# Patient Record
Sex: Male | Born: 1935 | Race: Black or African American | Hispanic: No | Marital: Married | Smoking: Former smoker
Health system: Southern US, Community
[De-identification: ages and names within clinical notes are randomized; demographics above are authoritative.]

## PROBLEM LIST (undated history)

## (undated) DIAGNOSIS — C499 Malignant neoplasm of connective and soft tissue, unspecified: Principal | ICD-10-CM

## (undated) DIAGNOSIS — I10 Essential (primary) hypertension: Secondary | ICD-10-CM

## (undated) DIAGNOSIS — I509 Heart failure, unspecified: Secondary | ICD-10-CM

## (undated) DIAGNOSIS — E1142 Type 2 diabetes mellitus with diabetic polyneuropathy: Secondary | ICD-10-CM

## (undated) DIAGNOSIS — Z800 Family history of malignant neoplasm of digestive organs: Secondary | ICD-10-CM

## (undated) DIAGNOSIS — E11319 Type 2 diabetes mellitus with unspecified diabetic retinopathy without macular edema: Secondary | ICD-10-CM

## (undated) DIAGNOSIS — I2510 Atherosclerotic heart disease of native coronary artery without angina pectoris: Secondary | ICD-10-CM

## (undated) DIAGNOSIS — R1310 Dysphagia, unspecified: Secondary | ICD-10-CM

## (undated) DIAGNOSIS — L89309 Pressure ulcer of unspecified buttock, unspecified stage: Secondary | ICD-10-CM

## (undated) DIAGNOSIS — K8020 Calculus of gallbladder without cholecystitis without obstruction: Secondary | ICD-10-CM

## (undated) DIAGNOSIS — I739 Peripheral vascular disease, unspecified: Secondary | ICD-10-CM

## (undated) DIAGNOSIS — E538 Deficiency of other specified B group vitamins: Secondary | ICD-10-CM

## (undated) DIAGNOSIS — C6290 Malignant neoplasm of unspecified testis, unspecified whether descended or undescended: Secondary | ICD-10-CM

## (undated) DIAGNOSIS — M869 Osteomyelitis, unspecified: Secondary | ICD-10-CM

## (undated) HISTORY — DX: Malignant neoplasm of connective and soft tissue, unspecified: C49.9

## (undated) HISTORY — DX: Malignant neoplasm of unspecified testis, unspecified whether descended or undescended: C62.90

## (undated) HISTORY — DX: Type 2 diabetes mellitus with diabetic polyneuropathy: E11.42

## (undated) HISTORY — DX: Type 2 diabetes mellitus with unspecified diabetic retinopathy without macular edema: E11.319

## (undated) HISTORY — DX: Peripheral vascular disease, unspecified: I73.9

## (undated) HISTORY — DX: Pressure ulcer of unspecified buttock, unspecified stage: L89.309

## (undated) HISTORY — DX: Deficiency of other specified B group vitamins: E53.8

## (undated) HISTORY — DX: Family history of malignant neoplasm of digestive organs: Z80.0

## (undated) HISTORY — DX: Heart failure, unspecified: I50.9

## (undated) HISTORY — DX: Calculus of gallbladder without cholecystitis without obstruction: K80.20

## (undated) HISTORY — PX: PTCA: SHX146

## (undated) HISTORY — DX: Atherosclerotic heart disease of native coronary artery without angina pectoris: I25.10

## (undated) HISTORY — DX: Osteomyelitis, unspecified: M86.9

## (undated) HISTORY — PX: APPENDECTOMY: SHX54

---

## 1997-11-11 ENCOUNTER — Encounter: Admission: RE | Admit: 1997-11-11 | Discharge: 1997-11-11 | Payer: Self-pay | Admitting: Hematology and Oncology

## 2001-01-08 ENCOUNTER — Encounter: Payer: Self-pay | Admitting: Sports Medicine

## 2001-01-08 ENCOUNTER — Encounter: Payer: Self-pay | Admitting: Emergency Medicine

## 2001-01-08 ENCOUNTER — Inpatient Hospital Stay (HOSPITAL_COMMUNITY): Admission: EM | Admit: 2001-01-08 | Discharge: 2001-01-11 | Payer: Self-pay | Admitting: Emergency Medicine

## 2001-01-10 ENCOUNTER — Encounter: Payer: Self-pay | Admitting: Sports Medicine

## 2001-01-15 ENCOUNTER — Encounter: Admission: RE | Admit: 2001-01-15 | Discharge: 2001-01-15 | Payer: Self-pay | Admitting: Sports Medicine

## 2001-01-22 ENCOUNTER — Encounter: Admission: RE | Admit: 2001-01-22 | Discharge: 2001-01-22 | Payer: Self-pay | Admitting: Family Medicine

## 2001-01-22 ENCOUNTER — Encounter (INDEPENDENT_AMBULATORY_CARE_PROVIDER_SITE_OTHER): Payer: Self-pay

## 2001-01-22 ENCOUNTER — Other Ambulatory Visit: Admission: RE | Admit: 2001-01-22 | Discharge: 2001-01-22 | Payer: Self-pay | Admitting: Urology

## 2001-02-20 ENCOUNTER — Encounter (INDEPENDENT_AMBULATORY_CARE_PROVIDER_SITE_OTHER): Payer: Self-pay

## 2001-02-20 ENCOUNTER — Inpatient Hospital Stay (HOSPITAL_COMMUNITY): Admission: RE | Admit: 2001-02-20 | Discharge: 2001-02-24 | Payer: Self-pay | Admitting: Urology

## 2003-02-11 ENCOUNTER — Encounter: Admission: RE | Admit: 2003-02-11 | Discharge: 2003-02-11 | Payer: Self-pay | Admitting: General Practice

## 2003-02-11 ENCOUNTER — Encounter: Payer: Self-pay | Admitting: General Practice

## 2003-08-09 HISTORY — PX: BELOW KNEE LEG AMPUTATION: SUR23

## 2003-11-09 ENCOUNTER — Emergency Department (HOSPITAL_COMMUNITY): Admission: EM | Admit: 2003-11-09 | Discharge: 2003-11-09 | Payer: Self-pay | Admitting: Emergency Medicine

## 2003-12-03 ENCOUNTER — Ambulatory Visit (HOSPITAL_COMMUNITY): Admission: RE | Admit: 2003-12-03 | Discharge: 2003-12-03 | Payer: Self-pay | Admitting: Vascular Surgery

## 2003-12-03 ENCOUNTER — Encounter: Payer: Self-pay | Admitting: Vascular Surgery

## 2003-12-05 ENCOUNTER — Ambulatory Visit (HOSPITAL_COMMUNITY): Admission: RE | Admit: 2003-12-05 | Discharge: 2003-12-05 | Payer: Self-pay | Admitting: Vascular Surgery

## 2004-03-12 ENCOUNTER — Inpatient Hospital Stay (HOSPITAL_COMMUNITY): Admission: EM | Admit: 2004-03-12 | Discharge: 2004-03-19 | Payer: Self-pay | Admitting: Emergency Medicine

## 2004-03-13 ENCOUNTER — Encounter (INDEPENDENT_AMBULATORY_CARE_PROVIDER_SITE_OTHER): Payer: Self-pay | Admitting: *Deleted

## 2004-03-16 ENCOUNTER — Encounter (INDEPENDENT_AMBULATORY_CARE_PROVIDER_SITE_OTHER): Payer: Self-pay | Admitting: *Deleted

## 2004-03-19 ENCOUNTER — Inpatient Hospital Stay (HOSPITAL_COMMUNITY)
Admission: RE | Admit: 2004-03-19 | Discharge: 2004-03-30 | Payer: Self-pay | Admitting: Physical Medicine & Rehabilitation

## 2004-04-27 ENCOUNTER — Ambulatory Visit (HOSPITAL_COMMUNITY): Admission: RE | Admit: 2004-04-27 | Discharge: 2004-04-27 | Payer: Self-pay | Admitting: Vascular Surgery

## 2004-04-29 ENCOUNTER — Inpatient Hospital Stay (HOSPITAL_COMMUNITY): Admission: RE | Admit: 2004-04-29 | Discharge: 2004-05-04 | Payer: Self-pay | Admitting: Vascular Surgery

## 2004-04-29 ENCOUNTER — Ambulatory Visit: Payer: Self-pay | Admitting: Physical Medicine & Rehabilitation

## 2004-05-04 ENCOUNTER — Inpatient Hospital Stay (HOSPITAL_COMMUNITY)
Admission: RE | Admit: 2004-05-04 | Discharge: 2004-05-12 | Payer: Self-pay | Admitting: Physical Medicine & Rehabilitation

## 2004-05-04 ENCOUNTER — Ambulatory Visit: Payer: Self-pay | Admitting: Physical Medicine & Rehabilitation

## 2004-05-21 ENCOUNTER — Encounter
Admission: RE | Admit: 2004-05-21 | Discharge: 2004-08-19 | Payer: Self-pay | Admitting: Physical Medicine & Rehabilitation

## 2004-05-25 ENCOUNTER — Ambulatory Visit: Payer: Self-pay | Admitting: Physical Medicine & Rehabilitation

## 2004-05-25 ENCOUNTER — Encounter
Admission: RE | Admit: 2004-05-25 | Discharge: 2004-05-25 | Payer: Self-pay | Admitting: Physical Medicine & Rehabilitation

## 2004-07-12 ENCOUNTER — Encounter
Admission: RE | Admit: 2004-07-12 | Discharge: 2004-10-10 | Payer: Self-pay | Admitting: Physical Medicine & Rehabilitation

## 2004-07-27 ENCOUNTER — Ambulatory Visit: Payer: Self-pay | Admitting: Physical Medicine & Rehabilitation

## 2004-08-08 HISTORY — PX: BELOW KNEE LEG AMPUTATION: SUR23

## 2004-10-11 ENCOUNTER — Encounter
Admission: RE | Admit: 2004-10-11 | Discharge: 2004-12-02 | Payer: Self-pay | Admitting: Physical Medicine & Rehabilitation

## 2005-05-03 ENCOUNTER — Inpatient Hospital Stay (HOSPITAL_COMMUNITY): Admission: EM | Admit: 2005-05-03 | Discharge: 2005-05-08 | Payer: Self-pay | Admitting: Emergency Medicine

## 2005-05-10 ENCOUNTER — Emergency Department (HOSPITAL_COMMUNITY): Admission: EM | Admit: 2005-05-10 | Discharge: 2005-05-10 | Payer: Self-pay | Admitting: Emergency Medicine

## 2005-05-18 ENCOUNTER — Inpatient Hospital Stay (HOSPITAL_COMMUNITY): Admission: EM | Admit: 2005-05-18 | Discharge: 2005-05-19 | Payer: Self-pay | Admitting: Emergency Medicine

## 2005-05-20 ENCOUNTER — Inpatient Hospital Stay (HOSPITAL_COMMUNITY): Admission: EM | Admit: 2005-05-20 | Discharge: 2005-05-21 | Payer: Self-pay | Admitting: Emergency Medicine

## 2005-07-04 ENCOUNTER — Ambulatory Visit: Payer: Self-pay | Admitting: Physical Medicine & Rehabilitation

## 2005-07-04 ENCOUNTER — Inpatient Hospital Stay (HOSPITAL_COMMUNITY): Admission: RE | Admit: 2005-07-04 | Discharge: 2005-07-11 | Payer: Self-pay | Admitting: Vascular Surgery

## 2005-07-04 ENCOUNTER — Encounter (INDEPENDENT_AMBULATORY_CARE_PROVIDER_SITE_OTHER): Payer: Self-pay | Admitting: Specialist

## 2005-07-11 ENCOUNTER — Inpatient Hospital Stay (HOSPITAL_COMMUNITY)
Admission: RE | Admit: 2005-07-11 | Discharge: 2005-07-20 | Payer: Self-pay | Admitting: Physical Medicine & Rehabilitation

## 2005-07-11 ENCOUNTER — Ambulatory Visit: Payer: Self-pay | Admitting: Physical Medicine & Rehabilitation

## 2005-11-24 ENCOUNTER — Ambulatory Visit: Payer: Self-pay | Admitting: Family Medicine

## 2005-12-01 ENCOUNTER — Ambulatory Visit: Payer: Self-pay | Admitting: Family Medicine

## 2005-12-15 ENCOUNTER — Ambulatory Visit: Payer: Self-pay | Admitting: Family Medicine

## 2006-01-10 ENCOUNTER — Encounter
Admission: RE | Admit: 2006-01-10 | Discharge: 2006-04-10 | Payer: Self-pay | Admitting: Physical Medicine & Rehabilitation

## 2006-02-21 ENCOUNTER — Ambulatory Visit: Payer: Self-pay | Admitting: Physical Medicine & Rehabilitation

## 2006-02-27 ENCOUNTER — Encounter
Admission: RE | Admit: 2006-02-27 | Discharge: 2006-02-28 | Payer: Self-pay | Admitting: Physical Medicine & Rehabilitation

## 2007-09-11 ENCOUNTER — Encounter
Admission: RE | Admit: 2007-09-11 | Discharge: 2007-09-12 | Payer: Self-pay | Admitting: Physical Medicine & Rehabilitation

## 2007-09-11 ENCOUNTER — Ambulatory Visit: Payer: Self-pay | Admitting: Physical Medicine & Rehabilitation

## 2007-10-04 ENCOUNTER — Encounter
Admission: RE | Admit: 2007-10-04 | Discharge: 2007-12-25 | Payer: Self-pay | Admitting: Physical Medicine & Rehabilitation

## 2007-11-23 ENCOUNTER — Encounter: Payer: Self-pay | Admitting: Gastroenterology

## 2007-12-05 ENCOUNTER — Emergency Department (HOSPITAL_COMMUNITY): Admission: EM | Admit: 2007-12-05 | Discharge: 2007-12-05 | Payer: Self-pay | Admitting: Emergency Medicine

## 2007-12-07 ENCOUNTER — Encounter: Admission: RE | Admit: 2007-12-07 | Discharge: 2007-12-07 | Payer: Self-pay | Admitting: Internal Medicine

## 2007-12-21 DIAGNOSIS — I5022 Chronic systolic (congestive) heart failure: Secondary | ICD-10-CM

## 2007-12-21 DIAGNOSIS — I739 Peripheral vascular disease, unspecified: Secondary | ICD-10-CM

## 2007-12-21 DIAGNOSIS — E119 Type 2 diabetes mellitus without complications: Secondary | ICD-10-CM

## 2007-12-21 DIAGNOSIS — E785 Hyperlipidemia, unspecified: Secondary | ICD-10-CM

## 2007-12-21 DIAGNOSIS — I10 Essential (primary) hypertension: Secondary | ICD-10-CM | POA: Insufficient documentation

## 2007-12-21 DIAGNOSIS — I2510 Atherosclerotic heart disease of native coronary artery without angina pectoris: Secondary | ICD-10-CM | POA: Insufficient documentation

## 2007-12-21 DIAGNOSIS — L97909 Non-pressure chronic ulcer of unspecified part of unspecified lower leg with unspecified severity: Secondary | ICD-10-CM | POA: Insufficient documentation

## 2007-12-21 DIAGNOSIS — K8020 Calculus of gallbladder without cholecystitis without obstruction: Secondary | ICD-10-CM | POA: Insufficient documentation

## 2007-12-21 DIAGNOSIS — E538 Deficiency of other specified B group vitamins: Secondary | ICD-10-CM | POA: Insufficient documentation

## 2007-12-21 DIAGNOSIS — E1149 Type 2 diabetes mellitus with other diabetic neurological complication: Secondary | ICD-10-CM

## 2007-12-25 ENCOUNTER — Ambulatory Visit: Payer: Self-pay | Admitting: Gastroenterology

## 2007-12-25 DIAGNOSIS — K921 Melena: Secondary | ICD-10-CM

## 2007-12-25 DIAGNOSIS — R1012 Left upper quadrant pain: Secondary | ICD-10-CM | POA: Insufficient documentation

## 2007-12-26 ENCOUNTER — Encounter: Admission: RE | Admit: 2007-12-26 | Discharge: 2007-12-26 | Payer: Self-pay | Admitting: Internal Medicine

## 2007-12-28 ENCOUNTER — Encounter: Payer: Self-pay | Admitting: Gastroenterology

## 2007-12-28 ENCOUNTER — Ambulatory Visit: Payer: Self-pay | Admitting: Gastroenterology

## 2007-12-28 ENCOUNTER — Encounter (INDEPENDENT_AMBULATORY_CARE_PROVIDER_SITE_OTHER): Payer: Self-pay | Admitting: *Deleted

## 2008-01-07 ENCOUNTER — Encounter: Payer: Self-pay | Admitting: Gastroenterology

## 2008-01-29 ENCOUNTER — Ambulatory Visit: Payer: Self-pay | Admitting: Gastroenterology

## 2008-01-29 DIAGNOSIS — K2990 Gastroduodenitis, unspecified, without bleeding: Secondary | ICD-10-CM

## 2008-01-29 DIAGNOSIS — K2970 Gastritis, unspecified, without bleeding: Secondary | ICD-10-CM | POA: Insufficient documentation

## 2008-09-10 ENCOUNTER — Telehealth: Payer: Self-pay | Admitting: Gastroenterology

## 2009-07-09 ENCOUNTER — Telehealth: Payer: Self-pay | Admitting: Gastroenterology

## 2010-08-08 HISTORY — PX: EYE SURGERY: SHX253

## 2010-08-29 ENCOUNTER — Encounter: Payer: Self-pay | Admitting: Vascular Surgery

## 2010-08-29 ENCOUNTER — Encounter: Payer: Self-pay | Admitting: Internal Medicine

## 2010-09-07 NOTE — Progress Notes (Signed)
Summary: RX  Phone Note Call from Patient Call back at [phone]   Caller: Spouse Call For: [name]  Reason for Call: Talk to Nurse Details for Reason: RX  Summary of Call: can Dr prescb more pain pill? per pharm no refill needs new RX -Procoxythen... [facility]  [address] #[id number]* Initial call taken by: Guadlupe [facility],  September 10, 2008 10:42 AM  Follow-up for Phone Call        the patient is taking the pain meds for his legs, wife states that Dr. Jarold Motto gve it to her husband because he can not take NSAIDS. Is it ok to refill patient seen last in 01-2008. Follow-up by: Harlow Mares CMA,  September 10, 2008 12:03 PM  Additional Follow-up for Phone Call Additional follow up Details #1::        Darvocet is ok Additional Follow-up by: Mardella Layman MD [facility],  September 10, 2008 12:12 PM    Additional Follow-up for Phone Call Additional follow up Details #2::    pateint wife aware. rx to be faxed. Follow-up by: Harlow Mares CMA,  September 10, 2008 4:40 PM  New/Updated Medications: DARVOCET-N 100 100-650 MG TABS (PROPOXYPHENE N-APAP) take one by mouth every 6 hours as needed   Prescriptions: DARVOCET-N 100 100-650 MG TABS (PROPOXYPHENE N-APAP) take one by mouth every 6 hours as needed  #180 x 0   Entered by:   Harlow Mares CMA   Authorized by:   Mardella Layman MD FACG,FAGA   Signed by:   Harlow Mares CMA on 09/10/2008   Method used:   Printed then faxed to ...       [facility]  [address] [phone]* (retail)       [address]       [place], [place]  [address]       Ph: [phone] or [phone]       Fax: [phone]   RxID:   [id number]

## 2010-10-20 ENCOUNTER — Other Ambulatory Visit: Payer: Self-pay | Admitting: Urology

## 2010-10-20 ENCOUNTER — Encounter (HOSPITAL_COMMUNITY): Payer: Medicare Other

## 2010-10-20 ENCOUNTER — Ambulatory Visit (HOSPITAL_COMMUNITY)
Admission: RE | Admit: 2010-10-20 | Discharge: 2010-10-20 | Disposition: A | Discharge status: Home / Self Care | Payer: Medicare Other | Source: Ambulatory Visit | Attending: Urology | Admitting: Urology

## 2010-10-20 DIAGNOSIS — E119 Type 2 diabetes mellitus without complications: Secondary | ICD-10-CM | POA: Insufficient documentation

## 2010-10-20 DIAGNOSIS — Z01818 Encounter for other preprocedural examination: Secondary | ICD-10-CM | POA: Insufficient documentation

## 2010-10-20 DIAGNOSIS — I10 Essential (primary) hypertension: Secondary | ICD-10-CM | POA: Insufficient documentation

## 2010-10-20 DIAGNOSIS — Z87891 Personal history of nicotine dependence: Secondary | ICD-10-CM | POA: Insufficient documentation

## 2010-10-20 DIAGNOSIS — Z01812 Encounter for preprocedural laboratory examination: Secondary | ICD-10-CM | POA: Insufficient documentation

## 2010-10-20 DIAGNOSIS — J9819 Other pulmonary collapse: Secondary | ICD-10-CM | POA: Insufficient documentation

## 2010-10-20 LAB — CBC
HCT: 43 % (ref 39.0–52.0)
MCHC: 32.6 g/dL (ref 30.0–36.0)
MCV: 96.4 fL (ref 78.0–100.0)
Platelets: 206 10*3/uL (ref 150–400)
RDW: 13.4 % (ref 11.5–15.5)
WBC: 8.8 10*3/uL (ref 4.0–10.5)

## 2010-10-20 LAB — BASIC METABOLIC PANEL
BUN: 16 mg/dL (ref 6–23)
Calcium: 8.4 mg/dL (ref 8.4–10.5)
GFR calc non Af Amer: >60 mL/min (ref >60)
Glucose, Bld: 117 mg/dL — ABNORMAL HIGH (ref 70–99)
Potassium: 3.9 mEq/L (ref 3.5–5.1)
Sodium: 140 mEq/L (ref 135–145)

## 2010-10-20 LAB — SURGICAL PCR SCREEN: Staphylococcus aureus: POSITIVE — AB

## 2010-11-02 ENCOUNTER — Observation Stay (HOSPITAL_COMMUNITY)
Admission: RE | Admit: 2010-11-02 | Discharge: 2010-11-03 | Disposition: A | Discharge status: Home / Self Care | Payer: Medicare Other | Source: Ambulatory Visit | Attending: Urology | Admitting: Urology

## 2010-11-02 ENCOUNTER — Other Ambulatory Visit: Payer: Self-pay | Admitting: Urology

## 2010-11-02 DIAGNOSIS — I10 Essential (primary) hypertension: Secondary | ICD-10-CM | POA: Insufficient documentation

## 2010-11-02 DIAGNOSIS — R599 Enlarged lymph nodes, unspecified: Secondary | ICD-10-CM | POA: Insufficient documentation

## 2010-11-02 DIAGNOSIS — Z79899 Other long term (current) drug therapy: Secondary | ICD-10-CM | POA: Insufficient documentation

## 2010-11-02 DIAGNOSIS — E119 Type 2 diabetes mellitus without complications: Secondary | ICD-10-CM | POA: Insufficient documentation

## 2010-11-02 DIAGNOSIS — I2510 Atherosclerotic heart disease of native coronary artery without angina pectoris: Secondary | ICD-10-CM | POA: Insufficient documentation

## 2010-11-02 DIAGNOSIS — N508 Other specified disorders of male genital organs: Secondary | ICD-10-CM | POA: Insufficient documentation

## 2010-11-02 DIAGNOSIS — S88919A Complete traumatic amputation of unspecified lower leg, level unspecified, initial encounter: Secondary | ICD-10-CM | POA: Insufficient documentation

## 2010-11-02 DIAGNOSIS — I252 Old myocardial infarction: Secondary | ICD-10-CM | POA: Insufficient documentation

## 2010-11-02 DIAGNOSIS — Z7982 Long term (current) use of aspirin: Secondary | ICD-10-CM | POA: Insufficient documentation

## 2010-11-02 DIAGNOSIS — Z794 Long term (current) use of insulin: Secondary | ICD-10-CM | POA: Insufficient documentation

## 2010-11-02 DIAGNOSIS — I70209 Unspecified atherosclerosis of native arteries of extremities, unspecified extremity: Secondary | ICD-10-CM | POA: Insufficient documentation

## 2010-11-02 DIAGNOSIS — K219 Gastro-esophageal reflux disease without esophagitis: Secondary | ICD-10-CM | POA: Insufficient documentation

## 2010-11-02 DIAGNOSIS — E669 Obesity, unspecified: Secondary | ICD-10-CM | POA: Insufficient documentation

## 2010-11-02 DIAGNOSIS — C632 Malignant neoplasm of scrotum: Principal | ICD-10-CM | POA: Insufficient documentation

## 2010-11-02 DIAGNOSIS — Z01812 Encounter for preprocedural laboratory examination: Secondary | ICD-10-CM | POA: Insufficient documentation

## 2010-11-02 LAB — GLUCOSE, CAPILLARY
Glucose-Capillary: 148 mg/dL — ABNORMAL HIGH (ref 70–99)
Glucose-Capillary: 162 mg/dL — ABNORMAL HIGH (ref 70–99)

## 2010-11-03 LAB — GLUCOSE, CAPILLARY

## 2010-11-09 NOTE — Op Note (Signed)
[name], [name]               ACCOUNT NO.:  [id number]  MEDICAL RECORD NO.:  [id number]           PATIENT TYPE:  O  LOCATION:  1433                         FACILITY:  [facility]  PHYSICIAN:  Excell Seltzer. Annabell Howells, M.D.    DATE OF BIRTH:  [birth year]  DATE OF PROCEDURE:  11/03/2010 DATE OF DISCHARGE:                              OPERATIVE REPORT   PROCEDURE:  Right scrotal and inguinal exploration with right radical orchiectomy and right inguinal lymphadenectomy.  PREOPERATIVE DIAGNOSIS:  Right scrotal mass.  POSTOPERATIVE DIAGNOSIS:  Right scrotal mass with right inguinal adenopathy.  SURGEON:  Excell Seltzer. Annabell Howells, M.D.  ANESTHESIA:  General.  SPECIMEN:  Right scrotal mass, testicle, and enlarged right inguinal lymph node.  DRAINS:  None.  BLOOD LOSS:  25 cc.  COMPLICATIONS:  None.  INDICATIONS:  [name] is a 77 year old African-American male who was sent for enlarging right scrotal mass that began 2 months ago.  On CT, he was found to have a fatty mass in the scrotum with the testicle adjacent.  There was also a solid-appearing mass in the upper scrotum/inguinal area.  It was felt the resection was indicated.  FINDINGS AND PROCEDURE:  He was taken to the operating room where general anesthetic was induced.  His genitalia was clipped.  He was prepped with ChloraPrep on the lower abdomen and thighs and Betadine on the genitals.  He was then draped in usual sterile fashion.  Time-out was performed.  He had received Ancef for the procedure.  PAS hose were not placed since she is a bilateral amputee.  A right inguinal incision was made and this was extended down on to the anterior scrotum.  Once incision was made, the Bovie was used to open the subcutaneous tissues. The right scrotal mass was densely adherent to the surrounding tissues and basically had to carve it out to free it up.  On palpation in the inguinal area, there was a very large, approximately 6 cm, mass consistent  with adenopathy.  Once the scrotal mass had been freed from the surrounding scrotal tissues, I was able to elevate the testicle and dissect posterior to the inguinal mass.  Once the inguinal mass and cord were dissected back to the internal ring, the external oblique fascia was not readily visualized because of the adherence of the mass to the surrounding tissues.  The ilioinguinal nerve was never identified, but once the mass was elevated, I was able to determine that the lymph node was adjacent to and not involving the actual cord and the testicle was actually adjacent to the mass as well.  However, due to the adherence, it was felt that a radical orchiectomy needed to be performed.  The pedicle was dissected out, divided into 2 packets, clamped and divided. I was then able to get a right-angle clamp beneath the lymph node and clamped the lymphatic channels.  The lymph node was then released from the inguinal floor and the specimen was removed.  Suture ligature with 2- 0 Vicryl followed by 0 Vicryl free ties were used to control the vascular and lymphatic pedicles.  Once the sutures were  in place, the inguinal canal was inspected and what appeared to be the external oblique fascia was reapproximated using a running 3-0 Vicryl suture. The scrotum was inspected.  A few small bleeders were cauterized.  Once hemostasis was assured, the dartos was closed using a running 3-0 chromic suture.  Interrupted 3-0 chromic were used to close the subcutaneous tissue and the inguinal component.  Interrupted vertical mattress 3-0 chromic was used to close the scrotal skin.  This was followed by skin clips in the inguinal area.  A dressing of 4x4s and a scrotal support was placed.  The patient's anesthetic was reversed.  He was moved to the recovery room in stable condition.  There were no complications.     Excell Seltzer. Annabell Howells, M.D.     JJW/MEDQ  D:  11/03/2010  T:  11/03/2010  Job:  [id number]  cc:    [name], M.D. Fax: [phone]  Electronically Signed by Bjorn Pippin M.D. on 11/09/2010 07:08:57 PM

## 2010-11-14 ENCOUNTER — Emergency Department (HOSPITAL_COMMUNITY)
Admission: EM | Admit: 2010-11-14 | Discharge: 2010-11-14 | Disposition: A | Discharge status: Home / Self Care | Payer: Medicare Other | Attending: Emergency Medicine | Admitting: Emergency Medicine

## 2010-11-14 ENCOUNTER — Emergency Department (HOSPITAL_COMMUNITY): Payer: Medicare Other

## 2010-11-14 DIAGNOSIS — I10 Essential (primary) hypertension: Secondary | ICD-10-CM | POA: Insufficient documentation

## 2010-11-14 DIAGNOSIS — S3130XA Unspecified open wound of scrotum and testes, initial encounter: Secondary | ICD-10-CM | POA: Insufficient documentation

## 2010-11-14 DIAGNOSIS — Z9079 Acquired absence of other genital organ(s): Secondary | ICD-10-CM | POA: Insufficient documentation

## 2010-11-14 DIAGNOSIS — E119 Type 2 diabetes mellitus without complications: Secondary | ICD-10-CM | POA: Insufficient documentation

## 2010-11-14 DIAGNOSIS — Z794 Long term (current) use of insulin: Secondary | ICD-10-CM | POA: Insufficient documentation

## 2010-11-14 DIAGNOSIS — N501 Vascular disorders of male genital organs: Secondary | ICD-10-CM | POA: Insufficient documentation

## 2010-11-14 LAB — DIFFERENTIAL
Basophils Absolute: 0 10*3/uL (ref 0.0–0.1)
Eosinophils Relative: 2 % (ref 0–5)
Lymphocytes Relative: 16 % (ref 12–46)
Lymphs Abs: 1.6 10*3/uL (ref 0.7–4.0)
Monocytes Absolute: 0.6 10*3/uL (ref 0.1–1.0)
Monocytes Relative: 6 % (ref 3–12)
Neutro Abs: 7.5 10*3/uL (ref 1.7–7.7)

## 2010-11-14 LAB — CBC
HCT: 36.6 % — ABNORMAL LOW (ref 39.0–52.0)
Hemoglobin: 12.1 g/dL — ABNORMAL LOW (ref 13.0–17.0)
MCH: 31.6 pg (ref 26.0–34.0)
MCHC: 33.1 g/dL (ref 30.0–36.0)
MCV: 95.6 fL (ref 78.0–100.0)
RDW: 13 % (ref 11.5–15.5)

## 2010-11-14 LAB — BASIC METABOLIC PANEL
BUN: 10 mg/dL (ref 6–23)
CO2: 26 mEq/L (ref 19–32)
Chloride: 101 mEq/L (ref 96–112)
GFR calc non Af Amer: >60 mL/min (ref >60)
Glucose, Bld: 317 mg/dL — ABNORMAL HIGH (ref 70–99)
Potassium: 3.7 mEq/L (ref 3.5–5.1)
Sodium: 135 mEq/L (ref 135–145)

## 2010-12-02 ENCOUNTER — Other Ambulatory Visit: Payer: Self-pay | Admitting: Oncology

## 2010-12-02 ENCOUNTER — Encounter (HOSPITAL_BASED_OUTPATIENT_CLINIC_OR_DEPARTMENT_OTHER): Payer: Medicare Other | Admitting: Oncology

## 2010-12-02 DIAGNOSIS — E119 Type 2 diabetes mellitus without complications: Secondary | ICD-10-CM

## 2010-12-02 DIAGNOSIS — C6290 Malignant neoplasm of unspecified testis, unspecified whether descended or undescended: Secondary | ICD-10-CM

## 2010-12-02 DIAGNOSIS — Z794 Long term (current) use of insulin: Secondary | ICD-10-CM

## 2010-12-02 DIAGNOSIS — I10 Essential (primary) hypertension: Secondary | ICD-10-CM

## 2010-12-02 LAB — CBC WITH DIFFERENTIAL/PLATELET
BASO%: 0.6 % (ref 0.0–2.0)
Basophils Absolute: 0 10*3/uL (ref 0.0–0.1)
EOS%: 2.7 % (ref 0.0–7.0)
HCT: 35.9 % — ABNORMAL LOW (ref 38.4–49.9)
HGB: 12.1 g/dL — ABNORMAL LOW (ref 13.0–17.1)
LYMPH%: 22.6 % (ref 14.0–49.0)
MCH: 32.4 pg (ref 27.2–33.4)
MCHC: 33.7 g/dL (ref 32.0–36.0)
NEUT%: 67.9 % (ref 39.0–75.0)
Platelets: 262 10*3/uL (ref 140–400)
lymph#: 1.7 10*3/uL (ref 0.9–3.3)

## 2010-12-04 LAB — COMPREHENSIVE METABOLIC PANEL
ALT: <8 U/L (ref 0–53)
BUN: 14 mg/dL (ref 6–23)
CO2: 23 mEq/L (ref 19–32)
Calcium: 8.5 mg/dL (ref 8.4–10.5)
Chloride: 103 mEq/L (ref 96–112)
Creatinine, Ser: 1.19 mg/dL (ref 0.40–1.50)
Total Bilirubin: 0.4 mg/dL (ref 0.3–1.2)

## 2010-12-04 LAB — LACTATE DEHYDROGENASE: LDH: 143 U/L (ref 94–250)

## 2010-12-04 LAB — BETA HCG QUANT (REF LAB): Beta hCG, Tumor Marker: 0.6 m[IU]/mL (ref <5.0)

## 2010-12-06 ENCOUNTER — Encounter: Payer: Medicare Other | Admitting: Oncology

## 2010-12-21 NOTE — Assessment & Plan Note (Signed)
DATE OF EVALUATION:  09/12/2007   Mr. Edmonston returned to the clinic today requested by [facility].  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  We last saw  him in this office February 22, 2006.  At that time he planned to move to  [place] to live with his niece.  He did attempt to do that, but  found out that she was on drugs and that did not work out.  He returned  to live with his wife locally.   The patient has had his present prosthesis for at least 4 to 5 years.  He has poorly fitting prostheses bilaterally for the below knee  amputation sites.  He does use a manual wheelchair, and that also needs  to be replaced at present secondary to wear and tear.  He reports that  he ambulates occasionally throughout the day, but his wife reports that  he ambulates approximately monthly.  In spite of that, he is able to  stand and ambulate with [place] in the office today.  To get to the standing  position, he required 2+ moderate assist, but to ambulate he required 1+  min-assist.   ALLERGIES:  GLUCOPHAGE.   MEDICATIONS:  1. Zocor 40 mg daily.  2. Lasix 20 mg daily.  3. Glipizide 10 mg b.i.d.  4. Aspirin 325 mg daily.  5. Coreg 6.25 mg b.i.d.  6. Lisinopril 10 mg nightly.  7. Aldactone 25 mg nightly.  8. Plavix 75 mg daily.   REVIEW OF SYSTEMS:  Positive for high blood sugar.   PHYSICAL EXAMINATION:  Well appearing, large adult male seated in manual  wheelchair.  Blood pressure is 154/62 with pulse 79, respiratory rate  18, and O2 saturation 95% on room air.  He has 5-/5 strength in the  bilateral upper extremities and 4+/5 strength in hip flexion, knee  extension bilaterally.  Patient was able to ambulate as noted above  short distances.   IMPRESSION:  Status post bilateral below knee amputations with poorly  fitting prostheses  at present.   At the present time, we will set the patient up to see Trey Paula at [facility] for refitting for his bilateral below knee prostheses.  He  will be a K1 ambulator, i.e. household ambulation.  We have encouraged  outpatient therapies once he has his new prostheses.  We also gave him a  slip for a lightweight wheelchair to be obtained through [facility].  We will plan on seeing the patient in followup on an as needed  basis.           ______________________________  Ellwood Dense, M.D.     DC/MedQ  D:  09/12/2007 10:51:07  T:  09/12/2007 16:23:15  Job #:  [id number]

## 2010-12-24 NOTE — Op Note (Signed)
[name], [name] NO.:  [id number]   MEDICAL RECORD NO.:  [id number]          PATIENT TYPE:  INP   LOCATION:  2028                         FACILITY:  [facility]   PHYSICIAN:  Janetta Hora. Fields, MD  DATE OF BIRTH:  [birth year]   DATE OF PROCEDURE:  DATE OF DISCHARGE:                                 OPERATIVE REPORT   PROCEDURE:  Left below-knee amputation.   PREOPERATIVE DIAGNOSIS:  Gangrene left foot.   POSTOPERATIVE DIAGNOSIS:  Gangrene left foot.   ANESTHESIA:  General.   ASSISTANT:  Delight Hoh, RN   INDICATIONS:  The patient is a 77 year old male with unreconstructable  tibial artery occlusive disease. He has gangrene of the left foot.   OPERATIVE FINDINGS:  1.  Gangrene left leg.  2.  Severe tibial artery occlusive disease.  3.  Healthy bleeding below-knee tissues.   OPERATIVE DETAIL:  After obtaining informed consent, the patient was taken  to the operating room. The patient was placed in the supine position on the  operating table. After induction of general anesthesia and endotracheal  intubation, the Foley catheter was placed. Next the patient's left lower  extremity was prepped and draped in the usual sterile fashion. A transverse  incision was made approximately 4 fingerbreadths below the tibial  tuberosity. This was carried approximately halfway to the posterior portion  of the leg; and then extended towards the foot to create a posterior flap.  The skin incision was completed circumferentially.   Next, the fascia was incised with the cautery. The muscles were taken down  on the anterior compartment with cautery. The periosteum was then freed  circumferentially approximately 4 cm above the skin edge on the tibia. The  fibula was also dissected free with the periosteal elevator approximately 2  cm higher than this. The fibula was then divided with a bone cutter. The  tibia was divided with a saw. The anterior edge of the tibia was beveled  with a rasp. The tibia was then elevated up into the air with a bone hook,  and a posterior flap was created with an amputation knife. Hemostasis was  obtained with several silk ties and suture ligatures.   Next the posterior flap was fashioned so that it would fit well against the  anterior portion of the leg without tension. Fascia was reapproximated with  interrupted 2-0 Vicryl sutures. Subcutaneous tissues reapproximated with a  running  3-0 Vicryl stitch. Skin was closed with staples. The patient tolerated  procedure well and there were no complications.  Instrument, sponge, and  needle counts were correct at the end of the case.  The patient taken to the  recovery room in stable condition.           ______________________________  Janetta Hora Fields, MD     CEF/MEDQ  D:  07/04/2005  T:  07/04/2005  Job:  [id number]

## 2010-12-24 NOTE — H&P (Signed)
[name], [name] NO.:  [id number]   MEDICAL RECORD NO.:  [id number]          PATIENT TYPE:  INP   LOCATION:  1824                         FACILITY:  [facility]   PHYSICIAN:  Ulyses Amor, MD DATE OF BIRTH:  09-12-1932   DATE OF ADMISSION:  05/18/2005  DATE OF DISCHARGE:                                HISTORY & PHYSICAL   IDENTIFYING DATA AND CHIEF COMPLAINT:  [name] is a 77 year old black  man who is admitted to [facility] for further evaluation of chest  pain.   HISTORY OF THE PRESENT ILLNESS:  The patient has a history of cardiac  disease which dates back approximately two weeks. At that time he presented  with an acute posterior myocardial infarction.  He subsequently underwent  cardiac catheterization, which demonstrated significant three-vessel  disease.  A stent was placed to the first obtuse marginal, which was felt to  be the culprit vessel.  It was noted at that time the left ventricular  function was severely depressed with an ejection fraction estimated to be in  the range of 22-25%.   The patient returns to the emergency department tonight after experiencing  an episode of chest pain, which began last evening while he was watching  television.  The chest pain is exactly the same, which heralded his acute  myocardial infarction.  The chest pain is described as a lower substernal  pressure.  It radiated to his throat.  It is associated with dyspnea,  diaphoresis and nausea.  There are no exacerbating or ameliorating factors.  It appears not be related to position, activity, meals or respirations.  He  took one nitroglycerin tablet, which afforded him no relief.  He then  presented to the emergency department.  His chest pain has since resolved  with the treatment rendered.  He total duration of the chest pain was  appendectomy two hours.  The patient is free of chest pain and otherwise  asymptomatic at this time.   PAST MEDICAL  HISTORY:  The patient has a number of other medical problems.  He has diabetes mellitus. He also has hypertension and dyslipidemia.  He has  a history of peripheral vascular disease, having undergone left fem-pop  bypass and a right below the knee amputation.  He also has diabetic  neuropathy and diabetic retinopathy.   MEDICATIONS:  Digoxin, spironolactone, Plavix, Coreg, Zocor, Maxzide,  Glucotrol, lisinopril, and nitroglycerin.   ALLERGIES:  None.   PAST SURGICAL HISTORY:  1.  Left fem-pop bypass.  2.  Below the knee amputation.  3.  Appendectomy.  4.  Transurethral resection of the prostate.   SOCIAL HISTORY:  The patient does not work.  He lives with his wife.  He  does not drink.  There is a past __________.   Review of systems reveals no new problems related to her head, eyes, ears,  nose, mouth, throat, lungs, gastrointestinal system, genitourinary system,  or extremities.  There is no history of neurologic or psychiatric disorder.  There is no history of fever, chills or weight loss.   FAMILY HISTORY:  The family history is notable for diabetes mellitus in his  mother and __________ .   PHYSICAL EXAMINATION:  VITAL SIGNS:  Blood pressure 101/63, pulse 110 and  regular, respirations 18 and temperature 97.6.  GENERAL APPEARANCE:  The patient is an elderly black man in no discomfort.  He is alert, oriented and appropriate.  HEENT:  Head, eyes, nose and mouth are unremarkable.  NECK:  The neck is without thyromegaly or adenopathy.  Carotid pulses are  palpable bilaterally and without bruits.  HEART:  Cardiac examination reveals an normal S1 and S2.  There are no S3,  S4, murmur, rub or click.  Cardiac rhythm is regular.  CHEST:  No chest wall tenderness is noted.  LUNGS:  The lungs are clear.  ABDOMEN:  The abdomen is soft and nontender.  There is no mass,  hepatosplenomegaly, bruits, distention, rebound, guarding, or rigidity.  Bowel sounds are normal.  RECTAL AND  GENITALIA:  Rectal and genital examinations are not performed as  they are not pertinent to the reason for acute care hospitalization.  EXTREMITIES:  A right below the knee amputation is present.  The left foot  is mildly edematous.  Pulse cannot be palpated in the left foot.  NEUROLOGIC EXAMINATION:  Brief screening neurologic examination demonstrated  no focal neurologic findings.   LABORATORY DATA:  The electrocardiogram reveals evidence of a prior lateral  and inferoposterior myocardial infarctions. It is not significantly changed  since the prior tracing.  The chest radiograph report is pending at the time  of this dictation.  Potassium is 5.5 and BUN 21.  Initial cardiac markers  revealed a myoglobin of 193, CK/MB 1.5 and troponin less than 0.05.  The  second set of markers revealed a myoglobin of 115, CK/MB 1.5 and troponin  less than 0.05. The remaining studies are pending at the time of this  dictation.   IMPRESSION:  1.  Chest pain, rule out unstable angina.  2.  Coronary artery disease two weeks status post acute posterior myocardial      infarction resulting in stenting of the first obtuse marginal.  There      was residual significant three-vessel disease.  3.  Severe left ventricular dysfunction; ejection fraction estimated to  be      in the range of 20-25%.  4.  Hypertension.  5.  Diabetes mellitus.  6.  Peripheral vascular occlusive disease status post right below the knee      amputation.  7.  Status post left femoropopliteal bypass.  8.  Dyslipidemia.  9.  Diabetic neuropathy.  10. Diabetic retinopathy.   PLAN:  1.  Telemetry.  2.  Serial cardiac enzymes.  3.  Aspirin.  4.  Intravenous heparin.  5.  Intravenous nitroglycerin.  6.  Continue Plavix.  7.  Further measures per Dr. Jenne Campus.      Ulyses Amor, MD  Electronically Signed     MSC/MEDQ  D:  05/18/2005  T:  05/18/2005  Job:  [id number]

## 2010-12-24 NOTE — Discharge Summary (Signed)
[name], [name] NO.:  [id number]   MEDICAL RECORD NO.:  [id number]          PATIENT TYPE:  IPS   LOCATION:  4032                         FACILITY:  [facility]   PHYSICIAN:  [name], M.D.DATE OF BIRTH:  10-Apr-1933   DATE OF ADMISSION:  07/11/2005  DATE OF DISCHARGE:                                 DISCHARGE SUMMARY   ANTICIPATED DATE OF DISCHARGE:  July 20, 2005.   DISCHARGE DIAGNOSES:  1.  Left below knee amputation, July 04, 2005, secondary to dry      gangrene.  2.  Pain management.  3.  Subcutaneous heparin for deep vein thrombosis prophylaxis.  4.  Postoperative anemia.  5.  Renal insufficiency, resolved.  6.  Insulin-dependent diabetes mellitus.  7.  Hypertension.  8.  Hyperlipidemia.  9.  Right below knee amputation, August 2005.   HISTORY OF THE PRESENT ILLNESS:  This is a 77 year old black male with a  history of right below knee amputation in August 2005,  he received  inpatient rehab services as well as a left lower extremity femoropopliteal  bypass in September 2005 at which time he received subacute care in rehab  services.  He is now admitted July 04, 2005 with gangrene of the left  foot, with no change with conservative care and an unreconstructable tibial  artery.  The patient underwent left below knee amputation on July 04, 2005 per Dr. Darrick Penna. Subcutaneous heparin was added for deep vein thrombosis  prophylaxis.  The PCA was discontinued on July 09, 2005.  He is admitted  for a comprehensive rehab program.   PAST MEDICAL HISTORY:  For the past medical history please see the discharge  diagnoses.   HABITS:  No alcohol.  He does have a history of tobacco use.   ALLERGIES:  The patient's allergies are GLUCOPHAGE.   SOCIAL HISTORY:  The patient lives with his wife.  The wife can assist,  except for no lifting.  Local son works.  Patient lives in a one-level home  with three steps to entry.   MEDICATIONS:  The patient's medications prior to admission:  1.  Zocor 40 mg daily.  2.  Lasix 20 mg daily.  3.  Glipizide 10 mg twice a day.  4.  Aspirin 325 mg daily,  5.  Coreg 6.25 mg twice a day.  6.  Lisinopril 10 mg a bedtime.  7.  Aldactone 25 mg at bedtime.  8.  Plavix 75 mg daily.   HOSPITAL COURSE:  The patient with progressive gains while on rehab services  with therapies initiated om twice a day basis.  The following issues were  followed during the patient's rehab course.   Pertaining to [name]'s left below knee amputation of July 04, 2005;  the surgical site was healing nicely.  The staples were intact with the  ultimate plan to follow up with Dr. Darrick Penna of cardiothoracic surgery.  Pain  management was ongoing with the use of oxycodone and good results.  He  remained on subcutaneous heparin for deep vein thrombosis prophylaxis.  There was a noted history  of right below knee amputation in August 2005.  He  was using a prosthesis for his ambulation.   Postoperative anemia was stable with the latest hemoglobin of 8.8 and  hematocrit of 26.  His blood pressure was monitored.  He did have some mild  acute renal insufficiency with BUN of 44 and creatinine of 1.6.  He was  placed on intravenous fluids on July 13, 2005.  His lisinopril and Lasix  were on hold July 12, 2005.  Intravenous fluids were discontinued on  July 15, 2005 with BUN improving to 26 and creatinine to 1.4.  His  aldactone was decreased to 12.5 mg daily on July 18, 2005 and monitored  with follow up chemistries.  His blood sugars did have some increased  variables; 284, 80, 92 and 154.   On his admission to rehab services the patient was on glipizide 10 mg twice  a day as well as Lantus insulin; this was discontinued and he was placed on  NPH insulin of 10 units daily on July 18, 2005 as well as providing  family teaching for his diabetes at this time.  He would be on indulin   therapy at the time of discharge with ongoing issues to control his diet.  Overall for his functional status he is presently propelling his wheelchair  at supervision level, simple setup for activities of daily living and  minimal-to-moderate assistance to lower body.  He is continent of bowel and  bladder and he is minimal-to moderate assistance for bed-to-chair transfers.   The patient will be discharged to home with therapies.   DISCHARGE MEDICATIONS:  Discharge medications at the time of dictation  include:  1.  Glipizide 10 ,g twice a day.  2.  Zocor 40 mg daily.  3.  Ecotrin 325 mg daily.  4.  Coreg 6.25 mg twice a day.  5.  Plavix 75 mg daily.  6.  Nu-Iron 150 mg daily.  7.  Folic acid 1 mg daily.  8.  Aldactone 12,5 mg daily.  9.  NPH insulin10 units daily in the A.M.  10. Oxycodone as needed for breakthrough pain.  11. The patient's Lasix will remain on hold at 20 mg daily and lisinopril 10      mg daily also on hold as his renal function continues to improve.   DIET:  The patient's diet is regular.   WOUND CARE:  For wound care the patient is to follow up with Dr. Darrick Penna of  cardiovascular thoracic surgery for removal of staples in two to three week.  Arrangements will be made for him to follow up in the [facility].      Mariam Dollar, P.A.      [name], M.D.  Electronically Signed    DA/MEDQ  D:  07/19/2005  T:  07/20/2005  Job:  [id number]   cc:   [name] Hora. Fields, MD  [address], [place] [address]   [name], M.D.  Fax: [phone]

## 2010-12-24 NOTE — Consult Note (Signed)
[name], HOCKETT NO.:  [id number]   MEDICAL RECORD NO.:  [id number]          PATIENT TYPE:  INP   LOCATION:  2915                         FACILITY:  [facility]   PHYSICIAN:  [name], M.D.  DATE OF BIRTH:  March 19, 1933   DATE OF CONSULTATION:  05/04/2005  DATE OF DISCHARGE:                                   CONSULTATION   REASON FOR CONSULTATION:  Medical management of diabetes.   HISTORY OF PRESENT ILLNESS:  I greatly appreciate the cardiology response  and management in Mr. Macho's care.  He is a 77 year old African American  male with no prior history of coronary artery disease but does have severe  peripheral vascular disease status post right BKA and left fem/pop bypass  (September 2005) with chronic left foot ulcerations, type 2 diabetes, and  hyperlipidemia, who presented to the emergency department yesterday with a  prolonged episode of chest pain.  The patient describes waxing and waning  chest pain for greater than 12 hours that he attributed to food.  He  describes a lot of bloating and belching.  He had severe chest pain  associated with shortness of breath prompting him to come to the emergency  department where he was found to have an acute posterior MI.  He was taken  emergently to the catheterization lab last evening and found to have a 95%  OM1 and a 95% RCA lesion.  He underwent Cypher stenting with intervention on  the OM1 lesion.  His peak troponin was greater than 1000.  He has done  remarkably well following this procedure and is currently without complaint.  I have followed the patient for the past year.  He has a history of type 2  diabetes for about five years complicated by retinopathy and neuropathy.  He  has no known neuropathy with a negative urine microalbumin earlier this  year.  He has had well controlled diabetes with an A1C of 6.5 at our most  recent office visit on August 4.  This was previously 5.8.  He has been  maintained on sulfonylurea therapy due to Metformin intolerance due to  diarrhea.  His lipids have been improving with escalating doses of statin  therapy.  This has been hindered by his inability to pay for Vytorin.  His  LDL most recently was 82 on Zocor 40 mg.  The patient has been followed  closely by Dr. Fabienne Bruns for his peripheral vascular disease.  His left  foot ulcer has been improving, though still present.  Currently, the patient  is without complaint.  His chest pain has resolved.  He denies shortness of  breath.  He reports good sugar control at home.  He did run out of his  medications for one day and was planning to have them refilled.   REVIEW OF SYSTEMS:  All systems are reviewed and are negative except as in  the HPI.   PAST MEDICAL HISTORY:  1.  Peripheral vascular disease status post right BKA in August 2005.  2.  Status post left fem/pop bypass in September 2005 with chronic  left foot      ulcerations.  3.  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  4.  Hyperlipidemia.  5.  B12 deficiency.  6.  BPH status post TURP in 2004.  7.  Status post appendectomy.   MEDICATIONS AT HOME:  1.  Glipizide 5 mg b.i.d.  2.  Zocor 40 mg daily.  3.  Aspirin 325 mg daily.   ALLERGIES:  No known drug allergies.   SOCIAL HISTORY:  He lives with his wife, he has a history of alcohol use but  is using minimal amounts recently.  He has a smoking history but quit more  than ten years ago.   FAMILY HISTORY:  Significant for diabetes.   PHYSICAL EXAMINATION:  VITAL SIGNS:  Temperature 98.4, blood pressure 102/58, pulse 82,  respirations 22, oxygen saturation 95% on room air.  CBG was 136 fasting  this morning.  GENERAL:  He looks remarkably good in no acute distress.  HEENT:  Oropharynx is moist.  NECK:  Supple without lymphadenopathy or JVD.  No carotid bruits.  HEART:  Regular rate and rhythm without murmurs, gallops, and rubs.  LUNGS:  Clear to auscultation  bilaterally.  ABDOMEN:  Soft, nontender, nondistended, normal active bowel sounds.  EXTREMITIES:  He does have a right BKA.  Left chronic vascular changes with  atrophy.  Unable to palpate dorsalis pedes pulses which is a chronic issue.  The left toe ulcers between his second and third toes is actually improving.   LABORATORY DATA:  A1C 7.2.  CBC significant for white count 10.7, hemoglobin  12.8, platelets 181.  BMP unremarkable with BUN and creatinine of 9 and 1.1,  glucose 138.  AST 242, ALT 41, alkaline phos 105.  Troponin greater than  100, peak CK 5003 with a CK MB 6.4%/323.   ASSESSMENT/PLAN:  1.  Coronary artery disease status post acute posterior MI - continue post      intervention management per cardiology.  He is on beta blocker, ACE      inhibitor, statin, aspirin, and antiplatelet therapy.  Anticipating      staged intervention on right coronary artery.  Continue risk factor      modification after discharge.  2.  Type 2 diabetes - his A1C has increased slightly since his previous      visit.  Will titrate his Glipizide to 10 mg b.i.d. prior to discharge.      Will start with 5 mg b.i.d.  Continue sliding scale insulin.  Will      withhold TZD due to his heart disease until this is a stable issue.      Will hold Metformin due to GI intolerance.  May want to consider Bieta      as an outpatient to achieve an A1C of less than 6.5 and to help with      weight loss.  3.  Peripheral vascular disease - vascular studies have been ordered and are      pending.  Consider re-evaluation by Dr. Darrick Penna if this is a significant      change.  Prior notes document his ABIs in vascular studies.  4.  Hyperlipidemia - continue Zocor.  Will need to watch his LFTs to make      sure they are resolving.  5.  Elevated liver function tests - likely secondary to number 1.  Monitor      closely.  The patient denies recent alcohol.  Again, I am greatly appreciative of Dr.  McQueens efficient  management of his  acute coronary syndrome.  Will follow the patient closely with you.      [name], M.D.  Electronically Signed     WS/MEDQ  D:  05/04/2005  T:  05/04/2005  Job:  [id number]   cc:   Janetta Hora. Fields, MD  [address], [place] [address]   Darlin Priestly, MD  Fax: [phone]

## 2010-12-24 NOTE — Cardiovascular Report (Signed)
NAMECREEK, GAN NO.:  [id number]   MEDICAL RECORD NO.:  [id number]          PATIENT TYPE:  INP   LOCATION:  6529                         FACILITY:  [facility]   PHYSICIAN:  Darlin Priestly, MD  DATE OF BIRTH:  1933-02-28   DATE OF PROCEDURE:  05/18/2005  DATE OF DISCHARGE:                              CARDIAC CATHETERIZATION   PROCEDURES PERFORMED:  1.  Left heart catheterization.  2.  Coronary angiography.  3.  Left ventriculogram.  4.  Right coronary artery, mid.      1.  Percutaneous transluminal coronary balloon angioplasty.      2.  Placement of intracoronary stent.   ATTENDING:  Darlin Priestly, M.D.   COMPLICATIONS:  None.   INDICATIONS:  Mr. Costlow is a 77 year old male patient of Dr. Eric Form with  a history of hypertension, diabetes, history of peripheral vascular disease  status post right AKA and left fem-pop bypass, history of a recent posterior  MI with subsequent PTCA and stenting of his first OM.  He is known to have  an EF of approximately 20-25%.  He was known at that time to have a  significant mid RCA lesion which we had planned to fix in a staged fashion.  He was readmitted on May 17, 2005 with recurrent chest pain with  negative enzymes.  He is now brought for repeat catheterization to reassess  his CAD.   DESCRIPTION OF OPERATION:  After giving informed written consent, patient  brought to the cardiac catheterization laboratory.  Right and left groin  shaved, prepped, and draped in usual sterile fashion.  ECG monitor  established.  Using a modified Seldinger technique, a #6-French arterial  sheath inserted in right femoral artery.  A 6-French diagnostic catheter was  then used to perform diagnostic angiography.   The left main is a large vessel with no significant disease.   The LAD is a medium to large sized vessel which coursed to the apex, gave  rise to three diagonal branches.  The LAD is calcified with 50% stenosis  in  its proximal segment, 40% mid, and 80% apical lesion.   The first and third diagonals are small, diffusely diseased vessels.  The  second diagonal is a small, but longer vessel with a 90% ostial lesion.   Left circumflex is a small vessel coursing the AV groove, but does give rise  to one large obtuse marginal branch.  The AV groove circumflex has no  significant disease.   The first OM is a large vessel with a stent noted in its proximal portion  which appears to be widely patent.  There is sequential 50 and 40% lesions  scattered throughout the obtuse marginal but there does not appear to be any  significant disease at present.   The right coronary artery is a medium sized vessel which is dominant, gives  rise to both PDA/posterolateral branch.  The RCA is noted to be tortuous in  its proximal segment with a 95% mid RCA stenosis followed by 40% lesion.  There is 70% distal RCA disease prior to the  bifurcation of the PDA and  posterolateral branch.  Both the PDA and posterolateral branch are small  vessels with diffuse 70-80% lesions scattered throughout.   Left ventriculogram reveals severely depressed EF at 20-25% with global  hypokinesis.   HEMODYNAMICS:  Systemic arterial pressure 119/63, LV systemic pressure  117/5, LVEDP of 8.   INTERVENTIONAL PROCEDURE:  RCA-mid:  Following diagnostic angiography a #6-  French JR4 guiding catheter with side holes was engaged in the right  coronary ostium.  Next, a 0.014 ATW marker wire was advanced via the guiding  catheter and positioned in the distal PLA without difficulty.  Following  this a Voyager 2.5 x 15 mm balloon was then tracked into the mid RCA.  One  inflation at 10 atmospheres was performed for a total of 31 seconds.  Follow-  up angiogram revealed good luminal gain.  This balloon was then removed and  a CYPHER 2.5 x 18 mm stent was then positioned in the mid RCA which was then  deployed to 10 atmospheres for a total of 34  seconds.  A second inflation to  14 atmospheres was performed for a total of 37 seconds.  Follow-up angiogram  included good luminal gain with no evidence of dissection or thrombus but it  did appear to be somewhat under sized once deployed.  This balloon was then  removed and a PowerSail 2.75 x 13 mm balloon was then positioned in the mid  and then proximal portion of the stent with two subsequent inflations, one  to 14 atmospheres in the mid and one to 16 atmospheres in the proximal for  approximately 50 seconds.  Follow-up angiogram revealed no evidence of  dissection or thrombus with TIMI 3 flow to the distal vessel.  IV Angiomax  was used throughout the case.   Final orthogonal angiograms revealed less than 10% residual stenosis in the  mid RCA stenotic lesion with TIMI 3 flow to the distal vessel.  At this  point we elected to conclude the procedure.  All balloons ____ and catheters  were removed.  Hemostatic sheath was sewn in place and the patient was  transferred back to the ward in stable condition.   CONCLUSION:  1.  Successful percutaneous transluminal coronary balloon angioplasty with      placement of a CYPHER 2.5 x 18 mm stent in the mid right coronary artery      stenotic lesion ultimately post dilated to 2.9 mm.  2.  Widely patent obtuse marginal 1 stent.  3.  Diffuse small vessel disease involving the distal right coronary artery      as well as distal left anterior descending.  4.  Severely depressed left ventricular systolic function.  5.  Adjunct use of Angiomax infusion.      Darlin Priestly, MD  Electronically Signed     RHM/MEDQ  D:  05/18/2005  T:  05/18/2005  Job:  [id number]   cc:   [name], M.D.  Fax: [phone]

## 2011-01-25 ENCOUNTER — Other Ambulatory Visit: Payer: Self-pay | Admitting: Oncology

## 2011-01-25 ENCOUNTER — Encounter (HOSPITAL_BASED_OUTPATIENT_CLINIC_OR_DEPARTMENT_OTHER): Payer: Medicare Other | Admitting: Oncology

## 2011-01-25 ENCOUNTER — Encounter (HOSPITAL_COMMUNITY): Payer: Self-pay

## 2011-01-25 ENCOUNTER — Ambulatory Visit (HOSPITAL_COMMUNITY)
Admission: RE | Admit: 2011-01-25 | Discharge: 2011-01-25 | Disposition: A | Discharge status: Home / Self Care | Payer: Medicare Other | Source: Ambulatory Visit | Attending: Oncology | Admitting: Oncology

## 2011-01-25 DIAGNOSIS — M5146 Schmorl's nodes, lumbar region: Secondary | ICD-10-CM | POA: Insufficient documentation

## 2011-01-25 DIAGNOSIS — K7689 Other specified diseases of liver: Secondary | ICD-10-CM | POA: Insufficient documentation

## 2011-01-25 DIAGNOSIS — C6290 Malignant neoplasm of unspecified testis, unspecified whether descended or undescended: Secondary | ICD-10-CM

## 2011-01-25 DIAGNOSIS — M47817 Spondylosis without myelopathy or radiculopathy, lumbosacral region: Secondary | ICD-10-CM | POA: Insufficient documentation

## 2011-01-25 DIAGNOSIS — E278 Other specified disorders of adrenal gland: Secondary | ICD-10-CM | POA: Insufficient documentation

## 2011-01-25 HISTORY — DX: Essential (primary) hypertension: I10

## 2011-01-25 LAB — CMP (CANCER CENTER ONLY)
ALT(SGPT): 14 U/L (ref 10–47)
Albumin: 3.1 g/dL — ABNORMAL LOW (ref 3.3–5.5)
CO2: 27 mEq/L (ref 18–33)
Calcium: 8.4 mg/dL (ref 8.0–10.3)
Chloride: 103 mEq/L (ref 98–108)
Glucose, Bld: 224 mg/dL — ABNORMAL HIGH (ref 73–118)
Potassium: 4.6 mEq/L (ref 3.3–4.7)
Sodium: 142 mEq/L (ref 128–145)
Total Bilirubin: 0.3 mg/dl (ref 0.20–1.60)
Total Protein: 6.8 g/dL (ref 6.4–8.1)

## 2011-01-25 LAB — CBC WITH DIFFERENTIAL/PLATELET
BASO%: 0.5 % (ref 0.0–2.0)
Eosinophils Absolute: 0.3 10*3/uL (ref 0.0–0.5)
MCHC: 33.7 g/dL (ref 32.0–36.0)
MONO#: 0.5 10*3/uL (ref 0.1–0.9)
NEUT#: 3.2 10*3/uL (ref 1.5–6.5)
RBC: 4.06 10*6/uL — ABNORMAL LOW (ref 4.20–5.82)
RDW: 14.4 % (ref 11.0–14.6)
WBC: 5.5 10*3/uL (ref 4.0–10.3)
lymph#: 1.5 10*3/uL (ref 0.9–3.3)

## 2011-01-25 LAB — LACTATE DEHYDROGENASE: LDH: 147 U/L (ref 94–250)

## 2011-01-25 MED ORDER — IOHEXOL 300 MG/ML  SOLN: 125.0000 mL | Freq: Once | INTRAMUSCULAR | Status: AC | PRN

## 2011-01-25 MED ORDER — IOHEXOL 300 MG/ML  SOLN
125.0000 mL | Freq: Once | INTRAMUSCULAR | Status: AC | PRN
  Administered 2011-01-25: 125 mL via INTRAVENOUS

## 2011-01-28 ENCOUNTER — Encounter (HOSPITAL_BASED_OUTPATIENT_CLINIC_OR_DEPARTMENT_OTHER): Payer: Medicare Other | Admitting: Oncology

## 2011-01-28 ENCOUNTER — Other Ambulatory Visit: Payer: Self-pay | Admitting: Oncology

## 2011-01-28 DIAGNOSIS — Z8547 Personal history of malignant neoplasm of testis: Secondary | ICD-10-CM

## 2011-01-28 DIAGNOSIS — C6290 Malignant neoplasm of unspecified testis, unspecified whether descended or undescended: Secondary | ICD-10-CM

## 2011-05-03 LAB — CBC
HCT: 42.4
Platelets: 335
RDW: 13.5

## 2011-05-03 LAB — COMPREHENSIVE METABOLIC PANEL
AST: 20
Albumin: 2.8 — ABNORMAL LOW
Alkaline Phosphatase: 101
BUN: 16
GFR calc Af Amer: >60
Potassium: 3.8
Total Protein: 6.5

## 2011-05-03 LAB — DIFFERENTIAL
Basophils Absolute: 0.1
Eosinophils Absolute: 0.3
Eosinophils Relative: 3
Lymphocytes Relative: 24
Monocytes Absolute: 0.6

## 2011-05-03 LAB — URINALYSIS, ROUTINE W REFLEX MICROSCOPIC
Glucose, UA: NEGATIVE
Ketones, ur: NEGATIVE
Leukocytes, UA: NEGATIVE
pH: 6.5

## 2011-05-03 LAB — URINE MICROSCOPIC-ADD ON

## 2011-05-03 LAB — URINE CULTURE

## 2011-07-06 ENCOUNTER — Encounter: Payer: Self-pay | Admitting: *Deleted

## 2011-07-11 ENCOUNTER — Other Ambulatory Visit: Payer: Self-pay | Admitting: Oncology

## 2011-07-11 DIAGNOSIS — C6290 Malignant neoplasm of unspecified testis, unspecified whether descended or undescended: Secondary | ICD-10-CM

## 2011-07-12 ENCOUNTER — Other Ambulatory Visit: Payer: Self-pay | Admitting: Oncology

## 2011-07-12 ENCOUNTER — Other Ambulatory Visit (HOSPITAL_BASED_OUTPATIENT_CLINIC_OR_DEPARTMENT_OTHER): Payer: Medicare Other | Admitting: Lab

## 2011-07-12 ENCOUNTER — Ambulatory Visit (HOSPITAL_COMMUNITY)
Admission: RE | Admit: 2011-07-12 | Discharge: 2011-07-12 | Disposition: A | Discharge status: Home / Self Care | Payer: Medicare Other | Source: Ambulatory Visit | Attending: Oncology | Admitting: Oncology

## 2011-07-12 DIAGNOSIS — C6290 Malignant neoplasm of unspecified testis, unspecified whether descended or undescended: Secondary | ICD-10-CM

## 2011-07-12 DIAGNOSIS — E119 Type 2 diabetes mellitus without complications: Secondary | ICD-10-CM

## 2011-07-12 DIAGNOSIS — R599 Enlarged lymph nodes, unspecified: Secondary | ICD-10-CM | POA: Insufficient documentation

## 2011-07-12 DIAGNOSIS — I10 Essential (primary) hypertension: Secondary | ICD-10-CM

## 2011-07-12 DIAGNOSIS — Z8547 Personal history of malignant neoplasm of testis: Secondary | ICD-10-CM

## 2011-07-12 DIAGNOSIS — Z794 Long term (current) use of insulin: Secondary | ICD-10-CM

## 2011-07-12 LAB — CMP (CANCER CENTER ONLY)
CO2: 28 mEq/L (ref 18–33)
Creat: 1 mg/dl (ref 0.6–1.2)
Glucose, Bld: 121 mg/dL — ABNORMAL HIGH (ref 73–118)
Total Bilirubin: 0.5 mg/dl (ref 0.20–1.60)

## 2011-07-12 LAB — CBC WITH DIFFERENTIAL/PLATELET
Eosinophils Absolute: 0.4 10*3/uL (ref 0.0–0.5)
HCT: 40.5 % (ref 38.4–49.9)
LYMPH%: 25.5 % (ref 14.0–49.0)
MONO#: 0.5 10*3/uL (ref 0.1–0.9)
NEUT#: 5 10*3/uL (ref 1.5–6.5)
NEUT%: 62.4 % (ref 39.0–75.0)
Platelets: 190 10*3/uL (ref 140–400)
WBC: 7.9 10*3/uL (ref 4.0–10.3)

## 2011-07-12 MED ORDER — IOHEXOL 300 MG/ML  SOLN
100.0000 mL | Freq: Once | INTRAMUSCULAR | Status: AC | PRN
  Administered 2011-07-12: 100 mL via INTRAVENOUS

## 2011-07-19 ENCOUNTER — Telehealth: Payer: Self-pay | Admitting: Oncology

## 2011-07-19 ENCOUNTER — Ambulatory Visit (HOSPITAL_BASED_OUTPATIENT_CLINIC_OR_DEPARTMENT_OTHER): Payer: Medicare Other | Admitting: Oncology

## 2011-07-19 VITALS — BP 143/81 | HR 86 | Temp 97.1°F | Ht 72.0 in | Wt 258.7 lb

## 2011-07-19 DIAGNOSIS — C6290 Malignant neoplasm of unspecified testis, unspecified whether descended or undescended: Secondary | ICD-10-CM

## 2011-07-19 NOTE — Progress Notes (Signed)
Hematology and Oncology Follow Up Visit  Cori Henningsen [id number] 1933/06/13 77 y.o. 07/19/2011 10:37 AM    CC: Excell Seltzer. Annabell Howells, M.D.  [name], M.D.    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   Prior Therapy: Patient status post a right scrotal and inguinal exploration and radical orchiectomy, right inguinal lymphadenectomy, pathology revealing a dedifferentiated liposarcoma, low grade.   Current therapy: Observation and surveillance.  Interim History:  Mr. Cress presents today for a followup visit.  He is a pleasant gentleman with the above history, status post surgical resection of his dedifferentiated liposarcoma of the right testicle.  It was again surgically removed.  He did not receive any adjuvant therapy and here for observation and surveillance.  Clinically he had not reported any major complaints since last time I saw him.  Did not report any chest pain.  Did not report any shortness of breath.  Did not report any abdominal distension.  Did not report any abdominal masses.  Overall performance status and activity level remains about the same.  He has kind of limited performance status as well as limited activity level, but that has not really dramatically changed at this point.  Medications: I have reviewed the patient's current medications. Current outpatient prescriptions:clopidogrel (PLAVIX) 75 MG tablet, Take 75 mg by mouth daily.  , Disp: , Rfl: ;  furosemide (LASIX) 20 MG tablet, Take 20 mg by mouth daily. As needed , Disp: , Rfl: ;  insulin glargine (LANTUS) 100 UNIT/ML injection, Inject 10 Units into the skin daily before breakfast.  , Disp: , Rfl:  insulin regular (HUMULIN R,NOVOLIN R) 100 units/mL injection, Inject 4 Units into the skin 3 (three) times daily before meals.  , Disp: , Rfl: ;  lisinopril (PRINIVIL,ZESTRIL) 10 MG tablet, Take 10 mg by mouth daily.  , Disp: , Rfl: ;  simvastatin  (ZOCOR) 40 MG tablet, Take 40 mg by mouth at bedtime.  , Disp: , Rfl: ;  Tamsulosin HCl (FLOMAX) 0.4 MG CAPS, Take 0.4 mg by mouth daily.  , Disp: , Rfl:   Allergies: No Known Allergies  Past Medical History, Surgical history, Social history, and Family History were reviewed and updated.  Review of Systems: Constitutional:  Negative for fever, chills, night sweats, anorexia, weight loss, pain. Cardiovascular: no chest pain or dyspnea on exertion Respiratory: no cough, shortness of breath, or wheezing Neurological: no TIA or stroke symptoms Dermatological: negative ENT: negative Skin: Negative. Gastrointestinal: no abdominal pain, change in bowel habits, or black or bloody stools Genito-Urinary: no dysuria, trouble voiding, or hematuria Hematological and Lymphatic: negative Breast: negative Musculoskeletal: negative Remaining ROS negative. Physical Exam: Blood pressure 143/81, pulse 86, temperature 97.1 F (36.2 C), temperature source Oral, height 6' (1.829 m), weight 258 lb 11.2 oz (117.346 kg). ECOG: 3 General appearance: alert Head: Normocephalic, without obvious abnormality, atraumatic Neck: no adenopathy, no carotid bruit, no JVD, supple, symmetrical, trachea midline and thyroid not enlarged, symmetric, no tenderness/mass/nodules Lymph nodes: Cervical, supraclavicular, and axillary nodes normal. Heart:regular rate and rhythm, S1, S2 normal, no murmur, click, rub or gallop Lung:chest clear, no wheezing, rales, normal symmetric air entry Abdomin: soft, non-tender, without masses or organomegaly EXT:no erythema, induration, or nodules   Lab Results: Lab Results  Component Value Date   WBC 7.9 07/12/2011   HGB 13.9 07/12/2011   HCT 40.5 07/12/2011   MCV 94.9 07/12/2011   PLT 190 07/12/2011     Chemistry  Component Value Date/Time   NA 140 07/12/2011 1014   NA 138 12/02/2010 1320   NA 138 12/02/2010 1320   NA 138 12/02/2010 1320   K 4.2 07/12/2011 1014   K 4.3 12/02/2010  1320   K 4.3 12/02/2010 1320   K 4.3 12/02/2010 1320   CL 103 07/12/2011 1014   CL 103 12/02/2010 1320   CL 103 12/02/2010 1320   CL 103 12/02/2010 1320   CO2 28 07/12/2011 1014   CO2 23 12/02/2010 1320   CO2 23 12/02/2010 1320   CO2 23 12/02/2010 1320   BUN 17 07/12/2011 1014   BUN 14 12/02/2010 1320   BUN 14 12/02/2010 1320   BUN 14 12/02/2010 1320   CREATININE 1.0 07/12/2011 1014   CREATININE 1.19 12/02/2010 1320   CREATININE 1.19 12/02/2010 1320   CREATININE 1.19 12/02/2010 1320      Component Value Date/Time   CALCIUM 7.8* 07/12/2011 1014   CALCIUM 8.5 12/02/2010 1320   CALCIUM 8.5 12/02/2010 1320   CALCIUM 8.5 12/02/2010 1320   ALKPHOS 109* 07/12/2011 1014   ALKPHOS 101 12/02/2010 1320   ALKPHOS 101 12/02/2010 1320   ALKPHOS 101 12/02/2010 1320   AST 20 07/12/2011 1014   AST 12 12/02/2010 1320   AST 12 12/02/2010 1320   AST 12 12/02/2010 1320   ALT <8 12/02/2010 1320   ALT <8 12/02/2010 1320   ALT <8 12/02/2010 1320   BILITOT 0.50 07/12/2011 1014   BILITOT 0.4 12/02/2010 1320   BILITOT 0.4 12/02/2010 1320   BILITOT 0.4 12/02/2010 1320       Radiological Studies: CHEST - 2 VIEW  Comparison: Chest radiograph 10/20/2010 and 07/12/2005  Findings: Stable, mediastinal, hilar and cardiac contours. The  lung volumes are very low with bibasilar atelectasis. The  patient's chin partially obscures the right lung apex. No mass,  airspace disease, or pleural effusion is identified. There are  typical degenerative changes of the thoracic spine. No acute or  suspicious bony abnormality.  IMPRESSION:  Low lung volumes with bibasilar atelectasis. No acute findings  identified.   ABDOMEN AND PELVIS WITH CONTRAST  Technique: Multidetector CT imaging of the abdomen and pelvis was  performed following the standard protocol during bolus  administration of intravenous contrast.  Contrast: OMNIPAQUE IOHEXOL 300 MG/ML IV SOLN  Comparison: 01/25/2011  Findings: Postop changes from right orchiectomy are  again  demonstrated. TURP defect again noted in the prostate gland. Mild  left inguinal adenopathy measuring 1.9 x 2.2 cm remains stable.  Other shotty less than 1 cm bilateral inguinal and iliac lymph  nodes remain stable, and no other pathologically enlarged nodes are  seen within the pelvis. There is no evidence of abdominal  retroperitoneal lymphadenopathy.  The abdominal parenchymal organs are normal in appearance. Tiny  less than 1 cm left lower pole renal cyst remains stable. No  evidence of hydronephrosis. No evidence of inflammatory process or  abnormal fluid collections. No evidence of bowel wall thickening  or dilatation. No suspicious bone lesions are identified.  IMPRESSION:  1. Stable mild left inguinal lymphadenopathy.  2. No new or progressive disease within the abdomen or pelvis.    Impression and Plan: This is a pleasant 77 year old gentleman with the following issues:  1. 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  No evidence of any recurrent disease at this point, doing well clinically.  Laboratory values and imaging studies do not suggest recurrent  disease.  Again I think he is a rather marginal candidate for radiation therapy and I think we have elected to proceed with observation only.  Have him follow up in 6 months's time with repeat imaging studies at that time.  He is following up regularly as well with Dr. Annabell Howells.  2. History of hemorrhage in his groin back in April 2012.  That has subsided.      Nalia Honeycutt, MD 12/11/201210:37 AM

## 2011-07-19 NOTE — Telephone Encounter (Signed)
Gave pt oral contrast and calendar for June 2013

## 2012-01-12 ENCOUNTER — Other Ambulatory Visit (HOSPITAL_COMMUNITY): Payer: Medicare Other

## 2012-01-12 ENCOUNTER — Telehealth: Payer: Self-pay | Admitting: Oncology

## 2012-01-12 ENCOUNTER — Other Ambulatory Visit (HOSPITAL_BASED_OUTPATIENT_CLINIC_OR_DEPARTMENT_OTHER): Payer: Medicare Other | Admitting: Lab

## 2012-01-12 NOTE — Telephone Encounter (Signed)
Returned wife's call and r/s'd appts to 6/21 and 6/28. Wife aware of new d/t's.

## 2012-01-17 ENCOUNTER — Ambulatory Visit: Payer: Medicare Other | Admitting: Oncology

## 2012-01-19 ENCOUNTER — Other Ambulatory Visit: Payer: Self-pay | Admitting: *Deleted

## 2012-01-19 DIAGNOSIS — R1012 Left upper quadrant pain: Secondary | ICD-10-CM

## 2012-01-26 ENCOUNTER — Other Ambulatory Visit: Payer: Medicare Other | Admitting: Lab

## 2012-01-26 ENCOUNTER — Ambulatory Visit (HOSPITAL_COMMUNITY)
Admission: RE | Admit: 2012-01-26 | Discharge: 2012-01-26 | Disposition: A | Discharge status: Home / Self Care | Payer: Medicare Other | Source: Ambulatory Visit | Attending: Oncology | Admitting: Oncology

## 2012-01-26 DIAGNOSIS — Z9079 Acquired absence of other genital organ(s): Secondary | ICD-10-CM | POA: Insufficient documentation

## 2012-01-26 DIAGNOSIS — I517 Cardiomegaly: Secondary | ICD-10-CM | POA: Insufficient documentation

## 2012-01-26 DIAGNOSIS — C6290 Malignant neoplasm of unspecified testis, unspecified whether descended or undescended: Secondary | ICD-10-CM

## 2012-01-26 DIAGNOSIS — J479 Bronchiectasis, uncomplicated: Secondary | ICD-10-CM | POA: Insufficient documentation

## 2012-01-26 DIAGNOSIS — Z9089 Acquired absence of other organs: Secondary | ICD-10-CM | POA: Insufficient documentation

## 2012-01-26 DIAGNOSIS — I080 Rheumatic disorders of both mitral and aortic valves: Secondary | ICD-10-CM | POA: Insufficient documentation

## 2012-01-26 DIAGNOSIS — J984 Other disorders of lung: Secondary | ICD-10-CM | POA: Insufficient documentation

## 2012-01-26 DIAGNOSIS — I2510 Atherosclerotic heart disease of native coronary artery without angina pectoris: Secondary | ICD-10-CM | POA: Insufficient documentation

## 2012-01-26 DIAGNOSIS — I700 Atherosclerosis of aorta: Secondary | ICD-10-CM | POA: Insufficient documentation

## 2012-01-26 DIAGNOSIS — R0602 Shortness of breath: Secondary | ICD-10-CM | POA: Insufficient documentation

## 2012-01-26 DIAGNOSIS — R1012 Left upper quadrant pain: Secondary | ICD-10-CM

## 2012-01-26 DIAGNOSIS — N289 Disorder of kidney and ureter, unspecified: Secondary | ICD-10-CM | POA: Insufficient documentation

## 2012-01-26 DIAGNOSIS — K8689 Other specified diseases of pancreas: Secondary | ICD-10-CM | POA: Insufficient documentation

## 2012-01-26 DIAGNOSIS — K7200 Acute and subacute hepatic failure without coma: Secondary | ICD-10-CM | POA: Insufficient documentation

## 2012-01-26 LAB — CBC WITH DIFFERENTIAL/PLATELET
Eosinophils Absolute: 0.2 10*3/uL (ref 0.0–0.5)
HCT: 43.3 % (ref 38.4–49.9)
LYMPH%: 21.9 % (ref 14.0–49.0)
MONO#: 0.5 10*3/uL (ref 0.1–0.9)
NEUT#: 5.9 10*3/uL (ref 1.5–6.5)
Platelets: 184 10*3/uL (ref 140–400)
RBC: 4.46 10*6/uL (ref 4.20–5.82)
WBC: 8.6 10*3/uL (ref 4.0–10.3)

## 2012-01-26 LAB — CMP (CANCER CENTER ONLY)
Albumin: 3.6 g/dL (ref 3.3–5.5)
CO2: 29 mEq/L (ref 18–33)
Calcium: 9.1 mg/dL (ref 8.0–10.3)
Glucose, Bld: 142 mg/dL — ABNORMAL HIGH (ref 73–118)
Sodium: 142 mEq/L (ref 128–145)
Total Bilirubin: 0.6 mg/dl (ref 0.20–1.60)
Total Protein: 7.7 g/dL (ref 6.4–8.1)

## 2012-01-26 MED ORDER — IOHEXOL 300 MG/ML  SOLN
100.0000 mL | Freq: Once | INTRAMUSCULAR | Status: AC | PRN
  Administered 2012-01-26: 100 mL via INTRAVENOUS

## 2012-01-27 ENCOUNTER — Other Ambulatory Visit: Payer: Medicare Other | Admitting: Lab

## 2012-02-03 ENCOUNTER — Ambulatory Visit (HOSPITAL_BASED_OUTPATIENT_CLINIC_OR_DEPARTMENT_OTHER): Payer: Medicare Other | Admitting: Oncology

## 2012-02-03 VITALS — BP 100/55 | HR 77 | Temp 97.1°F

## 2012-02-03 DIAGNOSIS — C499 Malignant neoplasm of connective and soft tissue, unspecified: Secondary | ICD-10-CM

## 2012-02-03 DIAGNOSIS — C6290 Malignant neoplasm of unspecified testis, unspecified whether descended or undescended: Secondary | ICD-10-CM

## 2012-02-03 NOTE — Progress Notes (Signed)
Hematology and Oncology Follow Up Visit  [name] [id number] [birth year] 78 y.o. 02/03/2012 3:58 PM    CC: Excell Seltzer. Annabell Howells, M.D.  [name], M.D.    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   Prior Therapy: Patient status post a right scrotal and inguinal exploration and radical orchiectomy, right inguinal lymphadenectomy, pathology revealing a dedifferentiated liposarcoma, low grade.   Current therapy: Observation and surveillance.  Interim History:  [name] presents today for a followup visit.  He is a pleasant gentleman with the above history, status post surgical resection of his dedifferentiated liposarcoma of the right testicle.  It was again surgically removed.  He did not receive any adjuvant therapy and here for observation and surveillance.  Clinically he had not reported any major complaints since last time I saw him.  Did not report any chest pain.  Did not report any shortness of breath.  Did not report any abdominal distension.  Did not report any abdominal masses.  Overall performance status and activity level remains about the same.  He has  limited performance status as well as limited activity level, but that has not really dramatically changed at this point. He is reporting shoulder pain at times. No pain in the groin at this time.   Medications: I have reviewed the patient's current medications. Current outpatient prescriptions:atorvastatin (LIPITOR) 40 MG tablet, Take 40 mg by mouth daily., Disp: , Rfl: ;  clopidogrel (PLAVIX) 75 MG tablet, Take 75 mg by mouth daily.  , Disp: , Rfl: ;  furosemide (LASIX) 20 MG tablet, Take 20 mg by mouth daily. As needed , Disp: , Rfl: ;  insulin detemir (LEVEMIR) 100 UNIT/ML injection, Inject 8 Units into the skin at bedtime., Disp: , Rfl:  insulin glargine (LANTUS) 100 UNIT/ML injection, Inject 10 Units into the skin daily before breakfast.  , Disp: ,  Rfl: ;  insulin regular (HUMULIN R,NOVOLIN R) 100 units/mL injection, Inject 4 Units into the skin 3 (three) times daily before meals.  , Disp: , Rfl: ;  lisinopril (PRINIVIL,ZESTRIL) 10 MG tablet, Take 10 mg by mouth daily.  , Disp: , Rfl: ;  simvastatin (ZOCOR) 40 MG tablet, Take 40 mg by mouth at bedtime.  , Disp: , Rfl:  Tamsulosin HCl (FLOMAX) 0.4 MG CAPS, Take 0.4 mg by mouth daily.  , Disp: , Rfl:   Allergies: No Known Allergies  Past Medical History, Surgical history, Social history, and Family History were reviewed and updated.  Review of Systems: Constitutional:  Negative for fever, chills, night sweats, anorexia, weight loss, pain. Cardiovascular: no chest pain or dyspnea on exertion Respiratory: no cough, shortness of breath, or wheezing Neurological: no TIA or stroke symptoms Dermatological: negative ENT: negative Skin: Negative. Gastrointestinal: no abdominal pain, change in bowel habits, or black or bloody stools Genito-Urinary: no dysuria, trouble voiding, or hematuria Hematological and Lymphatic: negative Breast: negative Musculoskeletal: negative Remaining ROS negative. Physical Exam: Blood pressure 100/55, pulse 77, temperature 97.1 F (36.2 C), temperature source Oral. ECOG: 3 General appearance: alert Head: Normocephalic, without obvious abnormality, atraumatic Neck: no adenopathy, no carotid bruit, no JVD, supple, symmetrical, trachea midline and thyroid not enlarged, symmetric, no tenderness/mass/nodules Lymph nodes: Cervical, supraclavicular, and axillary nodes normal. Heart:regular rate and rhythm, S1, S2 normal, no murmur, click, rub or gallop Lung:chest clear, no wheezing, rales, normal symmetric air entry Abdomin: soft, non-tender, without masses or organomegaly EXT:no erythema, induration, or nodules   Lab  Results: Lab Results  Component Value Date   WBC 8.6 01/26/2012   HGB 14.5 01/26/2012   HCT 43.3 01/26/2012   MCV 97.1 01/26/2012   PLT 184  01/26/2012   CT ABDOMEN WITH CONTRAST  Technique: Multidetector CT imaging of the abdomen was performed  using the standard protocol following bolus administration of  intravenous contrast.  Contrast: OMNIPAQUE IOHEXOL 300 MG/ML SOLN  Comparison: CT of the abdomen with contrast 07/12/2011.  Findings:  Lung Bases: Mild bronchial wall thickening, cylindrical  bronchiectasis and chronic scarring is again noted in the lower  lobes of the lungs bilaterally, and to a lesser extent in the  lateral segment of the right middle lobe. No suspicious appearing  pulmonary nodules or masses are identified in the visualized lung  bases. No evidence of retrocrural adenopathy. Heart size is  mildly enlarged. There is atherosclerosis of the thoracic aorta and  the coronary arteries, including calcified atherosclerotic plaque  in the left main, left anterior descending, left circumflex and  right coronary arteries. A coronary artery stent is present, likely  within a ramus intermedius branch. Notably, there is myocardial  thinning and subendocardial fibrofatty metaplasia in the lateral  wall of the left ventricle, suggesting prior ramus intermedius or  circumflex territory myocardial infarction. Calcifications of the  aortic valve and the inferior aspect of the mitral annulus.  Abdomen: No pathologically enlarged mesenteric or retroperitoneal  lymph nodes are noted. Left lobe of the liver is atrophic. No  focal hepatic lesions are identified. Gallbladder is nearly  completely collapsed. Mild fatty atrophy of the pancreas, without  focal lesions. The appearance of the spleen, bilateral adrenal  glands and the right kidney is unremarkable. Subcentimeter low  attenuation lesion in the lower pole of the left kidney is too  small to definitively characterize, and is unchanged compared to  the prior examination, and is therefore favored to represent a  small cyst. Extensive atherosclerosis throughout the  abdominal  vasculature, without evidence of aneurysm or dissection.  Musculoskeletal: There are no aggressive appearing lytic or blastic  lesions noted in the visualized portions of the skeleton.  IMPRESSION:  1. No findings to suggest metastatic disease from testicular  cancer within the abdomen.  2. Atherosclerosis, including left main and three-vessel coronary  artery disease. Please note that although the presence of coronary  artery calcium documents the presence of coronary artery disease,  the severity of this disease and any potential stenosis cannot be  assessed on this non-gated CT examination. A coronary artery stent  is in place, likely within a ramus intermedius branch. There is  also evidence of fibrofatty metaplasia and myocardial thinning of  the lateral wall left ventricle, sequelae of old ramus intermedius  or left circumflex territory myocardial infarction.  3. There are calcifications of the aortic valve and inferior aspect  of mitral annulus. Echocardiographic correlation for evaluation of  potential valvular dysfunction may be warranted if clinically  indicated.  4. Extensive bibasilar scarring, suggesting sequela of recurrent  aspiration or prior infection.  5. Subcentimeter low attenuation lesion in the lower pole of the  left kidney is too small to definitively characterize but is  similar to prior examination 12/07/2007 and favored to represent a  small cyst.  Original Report Authenticated By: Florencia Reasons, M.D.   Impression and Plan: This is a pleasant 78 year old gentleman with the following issues:  1. 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  No evidence of any recurrent disease at this point, doing well clinically.  Laboratory values and imaging studies do not suggest recurrent disease.  Again I think he is a rather marginal candidate for radiation therapy and I  think we have elected to proceed with observation only.  Have him follow up in 6 months's time with repeat imaging studies in 12 months.  .  2. History of hemorrhage in his groin back in April 2012.  That has subsided.      [facility], MD 6/28/20133:58 PM

## 2012-07-13 ENCOUNTER — Encounter: Payer: Self-pay | Admitting: Gastroenterology

## 2012-08-10 ENCOUNTER — Ambulatory Visit (INDEPENDENT_AMBULATORY_CARE_PROVIDER_SITE_OTHER): Payer: Medicare Other | Admitting: Gastroenterology

## 2012-08-10 ENCOUNTER — Encounter: Payer: Self-pay | Admitting: Gastroenterology

## 2012-08-10 ENCOUNTER — Other Ambulatory Visit: Payer: Medicare Other

## 2012-08-10 VITALS — BP 100/70 | HR 64

## 2012-08-10 DIAGNOSIS — Z1211 Encounter for screening for malignant neoplasm of colon: Secondary | ICD-10-CM

## 2012-08-10 DIAGNOSIS — Z7901 Long term (current) use of anticoagulants: Secondary | ICD-10-CM

## 2012-08-10 DIAGNOSIS — C499 Malignant neoplasm of connective and soft tissue, unspecified: Secondary | ICD-10-CM

## 2012-08-10 DIAGNOSIS — E1059 Type 1 diabetes mellitus with other circulatory complications: Secondary | ICD-10-CM

## 2012-08-10 DIAGNOSIS — E1051 Type 1 diabetes mellitus with diabetic peripheral angiopathy without gangrene: Secondary | ICD-10-CM

## 2012-08-10 DIAGNOSIS — I798 Other disorders of arteries, arterioles and capillaries in diseases classified elsewhere: Secondary | ICD-10-CM

## 2012-08-10 NOTE — Progress Notes (Signed)
History of Present Illness:  This is a 79 year old African American male who is an insulin-dependent diabetes and has had previous bilateral lower extremity amputations.  He also has other peripheral vascular problems with previous CVA is on Plavix 75 mg a day.  He has a history of recurrent cardiac arrhythmias and apparently has refused defibrillator implant .  There is a vague history of possible colon cancer in his brother.  He apparently had a negative colonoscopy 10 years ago, but I cannot find that report.  I did see him in 2009  for melena, and at that time endoscopy was unremarkable, and he was treated with PPI therapy.  Currently denies any upper or lower GI complaints except for beef intolerance.  Review of multiple labs from primary care shows no evidence of anemia.   I have reviewed this patient's present history, medical and surgical past history, allergies and medications.     ROS: The remainder of the 10 point ROS is negative  No Known Allergies Outpatient Prescriptions Prior to Visit  Medication Sig Dispense Refill  . atorvastatin (LIPITOR) 40 MG tablet Take 40 mg by mouth daily.      . clopidogrel (PLAVIX) 75 MG tablet Take 75 mg by mouth daily.        . furosemide (LASIX) 20 MG tablet Take 20 mg by mouth daily. As needed       . insulin detemir (LEVEMIR) 100 UNIT/ML injection Inject 8 Units into the skin at bedtime.      . insulin glargine (LANTUS) 100 UNIT/ML injection Inject 10 Units into the skin daily before breakfast.        . insulin regular (HUMULIN R,NOVOLIN R) 100 units/mL injection Inject 4 Units into the skin 3 (three) times daily before meals.        Marland Kitchen lisinopril (PRINIVIL,ZESTRIL) 10 MG tablet Take 10 mg by mouth daily.        . simvastatin (ZOCOR) 40 MG tablet Take 40 mg by mouth at bedtime.        . Tamsulosin HCl (FLOMAX) 0.4 MG CAPS Take 0.4 mg by mouth daily.         Last reviewed on 08/10/2012  1:48 PM by Mardella Layman, MD Past Medical History  Diagnosis  Date  . Diabetes mellitus   . Hypertension   . Testicular cancer dx'd 08/2010    surg only  . CHF (congestive heart failure)   . CAD (coronary artery disease)   . Peripheral vascular disease   . Vitamin B12 deficiency   . Diabetic retinopathy   . Diabetic peripheral neuropathy   . Cholelithiasis   . Family history of colon cancer     brother    Past Surgical History  Procedure Date  . Below knee leg amputation 2005    Right  . Below knee leg amputation 2006    Left  . Appendectomy   . Eye surgery 2012    Laser  . Ptca    History   Social History  . Marital Status: Married    Spouse Name: N/A    Number of Children: 2  . Years of Education: N/A   Occupational History  . Disabled    Social History Main Topics  . Smoking status: Former Games developer  . Smokeless tobacco: Never Used  . Alcohol Use: No  . Drug Use: No  . Sexually Active: None   Other Topics Concern  . None   Social History Narrative  . None  Family History  Problem Relation Age of Onset  . Colon cancer Brother         Physical Exam: Blood pressure 100/70, pulse 64, no weight obtained since the patient is wheelchair-bound.  General well developed well nourished patient in no acute distress, appearing their stated age Eyes PERRLA, no icterus, fundoscopic exam per opthamologist Skin no lesions noted Neck supple, no adenopathy, no thyroid enlargement, no tenderness Chest clear to percussion and auscultation Heart no significant murmurs, gallops or rubs noted Abdomen no hepatosplenomegaly masses or tenderness, BS normal.  Neurologic patient oriented x 3, cranial nerves intact, no focal neurologic deficits noted. Psychological mental status normal and normal affect.  Assessment and plan: This patient is an extremely poor candidate for conscious sedation and colonoscopy.  I think the risk outweigh any possible benefits of this time.  I will have him do IFOB ORIF for occult blood.  If these are  positive, I will do CT colonoscopy given his anticoagulation state and general medical condition with insulin-dependent diabetes and multiple cardiovascular issues. He seemed to have no major GI complaints at this time.   Encounter Diagnosis  Name Primary?  . Colon cancer screening Yes

## 2012-08-10 NOTE — Patient Instructions (Signed)
Your physician has requested that you go to the basement for the following lab work before leaving today: IFOB

## 2012-08-17 ENCOUNTER — Other Ambulatory Visit (HOSPITAL_BASED_OUTPATIENT_CLINIC_OR_DEPARTMENT_OTHER): Payer: Medicaid Other | Admitting: Lab

## 2012-08-17 ENCOUNTER — Ambulatory Visit (HOSPITAL_BASED_OUTPATIENT_CLINIC_OR_DEPARTMENT_OTHER): Payer: Medicare Other | Admitting: Oncology

## 2012-08-17 ENCOUNTER — Telehealth: Payer: Self-pay | Admitting: Oncology

## 2012-08-17 ENCOUNTER — Other Ambulatory Visit: Payer: Self-pay | Admitting: Oncology

## 2012-08-17 ENCOUNTER — Encounter: Payer: Self-pay | Admitting: Oncology

## 2012-08-17 VITALS — BP 101/62 | HR 98 | Temp 98.3°F | Resp 20

## 2012-08-17 DIAGNOSIS — Z8547 Personal history of malignant neoplasm of testis: Secondary | ICD-10-CM

## 2012-08-17 DIAGNOSIS — K8020 Calculus of gallbladder without cholecystitis without obstruction: Secondary | ICD-10-CM

## 2012-08-17 DIAGNOSIS — C6290 Malignant neoplasm of unspecified testis, unspecified whether descended or undescended: Secondary | ICD-10-CM

## 2012-08-17 DIAGNOSIS — C499 Malignant neoplasm of connective and soft tissue, unspecified: Secondary | ICD-10-CM

## 2012-08-17 HISTORY — DX: Malignant neoplasm of connective and soft tissue, unspecified: C49.9

## 2012-08-17 LAB — CBC WITH DIFFERENTIAL/PLATELET
Eosinophils Absolute: 0.4 10*3/uL (ref 0.0–0.5)
MONO#: 0.7 10*3/uL (ref 0.1–0.9)
NEUT#: 4.9 10*3/uL (ref 1.5–6.5)
Platelets: 203 10*3/uL (ref 140–400)
RBC: 4.02 10*6/uL — ABNORMAL LOW (ref 4.20–5.82)
RDW: 14.6 % (ref 11.0–14.6)
WBC: 7.8 10*3/uL (ref 4.0–10.3)
lymph#: 1.7 10*3/uL (ref 0.9–3.3)

## 2012-08-17 LAB — COMPREHENSIVE METABOLIC PANEL (CC13)
Albumin: 2.3 g/dL — ABNORMAL LOW (ref 3.5–5.0)
Alkaline Phosphatase: 97 U/L (ref 40–150)
BUN: 12 mg/dL (ref 7.0–26.0)
Calcium: 8.7 mg/dL (ref 8.4–10.4)
Chloride: 107 mEq/L (ref 98–107)
Glucose: 139 mg/dl — ABNORMAL HIGH (ref 70–99)
Potassium: 4 mEq/L (ref 3.5–5.1)

## 2012-08-17 NOTE — Progress Notes (Signed)
Hematology and Oncology Follow Up Visit  [name] [id number] 1933-03-20 79 y.o. 08/17/2012 3:58 PM    CC: [name]. [name], M.D.  [name], M.D.    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   Prior Therapy: Patient status post a right scrotal and inguinal exploration and radical orchiectomy, right inguinal lymphadenectomy, pathology revealing a dedifferentiated liposarcoma, low grade.   Current therapy: Observation and surveillance.  Interim History:  [name] presents today for a followup visit.  He is a pleasant gentleman with the above history, status post surgical resection of his dedifferentiated liposarcoma of the right testicle.  It was again surgically removed.  He did not receive any adjuvant therapy and here for observation and surveillance.  Clinically he had not reported any major complaints since last time I saw him.  Did not report any chest pain.  Did not report any shortness of breath.  Did not report any abdominal distension.  Did not report any abdominal masses.  Overall performance status and activity level remains about the same.  He has  limited performance status as well as limited activity level, but that has not really dramatically changed at this point. He is reporting shoulder pain at times. No pain in the groin at this time.   Medications: I have reviewed the patient's current medications. Current outpatient prescriptions:atorvastatin (LIPITOR) 40 MG tablet, Take 40 mg by mouth daily., Disp: , Rfl: ;  clopidogrel (PLAVIX) 75 MG tablet, Take 75 mg by mouth daily.  , Disp: , Rfl: ;  furosemide (LASIX) 20 MG tablet, Take 20 mg by mouth daily. As needed , Disp: , Rfl: ;  HYDROcodone-acetaminophen (LORTAB) 7.5-500 MG per tablet, Take 1 tablet by mouth every 6 (six) hours as needed., Disp: , Rfl:  insulin detemir (LEVEMIR) 100 UNIT/ML injection, Inject 8 Units into the skin at bedtime.,  Disp: , Rfl: ;  insulin glargine (LANTUS) 100 UNIT/ML injection, Inject 10 Units into the skin daily before breakfast.  , Disp: , Rfl: ;  insulin regular (HUMULIN R,NOVOLIN R) 100 units/mL injection, Inject 4 Units into the skin 3 (three) times daily before meals.  , Disp: , Rfl:  lisinopril (PRINIVIL,ZESTRIL) 10 MG tablet, Take 10 mg by mouth daily.  , Disp: , Rfl: ;  simvastatin (ZOCOR) 40 MG tablet, Take 40 mg by mouth at bedtime.  , Disp: , Rfl: ;  Tamsulosin HCl (FLOMAX) 0.4 MG CAPS, Take 0.4 mg by mouth daily.  , Disp: , Rfl: ;  traMADol-acetaminophen (ULTRACET) 37.5-325 MG per tablet, Take 1 tablet by mouth every 6 (six) hours as needed., Disp: , Rfl:   Allergies: No Known Allergies  Past Medical History, Surgical history, Social history, and Family History were reviewed and updated.  Review of Systems: Constitutional:  Negative for fever, chills, night sweats, anorexia, weight loss, pain. Cardiovascular: no chest pain or dyspnea on exertion Respiratory: no cough, shortness of breath, or wheezing Neurological: no TIA or stroke symptoms Dermatological: negative ENT: negative Skin: Negative. Gastrointestinal: no abdominal pain, change in bowel habits, or black or bloody stools Genito-Urinary: no dysuria, trouble voiding, or hematuria Hematological and Lymphatic: negative Breast: negative Musculoskeletal: negative Remaining ROS negative.  Physical Exam: Blood pressure 101/62, pulse 98, temperature 98.3 F (36.8 C), temperature source Oral, resp. rate 20. ECOG: 3 General appearance: alert Head: Normocephalic, without obvious abnormality, atraumatic Neck: no adenopathy, no carotid bruit, no JVD, supple, symmetrical, trachea midline and thyroid not enlarged,  symmetric, no tenderness/mass/nodules Lymph nodes: Cervical, supraclavicular, and axillary nodes normal. Heart:regular rate and rhythm, S1, S2 normal, no murmur, click, rub or gallop Lung:chest clear, no wheezing, rales, normal  symmetric air entry Abdomen: soft, non-tender, without masses or organomegaly EXT:no erythema, induration, or nodules. Bilateral BKA amputee   Lab Results: Lab Results  Component Value Date   WBC 7.8 08/17/2012   HGB 13.3 08/17/2012   HCT 39.1 08/17/2012   MCV 97.3 08/17/2012   PLT 203 08/17/2012    Impression and Plan: This is a pleasant 79 year old gentleman with the following issues:  1. 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  No evidence of any recurrent disease at this point, doing well clinically.  Laboratory values and physical exam do not suggest recurrent disease.  Again I think he is a rather marginal candidate for radiation therapy and we have elected to proceed with observation only.   2. History of hemorrhage in his groin back in April 2012.  That has subsided. 3. DM. On multiple insulins per PCP. 4. Follow-up. In 6 months with surveillance CT scan.      [name], [name] 1/10/20143:58 PM

## 2012-08-17 NOTE — Telephone Encounter (Signed)
Gave pt appt for July 2014 lab and MD, also gave patient oral contrast for CT before MD visit

## 2012-08-23 ENCOUNTER — Ambulatory Visit: Payer: Medicare Other | Attending: Internal Medicine | Admitting: Physical Therapy

## 2012-08-23 DIAGNOSIS — R262 Difficulty in walking, not elsewhere classified: Secondary | ICD-10-CM | POA: Insufficient documentation

## 2012-08-23 DIAGNOSIS — IMO0001 Reserved for inherently not codable concepts without codable children: Secondary | ICD-10-CM | POA: Insufficient documentation

## 2012-08-23 DIAGNOSIS — M6281 Muscle weakness (generalized): Secondary | ICD-10-CM | POA: Insufficient documentation

## 2013-02-12 ENCOUNTER — Encounter (HOSPITAL_COMMUNITY): Payer: Self-pay

## 2013-02-12 ENCOUNTER — Other Ambulatory Visit (HOSPITAL_BASED_OUTPATIENT_CLINIC_OR_DEPARTMENT_OTHER): Payer: Medicare Other | Admitting: Lab

## 2013-02-12 ENCOUNTER — Ambulatory Visit (HOSPITAL_COMMUNITY)
Admission: RE | Admit: 2013-02-12 | Discharge: 2013-02-12 | Disposition: A | Discharge status: Home / Self Care | Payer: Medicare Other | Source: Ambulatory Visit | Attending: Oncology | Admitting: Oncology

## 2013-02-12 DIAGNOSIS — C6290 Malignant neoplasm of unspecified testis, unspecified whether descended or undescended: Secondary | ICD-10-CM

## 2013-02-12 DIAGNOSIS — C499 Malignant neoplasm of connective and soft tissue, unspecified: Secondary | ICD-10-CM

## 2013-02-12 DIAGNOSIS — K8020 Calculus of gallbladder without cholecystitis without obstruction: Secondary | ICD-10-CM

## 2013-02-12 DIAGNOSIS — R918 Other nonspecific abnormal finding of lung field: Secondary | ICD-10-CM | POA: Insufficient documentation

## 2013-02-12 DIAGNOSIS — R599 Enlarged lymph nodes, unspecified: Secondary | ICD-10-CM | POA: Insufficient documentation

## 2013-02-12 LAB — CBC WITH DIFFERENTIAL/PLATELET
Basophils Absolute: 0.1 10*3/uL (ref 0.0–0.1)
Eosinophils Absolute: 0.4 10*3/uL (ref 0.0–0.5)
HGB: 13.2 g/dL (ref 13.0–17.1)
MONO#: 0.5 10*3/uL (ref 0.1–0.9)
NEUT#: 5.5 10*3/uL (ref 1.5–6.5)
RDW: 13.9 % (ref 11.0–14.6)
lymph#: 2.3 10*3/uL (ref 0.9–3.3)

## 2013-02-12 LAB — COMPREHENSIVE METABOLIC PANEL (CC13)
Albumin: 2.7 g/dL — ABNORMAL LOW (ref 3.5–5.0)
CO2: 26 mEq/L (ref 22–29)
Glucose: 212 mg/dl — ABNORMAL HIGH (ref 70–140)
Potassium: 3.8 mEq/L (ref 3.5–5.1)
Sodium: 138 mEq/L (ref 136–145)
Total Protein: 6.2 g/dL — ABNORMAL LOW (ref 6.4–8.3)

## 2013-02-12 MED ORDER — IOHEXOL 300 MG/ML  SOLN
100.0000 mL | Freq: Once | INTRAMUSCULAR | Status: AC | PRN
  Administered 2013-02-12: 100 mL via INTRAVENOUS

## 2013-02-15 ENCOUNTER — Telehealth: Payer: Self-pay | Admitting: Oncology

## 2013-02-15 ENCOUNTER — Ambulatory Visit (HOSPITAL_BASED_OUTPATIENT_CLINIC_OR_DEPARTMENT_OTHER): Payer: Medicare Other | Admitting: Oncology

## 2013-02-15 VITALS — BP 141/70 | HR 74 | Temp 96.7°F | Resp 18

## 2013-02-15 DIAGNOSIS — C499 Malignant neoplasm of connective and soft tissue, unspecified: Secondary | ICD-10-CM

## 2013-02-15 DIAGNOSIS — C6290 Malignant neoplasm of unspecified testis, unspecified whether descended or undescended: Secondary | ICD-10-CM

## 2013-02-15 NOTE — Progress Notes (Signed)
Hematology and Oncology Follow Up Visit  [name] [id number] [birth year] 79 y.o. 02/15/2013 10:05 AM    CC: [name]. [name], M.D.  [name], M.D.    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   Prior Therapy: Patient status post a right scrotal and inguinal exploration and radical orchiectomy, right inguinal lymphadenectomy, pathology revealing a dedifferentiated liposarcoma, low grade.   Current therapy: Observation and surveillance.  Interim History:  [name] presents today for a followup visit.  He is a pleasant gentleman with the above history, status post surgical resection of his dedifferentiated liposarcoma of the right testicle.  It was again surgically removed.  He did not receive any adjuvant therapy and here for observation and surveillance.  Clinically he had not reported any major complaints since last time I saw him.  Did not report any chest pain.  Did not report any shortness of breath.  Did not report any abdominal distension.  Did not report any abdominal masses.  Overall performance status and activity level remains about the same.  He has  limited performance status as well as limited activity level, but that has not really dramatically changed at this point. He is reporting shoulder pain at times. No pain in the groin at this time. His mobility have decreased over the last 12 months. He still uses a motorized scooter for mobility.   Medications: I have reviewed the patient's current medications. Current Outpatient Prescriptions  Medication Sig Dispense Refill  . atorvastatin (LIPITOR) 40 MG tablet Take 40 mg by mouth daily.      . carvedilol (COREG) 6.25 MG tablet Take 6.25 mg by mouth daily.      . clopidogrel (PLAVIX) 75 MG tablet Take 75 mg by mouth daily.        . furosemide (LASIX) 20 MG tablet Take 20 mg by mouth daily. As needed       . HYDROcodone-acetaminophen (LORTAB)  7.5-500 MG per tablet Take 1 tablet by mouth every 6 (six) hours as needed.      . insulin detemir (LEVEMIR) 100 UNIT/ML injection Inject 8 Units into the skin at bedtime.      . insulin glargine (LANTUS) 100 UNIT/ML injection Inject 10 Units into the skin daily before breakfast.        . insulin regular (HUMULIN R,NOVOLIN R) 100 units/mL injection Inject 4 Units into the skin 3 (three) times daily before meals.        Marland Kitchen lisinopril (PRINIVIL,ZESTRIL) 10 MG tablet Take 10 mg by mouth daily.        . simvastatin (ZOCOR) 40 MG tablet Take 40 mg by mouth at bedtime.        . Tamsulosin HCl (FLOMAX) 0.4 MG CAPS Take 0.4 mg by mouth daily.        . traMADol (ULTRAM) 50 MG tablet Take 50 mg by mouth every 6 (six) hours.      . traMADol-acetaminophen (ULTRACET) 37.5-325 MG per tablet Take 1 tablet by mouth every 6 (six) hours as needed.      . VESICARE 5 MG tablet Take 5 mg by mouth daily.      Marland Kitchen ZETIA 10 MG tablet Take 10 mg by mouth daily.       No current facility-administered medications for this visit.    Allergies: No Known Allergies  Past Medical History, Surgical history, Social history, and Family History were reviewed and updated.  Review of Systems:  Constitutional:  Negative for fever, chills, night sweats, anorexia, weight loss, pain. Cardiovascular: no chest pain or dyspnea on exertion Respiratory: no cough, shortness of breath, or wheezing Neurological: no TIA or stroke symptoms Dermatological: negative ENT: negative Skin: Negative. Gastrointestinal: no abdominal pain, change in bowel habits, or black or bloody stools Genito-Urinary: no dysuria, trouble voiding, or hematuria Hematological and Lymphatic: negative Breast: negative Musculoskeletal: negative Remaining ROS negative.  Physical Exam: Blood pressure 141/70, pulse 74, temperature 96.7 F (35.9 C), temperature source Oral, resp. rate 18. ECOG: 3 General appearance: alert Head: Normocephalic, without obvious  abnormality, atraumatic Neck: no adenopathy, no carotid bruit, no JVD, supple, symmetrical, trachea midline and thyroid not enlarged, symmetric, no tenderness/mass/nodules Lymph nodes: Cervical, supraclavicular, and axillary nodes normal. Heart:regular rate and rhythm, S1, S2 normal, no murmur, click, rub or gallop Lung:chest clear, no wheezing, rales, normal symmetric air entry Abdomen: soft, non-tender, without masses or organomegaly EXT:no erythema, induration, or nodules. Bilateral BKA amputee   Lab Results: Lab Results  Component Value Date   WBC 8.8 02/12/2013   HGB 13.2 02/12/2013   HCT 39.2 02/12/2013   MCV 94.0 02/12/2013   PLT 224 02/12/2013   CT ABDOMEN AND PELVIS WITH CONTRAST  Technique: Multidetector CT imaging of the abdomen and pelvis was  performed following the standard protocol during bolus  administration of intravenous contrast.  Contrast: OMNIPAQUE IOHEXOL 300 MG/ML SOLN  Comparison: Abdomen CT only on 01/26/2012 and abdomen pelvis CT on  07/12/2011  Findings: Postop changes from prior right orchiectomy again  demonstrated. No evidence of right inguinal lymphadenopathy. Mild  left inguinal lymphadenopathy is again seen which measures 17 mm in  short axis on image 91 is not simply change since prior study.  Small less than 1 cm bilateral external iliac lymph nodes remain  stable, and no other pathologically enlarged nodes are seen within  the pelvis. No evidence of retroperitoneal lymphadenopathy.  The liver, gallbladder, pancreas, spleen, and adrenal glands are  normal in appearance. No other soft tissue masses are identified.  No evidence of inflammatory process or abnormal fluid collections.  TURP defect again noted. No evidence of bowel wall thickening,  dilatation, or hernia. No suspicious bone lesions identified.  Images through the lung bases show poorly-defined rounded areas of  airspace disease in the left lower lobe and posterior lingula,  which are  new since prior exam. This appearance is nonspecific, but  favors pneumonia over metastatic disease.  IMPRESSION:  1. Stable mild left inguinal lymphadenopathy.  2. No new or progressive disease within the abdomen or pelvis.  3. New ill-defined areas of airspace opacity in the left lower  lobe and posterior lingula. Differential diagnosis includes  pneumonia with metastatic disease considered less likely. Recommend  clinical correlation and further evaluation by chest radiograph.    Impression and Plan: This is a pleasant 79 year old gentleman with the following issues:  1. 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  No evidence of any recurrent disease at this point, doing well clinically.  Laboratory values, CT scan from 02/12/2013 that was discussed today and physical exam do not suggest recurrent disease.  Again I think he is a rather marginal candidate for radiation therapy and we have elected to proceed with observation only.   2. History of hemorrhage in his groin back in April 2012.  That has subsided. 3. DM. On multiple insulins per PCP. 4. Follow-up. In 12 months with surveillance CT  scan.      [name] 7/11/201410:05 AM

## 2013-02-15 NOTE — Telephone Encounter (Signed)
gv and prnted appt sched and avs forl pt....gv pt barium

## 2013-03-04 ENCOUNTER — Inpatient Hospital Stay (HOSPITAL_COMMUNITY): Payer: Medicare Other | Admitting: Certified Registered"

## 2013-03-04 ENCOUNTER — Emergency Department (HOSPITAL_COMMUNITY): Payer: Medicare Other

## 2013-03-04 ENCOUNTER — Inpatient Hospital Stay: Admit: 2013-03-04 | Payer: Self-pay | Admitting: Neurosurgery

## 2013-03-04 ENCOUNTER — Encounter (HOSPITAL_COMMUNITY): Payer: Self-pay | Admitting: Internal Medicine

## 2013-03-04 ENCOUNTER — Encounter (HOSPITAL_COMMUNITY): Payer: Self-pay | Admitting: Certified Registered"

## 2013-03-04 ENCOUNTER — Encounter (HOSPITAL_COMMUNITY)
Admission: EM | Disposition: A | Discharge status: Skilled Nursing Facility | Payer: Self-pay | Source: Home / Self Care | Attending: Neurosurgery

## 2013-03-04 ENCOUNTER — Inpatient Hospital Stay (HOSPITAL_COMMUNITY)
Admission: EM | Admit: 2013-03-04 | Discharge: 2013-03-20 | DRG: 025 | Disposition: A | Discharge status: Skilled Nursing Facility | Payer: Medicare Other | Attending: Neurosurgery | Admitting: Neurosurgery

## 2013-03-04 DIAGNOSIS — N058 Unspecified nephritic syndrome with other morphologic changes: Secondary | ICD-10-CM | POA: Diagnosis present

## 2013-03-04 DIAGNOSIS — R569 Unspecified convulsions: Secondary | ICD-10-CM | POA: Diagnosis not present

## 2013-03-04 DIAGNOSIS — L8992 Pressure ulcer of unspecified site, stage 2: Secondary | ICD-10-CM | POA: Diagnosis present

## 2013-03-04 DIAGNOSIS — I10 Essential (primary) hypertension: Secondary | ICD-10-CM | POA: Diagnosis present

## 2013-03-04 DIAGNOSIS — Z7902 Long term (current) use of antithrombotics/antiplatelets: Secondary | ICD-10-CM

## 2013-03-04 DIAGNOSIS — S065X9A Traumatic subdural hemorrhage with loss of consciousness of unspecified duration, initial encounter: Principal | ICD-10-CM | POA: Diagnosis present

## 2013-03-04 DIAGNOSIS — E1139 Type 2 diabetes mellitus with other diabetic ophthalmic complication: Secondary | ICD-10-CM | POA: Diagnosis present

## 2013-03-04 DIAGNOSIS — G8190 Hemiplegia, unspecified affecting unspecified side: Secondary | ICD-10-CM | POA: Diagnosis present

## 2013-03-04 DIAGNOSIS — R4701 Aphasia: Secondary | ICD-10-CM | POA: Diagnosis not present

## 2013-03-04 DIAGNOSIS — Z8547 Personal history of malignant neoplasm of testis: Secondary | ICD-10-CM

## 2013-03-04 DIAGNOSIS — Z794 Long term (current) use of insulin: Secondary | ICD-10-CM

## 2013-03-04 DIAGNOSIS — S88119A Complete traumatic amputation at level between knee and ankle, unspecified lower leg, initial encounter: Secondary | ICD-10-CM

## 2013-03-04 DIAGNOSIS — E538 Deficiency of other specified B group vitamins: Secondary | ICD-10-CM | POA: Diagnosis present

## 2013-03-04 DIAGNOSIS — IMO0002 Reserved for concepts with insufficient information to code with codable children: Secondary | ICD-10-CM | POA: Diagnosis not present

## 2013-03-04 DIAGNOSIS — L02519 Cutaneous abscess of unspecified hand: Secondary | ICD-10-CM | POA: Diagnosis present

## 2013-03-04 DIAGNOSIS — Z7982 Long term (current) use of aspirin: Secondary | ICD-10-CM

## 2013-03-04 DIAGNOSIS — E1159 Type 2 diabetes mellitus with other circulatory complications: Secondary | ICD-10-CM | POA: Diagnosis present

## 2013-03-04 DIAGNOSIS — I6203 Nontraumatic chronic subdural hemorrhage: Secondary | ICD-10-CM | POA: Diagnosis present

## 2013-03-04 DIAGNOSIS — E119 Type 2 diabetes mellitus without complications: Secondary | ICD-10-CM

## 2013-03-04 DIAGNOSIS — L03019 Cellulitis of unspecified finger: Secondary | ICD-10-CM | POA: Diagnosis present

## 2013-03-04 DIAGNOSIS — I5022 Chronic systolic (congestive) heart failure: Secondary | ICD-10-CM | POA: Diagnosis present

## 2013-03-04 DIAGNOSIS — I2510 Atherosclerotic heart disease of native coronary artery without angina pectoris: Secondary | ICD-10-CM | POA: Diagnosis present

## 2013-03-04 DIAGNOSIS — S065XAA Traumatic subdural hemorrhage with loss of consciousness status unknown, initial encounter: Principal | ICD-10-CM | POA: Diagnosis present

## 2013-03-04 DIAGNOSIS — L89109 Pressure ulcer of unspecified part of back, unspecified stage: Secondary | ICD-10-CM | POA: Diagnosis present

## 2013-03-04 DIAGNOSIS — E11319 Type 2 diabetes mellitus with unspecified diabetic retinopathy without macular edema: Secondary | ICD-10-CM | POA: Diagnosis present

## 2013-03-04 DIAGNOSIS — J189 Pneumonia, unspecified organism: Secondary | ICD-10-CM | POA: Diagnosis present

## 2013-03-04 DIAGNOSIS — Z79899 Other long term (current) drug therapy: Secondary | ICD-10-CM

## 2013-03-04 DIAGNOSIS — I798 Other disorders of arteries, arterioles and capillaries in diseases classified elsewhere: Secondary | ICD-10-CM | POA: Diagnosis present

## 2013-03-04 DIAGNOSIS — M869 Osteomyelitis, unspecified: Secondary | ICD-10-CM | POA: Diagnosis present

## 2013-03-04 DIAGNOSIS — R627 Adult failure to thrive: Secondary | ICD-10-CM | POA: Diagnosis present

## 2013-03-04 DIAGNOSIS — E1142 Type 2 diabetes mellitus with diabetic polyneuropathy: Secondary | ICD-10-CM | POA: Diagnosis present

## 2013-03-04 DIAGNOSIS — W19XXXA Unspecified fall, initial encounter: Secondary | ICD-10-CM | POA: Diagnosis present

## 2013-03-04 DIAGNOSIS — Y92009 Unspecified place in unspecified non-institutional (private) residence as the place of occurrence of the external cause: Secondary | ICD-10-CM

## 2013-03-04 DIAGNOSIS — I739 Peripheral vascular disease, unspecified: Secondary | ICD-10-CM | POA: Diagnosis present

## 2013-03-04 DIAGNOSIS — E1149 Type 2 diabetes mellitus with other diabetic neurological complication: Secondary | ICD-10-CM | POA: Diagnosis present

## 2013-03-04 DIAGNOSIS — Y838 Other surgical procedures as the cause of abnormal reaction of the patient, or of later complication, without mention of misadventure at the time of the procedure: Secondary | ICD-10-CM | POA: Diagnosis not present

## 2013-03-04 DIAGNOSIS — Z113 Encounter for screening for infections with a predominantly sexual mode of transmission: Secondary | ICD-10-CM

## 2013-03-04 DIAGNOSIS — Z800 Family history of malignant neoplasm of digestive organs: Secondary | ICD-10-CM

## 2013-03-04 DIAGNOSIS — I509 Heart failure, unspecified: Secondary | ICD-10-CM | POA: Diagnosis present

## 2013-03-04 DIAGNOSIS — E1129 Type 2 diabetes mellitus with other diabetic kidney complication: Secondary | ICD-10-CM | POA: Diagnosis present

## 2013-03-04 HISTORY — PX: BURR HOLE: SHX908

## 2013-03-04 LAB — BASIC METABOLIC PANEL
BUN: 37 mg/dL — ABNORMAL HIGH (ref 6–23)
BUN: 36 mg/dL — ABNORMAL HIGH (ref 6–23)
CO2: 22 mEq/L (ref 19–32)
CO2: 22 mEq/L (ref 19–32)
Calcium: 8.4 mg/dL (ref 8.4–10.5)
Chloride: 99 mEq/L (ref 96–112)
Creatinine, Ser: 1.44 mg/dL — ABNORMAL HIGH (ref 0.50–1.35)
GFR calc Af Amer: 54 mL/min — ABNORMAL LOW (ref >90)
Glucose, Bld: 226 mg/dL — ABNORMAL HIGH (ref 70–99)
Glucose, Bld: 303 mg/dL — ABNORMAL HIGH (ref 70–99)
Potassium: 4.6 mEq/L (ref 3.5–5.1)

## 2013-03-04 LAB — GLUCOSE, CAPILLARY: Glucose-Capillary: 288 mg/dL — ABNORMAL HIGH (ref 70–99)

## 2013-03-04 LAB — CBC
HCT: 38.2 % — ABNORMAL LOW (ref 39.0–52.0)
Hemoglobin: 13.1 g/dL (ref 13.0–17.0)
MCH: 32.3 pg (ref 26.0–34.0)
MCHC: 34.3 g/dL (ref 30.0–36.0)
MCV: 94.3 fL (ref 78.0–100.0)

## 2013-03-04 LAB — CBC WITH DIFFERENTIAL/PLATELET
Eosinophils Relative: 2 % (ref 0–5)
HCT: 38.3 % — ABNORMAL LOW (ref 39.0–52.0)
Lymphocytes Relative: 14 % (ref 12–46)
Lymphs Abs: 2.1 10*3/uL (ref 0.7–4.0)
MCH: 32.9 pg (ref 26.0–34.0)
MCV: 93.4 fL (ref 78.0–100.0)
Monocytes Absolute: 1.1 10*3/uL — ABNORMAL HIGH (ref 0.1–1.0)
Monocytes Relative: 7 % (ref 3–12)
RBC: 4.1 MIL/uL — ABNORMAL LOW (ref 4.22–5.81)
WBC: 15.5 10*3/uL — ABNORMAL HIGH (ref 4.0–10.5)

## 2013-03-04 LAB — APTT: aPTT: 29 seconds (ref 24–37)

## 2013-03-04 LAB — POCT I-STAT TROPONIN I: Troponin i, poc: 0.04 ng/mL (ref 0.00–0.08)

## 2013-03-04 LAB — PROTIME-INR: INR: 1.16 (ref 0.00–1.49)

## 2013-03-04 SURGERY — CREATION, CRANIAL BURR HOLE
Anesthesia: General | Laterality: Right

## 2013-03-04 SURGERY — CREATION, CRANIAL BURR HOLE
Anesthesia: General | Laterality: Right | Wound class: Clean

## 2013-03-04 MED ORDER — ACETAMINOPHEN 650 MG RE SUPP: 650.0000 mg | RECTAL | Status: DC | PRN

## 2013-03-04 MED ORDER — SODIUM CHLORIDE 0.9 % IV SOLN
INTRAVENOUS | Status: DC
  Administered 2013-03-04 – 2013-03-05 (×2): via INTRAVENOUS

## 2013-03-04 MED ORDER — PANTOPRAZOLE SODIUM 40 MG IV SOLR
40.0000 mg | Freq: Every day | INTRAVENOUS | Status: DC
  Administered 2013-03-04 – 2013-03-06 (×3): 40 mg via INTRAVENOUS
  Filled 2013-03-04 (×6): qty 40

## 2013-03-04 MED ORDER — MORPHINE SULFATE 2 MG/ML IJ SOLN
2.0000 mg | INTRAMUSCULAR | Status: DC | PRN
  Administered 2013-03-11 – 2013-03-15 (×5): 2 mg via INTRAVENOUS
  Filled 2013-03-04 (×5): qty 1

## 2013-03-04 MED ORDER — ACETAMINOPHEN 325 MG PO TABS
650.0000 mg | ORAL_TABLET | ORAL | Status: DC | PRN
  Administered 2013-03-06 – 2013-03-18 (×6): 650 mg via ORAL
  Filled 2013-03-04 (×6): qty 2

## 2013-03-04 MED ORDER — ONDANSETRON HCL 4 MG/2ML IJ SOLN: 4.0000 mg | INTRAMUSCULAR | Status: DC | PRN

## 2013-03-04 MED ORDER — LORAZEPAM 2 MG/ML IJ SOLN
2.0000 mg | Freq: Once | INTRAMUSCULAR | Status: AC
  Administered 2013-03-04: 2 mg via INTRAVENOUS

## 2013-03-04 MED ORDER — PHENYLEPHRINE HCL 10 MG/ML IJ SOLN
INTRAMUSCULAR | Status: DC | PRN
  Administered 2013-03-04: 80 ug via INTRAVENOUS
  Administered 2013-03-04: 120 ug via INTRAVENOUS

## 2013-03-04 MED ORDER — HEMOSTATIC AGENTS (NO CHARGE) OPTIME
TOPICAL | Status: DC | PRN
  Administered 2013-03-04: 1 via TOPICAL

## 2013-03-04 MED ORDER — ONDANSETRON HCL 4 MG PO TABS: 4.0000 mg | ORAL_TABLET | Freq: Four times a day (QID) | ORAL | Status: DC | PRN

## 2013-03-04 MED ORDER — ONDANSETRON HCL 4 MG/2ML IJ SOLN
INTRAMUSCULAR | Status: DC | PRN
  Administered 2013-03-04: 4 mg via INTRAVENOUS

## 2013-03-04 MED ORDER — LABETALOL HCL 5 MG/ML IV SOLN: 10.0000 mg | INTRAVENOUS | Status: DC | PRN

## 2013-03-04 MED ORDER — AZITHROMYCIN 500 MG IV SOLR
500.0000 mg | Freq: Once | INTRAVENOUS | Status: AC
  Administered 2013-03-04: 500 mg via INTRAVENOUS
  Filled 2013-03-04: qty 500

## 2013-03-04 MED ORDER — ONDANSETRON HCL 4 MG/2ML IJ SOLN: 4.0000 mg | Freq: Four times a day (QID) | INTRAMUSCULAR | Status: DC | PRN

## 2013-03-04 MED ORDER — FENTANYL CITRATE 0.05 MG/ML IJ SOLN: 25.0000 ug | INTRAMUSCULAR | Status: DC | PRN

## 2013-03-04 MED ORDER — HYDROCODONE-ACETAMINOPHEN 5-325 MG PO TABS
1.0000 | ORAL_TABLET | ORAL | Status: DC | PRN
  Administered 2013-03-07 – 2013-03-20 (×21): 1 via ORAL
  Filled 2013-03-04 (×23): qty 1

## 2013-03-04 MED ORDER — SUCCINYLCHOLINE CHLORIDE 20 MG/ML IJ SOLN
INTRAMUSCULAR | Status: DC | PRN
  Administered 2013-03-04: 120 mg via INTRAVENOUS

## 2013-03-04 MED ORDER — ACETAMINOPHEN 10 MG/ML IV SOLN
1000.0000 mg | Freq: Once | INTRAVENOUS | Status: DC | PRN
  Filled 2013-03-04: qty 100

## 2013-03-04 MED ORDER — ETOMIDATE 2 MG/ML IV SOLN
INTRAVENOUS | Status: DC | PRN
  Administered 2013-03-04: 20 mg via INTRAVENOUS

## 2013-03-04 MED ORDER — FENTANYL CITRATE 0.05 MG/ML IJ SOLN
INTRAMUSCULAR | Status: DC | PRN
  Administered 2013-03-04 (×2): 50 ug via INTRAVENOUS

## 2013-03-04 MED ORDER — LIDOCAINE HCL (CARDIAC) 20 MG/ML IV SOLN
INTRAVENOUS | Status: DC | PRN
  Administered 2013-03-04: 20 mg via INTRAVENOUS

## 2013-03-04 MED ORDER — SODIUM CHLORIDE 0.9 % IR SOLN
Status: DC | PRN
  Administered 2013-03-04: 19:00:00

## 2013-03-04 MED ORDER — SODIUM CHLORIDE 0.9 % IJ SOLN
3.0000 mL | Freq: Two times a day (BID) | INTRAMUSCULAR | Status: DC
  Administered 2013-03-04 – 2013-03-06 (×3): 3 mL via INTRAVENOUS

## 2013-03-04 MED ORDER — SODIUM CHLORIDE 0.9 % IV SOLN
1000.0000 mg | Freq: Once | INTRAVENOUS | Status: AC
  Administered 2013-03-04: 1000 mg via INTRAVENOUS
  Filled 2013-03-04: qty 20

## 2013-03-04 MED ORDER — SODIUM CHLORIDE 0.9 % IV SOLN: 1000.0000 mg | Freq: Three times a day (TID) | INTRAVENOUS | Status: DC

## 2013-03-04 MED ORDER — DROPERIDOL 2.5 MG/ML IJ SOLN
0.6250 mg | INTRAMUSCULAR | Status: DC | PRN
  Filled 2013-03-04: qty 0.25

## 2013-03-04 MED ORDER — INSULIN ASPART 100 UNIT/ML ~~LOC~~ SOLN
0.0000 [IU] | SUBCUTANEOUS | Status: DC
  Administered 2013-03-04: 8 [IU] via SUBCUTANEOUS
  Administered 2013-03-05 (×2): 2 [IU] via SUBCUTANEOUS
  Administered 2013-03-05: 8 [IU] via SUBCUTANEOUS
  Administered 2013-03-06 – 2013-03-07 (×3): 3 [IU] via SUBCUTANEOUS
  Administered 2013-03-07: 2 [IU] via SUBCUTANEOUS

## 2013-03-04 MED ORDER — SODIUM CHLORIDE 0.9 % IV SOLN
INTRAVENOUS | Status: DC | PRN
  Administered 2013-03-04: 17:00:00 via INTRAVENOUS

## 2013-03-04 MED ORDER — SODIUM CHLORIDE 0.9 % IV SOLN
500.0000 mg | Freq: Two times a day (BID) | INTRAVENOUS | Status: DC
  Administered 2013-03-04 – 2013-03-05 (×3): 500 mg via INTRAVENOUS
  Filled 2013-03-04 (×6): qty 5

## 2013-03-04 MED ORDER — 0.9 % SODIUM CHLORIDE (POUR BTL) OPTIME
TOPICAL | Status: DC | PRN
  Administered 2013-03-04 (×2): 1000 mL

## 2013-03-04 MED ORDER — PROMETHAZINE HCL 25 MG PO TABS: 12.5000 mg | ORAL_TABLET | ORAL | Status: DC | PRN

## 2013-03-04 MED ORDER — THROMBIN 5000 UNITS EX SOLR
CUTANEOUS | Status: DC | PRN
  Administered 2013-03-04 (×2): 5000 [IU] via TOPICAL

## 2013-03-04 MED ORDER — DEXTROSE 5 % IV SOLN
1.0000 g | INTRAVENOUS | Status: DC
  Administered 2013-03-05: 1 g via INTRAVENOUS
  Filled 2013-03-04 (×2): qty 10

## 2013-03-04 MED ORDER — DEXTROSE 5 % IV SOLN
500.0000 mg | INTRAVENOUS | Status: DC
  Administered 2013-03-05: 500 mg via INTRAVENOUS
  Filled 2013-03-04 (×2): qty 500

## 2013-03-04 MED ORDER — INSULIN GLARGINE 100 UNIT/ML ~~LOC~~ SOLN
10.0000 [IU] | Freq: Every day | SUBCUTANEOUS | Status: DC
  Administered 2013-03-05: 10 [IU] via SUBCUTANEOUS
  Filled 2013-03-04 (×3): qty 0.1

## 2013-03-04 MED ORDER — ONDANSETRON HCL 4 MG PO TABS: 4.0000 mg | ORAL_TABLET | ORAL | Status: DC | PRN

## 2013-03-04 MED ORDER — HYDROMORPHONE HCL PF 1 MG/ML IJ SOLN
0.5000 mg | INTRAMUSCULAR | Status: DC | PRN
Start: 2013-03-04 — End: 2013-03-20

## 2013-03-04 MED ORDER — DEXTROSE 5 % IV SOLN
1.0000 g | Freq: Once | INTRAVENOUS | Status: AC
  Administered 2013-03-04: 1 g via INTRAVENOUS
  Filled 2013-03-04: qty 10

## 2013-03-04 SURGICAL SUPPLY — 61 items
ADH SKN CLS APL DERMABOND .7 (GAUZE/BANDAGES/DRESSINGS) ×1
BAG DECANTER FOR FLEXI CONT (MISCELLANEOUS) ×2 IMPLANT
BANDAGE GAUZE 4  KLING STR (GAUZE/BANDAGES/DRESSINGS) IMPLANT
BLADE SURG 11 STRL SS (BLADE) ×2 IMPLANT
BLADE SURG ROTATE 9660 (MISCELLANEOUS) IMPLANT
BNDG COHESIVE 4X5 TAN NS LF (GAUZE/BANDAGES/DRESSINGS) IMPLANT
BRUSH SCRUB EZ PLAIN DRY (MISCELLANEOUS) ×2 IMPLANT
BUR ACORN 6.0 (BURR) ×2 IMPLANT
BUR ADDG 1.1 (BURR) IMPLANT
CANISTER SUCTION 2500CC (MISCELLANEOUS) ×2 IMPLANT
CLIP TI MEDIUM 6 (CLIP) IMPLANT
CLOTH BEACON ORANGE TIMEOUT ST (SAFETY) ×2 IMPLANT
CONT SPEC 4OZ CLIKSEAL STRL BL (MISCELLANEOUS) ×2 IMPLANT
CORDS BIPOLAR (ELECTRODE) ×2 IMPLANT
DECANTER SPIKE VIAL GLASS SM (MISCELLANEOUS) ×2 IMPLANT
DERMABOND ADVANCED (GAUZE/BANDAGES/DRESSINGS) ×1
DERMABOND ADVANCED .7 DNX12 (GAUZE/BANDAGES/DRESSINGS) ×1 IMPLANT
DRAIN CHANNEL 10M FLAT 3/4 FLT (DRAIN) ×1 IMPLANT
DRAPE NEUROLOGICAL W/INCISE (DRAPES) IMPLANT
DRAPE WARM FLUID 44X44 (DRAPE) ×2 IMPLANT
ELECT CAUTERY BLADE 6.4 (BLADE) ×2 IMPLANT
ELECT REM PT RETURN 9FT ADLT (ELECTROSURGICAL) ×2
ELECTRODE REM PT RTRN 9FT ADLT (ELECTROSURGICAL) ×1 IMPLANT
EVACUATOR SILICONE 100CC (DRAIN) ×1 IMPLANT
GAUZE SPONGE 4X4 16PLY XRAY LF (GAUZE/BANDAGES/DRESSINGS) IMPLANT
GLOVE ECLIPSE 8.5 STRL (GLOVE) IMPLANT
GLOVE EXAM NITRILE LRG STRL (GLOVE) IMPLANT
GLOVE EXAM NITRILE MD LF STRL (GLOVE) IMPLANT
GLOVE EXAM NITRILE XL STR (GLOVE) IMPLANT
GLOVE EXAM NITRILE XS STR PU (GLOVE) IMPLANT
GOWN BRE IMP SLV AUR LG STRL (GOWN DISPOSABLE) ×2 IMPLANT
GOWN BRE IMP SLV AUR XL STRL (GOWN DISPOSABLE) ×2 IMPLANT
GOWN STRL REIN 2XL LVL4 (GOWN DISPOSABLE) ×2 IMPLANT
HEMOSTAT SURGICEL 2X14 (HEMOSTASIS) IMPLANT
HOOK DURA (MISCELLANEOUS) ×2 IMPLANT
KIT BASIN OR (CUSTOM PROCEDURE TRAY) ×2 IMPLANT
KIT ROOM TURNOVER OR (KITS) ×2 IMPLANT
NDL HYPO 25X1 1.5 SAFETY (NEEDLE) ×1 IMPLANT
NEEDLE HYPO 25X1 1.5 SAFETY (NEEDLE) ×2 IMPLANT
NS IRRIG 1000ML POUR BTL (IV SOLUTION) ×2 IMPLANT
PACK CRANIOTOMY (CUSTOM PROCEDURE TRAY) ×2 IMPLANT
PAD ARMBOARD 7.5X6 YLW CONV (MISCELLANEOUS) ×6 IMPLANT
PATTIES SURGICAL .25X.25 (GAUZE/BANDAGES/DRESSINGS) IMPLANT
PATTIES SURGICAL .5 X.5 (GAUZE/BANDAGES/DRESSINGS) IMPLANT
PATTIES SURGICAL .5 X3 (DISPOSABLE) IMPLANT
PATTIES SURGICAL 1X1 (DISPOSABLE) IMPLANT
PIN MAYFIELD SKULL DISP (PIN) IMPLANT
SPONGE GAUZE 4X4 12PLY (GAUZE/BANDAGES/DRESSINGS) ×1 IMPLANT
SPONGE NEURO XRAY DETECT 1X3 (DISPOSABLE) IMPLANT
SPONGE SURGIFOAM ABS GEL 100 (HEMOSTASIS) IMPLANT
SPONGE SURGIFOAM ABS GEL SZ50 (HEMOSTASIS) ×1 IMPLANT
STAPLER VISISTAT 35W (STAPLE) ×2 IMPLANT
SUT NURALON 4 0 TR CR/8 (SUTURE) ×4 IMPLANT
SUT VIC AB 2-0 CT1 18 (SUTURE) ×2 IMPLANT
SYR 20ML ECCENTRIC (SYRINGE) ×2 IMPLANT
SYR CONTROL 10ML LL (SYRINGE) ×2 IMPLANT
TAPE CLOTH SURG 4X10 WHT LF (GAUZE/BANDAGES/DRESSINGS) ×1 IMPLANT
TOWEL OR 17X24 6PK STRL BLUE (TOWEL DISPOSABLE) ×2 IMPLANT
TOWEL OR 17X26 10 PK STRL BLUE (TOWEL DISPOSABLE) ×2 IMPLANT
TRAY FOLEY CATH 14FRSI W/METER (CATHETERS) IMPLANT
WATER STERILE IRR 1000ML POUR (IV SOLUTION) ×2 IMPLANT

## 2013-03-04 NOTE — Progress Notes (Deleted)
SCHEDULED IV ACETAMINOPHEN:  CONVERSION TO ORAL ROUTE to complete the ordered doses.  The Pharmacy and Therapeutics Committee has restricted administration of IV acetaminophen (with a 24 hr maximum duration) to patients who meet both of the following criteria:  Unable to tolerate oral or enteral medication  Contraindication to NSAIDs  Because the patient has taken other oral medications today, IV acetaminophen has been converted to PO to complete the course of therapy originally ordered.  If PO acetaminophen should be continued beyond the original stop time, please adjust the order accordingly using the "modify" function.   If you have questions about this conversion, please contact the pharmacy department.  Nadara Mustard, PharmD., MS Clinical Pharmacist Pager:  [phone] Thank you for allowing pharmacy to be part of this patients care team. 03/04/2013 10:12 PM

## 2013-03-04 NOTE — Anesthesia Postprocedure Evaluation (Signed)
  Anesthesia Post-op Note  Patient: [name]  Procedure(s) Performed: Procedure(s) with comments: BURR HOLES (Right) - [name]   Patient Location: PACU  Anesthesia Type:General  Level of Consciousness: awake and sedated  Airway and Oxygen Therapy: Patient Spontanous Breathing  Post-op Pain: none  Post-op Assessment: Post-op Vital signs reviewed, Patient's Cardiovascular Status Stable, Respiratory Function Stable, Patent Airway, No signs of Nausea or vomiting and Pain level controlled  Post-op Vital Signs: stable  Complications: No apparent anesthesia complications

## 2013-03-04 NOTE — ED Notes (Signed)
Per family, pt cannot urinate on his own, will have to in/out cath

## 2013-03-04 NOTE — Transfer of Care (Signed)
Immediate Anesthesia Transfer of Care Note  Patient: [name]  Procedure(s) Performed: Procedure(s): BURR HOLES (Right)  Patient Location: PACU  Anesthesia Type:General  Level of Consciousness: awake and patient cooperative  Airway & Oxygen Therapy: Patient Spontanous Breathing and Patient connected to nasal cannula oxygen  Post-op Assessment: Report given to PACU RN and Post -op Vital signs reviewed and stable  Post vital signs: Reviewed and stable  Complications: No apparent anesthesia complications

## 2013-03-04 NOTE — Anesthesia Preprocedure Evaluation (Addendum)
Anesthesia Evaluation  Patient identified by MRN, date of birth, ID band Patient awake    Reviewed: Allergy & Precautions, H&P , NPO status , Patient's Chart, lab work & pertinent test results, Unable to perform ROS - Chart review only  History of Anesthesia Complications Negative for: history of anesthetic complications  Airway Mallampati: I TM Distance: >3 FB Neck ROM: Full    Dental  (+) Edentulous Upper and Edentulous Lower   Pulmonary pneumonia - (recent pneumonia and URI), former smoker,  breath sounds clear to auscultation  Pulmonary exam normal       Cardiovascular hypertension, Pt. on medications and Pt. on home beta blockers + CAD, + Past MI, + Peripheral Vascular Disease (s/p B BKA) and +CHF Rhythm:Regular Rate:Normal  2006 acute posterior myocardial infarction, cardiac catheterization at that time demonstrated significant three-vessel disease. A stent was placed to the first obtuse marginal, which was felt to  be the culprit vessel.  It was noted at that time the left ventricularfunction was severely depressed with an ejection fraction estimated to be in the range of 22-25%.    Neuro/Psych Chronic right subdural hematoma, left sided weakness   Neuromuscular disease (bilateral BKA's)    GI/Hepatic Neg liver ROS, GERD-  Poorly Controlled,  Endo/Other  diabetes (glu 226), Type 2, Insulin Dependent  Renal/GU Renal InsufficiencyRenal diseaseBUN 37 Creatinine 1.44     Musculoskeletal   Abdominal (+) + obese,   Peds  Hematology  (+) Blood dyscrasia (on plavix), ,   Anesthesia Other Findings   Reproductive/Obstetrics                         Anesthesia Physical Anesthesia Plan  ASA: III and emergent  Anesthesia Plan: General   Post-op Pain Management:    Induction: Intravenous  Airway Management Planned: Oral ETT  Additional Equipment:   Intra-op Plan:   Post-operative Plan:  Extubation in OR  Informed Consent: I have reviewed the patients History and Physical, chart, labs and discussed the procedure including the risks, benefits and alternatives for the proposed anesthesia with the patient or authorized representative who has indicated his/her understanding and acceptance.     Plan Discussed with: Anesthesiologist, Surgeon and CRNA  Anesthesia Plan Comments: (Plan routine monitors, GETA)       Anesthesia Quick Evaluation

## 2013-03-04 NOTE — Consult Note (Signed)
Reason for Consult: Subdural hematoma Referring Physician: Dr. Dia Crawford [name] is an 79 y.o. male.  HPI: 79 year old male with multiple medical problems including diabetes mellitus hypertension coronary artery disease, congestive heart failure, history of testicular cancer, history of liposarcoma, and possible recently diagnosed pneumonia presents with a one-month history of progressive failure to thrive at home. Over the past 36 hours the patient is exhibited significant left-sided weakness. Over the past 24 hours he has begun to complain of headaches. He said no seizures. He has suffered multiple falls but cannot recall any one specific particular fall that was noteworthy.  Past Medical History  Diagnosis Date  . Diabetes mellitus   . Hypertension   . Testicular cancer dx'd 08/2010    surg only  . CHF (congestive heart failure)   . CAD (coronary artery disease)   . Peripheral vascular disease   . Vitamin B12 deficiency   . Diabetic retinopathy   . Diabetic peripheral neuropathy   . Cholelithiasis   . Family history of colon cancer     brother   . 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 Below knee leg amputation  2005    Right  . Below knee leg amputation  2006    Left  . Appendectomy    . Eye surgery  2012    Laser  . Ptca      Family History  Problem Relation Age of Onset  . Colon cancer Brother     Social History:  reports that he has quit smoking. He has never used smokeless tobacco. He reports that he does not drink alcohol or use illicit drugs.  Allergies: No Known Allergies  Medications: I have reviewed the patient's current medications.  Results for orders placed during the hospital encounter of 03/04/13 (from the past 48 hour(s))  CBC WITH DIFFERENTIAL     Status: Abnormal   Collection Time    03/04/13  2:34 PM      Result Value Range   WBC 15.5 (*) 4.0 - 10.5 K/uL   RBC 4.10 (*) 4.22 - 5.81 MIL/uL    Hemoglobin 13.5  13.0 - 17.0 g/dL   HCT 45.4 (*) 09.8 - 11.9 %   MCV 93.4  78.0 - 100.0 fL   MCH 32.9  26.0 - 34.0 pg   MCHC 35.2  30.0 - 36.0 g/dL   RDW 14.7  82.9 - 56.2 %   Platelets 293  150 - 400 K/uL   Neutrophils Relative % 78 (*) 43 - 77 %   Neutro Abs 12.0 (*) 1.7 - 7.7 K/uL   Lymphocytes Relative 14  12 - 46 %   Lymphs Abs 2.1  0.7 - 4.0 K/uL   Monocytes Relative 7  3 - 12 %   Monocytes Absolute 1.1 (*) 0.1 - 1.0 K/uL   Eosinophils Relative 2  0 - 5 %   Eosinophils Absolute 0.2  0.0 - 0.7 K/uL   Basophils Relative 0  0 - 1 %   Basophils Absolute 0.1  0.0 - 0.1 K/uL  BASIC METABOLIC PANEL     Status: Abnormal   Collection Time    03/04/13  2:34 PM      Result Value Range   Sodium 132 (*) 135 - 145 mEq/L   Potassium 4.4  3.5 - 5.1 mEq/L   Chloride 99  96 - 112 mEq/L   CO2 22  19 - 32 mEq/L   Glucose, Bld  226 (*) 70 - 99 mg/dL   BUN 37 (*) 6 - 23 mg/dL   Creatinine, Ser 0.98 (*) 0.50 - 1.35 mg/dL   Calcium 8.4  8.4 - 11.9 mg/dL   GFR calc non Af Amer 45 (*) >90 mL/min   GFR calc Af Amer 53 (*) >90 mL/min   Comment:            The eGFR has been calculated     using the CKD EPI equation.     This calculation has not been     validated in all clinical     situations.     eGFR's persistently     <90 mL/min signify     possible Chronic Kidney Disease.  POCT I-STAT TROPONIN I     Status: None   Collection Time    03/04/13  2:50 PM      Result Value Range   Troponin i, poc 0.04  0.00 - 0.08 ng/mL   Comment 3            Comment: Due to the release kinetics of cTnI,     a negative result within the first hours     of the onset of symptoms does not rule out     myocardial infarction with certainty.     If myocardial infarction is still suspected,     repeat the test at appropriate intervals.  PROTIME-INR     Status: None   Collection Time    03/04/13  3:57 PM      Result Value Range   Prothrombin Time 14.6  11.6 - 15.2 seconds   INR 1.16  0.00 - 1.49  APTT      Status: None   Collection Time    03/04/13  3:57 PM      Result Value Range   aPTT 29  24 - 37 seconds    Dg Chest 2 View  03/04/2013   *RADIOLOGY REPORT*  Clinical Data: Stroke, hypertension, diabetes  CHEST - 2 VIEW  Comparison: 01/26/2012  Findings: Cardiomediastinal silhouette is stable.  No pulmonary edema.  There is worsening streaky bilateral basilar airspace suspicious for superimposed infiltrate on chronic atelectasis or scarring.  Stable degenerative changes thoracic spine.  IMPRESSION:  There is worsening streaky bilateral basilar airspace suspicious for superimposed infiltrate on chronic atelectasis or scarring. Stable degenerative changes thoracic spine.   Original Report Authenticated By: Natasha Mead, M.D.   Ct Head Wo Contrast  03/04/2013   *RADIOLOGY REPORT*  Clinical Data: Left-sided weakness  CT HEAD WITHOUT CONTRAST  Technique:  Contiguous axial images were obtained from the base of the skull through the vertex without contrast. Study was obtained within 24 hours of patient arrival at the emergency department.  Comparison: None.  Findings: There is a large subdural hematoma which appears chronic on the right which is causing shift of the midline structures toward the left by a distance of approximately 1.6 cm.  The greatest amount of fluid is seen in the frontal region with a maximum thickness of 3.9 cm.  There is effacement of sulci throughout the right supratentorial region.  There is no intra-axial fluid.  There is no acute appearing infarct.  There is effacement of ventricles.  The fourth ventricle is in the midline, however.  The bony calvarium appears intact.  The mastoid air cells are clear.  IMPRESSION: Large chronic appearing right-sided subdural hematoma causing mass effect and midline shift of the structures to the left.  There is  effacement of essentially all sulci to the right of midline.  No acute appearing infarct seen.  Comment:  This report was communicated directly by  phone to Dr.Plunkett, emergency department physician, immediately upon review of the study.   Original Report Authenticated By: Bretta Bang, M.D.    Pertinent items are noted in HPI. Blood pressure 106/60, pulse 76, temperature 98.3 F (36.8 C), temperature source Oral, resp. rate 18, SpO2 95.00%. Patient is awake and aware. He is oriented and reasonably appropriate albeit somewhat blunted. Speech is fluent. Cognition appears intact. Cranial nerve function demonstrates evidence of moderate left-sided facial weakness. His tongue protrudes to the left. Otherwise cranial nerve function appears intact. Motor examination extremities demonstrates evidence of significant left upper extremity weakness grating out at 4 minus over 5. Lower trimming strength is difficult to assess secondary to previous bilateral lower extremity amputations but he certainly is less spontaneous and less strong with his left lower extremity.Examination head ears eyes and throat is unremarkable. Chest as scattered rhonchi and rails. The patient is not appear to be any respiratory distress however. Abdomen is obese but soft. heart regular rate and rhythm. Extremities demonstrate bilateral lower extremity amputation but no other obvious abnormalities.   Assessment/Plan: Large right frontal chronic subdural hematoma with marked mass effect. Given the size of this extra-axial fluid collection couple of the patient's symptoms I think his best option for improvement lyses with decompression by means of burr hole evacuation of the subdural fluid collection. I discussed the risks and the benefits involved with a right-sided burr hole evacuation of subdural hematoma including but not limited to the risk of anesthesia, bleeding, infection, brain injury including stroke coma and/or death, recurrence of bleeding, and non-benefit. The patient is aware of these risks and wishes to proceed. We will move forward an emergent fashion  today.  [name] A 03/04/2013, 4:19 PM

## 2013-03-04 NOTE — Op Note (Signed)
Date of procedure: 03/04/2013  Date of dictation: Same  Service: Neurosurgery  Preoperative diagnosis: Right convexity chronic posttraumatic subdural hematoma  Postoperative diagnosis: Same  Procedure Name: Right frontal parietal burr hole with evacuation of chronic subdural hematoma. Placement of subdural drain.  Surgeon:Koi Zangara A.Kayen Grabel, M.D.  Asst. Surgeon: None  Anesthesia: General  Indication: 79 year old male with multiple medical problems presents with worsening headache and left-sided weakness. Workup demonstrates evidence of a very large chronic subdural hematoma with marked mass effect on the right side. Patient presents now for burr hole evacuation hopes of improving his symptoms.  Operative note: After induction anesthesia, patient positioned supine with his head turned to the left. Patient's right scalp prepped and draped. Incision made in the right frontoparietal region. Self-retaining retractor placed. A large single burr hole was placed. Dura was coagulated. Dura was then incised in a cruciate fashion and the dural leaflets were burned back to the edges of the burr hole. Subdural fluid under pressure was allowed to drain. The fluid was dark in consistent with old blood. Wound was irrigated. A subdural membrane was elevated and incised. This was partially stripped off the underlying brain. A subdural drain was left in the subdural space and exited through separate stab incision. Wound was closed with Vicryl sutures at the galea and staples at the surface. There were no apparent complications. Patient tolerated the procedure well and he returns to the recovery postop.

## 2013-03-04 NOTE — ED Notes (Addendum)
Per EMS pt from home where he lives with son and wife.  Son reported to EMS that pt has had left sided weakness and inability to move left side X3 days.  Pt reports abdominal pain and has not eaten since yesterday due to abdominal pain.  EMS reports abdomin not distended not tender.  Pt rt amputation below right knee.  No IV , Pt alert oriented X5 EMS reports pt smells of UTI

## 2013-03-04 NOTE — Brief Op Note (Signed)
03/04/2013  6:42 PM  PATIENT:  [name]  79 y.o. male  PRE-OPERATIVE DIAGNOSIS:  Chronic Subdural Hematoma  POST-OPERATIVE DIAGNOSIS:  * No post-op diagnosis entered *  PROCEDURE:  Procedure(s): BURR HOLES (Right)  SURGEON:  Surgeon(s) and Role:    * Temple Pacini, MD - Primary  PHYSICIAN ASSISTANT:   ASSISTANTS: none   ANESTHESIA:   general  EBL:  Total I/O In: 400 [I.V.:400] Out: -   BLOOD ADMINISTERED:none  DRAINS: (10 mm Blake drain) Jackson-Pratt drain(s) with closed bulb suction in the Subdural space   LOCAL MEDICATIONS USED:  NONE  SPECIMEN:  No Specimen  DISPOSITION OF SPECIMEN:  N/A  COUNTS:  YES  TOURNIQUET:  * No tourniquets in log *  DICTATION: .Dragon Dictation  PLAN OF CARE: Admit to inpatient   PATIENT DISPOSITION:  PACU - hemodynamically stable.   Delay start of Pharmacological VTE agent (>24hrs) due to surgical blood loss or risk of bleeding: yes

## 2013-03-04 NOTE — H&P (Signed)
PCP:   [name], MD   Chief Complaint:  Left sided weakness  HPI: Mr. [name] is a 79 year old African American male with a history of diabetes mellitus with vascular, renal, neurologic, and ophthalmologic complications, coronary artery disease, and peripheral vascular disease status post bilateral BKA who presented emergency department with increased left-sided weakness x 36 hours. The patient has experienced a significant decline in functional status over the past 3-4 weeks. His wife initially called stating that he was having increasing difficulty standing and that she could no longer care for him at home.  She requested placement in a nursing facility. She did not feel that he was able to come into the office due to his weakness. We arranged home health social work, Charity fundraiser, and physical therapy which began last week to evaluate him and to assist with placement options.  Over the past week he has had 2 falls.  EMS was called for the first of these and evaluated him.  He was not brought to the ER since there was no apparent injury. 5 days ago he experienced a second fall out of his wheelchair. His wife thinks he may have hit his head with both of these but did not appear to have any injury.  He was able to work with physical therapy last week but continued to have generalized weakness. His wife also noticed slight increase in confusion and urinary incontinence. [name], his son has been lifting him into his motorized wheelchair noticed that he was very weak on his left side. This was confirmed by the physical therapist today who called our office. We recommended that he come the emergency department for evaluation where he was found to have a large chronic subdural hematoma on head CT. Chest x-ray also showed possible pneumonia. Family does report these been coughing recently with increased cough with eating as well. Neurosurgery has evaluated him and recommends emergent evacuation. They requested  medical admission due to his medical complexity and possible pneumonia  Review of Systems:  Review of Systems - All systems reviewed with patient and are negative as in history of present illness the following exceptions: Mild headache.  No apparent loss of consciousness, decreased appetite, oral intake.    Past Medical History: Past Medical History  Diagnosis Date  . Diabetes mellitus   . Hypertension   . Testicular cancer dx'd 08/2010    surg only  . CHF (congestive heart failure)   . CAD (coronary artery disease)   . Peripheral vascular disease   . Vitamin B12 deficiency   . Diabetic retinopathy   . Diabetic peripheral neuropathy   . Cholelithiasis   . Family history of colon cancer     brother   . 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 Below knee leg amputation  2005    Right  . Below knee leg amputation  2006    Left  . Appendectomy    . Eye surgery  2012    Laser  . Ptca      Medications: Prior to Admission medications   Medication Sig Start Date End Date Taking? Authorizing Provider  aspirin 325 MG tablet Take 325 mg by mouth daily.   Yes Historical Provider, MD  atorvastatin (LIPITOR) 40 MG tablet Take 40 mg by mouth daily.   Yes Historical Provider, MD  carvedilol (COREG) 6.25 MG tablet Take 6.25 mg by mouth daily. 02/04/13  Yes Historical Provider, MD  clopidogrel (PLAVIX) 75 MG tablet Take  75 mg by mouth daily.     Yes Historical Provider, MD  furosemide (LASIX) 20 MG tablet Take 20 mg by mouth daily. As needed    Yes Historical Provider, MD  HYDROcodone-acetaminophen (LORTAB) 7.5-500 MG per tablet Take 1 tablet by mouth every 6 (six) hours as needed.   Yes Historical Provider, MD  insulin detemir (LEVEMIR) 100 UNIT/ML injection Inject 8 Units into the skin at bedtime.   Yes Historical Provider, MD  insulin glargine (LANTUS) 100 UNIT/ML injection Inject 10 Units into the skin daily before breakfast.     Yes Historical  Provider, MD  insulin regular (HUMULIN R,NOVOLIN R) 100 units/mL injection Inject 4 Units into the skin 3 (three) times daily before meals.     Yes Historical Provider, MD  lisinopril (PRINIVIL,ZESTRIL) 10 MG tablet Take 10 mg by mouth daily.     Yes Historical Provider, MD  simvastatin (ZOCOR) 40 MG tablet Take 40 mg by mouth at bedtime.     Yes Historical Provider, MD  Tamsulosin HCl (FLOMAX) 0.4 MG CAPS Take 0.4 mg by mouth daily.     Yes Historical Provider, MD  traMADol (ULTRAM) 50 MG tablet Take 50 mg by mouth every 6 (six) hours. 12/14/12  Yes Historical Provider, MD  traMADol-acetaminophen (ULTRACET) 37.5-325 MG per tablet Take 1 tablet by mouth every 6 (six) hours as needed.   Yes Historical Provider, MD  VESICARE 5 MG tablet Take 5 mg by mouth daily. 02/04/13  Yes Historical Provider, MD  ZETIA 10 MG tablet Take 10 mg by mouth daily. 02/04/13  Yes Historical Provider, MD    Allergies:  No Known Allergies  Social History: He is married with one son in [place] and a daughter in [place]. He is 4 grandchildren and 4 step grandchildren. He completed the eighth grade. He is retired Engineer, technical sales". Smoked one pack per day for 15 years but quit more than 20 years ago. Occasional alcohol.  Family History: Family History  Problem Relation Age of Onset  . Colon cancer Brother     Physical Exam: Filed Vitals:   03/04/13 1353 03/04/13 1354 03/04/13 1601  BP: 119/55  106/60  Pulse: 81  76  Temp: 98.7 F (37.1 C)  98.3 F (36.8 C)  TempSrc: Oral  Oral  Resp: 24  18  SpO2: 88% 91% 95%   General appearance: Awake, alert and oriented to year, person, and place Head: Normocephalic, without obvious abnormality, atraumatic Eyes: conjunctivae/corneas clear. PERRL, EOM's intact.  Nose: Nares normal. Septum midline. Mucosa normal. No drainage or sinus tenderness. Throat: lips, mucosa, and tongue normal; teeth and gums normal Neck: no adenopathy, no carotid bruit, no JVD and thyroid not  enlarged, symmetric, no tenderness/mass/nodules Resp: Bilateral rhonchi throughout Cardio: regular rate and rhythm GI: soft, non-tender; bowel sounds normal; no masses,  no organomegaly Extremities: Status post bilateral BKA, multiple ecchymoses over extremities; no ulceration noted Lymph nodes: Cervical adenopathy: no cervical lymphadenopathy Neurologic: Alert and oriented X 3, left arm and leg weakness; no facial asymmetry    Labs on Admission:   Recent Labs  03/04/13 1434  NA 132*  K 4.4  CL 99  CO2 22  GLUCOSE 226*  BUN 37*  CREATININE 1.44*  CALCIUM 8.4   No results found for this basename: AST, ALT, ALKPHOS, BILITOT, PROT, ALBUMIN,  in the last 72 hours No results found for this basename: LIPASE, AMYLASE,  in the last 72 hours  Recent Labs  03/04/13 1434  WBC 15.5*  NEUTROABS 12.0*  HGB 13.5  HCT 38.3*  MCV 93.4  PLT 293    Lab Results  Component Value Date   INR 1.16 03/04/2013   INR 1.09 11/14/2010   INR 1.0 12/05/2007    Radiological Exams on Admission: Dg Chest 2 View  03/04/2013   *RADIOLOGY REPORT*  Clinical Data: Stroke, hypertension, diabetes  CHEST - 2 VIEW  Comparison: 01/26/2012  Findings: Cardiomediastinal silhouette is stable.  No pulmonary edema.  There is worsening streaky bilateral basilar airspace suspicious for superimposed infiltrate on chronic atelectasis or scarring.  Stable degenerative changes thoracic spine.  IMPRESSION:  There is worsening streaky bilateral basilar airspace suspicious for superimposed infiltrate on chronic atelectasis or scarring. Stable degenerative changes thoracic spine.   Original Report Authenticated By: Natasha Mead, M.D.   Ct Head Wo Contrast  03/04/2013   *RADIOLOGY REPORT*  Clinical Data: Left-sided weakness  CT HEAD WITHOUT CONTRAST  Technique:  Contiguous axial images were obtained from the base of the skull through the vertex without contrast. Study was obtained within 24 hours of patient arrival at the emergency  department.  Comparison: None.  Findings: There is a large subdural hematoma which appears chronic on the right which is causing shift of the midline structures toward the left by a distance of approximately 1.6 cm.  The greatest amount of fluid is seen in the frontal region with a maximum thickness of 3.9 cm.  There is effacement of sulci throughout the right supratentorial region.  There is no intra-axial fluid.  There is no acute appearing infarct.  There is effacement of ventricles.  The fourth ventricle is in the midline, however.  The bony calvarium appears intact.  The mastoid air cells are clear.  IMPRESSION: Large chronic appearing right-sided subdural hematoma causing mass effect and midline shift of the structures to the left.  There is effacement of essentially all sulci to the right of midline.  No acute appearing infarct seen.  Comment:  This report was communicated directly by phone to Dr.Plunkett, emergency department physician, immediately upon review of the study.   Original Report Authenticated By: Bretta Bang, M.D.   Ct Abdomen Pelvis W Contrast  02/12/2013   *RADIOLOGY REPORT*  Clinical Data: Follow-up for right testicular liposarcoma. Previous orchiectomy.  CT ABDOMEN AND PELVIS WITH CONTRAST  Technique:  Multidetector CT imaging of the abdomen and pelvis was performed following the standard protocol during bolus administration of intravenous contrast.  Contrast: OMNIPAQUE IOHEXOL 300 MG/ML  SOLN  Comparison: Abdomen CT only on 01/26/2012 and abdomen pelvis CT on 07/12/2011  Findings: Postop changes from prior right orchiectomy again demonstrated.  No evidence of right inguinal lymphadenopathy.  Mild left inguinal lymphadenopathy is again seen which measures 17 mm in short axis on image 91 is not simply change since prior study. Small less than 1 cm bilateral external iliac lymph nodes remain stable, and no other pathologically enlarged nodes are seen within the pelvis.  No  evidence of retroperitoneal lymphadenopathy.  The liver, gallbladder, pancreas, spleen, and adrenal glands are normal in appearance.  No other soft tissue masses are identified. No evidence of inflammatory process or abnormal fluid collections. TURP defect again noted.  No evidence of bowel wall thickening, dilatation, or hernia.  No suspicious bone lesions identified.  Images through the lung bases show poorly-defined rounded areas of airspace disease in the left lower lobe and posterior lingula, which are new since prior exam. This appearance is nonspecific, but favors pneumonia over metastatic disease.  IMPRESSION:  1.  Stable mild left inguinal lymphadenopathy. 2.  No new or progressive disease within the abdomen or pelvis. 3.  New ill-defined areas of airspace opacity in the left lower lobe and posterior lingula. Differential diagnosis includes pneumonia with metastatic disease considered less likely. Recommend clinical correlation and further evaluation by chest radiograph.   Original Report Authenticated By: Myles Rosenthal, M.D.     Assessment/Plan Principal Problem: 1.Chronic subdural hematoma-timing of his hematoma is unclear in the setting of aspirin/Plavix use and several recent falls secondary to his declining functional status. Has been evaluated by neurosurgery who recommends emergent evacuation tonight. Postoperative management per neurosurgery.  Active Problems: 2. Community acquired pneumonia- he has received Rocephin and azithromycin in the emergency department. We'll continue these to complete a seven-day course. He is at risk for aspiration pneumonia with his subdural hematoma which may be the underlying cause.  Will obtain speech therapy evaluation prior to initiating diet. 3. DIABETES MELLITUS- fairly well-controlled at home. We'll continue low-dose Lantus and cover with sliding scale insulin every 4 hours. He does have both Levemir and Lantus listed on his hospital medication  reconciliation which is incorrect. 4. HYPERTENSION- we'll hold his antihypertensive medications and resume as needed postoperatively. 5. CAD- we'll hold aspirin and Plavix due to #1. No recent cardiac events.  Obtain preop EKG. 6. PVD s/p Bilateral BKA- resume medical therapy as able after surgery. 7. CHF, chronic systolic failure (EF 20-25%)- resume medical therapy as able after surgery.  Monitor daily weights and I/Os. 7. Disposition-will need SNF rehab placement given recent functional decline.  Tayson Schnelle,W DOUGLAS 03/04/2013, 5:19 PM

## 2013-03-04 NOTE — Anesthesia Procedure Notes (Signed)
Procedure Name: Intubation Date/Time: 03/04/2013 5:55 PM Performed by: Arlice Colt B Pre-anesthesia Checklist: Patient identified, Emergency Drugs available, Suction available, Patient being monitored and Timeout performed Patient Re-evaluated:Patient Re-evaluated prior to inductionOxygen Delivery Method: Circle system utilized Preoxygenation: Pre-oxygenation with 100% oxygen Intubation Type: IV induction and Rapid sequence Laryngoscope Size: Mac and 3 Grade View: Grade I Tube type: Oral Tube size: 7.5 mm Number of attempts: 1 Airway Equipment and Method: Stylet Placement Confirmation: positive ETCO2,  breath sounds checked- equal and bilateral and ETT inserted through vocal cords under direct vision Secured at: 22 cm Tube secured with: Tape Dental Injury: Teeth and Oropharynx as per pre-operative assessment

## 2013-03-04 NOTE — ED Provider Notes (Addendum)
CSN: [id number]     Arrival date & time 03/04/13  1346 History     First MD Initiated Contact with Patient 03/04/13 1412     Chief Complaint  Patient presents with  . Weakness   (Consider location/radiation/quality/duration/timing/severity/associated sxs/prior Treatment) Patient is a 79 y.o. male presenting with extremity weakness. The history is provided by the patient and the spouse.  Extremity Weakness This is a new problem. Episode onset: 3 days ago. The problem occurs constantly. The problem has not changed since onset.Associated symptoms include shortness of breath. Pertinent negatives include no chest pain, no abdominal pain and no headaches. Associated symptoms comments: Also wife states that he has had some trouble with speech and intermittent trouble swallowing.  When PT tried to work with him on Friday he was having difficulty using his left side.  Family states he has fell out of his power wheelchair multiple times in the last month and has hit his head.  . Nothing aggravates the symptoms. Nothing relieves the symptoms. He has tried nothing for the symptoms. The treatment provided no relief.    Past Medical History  Diagnosis Date  . Diabetes mellitus   . Hypertension   . Testicular cancer dx'd 08/2010    surg only  . CHF (congestive heart failure)   . CAD (coronary artery disease)   . Peripheral vascular disease   . Vitamin B12 deficiency   . Diabetic retinopathy   . Diabetic peripheral neuropathy   . Cholelithiasis   . Family history of colon cancer     brother   . 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 Below knee leg amputation  2005    Right  . Below knee leg amputation  2006    Left  . Appendectomy    . Eye surgery  2012    Laser  . Ptca     Family History  Problem Relation Age of Onset  . Colon cancer Brother    History  Substance Use Topics  . Smoking status: Former Games developer  . Smokeless tobacco: Never Used  .  Alcohol Use: No    Review of Systems  Constitutional: Negative for fever.  Respiratory: Positive for cough and shortness of breath.   Cardiovascular: Negative for chest pain.  Gastrointestinal: Negative for nausea, vomiting, abdominal pain and diarrhea.  Musculoskeletal: Positive for extremity weakness.  Neurological: Positive for speech difficulty, weakness and numbness. Negative for headaches.  All other systems reviewed and are negative.    Allergies  Review of patient's allergies indicates no known allergies.  Home Medications   Current Outpatient Rx  Name  Route  Sig  Dispense  Refill  . aspirin 325 MG tablet   Oral   Take 325 mg by mouth daily.         Marland Kitchen atorvastatin (LIPITOR) 40 MG tablet   Oral   Take 40 mg by mouth daily.         . carvedilol (COREG) 6.25 MG tablet   Oral   Take 6.25 mg by mouth daily.         . clopidogrel (PLAVIX) 75 MG tablet   Oral   Take 75 mg by mouth daily.           . furosemide (LASIX) 20 MG tablet   Oral   Take 20 mg by mouth daily. As needed          . HYDROcodone-acetaminophen (LORTAB) 7.5-500 MG per tablet  Oral   Take 1 tablet by mouth every 6 (six) hours as needed.         . insulin detemir (LEVEMIR) 100 UNIT/ML injection   Subcutaneous   Inject 8 Units into the skin at bedtime.         . insulin glargine (LANTUS) 100 UNIT/ML injection   Subcutaneous   Inject 10 Units into the skin daily before breakfast.           . insulin regular (HUMULIN R,NOVOLIN R) 100 units/mL injection   Subcutaneous   Inject 4 Units into the skin 3 (three) times daily before meals.           Marland Kitchen lisinopril (PRINIVIL,ZESTRIL) 10 MG tablet   Oral   Take 10 mg by mouth daily.           . simvastatin (ZOCOR) 40 MG tablet   Oral   Take 40 mg by mouth at bedtime.           . Tamsulosin HCl (FLOMAX) 0.4 MG CAPS   Oral   Take 0.4 mg by mouth daily.           . traMADol (ULTRAM) 50 MG tablet   Oral   Take 50 mg by  mouth every 6 (six) hours.         . traMADol-acetaminophen (ULTRACET) 37.5-325 MG per tablet   Oral   Take 1 tablet by mouth every 6 (six) hours as needed.         . VESICARE 5 MG tablet   Oral   Take 5 mg by mouth daily.         Marland Kitchen ZETIA 10 MG tablet   Oral   Take 10 mg by mouth daily.          BP 119/55  Pulse 81  Temp(Src) 98.7 F (37.1 C) (Oral)  Resp 24  SpO2 91% Physical Exam  Nursing note and vitals reviewed. Constitutional: He is oriented to person, place, and time. He appears well-developed and well-nourished. No distress.  HENT:  Head: Normocephalic and atraumatic.  Mouth/Throat: Oropharynx is clear and moist.  Eyes: Conjunctivae and EOM are normal. Pupils are equal, round, and reactive to light.  Neck: Normal range of motion. Neck supple.  Cardiovascular: Normal rate, regular rhythm and intact distal pulses.   No murmur heard. Pulmonary/Chest: Effort normal. No respiratory distress. He has no wheezes. He has rhonchi in the left middle field and the left lower field. He has no rales.  Abdominal: Soft. He exhibits no distension. There is no tenderness. There is no rebound and no guarding.  Musculoskeletal: Normal range of motion. He exhibits no edema and no tenderness.  Bilateral BKA  Neurological: He is alert and oriented to person, place, and time. A sensory deficit is present.  Decreased sensation in the left upper and lower extremity. Weakness in the left upper and lower extremity 3/5 compared to 5 out of 5 on the right. No facial involvement. No facial droop or sensation abnormalities. Normal speech without dysarthria and normal swallow.  Skin: Skin is warm and dry. No rash noted. No erythema.  Psychiatric: He has a normal mood and affect. His behavior is normal.    ED Course   Procedures (including critical care time)  Labs Reviewed  CBC WITH DIFFERENTIAL - Abnormal; Notable for the following:    WBC 15.5 (*)    RBC 4.10 (*)    HCT 38.3 (*)     Neutrophils Relative %  78 (*)    Neutro Abs 12.0 (*)    Monocytes Absolute 1.1 (*)    All other components within normal limits  BASIC METABOLIC PANEL - Abnormal; Notable for the following:    Sodium 132 (*)    Glucose, Bld 226 (*)    BUN 37 (*)    Creatinine, Ser 1.44 (*)    GFR calc non Af Amer 45 (*)    GFR calc Af Amer 53 (*)    All other components within normal limits  URINALYSIS, ROUTINE W REFLEX MICROSCOPIC  PROTIME-INR  APTT  POCT I-STAT TROPONIN I    Date: 03/04/2013  Rate: 82  Rhythm: normal sinus rhythm and premature atrial contractions (PAC)  QRS Axis: normal  Intervals: normal  ST/T Wave abnormalities: nonspecific ST/T changes  Conduction Disutrbances:none  Narrative Interpretation:   Old EKG Reviewed: unchanged   Dg Chest 2 View  03/04/2013   *RADIOLOGY REPORT*  Clinical Data: Stroke, hypertension, diabetes  CHEST - 2 VIEW  Comparison: 01/26/2012  Findings: Cardiomediastinal silhouette is stable.  No pulmonary edema.  There is worsening streaky bilateral basilar airspace suspicious for superimposed infiltrate on chronic atelectasis or scarring.  Stable degenerative changes thoracic spine.  IMPRESSION:  There is worsening streaky bilateral basilar airspace suspicious for superimposed infiltrate on chronic atelectasis or scarring. Stable degenerative changes thoracic spine.   Original Report Authenticated By: Natasha Mead, M.D.   Ct Head Wo Contrast  03/04/2013   *RADIOLOGY REPORT*  Clinical Data: Left-sided weakness  CT HEAD WITHOUT CONTRAST  Technique:  Contiguous axial images were obtained from the base of the skull through the vertex without contrast. Study was obtained within 24 hours of patient arrival at the emergency department.  Comparison: None.  Findings: There is a large subdural hematoma which appears chronic on the right which is causing shift of the midline structures toward the left by a distance of approximately 1.6 cm.  The greatest amount of fluid is seen  in the frontal region with a maximum thickness of 3.9 cm.  There is effacement of sulci throughout the right supratentorial region.  There is no intra-axial fluid.  There is no acute appearing infarct.  There is effacement of ventricles.  The fourth ventricle is in the midline, however.  The bony calvarium appears intact.  The mastoid air cells are clear.  IMPRESSION: Large chronic appearing right-sided subdural hematoma causing mass effect and midline shift of the structures to the left.  There is effacement of essentially all sulci to the right of midline.  No acute appearing infarct seen.  Comment:  This report was communicated directly by phone to Dr.Benjimin Hadden, emergency department physician, immediately upon review of the study.   Original Report Authenticated By: Bretta Bang, M.D.   1. Subdural hematoma, chronic   2. CAP (community acquired pneumonia)     MDM   Patient lives at home with his son and his son called EMS due to left-sided arm weakness today. Looking through patient recently had a CT of his abdomen and pelvis done for routine screening for known testicular cancer. At that time he was found to have new left lower lobe lung infiltrate that was not further evaluated.  Pt was hypoxic on arrival to 88 with improvement to the 90's with oxygen.  He has weakness/numbness on the left upper/lower ext without facial involvement.  He states his arm has been like this for awhile however poor historian and son is not currently present.  Concern for infection versus CVA.  EKG is unchanged from prior.  CBC, BMP, UA, head CT and chest x-ray pending.  Will attempt to speak with son for further history.  3:04 PM Wife states since Thursday he had left-sided weakness which is not improving and some speech difficulty. He has not had stroke based on the length of time of symptoms but no prior history of stroke and feel that he will need a workup for further care.  3:32 PM Chest x-ray showed signs  concerning for pneumonia and white cell count of 15,000. Head CT significant for a large chronic-appearing subdural hematoma with midline shift which is most likely the cause of his left-sided weakness. We'll discuss with neurosurgery. We'll also start on Rocephin and azithromycin for presumed pneumonia.  Gwyneth Sprout, MD 03/04/13 1533  Gwyneth Sprout, MD 03/04/13 1610  Gwyneth Sprout, MD 03/11/13 1330

## 2013-03-05 ENCOUNTER — Encounter (HOSPITAL_COMMUNITY): Payer: Self-pay | Admitting: Neurosurgery

## 2013-03-05 LAB — CBC
Hemoglobin: 11.8 g/dL — ABNORMAL LOW (ref 13.0–17.0)
MCHC: 33.3 g/dL (ref 30.0–36.0)
RDW: 14.2 % (ref 11.5–15.5)
WBC: 15.5 10*3/uL — ABNORMAL HIGH (ref 4.0–10.5)

## 2013-03-05 LAB — GLUCOSE, CAPILLARY: Glucose-Capillary: 112 mg/dL — ABNORMAL HIGH (ref 70–99)

## 2013-03-05 LAB — BASIC METABOLIC PANEL
Chloride: 103 mEq/L (ref 96–112)
GFR calc Af Amer: 55 mL/min — ABNORMAL LOW (ref >90)
GFR calc non Af Amer: 47 mL/min — ABNORMAL LOW (ref >90)
Potassium: 4.3 mEq/L (ref 3.5–5.1)
Sodium: 135 mEq/L (ref 135–145)

## 2013-03-05 LAB — HEMOGLOBIN A1C
Hgb A1c MFr Bld: 7.8 % — ABNORMAL HIGH (ref <5.7)
Mean Plasma Glucose: 177 mg/dL — ABNORMAL HIGH (ref <117)

## 2013-03-05 NOTE — Evaluation (Signed)
Occupational Therapy Evaluation Patient Details Name: [name] MRN: [id number] DOB: 12-23-1932 Today's Date: 03/05/2013 Time: 4401-0272 OT Time Calculation (min): 30 min  OT Assessment / Plan / Recommendation History of present illness [name] is a 79 year old African American male with a history of diabetes mellitus with vascular, renal, neurologic, and ophthalmologic complications, coronary artery disease, and peripheral vascular disease status post bilateral BKA who presented emergency department with increased left-sided weakness x 36 hours. The patient has experienced a significant decline in functional status over the past 3-4 weeks. His wife initially called stating that he was having increasing difficulty standing and that she could no longer care for him at home.  She requested placement in a nursing facility. She did not feel that he was able to come into the office due to his weakness. We arranged home health social work, Charity fundraiser, and physical therapy which began last week to evaluate him and to assist with placement options.  Over the past week he has had 2 falls.  EMS was called for the first of these and evaluated him.  He was not brought to the ER since there was no apparent injury. 5 days ago he experienced a second fall out of his wheelchair. His wife thinks he may have hit his head with both of these but did not appear to have any injury.  He was able to work with physical therapy last week but continued to have generalized weakness. His wife also noticed slight increase in confusion and urinary incontinence. [name], his son has been lifting him into his motorized wheelchair noticed that he was very weak on his left side. This was confirmed by the physical therapist today who called our office. We recommended that he come the emergency department for evaluation where he was found to have a large chronic subdural hematoma on head CT. Chest x-ray also showed possible pneumonia. Family  does report these been coughing recently with increased cough with eating as well. Neurosurgery has evaluated him and recommends emergent evacuation. They requested medical admission due to his medical complexity and possible pneumonia   Clinical Impression   Pt presents with the numerous deficits listed below.  Feel pt would benefit from cont OT to address these deficits and attempt to get pt back to baseline for adls so he can eventually d/c home with his wife.  Pt will need SNF level of care prior to returning home.    OT Assessment  Patient needs continued OT Services    Follow Up Recommendations  SNF    Barriers to Discharge Decreased caregiver support Lives with wife but may need more assist at this point.  Equipment Recommendations  Other (comment) (to be determined)    Recommendations for Other Services    Frequency  Min 2X/week    Precautions / Restrictions Precautions Precautions: Fall Precaution Comments: Pt has had 2-3 recent falls with steady decline over last month Restrictions Weight Bearing Restrictions: No Other Position/Activity Restrictions: Pt is B BKA. Does not use prostetics to walk.   Pertinent Vitals/Pain Pt BP 84/44 in supine.  Only went to EOB.  Other vitals stable.    ADL  Eating/Feeding: Simulated;+1 Total assistance Where Assessed - Eating/Feeding: Bed level Grooming: Performed;+1 Total assistance;Wash/dry face;Wash/dry hands Where Assessed - Grooming: Supported sitting Upper Body Bathing: Simulated;+1 Total assistance Where Assessed - Upper Body Bathing: Supine, head of bed up Lower Body Bathing: Simulated;+1 Total assistance Where Assessed - Lower Body Bathing: Supine, head of bed up Upper Body Dressing:  Simulated;+1 Total assistance Where Assessed - Upper Body Dressing: Supine, head of bed up Lower Body Dressing: Simulated;+1 Total assistance Where Assessed - Lower Body Dressing: Supine, head of bed up Toilet Transfer:  (pt too lethargic to  attempt transfers) Transfers/Ambulation Related to ADLs: Pt transferred to EOB with total assist.  Pt nonambulatory. ADL Comments: pt D with all adls at this time b/c of lethargy.  Pt did assist wife PTA with bathing and dressing but has had assist in this area for quite some time now.  pt was able to do simple transfers a month ago w/o assist.    OT Diagnosis: Generalized weakness;Cognitive deficits;Acute pain;Hemiplegia non-dominant side  OT Problem List: Decreased strength;Decreased range of motion;Decreased activity tolerance;Impaired balance (sitting and/or standing);Impaired vision/perception;Decreased coordination;Decreased cognition;Decreased knowledge of use of DME or AE;Impaired sensation;Impaired tone;Impaired UE functional use;Pain;Increased edema OT Treatment Interventions: Self-care/ADL training;Therapeutic activities;Therapeutic exercise   OT Goals(Current goals can be found in the care plan section) Acute Rehab OT Goals Patient Stated Goal: none stated. OT Goal Formulation: With patient/family Time For Goal Achievement: 03/19/13 Potential to Achieve Goals: Fair ADL Goals Pt Will Perform Eating: with min assist;sitting Pt Will Perform Grooming: with min assist;sitting Pt Will Perform Upper Body Bathing: with min assist;sitting Additional ADL Goal #1: Pt will transfer to EOB with mod assist in prep for adls on EOB.  Visit Information  Last OT Received On: 03/05/13 Assistance Needed: +2 PT/OT Co-Evaluation/Treatment: Yes History of Present Illness: [name] is a 79 year old African American male with a history of diabetes mellitus with vascular, renal, neurologic, and ophthalmologic complications, coronary artery disease, and peripheral vascular disease status post bilateral BKA who presented emergency department with increased left-sided weakness x 36 hours. The patient has experienced a significant decline in functional status over the past 3-4 weeks. His wife initially  called stating that he was having increasing difficulty standing and that she could no longer care for him at home.  She requested placement in a nursing facility. She did not feel that he was able to come into the office due to his weakness. We arranged home health social work, Charity fundraiser, and physical therapy which began last week to evaluate him and to assist with placement options.  Over the past week he has had 2 falls.  EMS was called for the first of these and evaluated him.  He was not brought to the ER since there was no apparent injury. 5 days ago he experienced a second fall out of his wheelchair. His wife thinks he may have hit his head with both of these but did not appear to have any injury.  He was able to work with physical therapy last week but continued to have generalized weakness. His wife also noticed slight increase in confusion and urinary incontinence. [name], his son has been lifting him into his motorized wheelchair noticed that he was very weak on his left side. This was confirmed by the physical therapist today who called our office. We recommended that he come the emergency department for evaluation where he was found to have a large chronic subdural hematoma on head CT. Chest x-ray also showed possible pneumonia. Family does report these been coughing recently with increased cough with eating as well. Neurosurgery has evaluated him and recommends emergent evacuation. They requested medical admission due to his medical complexity and possible pneumonia       Prior Functioning     Home Living Family/patient expects to be discharged to:: Private residence Living Arrangements: Spouse/significant  other Available Help at Discharge: Family;Available 24 hours/day;Other (Comment) (not sure if wife able to assist at this level) Type of Home: House Home Access: Ramped entrance Home Layout: One level Home Equipment: Wheelchair - power Prior Function Level of Independence: Needs  assistance Gait / Transfers Assistance Needed: over last week to 1 month PTA pt need progressively more assist ADL's / Homemaking Assistance Needed: Progressively more assist over last month from ability to brush teeth, put on clothes and eat to unable to do by himself. Communication Communication: Other (comment) (mumbles, low phonation) Dominant Hand: Right         Vision/Perception Vision - History Baseline Vision: Other (comment) Visual History: Retinopathy Patient Visual Report: Other (comment) (unable to evaluate.  Pt eyes closed most of time.) Vision - Assessment Eye Alignment: Within Functional Limits Vision Assessment: Vision not tested Additional Comments: Unable to fully assess b/c eyes closed most of time.     Cognition  Cognition Arousal/Alertness: Lethargic Behavior During Therapy: Flat affect (lethargic) Overall Cognitive Status: Impaired/Different from baseline Area of Impairment: Attention;Orientation;Following commands;Problem solving Memory: Decreased short-term memory Following Commands: Follows one step commands with increased time;Follows one step commands inconsistently Problem Solving: Slow processing    Extremity/Trunk Assessment Upper Extremity Assessment Upper Extremity Assessment: LUE deficits/detail;RUE deficits/detail RUE Deficits / Details:  Pt with very hight tone vs. pt just resisting movement on the R but active movement was noted throughout. RUE: Unable to fully assess due to immobilization LUE Deficits / Details: No active movement noted in LUE.  Tone possibly noted in LUE.   LUE: Unable to fully assess due to immobilization LUE Sensation: decreased light touch LUE Coordination: decreased fine motor;decreased gross motor Lower Extremity Assessment Lower Extremity Assessment: RLE deficits/detail;LLE deficits/detail;Difficult to assess due to impaired cognition RLE Deficits / Details: difficult to assess, moved spontaneously LLE Deficits /  Details: minimal movement when assisted, no voluntary movement noted Cervical / Trunk Assessment Cervical / Trunk Assessment: Other exceptions (flexed neck at all times.) Cervical / Trunk Exceptions: Pt unable to hold head up.     Mobility Bed Mobility Bed Mobility: Supine to Sit;Sitting - Scoot to [place] of Bed;Sit to Supine Supine to Sit: 1: +2 Total assist;HOB elevated Supine to Sit: Patient Percentage: 0% Sitting - Scoot to Edge of Bed: 1: +2 Total assist Sitting - Scoot to Edge of Bed: Patient Percentage: 10% Sit to Supine: 1: +2 Total assist Sit to Supine: Patient Percentage: 10% Details for Bed Mobility Assistance: Initially pt not fully aroused and did not assist, at EOB was mildly resistant likely due to fear of being at EOB,  Some truncal reaction on return to supine. Transfers Details for Transfer Assistance: no transfers completed.     Exercise Other Exercises Other Exercises: Basic PROM to U and LE's   Balance Balance Balance Assessed: Yes Static Sitting Balance Static Sitting - Balance Support: Right upper extremity supported;Left upper extremity supported;Bilateral upper extremity supported;Feet unsupported Static Sitting - Level of Assistance: 3: Mod assist;2: Max assist Static Sitting - Comment/# of Minutes: EOB greater than 15 min working on arousal, truncal activation, attention, assessment of L sided function, postural alignment, specifically cervical alignment.   End of Session OT - End of Session Activity Tolerance: Patient limited by lethargy Patient left: in bed;with call bell/phone within reach;with family/visitor present Nurse Communication: Mobility status  GO     Hope Budds 03/05/2013, 1:05 PM [phone]

## 2013-03-05 NOTE — Discharge Planning (Signed)
This patient is an active patient with [facility]. Long term placement was being worked on per family request due to wife feeling like she wasn't able to take care of him at home. If you have questions please call me. Thank you. Ayesha Rumpf RN, BSN [facility] [phone]

## 2013-03-05 NOTE — Progress Notes (Signed)
Subjective: Underwent Right frontal parietal burr hole with evacuation of chronic subdural hematoma and placement of subdural drain last night.  Will open eyes for RN but will not follow commands.   Objective: Vital signs in last 24 hours: Temp:  [97.3 F (36.3 C)-99.2 F (37.3 C)] 99.2 F (37.3 C) (07/29 0408) Pulse Rate:  [44-103] 60 (07/29 0700) Resp:  [15-27] 21 (07/29 0700) BP: (79-119)/(38-68) 89/40 mmHg (07/29 0700) SpO2:  [86 %-100 %] 100 % (07/29 0700) Weight:  [103.2 kg (227 lb 8.2 oz)-103.6 kg (228 lb 6.3 oz)] 103.6 kg (228 lb 6.3 oz) (07/29 0400) Weight change:     CBG (last 3)   Recent Labs  03/04/13 2104 03/05/13 0013 03/05/13 0406  GLUCAP 288* 277* 143*    Intake/Output from previous day: 07/28 0701 - 07/29 0700 In: 915 [I.V.:915] Out: 1120 [Urine:945; Drains:175] Intake/Output this shift:    General appearance: will groan to stimulus but will not follow commands Eyes: no scleral icterus Throat: oropharynx moist without erythema Resp: clear to auscultation bilaterally Cardio: regular rate and rhythm GI: soft, non-tender; bowel sounds normal; no masses,  no organomegaly Extremities: no clubbing, cyanosis or edema   Lab Results:  Recent Labs  03/04/13 2116 03/05/13 0340  NA 132* 135  K 4.6 4.3  CL 99 103  CO2 22 24  GLUCOSE 303* 169*  BUN 36* 36*  CREATININE 1.41* 1.39*  CALCIUM 8.1* 8.0*   No results found for this basename: AST, ALT, ALKPHOS, BILITOT, PROT, ALBUMIN,  in the last 72 hours  Recent Labs  03/04/13 1434 03/04/13 2116 03/05/13 0340  WBC 15.5* 15.5* 15.5*  NEUTROABS 12.0*  --   --   HGB 13.5 13.1 11.8*  HCT 38.3* 38.2* 35.4*  MCV 93.4 94.3 94.9  PLT 293 238 268   Lab Results  Component Value Date   INR 1.16 03/04/2013   INR 1.09 11/14/2010   INR 1.0 12/05/2007   No results found for this basename: CKTOTAL, CKMB, CKMBINDEX, TROPONINI,  in the last 72 hours No results found for this basename: TSH, T4TOTAL, FREET3,  T3FREE, THYROIDAB,  in the last 72 hours No results found for this basename: VITAMINB12, FOLATE, FERRITIN, TIBC, IRON, RETICCTPCT,  in the last 72 hours  Studies/Results: Dg Chest 2 View  03/04/2013   *RADIOLOGY REPORT*  Clinical Data: Stroke, hypertension, diabetes  CHEST - 2 VIEW  Comparison: 01/26/2012  Findings: Cardiomediastinal silhouette is stable.  No pulmonary edema.  There is worsening streaky bilateral basilar airspace suspicious for superimposed infiltrate on chronic atelectasis or scarring.  Stable degenerative changes thoracic spine.  IMPRESSION:  There is worsening streaky bilateral basilar airspace suspicious for superimposed infiltrate on chronic atelectasis or scarring. Stable degenerative changes thoracic spine.   Original Report Authenticated By: Natasha Mead, M.D.   Ct Head Wo Contrast  03/04/2013   *RADIOLOGY REPORT*  Clinical Data: Left-sided weakness  CT HEAD WITHOUT CONTRAST  Technique:  Contiguous axial images were obtained from the base of the skull through the vertex without contrast. Study was obtained within 24 hours of patient arrival at the emergency department.  Comparison: None.  Findings: There is a large subdural hematoma which appears chronic on the right which is causing shift of the midline structures toward the left by a distance of approximately 1.6 cm.  The greatest amount of fluid is seen in the frontal region with a maximum thickness of 3.9 cm.  There is effacement of sulci throughout the right supratentorial region.  There is  no intra-axial fluid.  There is no acute appearing infarct.  There is effacement of ventricles.  The fourth ventricle is in the midline, however.  The bony calvarium appears intact.  The mastoid air cells are clear.  IMPRESSION: Large chronic appearing right-sided subdural hematoma causing mass effect and midline shift of the structures to the left.  There is effacement of essentially all sulci to the right of midline.  No acute appearing  infarct seen.  Comment:  This report was communicated directly by phone to Dr.Plunkett, emergency department physician, immediately upon review of the study.   Original Report Authenticated By: Bretta Bang, M.D.     Medications: Scheduled: . azithromycin  500 mg Intravenous Q24H  . cefTRIAXone (ROCEPHIN)  IV  1 g Intravenous Q24H  . insulin aspart  0-15 Units Subcutaneous Q4H  . insulin glargine  10 Units Subcutaneous QAC breakfast  . levETIRAcetam  500 mg Intravenous Q12H  . pantoprazole (PROTONIX) IV  40 mg Intravenous QHS  . sodium chloride  3 mL Intravenous Q12H   Continuous: . sodium chloride 50 mL/hr at 03/05/13 0700    Assessment/Plan: Principal Problem: 1.Chronic subdural hematoma- post-op management per Neurosurgery. Active Problems: 2. Community acquired pneumonia- continue Rocephin and Azithro for CAP vs. Aspiration pneumonia.  Speech Therapy evaluation when more awake for swallowing evaluation. 3. DIABETES MELLITUS- continue low dose basal insulin and SSI q4 hours.   4. HYPERTENSION- anti-HTN on hold due to relative hypotension. 5. CAD- resume medical therapy when stable. 6. CHF- appears euvolemic.  Continue gentle hydration due to hypotension. 7. PVD s/p BKA- ASA, Plavix on hold due to #1. 8. Disposition- anticipate discharge to SNF when stable per NSU.  PT/OT/ST evals when stable.   LOS: 1 day   Grainger Mccarley,W DOUGLAS 03/05/2013, 7:58 AM

## 2013-03-05 NOTE — Progress Notes (Signed)
Postop day 1.Patient suffered a generalized seizure upon emerging from anesthesia yesterday. He is then profoundly post ictal since that time. He is just starting to come around and make progress. He is currently awake. He will status name. He states where he is. He is unclear on the time. Pupils are equal at 2 mm bilaterally. Extraocular movements appear full. Facial movement symmetric bilaterally. Tongue protrudes to midline. Patient follows commands briskly with the right side and has good strength of his right upper extremity. Patient flexes minimally on the left side consistent with a persistent Todd's paralysis.  Afebrile. Vitals are stable. Drain output moderate. Labs stable.  Status post burr hole evacuation large right convexity subdural hematoma with postoperative complication of seizure. No further seizures since surgery. Patient progressively improving. Continue ICU observation. Followup head CT scan in morning.

## 2013-03-05 NOTE — Progress Notes (Signed)
SLP Cancellation Note  Patient Details Name: [name] MRN: [id number] DOB: [birth year]   Cancelled treatment:       Reason Eval/Treat Not Completed: Patient's level of consciousness. Pt not alert enough for eval yet. May try back in pm. Discussed with RN.  Harlon Ditty, [place] CCC-SLP [phone]  [name] 03/05/2013, 8:17 AM

## 2013-03-05 NOTE — Evaluation (Signed)
Physical Therapy Evaluation Patient Details Name: Francois Elk MRN: [id number] DOB: 22-Sep-1932 Today's Date: 03/05/2013 Time: 7829-5621 PT Time Calculation (min): 30 min  PT Assessment / Plan / Recommendation History of Present Illness  Mr. Malveaux is a 79 year old African American male with a history of diabetes mellitus with vascular, renal, neurologic, and ophthalmologic complications, coronary artery disease, and peripheral vascular disease status post bilateral BKA who presented emergency department with increased left-sided weakness x 36 hours. The patient has experienced a significant decline in functional status over the past 3-4 weeks. His wife initially called stating that he was having increasing difficulty standing and that she could no longer care for him at home.  She requested placement in a nursing facility. She did not feel that he was able to come into the office due to his weakness. We arranged home health social work, Charity fundraiser, and physical therapy which began last week to evaluate him and to assist with placement options.  Over the past week he has had 2 falls.  EMS was called for the first of these and evaluated him.  He was not brought to the ER since there was no apparent injury. 5 days ago he experienced a second fall out of his wheelchair. His wife thinks he may have hit his head with both of these but did not appear to have any injury.  He was able to work with physical therapy last week but continued to have generalized weakness. His wife also noticed slight increase in confusion and urinary incontinence. [name], his son has been lifting him into his motorized wheelchair noticed that he was very weak on his left side. This was confirmed by the physical therapist today who called our office. We recommended that he come the emergency department for evaluation where he was found to have a large chronic subdural hematoma on head CT. Chest x-ray also showed possible pneumonia. Family  does report these been coughing recently with increased cough with eating as well. Neurosurgery has evaluated him and recommends emergent evacuation. They requested medical admission due to his medical complexity and possible pneumonia  Clinical Impression  Pt presents with the numerous deficits listed below. Feel pt would benefit from cont PT to address these deficits and attempt to get pt back to baseline for adls so he can eventually d/c home with his wife. Pt will need SNF level of care prior to returning home.     PT Assessment  Patient needs continued PT services    Follow Up Recommendations  SNF    Does the patient have the potential to tolerate intense rehabilitation      Barriers to Discharge        Equipment Recommendations  Other (comment) (TBa post acute)    Recommendations for Other Services     Frequency Min 3X/week    Precautions / Restrictions Precautions Precautions: Fall Precaution Comments: Pt has had 2-3 recent falls with steady decline over last month Restrictions Weight Bearing Restrictions: No Other Position/Activity Restrictions: Pt is B BKA. Does not use prostetics to walk.   Pertinent Vitals/Pain       Mobility  Bed Mobility Bed Mobility: Supine to Sit;Sitting - Scoot to Edge of Bed;Sit to Supine Supine to Sit: 1: +2 Total assist;HOB elevated Supine to Sit: Patient Percentage: 0% Sitting - Scoot to Edge of Bed: 1: +2 Total assist Sitting - Scoot to Edge of Bed: Patient Percentage: 10% Sit to Supine: 1: +2 Total assist Sit to Supine: Patient Percentage: 10% Details for  Bed Mobility Assistance: Initially pt not fully aroused and did not assist, at EOB was mildly resistant likely due to fear of being at EOB,  Some truncal reaction on return to supine. Transfers Transfers: Not assessed Details for Transfer Assistance: no transfers completed. Ambulation/Gait Ambulation/Gait Assistance: Not tested (comment) Stairs: No    Exercises Other  Exercises Other Exercises: Basic PROM to U and LE's   PT Diagnosis: Generalized weakness;Hemiplegia non-dominant side  PT Problem List: Decreased strength;Decreased range of motion;Decreased activity tolerance;Decreased balance;Decreased mobility;Decreased coordination;Decreased knowledge of use of DME;Pain PT Treatment Interventions: Functional mobility training;Therapeutic activities;Balance training;Neuromuscular re-education;Patient/family education     PT Goals(Current goals can be found in the care plan section) Acute Rehab PT Goals Patient Stated Goal: none stated. PT Goal Formulation: Patient unable to participate in goal setting Time For Goal Achievement: 03/19/13 Potential to Achieve Goals: Good  Visit Information  Last PT Received On: 03/05/13 Assistance Needed: +2 PT/OT Co-Evaluation/Treatment: Yes History of Present Illness: Mr. Micalizzi is a 79 year old African American male with a history of diabetes mellitus with vascular, renal, neurologic, and ophthalmologic complications, coronary artery disease, and peripheral vascular disease status post bilateral BKA who presented emergency department with increased left-sided weakness x 36 hours. The patient has experienced a significant decline in functional status over the past 3-4 weeks. His wife initially called stating that he was having increasing difficulty standing and that she could no longer care for him at home.  She requested placement in a nursing facility. She did not feel that he was able to come into the office due to his weakness. We arranged home health social work, Charity fundraiser, and physical therapy which began last week to evaluate him and to assist with placement options.  Over the past week he has had 2 falls.  EMS was called for the first of these and evaluated him.  He was not brought to the ER since there was no apparent injury. 5 days ago he experienced a second fall out of his wheelchair. His wife thinks he may have hit his  head with both of these but did not appear to have any injury.  He was able to work with physical therapy last week but continued to have generalized weakness. His wife also noticed slight increase in confusion and urinary incontinence. [name], his son has been lifting him into his motorized wheelchair noticed that he was very weak on his left side. This was confirmed by the physical therapist today who called our office. We recommended that he come the emergency department for evaluation where he was found to have a large chronic subdural hematoma on head CT. Chest x-ray also showed possible pneumonia. Family does report these been coughing recently with increased cough with eating as well. Neurosurgery has evaluated him and recommends emergent evacuation. They requested medical admission due to his medical complexity and possible pneumonia       Prior Functioning  Home Living Family/patient expects to be discharged to:: Private residence Living Arrangements: Spouse/significant other Available Help at Discharge: Family;Available 24 hours/day;Other (Comment) (not sure if wife able to assist at this level) Type of Home: House Home Access: Ramped entrance Home Layout: One level Home Equipment: Wheelchair - power Prior Function Level of Independence: Needs assistance Gait / Transfers Assistance Needed: over last week to 1 month PTA pt need progressively more assist ADL's / Homemaking Assistance Needed: Progressively more assist over last month from ability to brush teeth, put on clothes and eat to unable to do by himself.  Communication Communication: Other (comment) (mumbles, low phonation) Dominant Hand: Right    Cognition  Cognition Arousal/Alertness: Lethargic Behavior During Therapy: Flat affect (lethargic) Overall Cognitive Status: Impaired/Different from baseline Area of Impairment: Attention;Orientation;Following commands;Problem solving Memory: Decreased short-term memory Following  Commands: Follows one step commands with increased time;Follows one step commands inconsistently Problem Solving: Slow processing    Extremity/Trunk Assessment Upper Extremity Assessment Upper Extremity Assessment: LUE deficits/detail;RUE deficits/detail RUE Deficits / Details:  Pt with very hight tone vs. pt just resisting movement on the R but active movement was noted throughout. RUE: Unable to fully assess due to immobilization LUE Deficits / Details: No active movement noted in LUE.  Tone possibly noted in LUE.   LUE: Unable to fully assess due to immobilization LUE Sensation: decreased light touch LUE Coordination: decreased fine motor;decreased gross motor Lower Extremity Assessment Lower Extremity Assessment: RLE deficits/detail;LLE deficits/detail;Difficult to assess due to impaired cognition RLE Deficits / Details: difficult to assess, moved spontaneously LLE Deficits / Details: minimal movement when assisted, no voluntary movement noted Cervical / Trunk Assessment Cervical / Trunk Assessment: Other exceptions (flexed neck at all times.) Cervical / Trunk Exceptions: Pt unable to hold head up.   Balance Balance Balance Assessed: Yes Static Sitting Balance Static Sitting - Balance Support: Right upper extremity supported;Left upper extremity supported;Bilateral upper extremity supported;Feet unsupported Static Sitting - Level of Assistance: 3: Mod assist;2: Max assist Static Sitting - Comment/# of Minutes: EOB greater than 15 min working on arousal, truncal activation, attention, assessment of L sided function, postural alignment, specifically cervical alignment.  End of Session PT - End of Session Activity Tolerance: Patient tolerated treatment well Patient left: in bed;with call bell/phone within reach;with bed alarm set;with family/visitor present Nurse Communication: Mobility status  GP     Dewitte Vannice, Eliseo Gum 03/05/2013, 1:18 PM 03/05/2013  Goltry Bing,  PT [phone]  (pager)

## 2013-03-05 NOTE — Progress Notes (Signed)
Dr. Jordan Likes at bedside and made aware of patients blood pressure and loc. Will continue to monitor

## 2013-03-06 ENCOUNTER — Inpatient Hospital Stay (HOSPITAL_COMMUNITY): Payer: Medicare Other

## 2013-03-06 LAB — CBC
Hemoglobin: 13 g/dL (ref 13.0–17.0)
MCH: 32.3 pg (ref 26.0–34.0)
MCHC: 33.9 g/dL (ref 30.0–36.0)

## 2013-03-06 LAB — GLUCOSE, CAPILLARY
Glucose-Capillary: 116 mg/dL — ABNORMAL HIGH (ref 70–99)
Glucose-Capillary: 119 mg/dL — ABNORMAL HIGH (ref 70–99)
Glucose-Capillary: 178 mg/dL — ABNORMAL HIGH (ref 70–99)

## 2013-03-06 LAB — BASIC METABOLIC PANEL
BUN: 22 mg/dL (ref 6–23)
Calcium: 8.4 mg/dL (ref 8.4–10.5)
GFR calc non Af Amer: 78 mL/min — ABNORMAL LOW (ref >90)
Glucose, Bld: 114 mg/dL — ABNORMAL HIGH (ref 70–99)
Sodium: 137 mEq/L (ref 135–145)

## 2013-03-06 MED ORDER — AMOXICILLIN-POT CLAVULANATE 875-125 MG PO TABS
1.0000 | ORAL_TABLET | Freq: Two times a day (BID) | ORAL | Status: DC
  Administered 2013-03-07 – 2013-03-10 (×7): 1 via ORAL
  Filled 2013-03-06 (×9): qty 1

## 2013-03-06 MED ORDER — INSULIN GLARGINE 100 UNIT/ML ~~LOC~~ SOLN
8.0000 [IU] | Freq: Every day | SUBCUTANEOUS | Status: DC
  Administered 2013-03-06 – 2013-03-09 (×4): 8 [IU] via SUBCUTANEOUS
  Filled 2013-03-06 (×5): qty 0.08

## 2013-03-06 MED ORDER — LEVETIRACETAM 500 MG PO TABS
500.0000 mg | ORAL_TABLET | Freq: Two times a day (BID) | ORAL | Status: DC
  Administered 2013-03-06 – 2013-03-20 (×28): 500 mg via ORAL
  Filled 2013-03-06 (×30): qty 1

## 2013-03-06 NOTE — Progress Notes (Signed)
Subjective: More alert- answering questions.  Complains of mild headache but no other complaints.    Objective: Vital signs in last 24 hours: Temp:  [97.8 F (36.6 C)-98.5 F (36.9 C)] 98.1 F (36.7 C) (07/30 0000) Pulse Rate:  [60-69] 63 (07/30 0500) Resp:  [10-22] 19 (07/30 0500) BP: (83-151)/(40-66) 151/59 mmHg (07/30 0500) SpO2:  [90 %-100 %] 100 % (07/30 0500) Weight change:     CBG (last 3)   Recent Labs  03/05/13 2017 03/06/13 0015 03/06/13 0344  GLUCAP 113* 116* 86    Intake/Output from previous day: 07/29 0701 - 07/30 0700 In: 2237.5 [I.V.:1622.5; IV Piggyback:615] Out: 1320 [Urine:1290; Drains:30] Intake/Output this shift: Total I/O In: 855 [I.V.:750; IV Piggyback:105] Out: 700 [Urine:700]  General appearance: awake, answers questions appropriately- oriented to place, year, thought he was here due to "cancer" Eyes: no scleral icterus Throat: oropharynx moist without erythema Resp: clear to auscultation bilaterally Cardio: regular rate and rhythm GI: soft, non-tender; bowel sounds normal; no masses,  no organomegaly Extremities: no clubbing, cyanosis or edema Neuro: 4+/5 strength on left, 5-/5 strength on right   Lab Results:  Recent Labs  03/04/13 2116 03/05/13 0340  NA 132* 135  K 4.6 4.3  CL 99 103  CO2 22 24  GLUCOSE 303* 169*  BUN 36* 36*  CREATININE 1.41* 1.39*  CALCIUM 8.1* 8.0*   No results found for this basename: AST, ALT, ALKPHOS, BILITOT, PROT, ALBUMIN,  in the last 72 hours  Recent Labs  03/04/13 1434 03/04/13 2116 03/05/13 0340  WBC 15.5* 15.5* 15.5*  NEUTROABS 12.0*  --   --   HGB 13.5 13.1 11.8*  HCT 38.3* 38.2* 35.4*  MCV 93.4 94.3 94.9  PLT 293 238 268   Lab Results  Component Value Date   INR 1.16 03/04/2013   INR 1.09 11/14/2010   INR 1.0 12/05/2007    Studies/Results: Dg Chest 2 View  03/04/2013   *RADIOLOGY REPORT*  Clinical Data: Stroke, hypertension, diabetes  CHEST - 2 VIEW  Comparison: 01/26/2012   Findings: Cardiomediastinal silhouette is stable.  No pulmonary edema.  There is worsening streaky bilateral basilar airspace suspicious for superimposed infiltrate on chronic atelectasis or scarring.  Stable degenerative changes thoracic spine.  IMPRESSION:  There is worsening streaky bilateral basilar airspace suspicious for superimposed infiltrate on chronic atelectasis or scarring. Stable degenerative changes thoracic spine.   Original Report Authenticated By: Natasha Mead, M.D.   Ct Head Wo Contrast  03/04/2013   *RADIOLOGY REPORT*  Clinical Data: Left-sided weakness  CT HEAD WITHOUT CONTRAST  Technique:  Contiguous axial images were obtained from the base of the skull through the vertex without contrast. Study was obtained within 24 hours of patient arrival at the emergency department.  Comparison: None.  Findings: There is a large subdural hematoma which appears chronic on the right which is causing shift of the midline structures toward the left by a distance of approximately 1.6 cm.  The greatest amount of fluid is seen in the frontal region with a maximum thickness of 3.9 cm.  There is effacement of sulci throughout the right supratentorial region.  There is no intra-axial fluid.  There is no acute appearing infarct.  There is effacement of ventricles.  The fourth ventricle is in the midline, however.  The bony calvarium appears intact.  The mastoid air cells are clear.  IMPRESSION: Large chronic appearing right-sided subdural hematoma causing mass effect and midline shift of the structures to the left.  There is effacement of  essentially all sulci to the right of midline.  No acute appearing infarct seen.  Comment:  This report was communicated directly by phone to Dr.Plunkett, emergency department physician, immediately upon review of the study.   Original Report Authenticated By: Bretta Bang, M.D.     Medications: Scheduled: . azithromycin  500 mg Intravenous Q24H  . cefTRIAXone (ROCEPHIN)   IV  1 g Intravenous Q24H  . insulin aspart  0-15 Units Subcutaneous Q4H  . insulin glargine  10 Units Subcutaneous QAC breakfast  . levETIRAcetam  500 mg Intravenous Q12H  . pantoprazole (PROTONIX) IV  40 mg Intravenous QHS  . sodium chloride  3 mL Intravenous Q12H   Continuous: . sodium chloride 75 mL/hr at 03/06/13 0500    Assessment/Plan: Principal Problem:  1.Chronic subdural hematoma- improving mental status and left sided weakness.  Post-op management per Neurosurgery.  Active Problems:  2. Community acquired pneumonia- continue Rocephin and Azithro for CAP vs. Aspiration pneumonia. Speech Therapy evaluation for swallowing evaluation.  Afebrile.  WBC pending.    3. DIABETES MELLITUS- decrease Lantus to 8 units daily due to borderline low sugars.  Continue SSI q4 hours.  4. HYPERTENSION- anti-HTN on hold due to relative hypotension. BP trending up.  Resume per NSU 5. CAD- resume medical therapy when stable.  6. CHF- appears euvolemic. Continue gentle hydration due to hypotension.  7. PVD s/p BKA- ASA, Plavix on hold due to #1.  8. Disposition- anticipate discharge to SNF when stable per NSU. Continue PT/OT/ST.    LOS: 2 days   Rima [name],W [name] 03/06/2013, 5:56 AM

## 2013-03-06 NOTE — Progress Notes (Signed)
UR completed 

## 2013-03-06 NOTE — Progress Notes (Signed)
Pt's PIV unable to flush, c/o pain at site. Bedside RN and 2 IV team RNs unable to start new PIV. Neurosurgeon on call notified and would like to follow pharmacy recommendation to change IV antibiotics to Augmentin 875 mg Q12 h.

## 2013-03-06 NOTE — Evaluation (Signed)
Clinical/Bedside Swallow Evaluation Patient Details  Name: [name] MRN: [id number] Date of Birth: 18-Apr-1933  Today's Date: 03/06/2013 Time: 1191-4782 SLP Time Calculation (min): 24 min  Past Medical History:  Past Medical History  Diagnosis Date  . Diabetes mellitus   . Hypertension   . Testicular cancer dx'd 08/2010    surg only  . CHF (congestive heart failure)   . CAD (coronary artery disease)   . Peripheral vascular disease   . Vitamin B12 deficiency   . Diabetic retinopathy   . Diabetic peripheral neuropathy   . Cholelithiasis   . Family history of colon cancer     brother   . 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 Below knee leg amputation  2005    Right  . Below knee leg amputation  2006    Left  . Appendectomy    . Eye surgery  2012    Laser  . Ptca    . Ines Bloomer hole Right 03/04/2013    Procedure: [name];  Surgeon: Temple Pacini, MD;  Location: [facility];  Service: Neurosurgery;  Laterality: Right;  [name]    HPI:  79 year old male with multiple medical problems including diabetes mellitus hypertension coronary artery disease, congestive heart failure, history of testicular cancer, history of liposarcoma, and possible recently diagnosed pneumonia presents with a one-month history of progressive failure to thrive at home. Over the past 36 hours the patient is exhibited significant left-sided weakness. Over the past 24 hours he has begun to complain of headaches. He said no seizures. He has suffered multiple falls but cannot recall any one specific particular fall that was noteworthy. Dx: Large right frontal chronic subdural hematoma with marked mass effect. Pt underwent Right frontal parietal burr hole with evacuation of chronic subdural hematoma. Placement of subdural drain. on 7/28. Pt also with Community acquired pneumonia- continue Rocephin and Azithro for CAP vs. Aspiration pneumonia.    Assessment / Plan / Recommendation Clinical Impression  Pt demonstrates subtle evidence concerning for possible aspiration (delayed swallow, wet vocal quality and throat clear with thin liquids) which, combined with question of aspiration pna warrrants conservative diet and objective testing. For now, pt may initiate a puree diet with nectar thick liquids. When denture adhesive arrives, pt likely to tolerate upgrade. SLP will f/u tomorrow for MBS given signs of decreased airway protection.     Aspiration Risk  Moderate    Diet Recommendation Dysphagia 1 (Puree);Nectar-thick liquid   Liquid Administration via: Cup Medication Administration: Whole meds with puree Supervision: Patient able to self feed;Full supervision/cueing for compensatory strategies Compensations: Slow rate;Small sips/bites Postural Changes and/or Swallow Maneuvers: Seated upright 90 degrees    Other  Recommendations Recommended Consults: MBS Oral Care Recommendations: Oral care BID   Follow Up Recommendations  None    Frequency and Duration        Pertinent Vitals/Pain NA    SLP Swallow Goals     Swallow Study Prior Functional Status       General HPI: 79 year old male with multiple medical problems including diabetes mellitus hypertension coronary artery disease, congestive heart failure, history of testicular cancer, history of liposarcoma, and possible recently diagnosed pneumonia presents with a one-month history of progressive failure to thrive at home. Over the past 36 hours the patient is exhibited significant left-sided weakness. Over the past 24 hours he has begun to complain of headaches. He said no seizures. He has suffered multiple  falls but cannot recall any one specific particular fall that was noteworthy. Dx: Large right frontal chronic subdural hematoma with marked mass effect. Pt underwent Right frontal parietal burr hole with evacuation of chronic subdural hematoma. Placement of subdural  drain. on 7/28. Pt also with Community acquired pneumonia- continue Rocephin and Azithro for CAP vs. Aspiration pneumonia. Type of Study: Bedside swallow evaluation Previous Swallow Assessment: none Diet Prior to this Study: NPO Temperature Spikes Noted: No Respiratory Status: Supplemental O2 delivered via (comment) History of Recent Intubation: Yes Length of Intubations (days): 1 days Date extubated: 03/03/13 Behavior/Cognition: Alert;Cooperative Oral Cavity - Dentition: Edentulous;Dentures, not available Self-Feeding Abilities: Able to feed self;Needs assist Patient Positioning: Upright in bed Baseline Vocal Quality: Clear Volitional Cough: Strong Volitional Swallow: Able to elicit    Oral/Motor/Sensory Function Overall Oral Motor/Sensory Function: Appears within functional limits for tasks assessed   Ice Chips     Thin Liquid Thin Liquid: Impaired Presentation: Cup;Straw;Self Fed Pharyngeal  Phase Impairments: Throat Clearing - Immediate;Wet Vocal Quality;Suspected delayed Swallow    Nectar Thick Nectar Thick Liquid: Impaired Presentation: Cup;Self Fed Pharyngeal Phase Impairments: Suspected delayed Swallow   Honey Thick Honey Thick Liquid: Not tested   Puree Puree: Impaired Presentation: Self Fed;Spoon Pharyngeal Phase Impairments: Suspected delayed Swallow   Solid   GO    Solid: Not tested      Harlon Ditty, MA CCC-SLP [phone]  Warden Buffa, Riley Nearing 03/06/2013,11:28 AM

## 2013-03-06 NOTE — Progress Notes (Signed)
Postop day 2. Patient looks much better this morning. No seizures or other problems overnight. Wide-awake. Does not complain of headache.  Afebrile. Heart rate and blood pressure acceptable. Drain output less but still functioning. Awake and alert. Oriented and reasonably appropriate. Left-sided weakness much improved. Dressing clean and dry.  Followup head CT scan demonstrates significant improvement in his chronic subdural hematoma. No evidence of new bleeding. No evidence of stroke or other problems.  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 Patient overall improving slowly. Continue subdural drain. Continue efforts at mobilization. Continue ICU observation for at least 1 more day.

## 2013-03-07 ENCOUNTER — Inpatient Hospital Stay (HOSPITAL_COMMUNITY): Payer: Medicare Other

## 2013-03-07 LAB — GLUCOSE, CAPILLARY
Glucose-Capillary: 144 mg/dL — ABNORMAL HIGH (ref 70–99)
Glucose-Capillary: 169 mg/dL — ABNORMAL HIGH (ref 70–99)

## 2013-03-07 LAB — BASIC METABOLIC PANEL
BUN: 17 mg/dL (ref 6–23)
CO2: 26 mEq/L (ref 19–32)
Calcium: 8.8 mg/dL (ref 8.4–10.5)
Chloride: 104 mEq/L (ref 96–112)
Creatinine, Ser: 0.94 mg/dL (ref 0.50–1.35)
Glucose, Bld: 179 mg/dL — ABNORMAL HIGH (ref 70–99)

## 2013-03-07 LAB — CBC
HCT: 38.3 % — ABNORMAL LOW (ref 39.0–52.0)
Hemoglobin: 13.2 g/dL (ref 13.0–17.0)
MCH: 32.3 pg (ref 26.0–34.0)
MCV: 93.6 fL (ref 78.0–100.0)
RBC: 4.09 MIL/uL — ABNORMAL LOW (ref 4.22–5.81)
WBC: 11.2 10*3/uL — ABNORMAL HIGH (ref 4.0–10.5)

## 2013-03-07 MED ORDER — INSULIN ASPART 100 UNIT/ML ~~LOC~~ SOLN
4.0000 [IU] | Freq: Three times a day (TID) | SUBCUTANEOUS | Status: DC
  Administered 2013-03-07: 12:00:00 via SUBCUTANEOUS
  Administered 2013-03-07 – 2013-03-20 (×32): 4 [IU] via SUBCUTANEOUS

## 2013-03-07 MED ORDER — INSULIN ASPART 100 UNIT/ML ~~LOC~~ SOLN
0.0000 [IU] | Freq: Three times a day (TID) | SUBCUTANEOUS | Status: DC
  Administered 2013-03-07: 3 [IU] via SUBCUTANEOUS
  Administered 2013-03-07 (×2): 2 [IU] via SUBCUTANEOUS
  Administered 2013-03-08: 5 [IU] via SUBCUTANEOUS
  Administered 2013-03-08: 13:00:00 via SUBCUTANEOUS
  Administered 2013-03-08 – 2013-03-09 (×3): 3 [IU] via SUBCUTANEOUS
  Administered 2013-03-09: 5 [IU] via SUBCUTANEOUS
  Administered 2013-03-10: 15 [IU] via SUBCUTANEOUS
  Administered 2013-03-11: 5 [IU] via SUBCUTANEOUS
  Administered 2013-03-11 (×2): 2 [IU] via SUBCUTANEOUS
  Administered 2013-03-12: 3 [IU] via SUBCUTANEOUS
  Administered 2013-03-12: 2 [IU] via SUBCUTANEOUS
  Administered 2013-03-12: 8 [IU] via SUBCUTANEOUS
  Administered 2013-03-13: 3 [IU] via SUBCUTANEOUS
  Administered 2013-03-13: 2 [IU] via SUBCUTANEOUS
  Administered 2013-03-14 (×2): 3 [IU] via SUBCUTANEOUS
  Administered 2013-03-15 (×3): 2 [IU] via SUBCUTANEOUS
  Administered 2013-03-16: 3 [IU] via SUBCUTANEOUS
  Administered 2013-03-16 – 2013-03-17 (×2): 2 [IU] via SUBCUTANEOUS
  Administered 2013-03-18 – 2013-03-19 (×3): 3 [IU] via SUBCUTANEOUS
  Administered 2013-03-19: 8 [IU] via SUBCUTANEOUS
  Administered 2013-03-19: 2 [IU] via SUBCUTANEOUS
  Administered 2013-03-20: 3 [IU] via SUBCUTANEOUS
  Administered 2013-03-20: 2 [IU] via SUBCUTANEOUS

## 2013-03-07 MED ORDER — PANTOPRAZOLE SODIUM 40 MG PO TBEC
40.0000 mg | DELAYED_RELEASE_TABLET | Freq: Every day | ORAL | Status: DC
  Administered 2013-03-07 – 2013-03-20 (×14): 40 mg via ORAL
  Filled 2013-03-07 (×11): qty 1

## 2013-03-07 MED ORDER — INSULIN ASPART 100 UNIT/ML ~~LOC~~ SOLN
0.0000 [IU] | Freq: Every day | SUBCUTANEOUS | Status: DC
  Administered 2013-03-08 – 2013-03-11 (×2): 2 [IU] via SUBCUTANEOUS

## 2013-03-07 NOTE — Progress Notes (Signed)
Overall stable. No new issues overnight. No seizure activity. Patient continues to deny headache.  He remains afebrile. Heart rate and blood pressure acceptable. Urine output good. Drain output 145 cc over the last 24 hours. On exam he is awake and alert. He is oriented and recently appropriate. Speech is fluent. He still has moderate left-sided weakness which is stable from yesterday.  Large right convexity chronic subdural hematoma status post bur hole evacuation of subdural drain placement. Continue subdural drain for today. Recheck CT scan of head in morning. Possibly remove drain tomorrow.

## 2013-03-07 NOTE — Progress Notes (Signed)
Physical Therapy Treatment Patient Details Name: [name] MRN: [id number] DOB: [birth year] Today's Date: 03/07/2013 Time: 1015-1040 PT Time Calculation (min): 25 min  PT Assessment / Plan / Recommendation  History of Present Illness [name] is a 79 year old African American male with a history of diabetes mellitus with vascular, renal, neurologic, and ophthalmologic complications, coronary artery disease, and peripheral vascular disease status post bilateral BKA who presented emergency department with increased left-sided weakness x 36 hours. The patient has experienced a significant decline in functional status over the past 3-4 weeks. His wife initially called stating that he was having increasing difficulty standing and that she could no longer care for him at home.  She requested placement in a nursing facility. She did not feel that he was able to come into the office due to his weakness. We arranged home health social work, Charity fundraiser, and physical therapy which began last week to evaluate him and to assist with placement options.  Over the past week he has had 2 falls.  EMS was called for the first of these and evaluated him.  He was not brought to the ER since there was no apparent injury. 5 days ago he experienced a second fall out of his wheelchair. His wife thinks he may have hit his head with both of these but did not appear to have any injury.  He was able to work with physical therapy last week but continued to have generalized weakness. His wife also noticed slight increase in confusion and urinary incontinence. [name], his son has been lifting him into his motorized wheelchair noticed that he was very weak on his left side. This was confirmed by the physical therapist today who called our office. We recommended that he come the emergency department for evaluation where he was found to have a large chronic subdural hematoma on head CT. Chest x-ray also showed possible pneumonia. Family does  report these been coughing recently with increased cough with eating as well. Neurosurgery has evaluated him and recommends emergent evacuation. They requested medical admission due to his medical complexity and possible pneumonia   PT Comments   Pt admitted with chronic SDH affecting balance and mobility. Pt currently with functional limitations due to poor motor quality and poor balance.  Pt lacks motor and postural control.   Pt will benefit from skilled PT to increase their independence and safety with mobility to allow discharge to the venue listed below.   Follow Up Recommendations  SNF                 Equipment Recommendations  Other (comment) (TBA)        Frequency Min 3X/week   Progress towards PT Goals Progress towards PT goals: Not progressing toward goals - comment (Pt resistant to movement)  Plan Current plan remains appropriate    Precautions / Restrictions Precautions Precautions: Fall Precaution Comments: Pt has had 2-3 recent falls with steady decline over last month Restrictions Weight Bearing Restrictions: No Other Position/Activity Restrictions: Pt is B BKA. Does not use prostetics to walk.   Pertinent Vitals/Pain VSS, no pain    Mobility  Bed Mobility Bed Mobility: Supine to Sit;Sitting - Scoot to Edge of Bed;Sit to Supine Supine to Sit: 1: +2 Total assist;HOB elevated Supine to Sit: Patient Percentage: 0% Sitting - Scoot to Edge of Bed: 1: +2 Total assist Sitting - Scoot to Edge of Bed: Patient Percentage: 10% Sit to Supine: 1: +2 Total assist Sit to Supine: Patient Percentage: 10% Details  for Bed Mobility Assistance: At EOB pt was resistant likely due to fear of being at EOB and could not get pt to asssit at EOB entire time with max facilitation.  Pt pushes back hard.  Transfers Transfers: Not assessed Ambulation/Gait Ambulation/Gait Assistance: Not tested (comment) Stairs: No Wheelchair Mobility Wheelchair Mobility: No    Exercises Other  Exercises Other Exercises: Stretching to left neck achieving midline at end of treatment.  Posted sign in room to remind pt to stretch throughout day.   Other Exercises: ROM LE's as well with pt very stiff and difficult to get him to follow commands.     PT Goals (current goals can now be found in the care plan section)    Visit Information  Last PT Received On: 03/07/13 Assistance Needed: +2 History of Present Illness: [name] is a 79 year old African American male with a history of diabetes mellitus with vascular, renal, neurologic, and ophthalmologic complications, coronary artery disease, and peripheral vascular disease status post bilateral BKA who presented emergency department with increased left-sided weakness x 36 hours. The patient has experienced a significant decline in functional status over the past 3-4 weeks. His wife initially called stating that he was having increasing difficulty standing and that she could no longer care for him at home.  She requested placement in a nursing facility. She did not feel that he was able to come into the office due to his weakness. We arranged home health social work, Charity fundraiser, and physical therapy which began last week to evaluate him and to assist with placement options.  Over the past week he has had 2 falls.  EMS was called for the first of these and evaluated him.  He was not brought to the ER since there was no apparent injury. 5 days ago he experienced a second fall out of his wheelchair. His wife thinks he may have hit his head with both of these but did not appear to have any injury.  He was able to work with physical therapy last week but continued to have generalized weakness. His wife also noticed slight increase in confusion and urinary incontinence. [name], his son has been lifting him into his motorized wheelchair noticed that he was very weak on his left side. This was confirmed by the physical therapist today who called our office. We  recommended that he come the emergency department for evaluation where he was found to have a large chronic subdural hematoma on head CT. Chest x-ray also showed possible pneumonia. Family does report these been coughing recently with increased cough with eating as well. Neurosurgery has evaluated him and recommends emergent evacuation. They requested medical admission due to his medical complexity and possible pneumonia    Subjective Data  Subjective: "I just got to my chair."   Cognition  Cognition Arousal/Alertness: Lethargic Behavior During Therapy: Flat affect (lethargic) Overall Cognitive Status: Impaired/Different from baseline Area of Impairment: Attention;Orientation;Following commands;Problem solving Memory: Decreased short-term memory Following Commands: Follows one step commands with increased time;Follows one step commands inconsistently Problem Solving: Slow processing    Balance  Static Sitting Balance Static Sitting - Balance Support: Right upper extremity supported;Left upper extremity supported;Bilateral upper extremity supported;Feet unsupported Static Sitting - Level of Assistance: 2: Max assist;1: +1 Total assist Static Sitting - Comment/# of Minutes: EOB 6 min working on trunk activation and attention without success.    End of Session PT - End of Session Activity Tolerance: Patient limited by fatigue (limited by poor postural stability overall.) Patient  left: in bed;with call bell/phone within reach;with bed alarm set;with family/visitor present Nurse Communication: Mobility status        INGOLD,Varian Innes 03/07/2013, 1:53 PM [facility] [phone] (pager)

## 2013-03-07 NOTE — Procedures (Signed)
Objective Swallowing Evaluation: Modified Barium Swallowing Study  Patient Details  Name: [name] MRN: [id number] Date of Birth: 04/04/1933  Today's Date: 03/07/2013 Time: 1100-1134 SLP Time Calculation (min): 34 min  Past Medical History:  Past Medical History  Diagnosis Date  . Diabetes mellitus   . Hypertension   . Testicular cancer dx'd 08/2010    surg only  . CHF (congestive heart failure)   . CAD (coronary artery disease)   . Peripheral vascular disease   . Vitamin B12 deficiency   . Diabetic retinopathy   . Diabetic peripheral neuropathy   . Cholelithiasis   . Family history of colon cancer     brother   . 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 Below knee leg amputation  2005    Right  . Below knee leg amputation  2006    Left  . Appendectomy    . Eye surgery  2012    Laser  . Ptca    . Ines Bloomer hole Right 03/04/2013    Procedure: [name];  Surgeon: Temple Pacini, MD;  Location: [facility];  Service: Neurosurgery;  Laterality: Right;  [name]    HPI:  79 year old male with multiple medical problems including diabetes mellitus hypertension coronary artery disease, congestive heart failure, history of testicular cancer, history of liposarcoma, and possible recently diagnosed pneumonia presents with a one-month history of progressive failure to thrive at home. Over the past 36 hours the patient is exhibited significant left-sided weakness. Over the past 24 hours he has begun to complain of headaches. He said no seizures. He has suffered multiple falls but cannot recall any one specific particular fall that was noteworthy. Dx: Large right frontal chronic subdural hematoma with marked mass effect. Pt underwent Right frontal parietal burr hole with evacuation of chronic subdural hematoma. Placement of subdural drain. on 7/28. Pt also with Community acquired pneumonia- continue Rocephin and Azithro for CAP  vs. Aspiration pneumonia.     Assessment / Plan / Recommendation Clinical Impression  Dysphagia Diagnosis: Moderate pharyngeal phase dysphagia Clinical impression: Pt demonstrates a mild to moderate oropharyngeal dysphagia characterized by mildly weak base of tongue retraction with mild vallecular residuals. Most significantly, pt demonstrates sensory deficits leading to delayed swallow response with silent frank penetration before the swallow. Pt positioning in the study was not optimal, pt with forward leaning position with poor trunk/head support which may have contributed to deficits. With therapeutic intervention for a chin tuck pt was able to protect airway prior to the swallow. This was most efficiently completed by cueing pt to tuck chin, then sip from the staw with chin down. Thickened liquids did not prevent aspiration. Recommend pt initiate a regular texture diet with thin liquids with full supervision for a chin tuck. Pt will need ongoing therapy to habitualize the chin tuck. If plan if for d/c hom, rec HH SLP.     Treatment Recommendation  Therapy as outlined in treatment plan below    Diet Recommendation Regular;Thin liquid   Liquid Administration via: Straw;Cup Medication Administration: Whole meds with puree Supervision: Patient able to self feed;Full supervision/cueing for compensatory strategies Compensations: Small sips/bites;Slow rate Postural Changes and/or Swallow Maneuvers: Chin tuck;Upright 30-60 min after meal;Seated upright 90 degrees    Other  Recommendations Oral Care Recommendations: Oral care BID   Follow Up Recommendations  Home health SLP    Frequency and Duration min 2x/week  2 weeks   Pertinent  Vitals/Pain NA    SLP Swallow Goals Patient will utilize recommended strategies during swallow to increase swallowing safety with: Minimal cueing;Minimal assistance Swallow Study Goal #2 - Progress: Progressing toward goal   General HPI: 79 year old male with  multiple medical problems including diabetes mellitus hypertension coronary artery disease, congestive heart failure, history of testicular cancer, history of liposarcoma, and possible recently diagnosed pneumonia presents with a one-month history of progressive failure to thrive at home. Over the past 36 hours the patient is exhibited significant left-sided weakness. Over the past 24 hours he has begun to complain of headaches. He said no seizures. He has suffered multiple falls but cannot recall any one specific particular fall that was noteworthy. Dx: Large right frontal chronic subdural hematoma with marked mass effect. Pt underwent Right frontal parietal burr hole with evacuation of chronic subdural hematoma. Placement of subdural drain. on 7/28. Pt also with Community acquired pneumonia- continue Rocephin and Azithro for CAP vs. Aspiration pneumonia. Type of Study: Modified Barium Swallowing Study Reason for Referral: Objectively evaluate swallowing function Diet Prior to this Study: Dysphagia 1 (puree);Nectar-thick liquids Temperature Spikes Noted: No Respiratory Status: Room air History of Recent Intubation: Yes Length of Intubations (days): 1 days Date extubated: 03/03/13 Behavior/Cognition: Alert;Cooperative Oral Cavity - Dentition: Dentures, top;Dentures, bottom Oral Motor / Sensory Function: Within functional limits Self-Feeding Abilities: Able to feed self;Needs assist Patient Positioning: Postural control interferes with function Baseline Vocal Quality: Clear Volitional Cough: Strong Volitional Swallow: Able to elicit Anatomy: Within functional limits Pharyngeal Secretions: Not observed secondary MBS    Reason for Referral Objectively evaluate swallowing function   Oral Phase Oral Preparation/Oral Phase Oral Phase: WFL   Pharyngeal Phase Pharyngeal Phase Pharyngeal Phase: Impaired Pharyngeal - Nectar Pharyngeal - Nectar Cup: Delayed swallow initiation;Penetration/Aspiration  before swallow;Pharyngeal residue - valleculae;Reduced tongue base retraction Penetration/Aspiration details (nectar cup): Material enters airway, CONTACTS cords and not ejected out;Material enters airway, CONTACTS cords then ejected out;Material enters airway, remains ABOVE vocal cords and not ejected out Pharyngeal - Thin Pharyngeal - Thin Cup: Delayed swallow initiation;Penetration/Aspiration before swallow;Reduced tongue base retraction;Pharyngeal residue - valleculae Penetration/Aspiration details (thin cup): Material enters airway, remains ABOVE vocal cords and not ejected out;Material enters airway, CONTACTS cords then ejected out;Material enters airway, CONTACTS cords and not ejected out Pharyngeal - Solids Pharyngeal - Puree: Pharyngeal residue - valleculae;Reduced tongue base retraction Pharyngeal - Regular: Reduced tongue base retraction;Pharyngeal residue - valleculae Pharyngeal - Pill: Reduced tongue base retraction;Pharyngeal residue - valleculae  Cervical Esophageal Phase    GO             Harlon Ditty, MA CCC-SLP [phone]  [name] 03/07/2013, 12:07 PM

## 2013-03-07 NOTE — Progress Notes (Signed)
Subjective: No complaints.  Denies headache.  Weakness improved  Objective: Vital signs in last 24 hours: Temp:  [98 F (36.7 C)-98.2 F (36.8 C)] 98.2 F (36.8 C) (07/30 2000) Pulse Rate:  [67-79] 68 (07/31 0600) Resp:  [17-25] 20 (07/31 0600) BP: (93-181)/(43-96) 116/64 mmHg (07/31 0600) SpO2:  [77 %-100 %] 96 % (07/31 0600) Weight change:     CBG (last 3)   Recent Labs  03/06/13 2012 03/07/13 0009 03/07/13 0416  GLUCAP 178* 144* 151*    Intake/Output from previous day: 07/30 0701 - 07/31 0700 In: 1028 [P.O.:800; I.V.:228] Out: 685 [Urine:540; Drains:145] Intake/Output this shift:    General appearance: alert and slightly disoriented but more animated Eyes: no scleral icterus Throat: oropharynx moist without erythema Resp: clear to auscultation bilaterally Cardio: regular rate and rhythm GI: soft, non-tender; bowel sounds normal; no masses,  no organomegaly Extremities: no clubbing, cyanosis or edema; s/p bilateral BKA   Lab Results:  Recent Labs  03/06/13 0535 03/07/13 0410  NA 137 136  K 4.4 4.7  CL 104 104  CO2 22 26  GLUCOSE 114* 179*  BUN 22 17  CREATININE 0.95 0.94  CALCIUM 8.4 8.8     Recent Labs  03/04/13 1434  03/06/13 0535 03/07/13 0410  WBC 15.5*  < > 10.2 11.2*  NEUTROABS 12.0*  --   --   --   HGB 13.5  < > 13.0 13.2  HCT 38.3*  < > 38.4* 38.3*  MCV 93.4  < > 95.5 93.6  PLT 293  < > 236 308  < > = values in this interval not displayed. Lab Results  Component Value Date   INR 1.16 03/04/2013   INR 1.09 11/14/2010   INR 1.0 12/05/2007    Studies/Results: Ct Head Wo Contrast  03/06/2013   *RADIOLOGY REPORT*  Clinical Data: follow up subdural hematoma  CT HEAD WITHOUT CONTRAST  Technique:  Contiguous axial images were obtained from the base of the skull through the vertex without contrast.  Comparison: 03/04/13  Findings: Since the prior study, two adjacent percutaneous drainage catheters have been placed through the anterior right  parietal lobe, and these extend inferiorly and anteriorly into the right frontal region.  There is a small volume of pneumocephalus as a result.  There is again a large right subdural collection, measuring maximal diameter of 34 mm, decreased from prior maximal diameter of 39 mm.  Near the vertex, the contents of the extra- axial collection are primarily low attenuation as they were previously, but there is now also high attenuation along the more posterior and inferior aspect of the collection.  New from the prior study, there is mild to moderate subarachnoid hemorrhage bilaterally, seen in the left occipital lobe and also in the right posterior parietal and occipital lobe.  A few punctate foci of hyperattenuation are seen in the brain stem which may represent acute hemorrhage as well.  These could also represent tiny calcifications.  Midline shift has decreased from 17 to 14 mm with slightly decreased mass effect on the right lateral ventricle.  No intraventricular hemorrhage or hydrocephalus is appreciated.  IMPRESSION: Status post insertion of two right sided drainage catheters, there is a decrease in the size of a large right subdural hematoma. Hyperattenuating subdural blood is consistent with active hemorrhage.  There is also now subarachnoid hemorrhage bilaterally. There is mild decrease in mass effect and midline shift.   Original Report Authenticated By: Esperanza Heir, M.D.     Medications: Scheduled: .  amoxicillin-clavulanate  1 tablet Oral Q12H  . insulin aspart  0-15 Units Subcutaneous Q4H  . insulin glargine  8 Units Subcutaneous QAC breakfast  . levETIRAcetam  500 mg Oral BID  . pantoprazole (PROTONIX) IV  40 mg Intravenous QHS  . sodium chloride  3 mL Intravenous Q12H   Continuous:   Assessment/Plan: Principal Problem:  1.Chronic subdural hematoma- improving mental status and left sided weakness. Post-op management per Neurosurgery.  Active Problems:  2. Community acquired  pneumonia- antibiotics changed to augmentin per NSU due to loss of IV access.  Afebrile. WBC improved. Speech Therapy evaluation noted. 3. DIABETES MELLITUS- continue Lantus to 8 units daily.  Change SSI to qAC 4. HYPERTENSION- BP stable off medications 5. CAD- resume medical therapy when stable.  6. CHF- appears euvolemic. IV fluids discontinued per NSU. 7. PVD s/p BKA- ASA, Plavix on hold due to #1.  8. Disposition- anticipate discharge to SNF when stable per NSU. Continue PT/OT/ST.  Defer timing of transfer from NSICU to NSU.    LOS: 3 days   Osualdo Hansell,W DOUGLAS 03/07/2013, 7:06 AM

## 2013-03-08 ENCOUNTER — Inpatient Hospital Stay (HOSPITAL_COMMUNITY): Payer: Medicare Other

## 2013-03-08 DIAGNOSIS — M869 Osteomyelitis, unspecified: Secondary | ICD-10-CM

## 2013-03-08 DIAGNOSIS — L89309 Pressure ulcer of unspecified buttock, unspecified stage: Secondary | ICD-10-CM

## 2013-03-08 HISTORY — DX: Osteomyelitis, unspecified: M86.9

## 2013-03-08 HISTORY — DX: Pressure ulcer of unspecified buttock, unspecified stage: L89.309

## 2013-03-08 LAB — CBC
Hemoglobin: 13.7 g/dL (ref 13.0–17.0)
MCHC: 35.7 g/dL (ref 30.0–36.0)
RDW: 13.8 % (ref 11.5–15.5)
WBC: 9.2 10*3/uL (ref 4.0–10.5)

## 2013-03-08 LAB — BASIC METABOLIC PANEL
Chloride: 103 mEq/L (ref 96–112)
GFR calc Af Amer: >90 mL/min (ref >90)
GFR calc non Af Amer: 84 mL/min — ABNORMAL LOW (ref >90)
Potassium: 4.2 mEq/L (ref 3.5–5.1)
Sodium: 136 mEq/L (ref 135–145)

## 2013-03-08 IMAGING — CT CT HEAD W/O CM
1 series · 12 of 30 positions shown, 15 images · non-contrast
Comparison: 03/06/2013.

CLINICAL DATA: Subdural hematoma.

CT HEAD WITHOUT CONTRAST
TECHNIQUE: Contiguous axial images were obtained from the base of
the skull through the vertex without contrast.

[Series 502: batch 2 · axial · 0.46mm/px · z∈[+1171,+1324]mm · 12 of 39 slices shown, 15 images]
[im 3/39  brain]
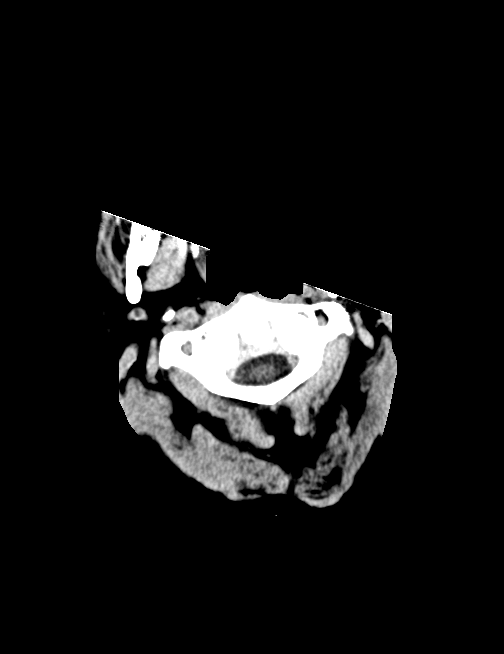
[im 3/39  bone]
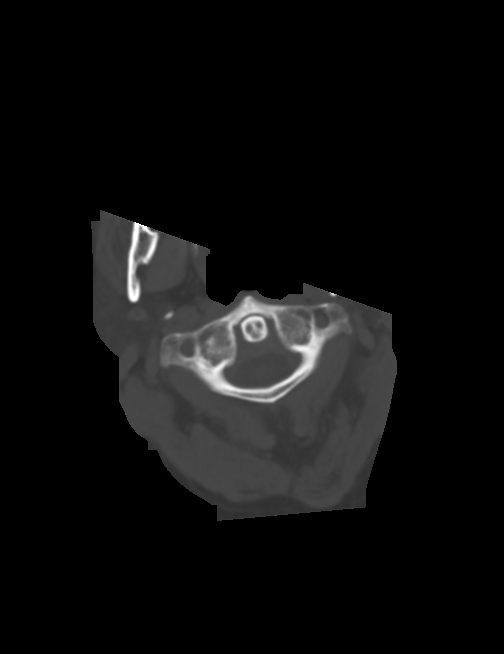
[im 6/39  brain]
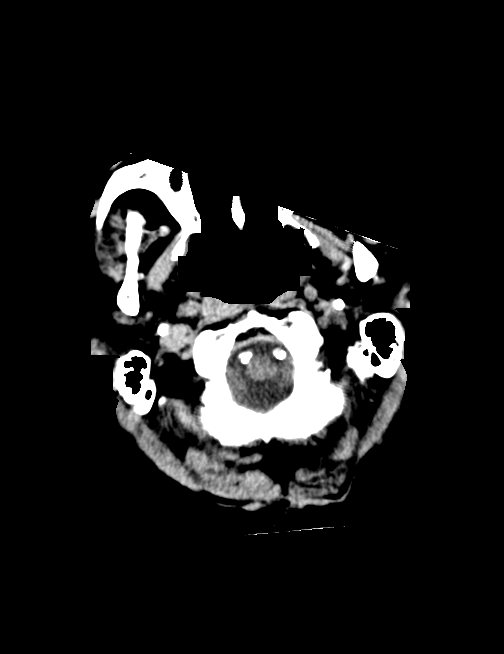
[im 8/39  brain]
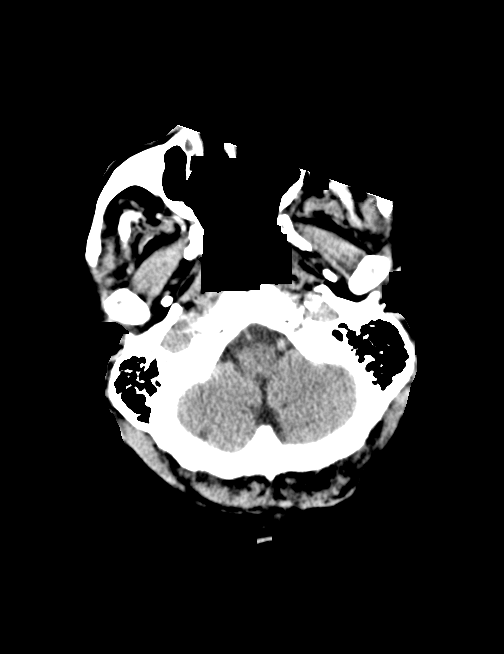
[im 12/39  brain]
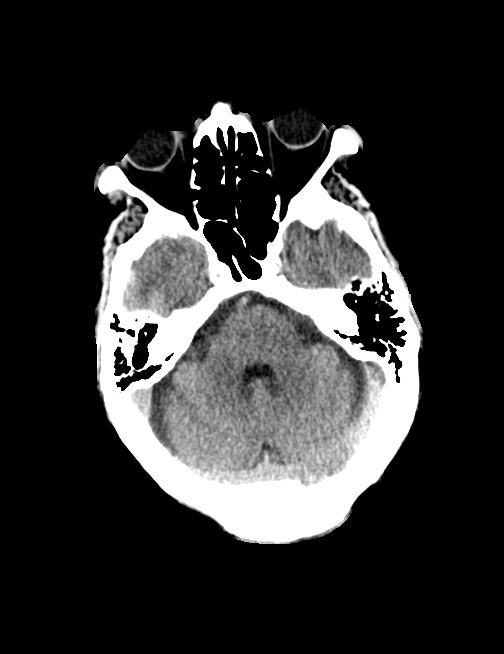
[im 15/39  brain]
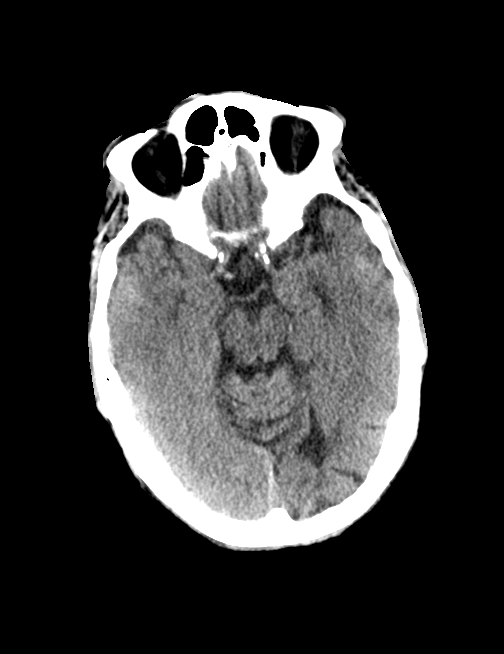
[im 15/39  bone]
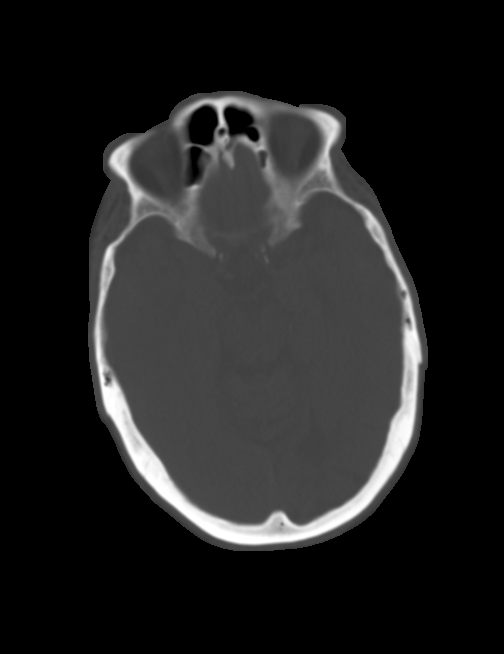
[im 18/39  brain]
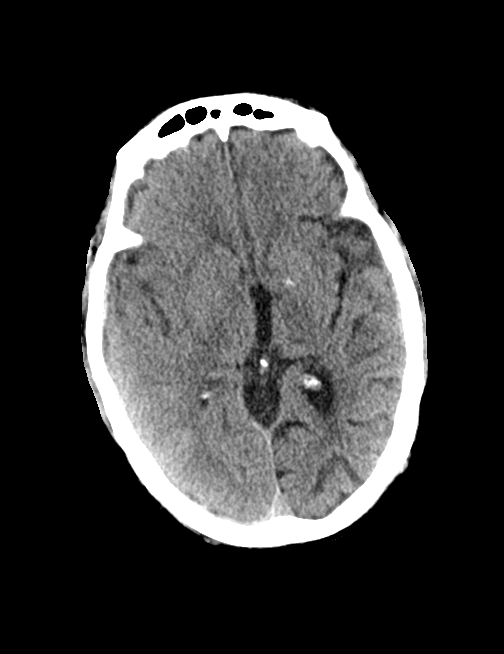
[im 21/39  brain]
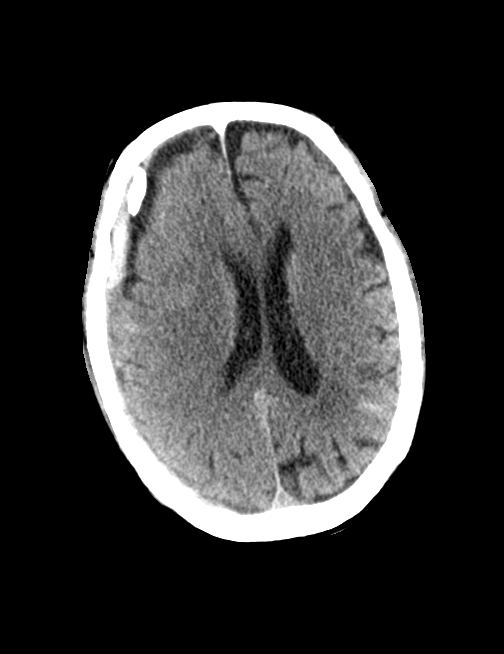
[im 24/39  brain]
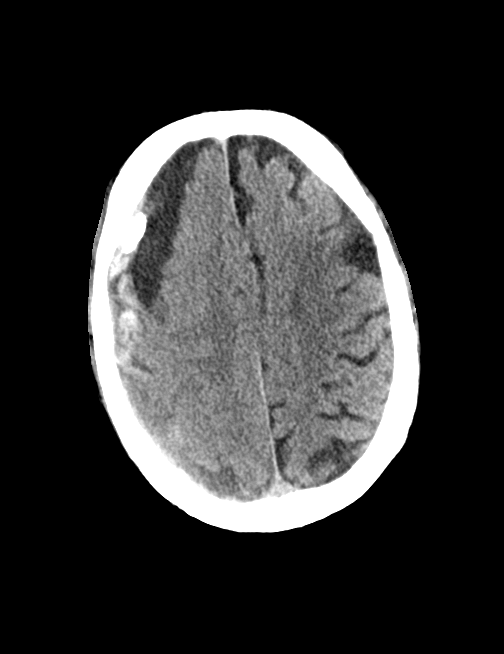
[im 27/39  brain]
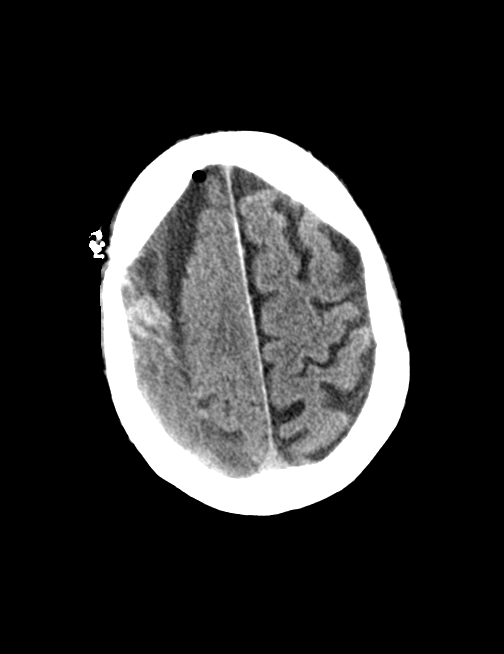
[im 27/39  bone]
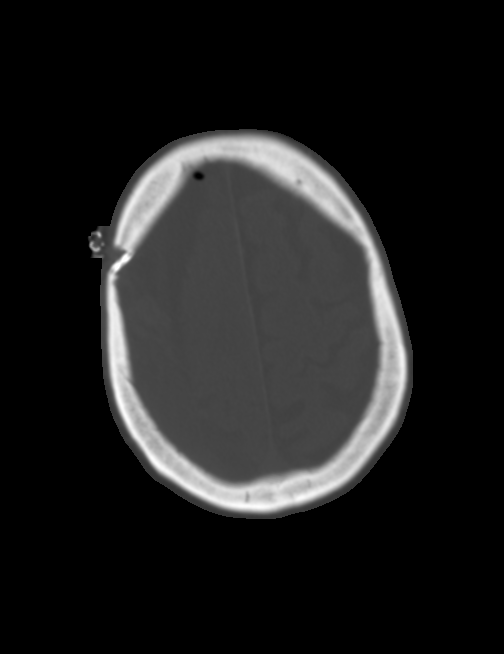
[im 31/39  brain]
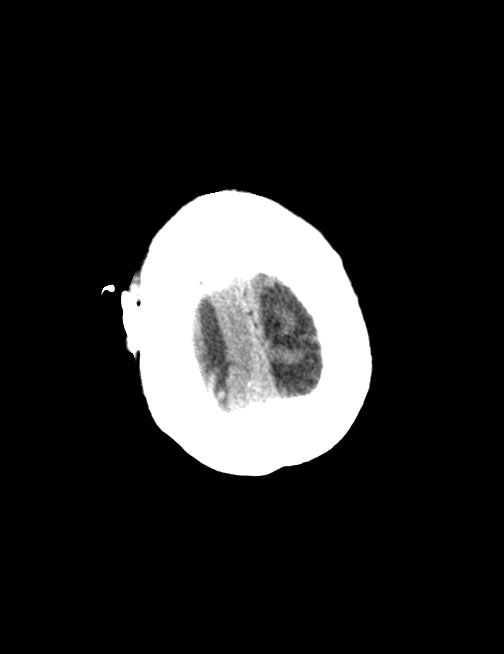
[im 33/39  brain]
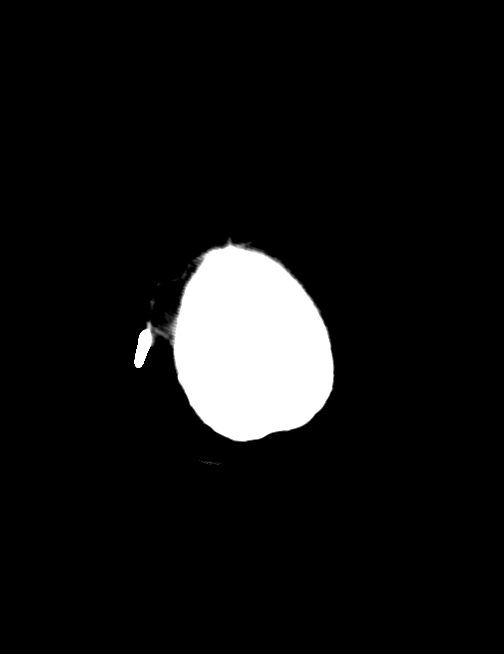
[im 36/39  brain]
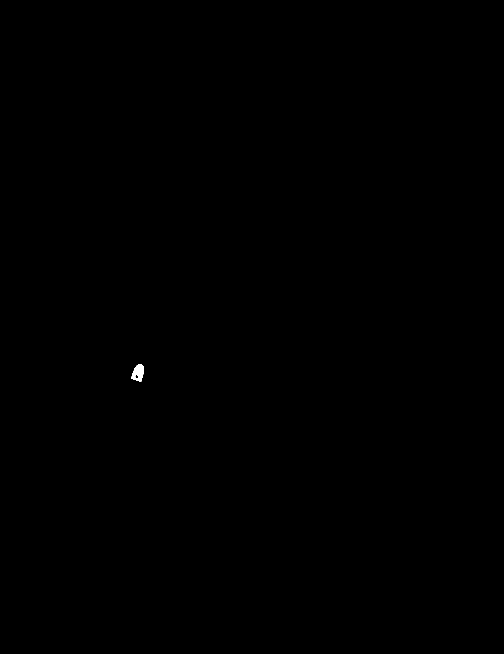

[12 of 30 positions shown; findings below may reference images not displayed]

FINDINGS: Subdural drain via right frontal burr hole.  The drain is
directed anteriorly -unchanged from prior.

The data was manipulated on reformatting software to make imaging
axis similar to prior. Unfortunately these images could not be sent
to PACS.

A right convexity mixed density subdural hematoma is size stable,
with the largest component measuring nearly 3.5 cm.  Anteriorly,
there is a discrete hypodense collection, separated from a
heterogeneously high density component by a septation.  The drain
appears to be preferentially within the high density component.
Right-to-left midline shift is similar to prior, 10 mm at the
anterior septum pellucidum.

Scattered subarachnoid hemorrhage is similar to decreased.  No
hydrocephalous.

No evidence of acute ischemia.  Remote inferior left occipital lobe
infarction.  Extensive intracranial atherosclerosis.
IMPRESSION: 1. When accounting for different scan angle, unchanged volume of
septated subdural hematoma along the right cerebral convexity.

2. No increase in small volume bilateral subarachnoid hemorrhage.
No hydrocephalus.

## 2013-03-08 NOTE — Progress Notes (Signed)
No new issues or problems. Patient denies headache. No seizures.  Afebrile. Vital stable. Drain output left. Awake and alert. Oriented and appropriate. Still with mild left-sided weakness but improved from preop. Wound clean and dry.  Followup head CT scan demonstrates improved appearance of the subdural fluid collection.  I think it saved pull the subdural drain today. Patient will be observed in the ICU overnight and should be able to be transferred to the floor tomorrow.

## 2013-03-08 NOTE — Progress Notes (Signed)
Subjective: No complaints.  Denies headache, confusion.    Objective: Vital signs in last 24 hours: Temp:  [97.1 F (36.2 C)-98.4 F (36.9 C)] 97.5 F (36.4 C) (08/01 0400) Pulse Rate:  [68-79] 68 (08/01 0600) Resp:  [16-25] 16 (08/01 0600) BP: (98-154)/(32-97) 109/45 mmHg (08/01 0600) SpO2:  [93 %-99 %] 94 % (08/01 0600) Weight:  [103.5 kg (228 lb 2.8 oz)] 103.5 kg (228 lb 2.8 oz) (08/01 0450) Weight change:     CBG (last 3)   Recent Labs  03/07/13 1205 03/07/13 1604 03/07/13 2219  GLUCAP 149* 142* 169*    Intake/Output from previous day: 07/31 0701 - 08/01 0700 In: 480 [P.O.:480] Out: 70 [Drains:70] Intake/Output this shift:    General appearance: alert and no distress Eyes: no scleral icterus Throat: oropharynx moist without erythema Resp: clear to auscultation bilaterally Cardio: regular rate and rhythm GI: soft, non-tender; bowel sounds normal; no masses,  no organomegaly Extremities: no clubbing, cyanosis or edema Neuro: strength 5/5 in all extremities, oriented to place, time.   Lab Results:  Recent Labs  03/07/13 0410 03/08/13 0449  NA 136 136  K 4.7 4.2  CL 104 103  CO2 26 24  GLUCOSE 179* 189*  BUN 17 11  CREATININE 0.94 0.80  CALCIUM 8.8 8.7     Recent Labs  03/07/13 0410 03/08/13 0449  WBC 11.2* 9.2  HGB 13.2 13.7  HCT 38.3* 38.4*  MCV 93.6 92.8  PLT 308 294   Lab Results  Component Value Date   INR 1.16 03/04/2013   INR 1.09 11/14/2010   INR 1.0 12/05/2007    Studies/Results: Ct Head Wo Contrast  03/08/2013   *RADIOLOGY REPORT*  Clinical Data: Subdural hematoma.  CT HEAD WITHOUT CONTRAST  Technique:  Contiguous axial images were obtained from the base of the skull through the vertex without contrast.  Comparison: 03/06/2013.  Findings: Subdural drain via right frontal burr hole.  The drain is directed anteriorly -unchanged from prior.  The data was manipulated on reformatting software to make imaging axis similar to prior.  Unfortunately these images could not be sent to PACS.  A right convexity mixed density subdural hematoma is size stable, with the largest component measuring nearly 3.5 cm.  Anteriorly, there is a discrete hypodense collection, separated from a heterogeneously high density component by a septation.  The drain appears to be preferentially within the high density component. Right-to-left midline shift is similar to prior, 10 mm at the anterior septum pellucidum.  Scattered subarachnoid hemorrhage is similar to decreased.  No hydrocephalous.  No evidence of acute ischemia.  Remote inferior left occipital lobe infarction.  Extensive intracranial atherosclerosis.  IMPRESSION:  1. When accounting for different scan angle, unchanged volume of septated subdural hematoma along the right cerebral convexity.  2. No increase in small volume bilateral subarachnoid hemorrhage. No hydrocephalus.   Original Report Authenticated By: Tiburcio Pea   Ct Head Wo Contrast  03/06/2013   *RADIOLOGY REPORT*  Clinical Data: follow up subdural hematoma  CT HEAD WITHOUT CONTRAST  Technique:  Contiguous axial images were obtained from the base of the skull through the vertex without contrast.  Comparison: 03/04/13  Findings: Since the prior study, two adjacent percutaneous drainage catheters have been placed through the anterior right parietal lobe, and these extend inferiorly and anteriorly into the right frontal region.  There is a small volume of pneumocephalus as a result.  There is again a large right subdural collection, measuring maximal diameter of 34 mm, decreased  from prior maximal diameter of 39 mm.  Near the vertex, the contents of the extra- axial collection are primarily low attenuation as they were previously, but there is now also high attenuation along the more posterior and inferior aspect of the collection.  New from the prior study, there is mild to moderate subarachnoid hemorrhage bilaterally, seen in the left occipital  lobe and also in the right posterior parietal and occipital lobe.  A few punctate foci of hyperattenuation are seen in the brain stem which may represent acute hemorrhage as well.  These could also represent tiny calcifications.  Midline shift has decreased from 17 to 14 mm with slightly decreased mass effect on the right lateral ventricle.  No intraventricular hemorrhage or hydrocephalus is appreciated.  IMPRESSION: Status post insertion of two right sided drainage catheters, there is a decrease in the size of a large right subdural hematoma. Hyperattenuating subdural blood is consistent with active hemorrhage.  There is also now subarachnoid hemorrhage bilaterally. There is mild decrease in mass effect and midline shift.   Original Report Authenticated By: Esperanza Heir, M.D.   Dg Swallowing Func-speech Pathology  03/07/2013   Riley Nearing Deblois, CCC-SLP     03/07/2013 12:09 PM Objective Swallowing Evaluation: Modified Barium Swallowing Study   Patient Details  Name: [name] MRN: [id number] Date of Birth: Dec 10, 1932  Today's Date: 03/07/2013 Time: 1100-1134 SLP Time Calculation (min): 34 min  Past Medical History:  Past Medical History  Diagnosis Date  . Diabetes mellitus   . Hypertension   . Testicular cancer dx'd 08/2010    surg only  . CHF (congestive heart failure)   . CAD (coronary artery disease)   . Peripheral vascular disease   . Vitamin B12 deficiency   . Diabetic retinopathy   . Diabetic peripheral neuropathy   . Cholelithiasis   . Family history of colon cancer     brother   . 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 Below knee leg amputation  2005    Right  . Below knee leg amputation  2006    Left  . Appendectomy    . Eye surgery  2012    Laser  . Ptca    . Ines Bloomer hole Right 03/04/2013    Procedure: [name];  Surgeon: Temple Pacini, MD;  Location:  [facility];  Service: Neurosurgery;  Laterality: Right;  [name]    HPI:   79 year old male with multiple medical problems including  diabetes mellitus hypertension coronary artery disease,  congestive heart failure, history of testicular cancer, history  of liposarcoma, and possible recently diagnosed pneumonia  presents with a one-month history of progressive failure to  thrive at home. Over the past 36 hours the patient is exhibited  significant left-sided weakness. Over the past 24 hours he has  begun to complain of headaches. He said no seizures. He has  suffered multiple falls but cannot recall any one specific  particular fall that was noteworthy. Dx: Large right frontal  chronic subdural hematoma with marked mass effect. Pt underwent  Right frontal parietal burr hole with evacuation of chronic  subdural hematoma. Placement of subdural drain. on 7/28. Pt also  with Community acquired pneumonia- continue Rocephin and Azithro  for CAP vs. Aspiration pneumonia.     Assessment / Plan / Recommendation Clinical Impression  Dysphagia Diagnosis: Moderate pharyngeal phase dysphagia Clinical impression: Pt demonstrates a mild to moderate  oropharyngeal dysphagia  characterized by mildly weak base of  tongue retraction with mild vallecular residuals. Most  significantly, pt demonstrates sensory deficits leading to  delayed swallow response with silent frank penetration before the  swallow. Pt positioning in the study was not optimal, pt with  forward leaning position with poor trunk/head support which may  have contributed to deficits. With therapeutic intervention for a  chin tuck pt was able to protect airway prior to the swallow.  This was most efficiently completed by cueing pt to tuck chin,  then sip from the staw with chin down. Thickened liquids did not  prevent aspiration. Recommend pt initiate a regular texture diet  with thin liquids with full supervision for a chin tuck. Pt will  need ongoing therapy to habitualize the chin tuck. If plan if for  d/c hom, rec HH SLP.     Treatment  Recommendation  Therapy as outlined in treatment plan below    Diet Recommendation Regular;Thin liquid   Liquid Administration via: Straw;Cup Medication Administration: Whole meds with puree Supervision: Patient able to self feed;Full supervision/cueing  for compensatory strategies Compensations: Small sips/bites;Slow rate Postural Changes and/or Swallow Maneuvers: Chin tuck;Upright  30-60 min after meal;Seated upright 90 degrees    Other  Recommendations Oral Care Recommendations: Oral care BID   Follow Up Recommendations  Home health SLP    Frequency and Duration min 2x/week  2 weeks   Pertinent Vitals/Pain NA    SLP Swallow Goals Patient will utilize recommended strategies during swallow to  increase swallowing safety with: Minimal cueing;Minimal  assistance Swallow Study Goal #2 - Progress: Progressing toward goal   General HPI: 79 year old male with multiple medical problems  including diabetes mellitus hypertension coronary artery disease,  congestive heart failure, history of testicular cancer, history  of liposarcoma, and possible recently diagnosed pneumonia  presents with a one-month history of progressive failure to  thrive at home. Over the past 36 hours the patient is exhibited  significant left-sided weakness. Over the past 24 hours he has  begun to complain of headaches. He said no seizures. He has  suffered multiple falls but cannot recall any one specific  particular fall that was noteworthy. Dx: Large right frontal  chronic subdural hematoma with marked mass effect. Pt underwent  Right frontal parietal burr hole with evacuation of chronic  subdural hematoma. Placement of subdural drain. on 7/28. Pt also  with Community acquired pneumonia- continue Rocephin and Azithro  for CAP vs. Aspiration pneumonia. Type of Study: Modified Barium Swallowing Study Reason for Referral: Objectively evaluate swallowing function Diet Prior to this Study: Dysphagia 1 (puree);Nectar-thick  liquids Temperature Spikes  Noted: No Respiratory Status: Room air History of Recent Intubation: Yes Length of Intubations (days): 1 days Date extubated: 03/03/13 Behavior/Cognition: Alert;Cooperative Oral Cavity - Dentition: Dentures, top;Dentures, bottom Oral Motor / Sensory Function: Within functional limits Self-Feeding Abilities: Able to feed self;Needs assist Patient Positioning: Postural control interferes with function Baseline Vocal Quality: Clear Volitional Cough: Strong Volitional Swallow: Able to elicit Anatomy: Within functional limits Pharyngeal Secretions: Not observed secondary MBS    Reason for Referral Objectively evaluate swallowing function   Oral Phase Oral Preparation/Oral Phase Oral Phase: WFL   Pharyngeal Phase Pharyngeal Phase Pharyngeal Phase: Impaired Pharyngeal - Nectar Pharyngeal - Nectar Cup: Delayed swallow  initiation;Penetration/Aspiration before swallow;Pharyngeal  residue - valleculae;Reduced tongue base retraction Penetration/Aspiration details (nectar cup): Material enters  airway, CONTACTS cords and not ejected out;Material enters  airway, CONTACTS cords then ejected out;Material enters airway,  remains ABOVE  vocal cords and not ejected out Pharyngeal - Thin Pharyngeal - Thin Cup: Delayed swallow  initiation;Penetration/Aspiration before swallow;Reduced tongue  base retraction;Pharyngeal residue - valleculae Penetration/Aspiration details (thin cup): Material enters  airway, remains ABOVE vocal cords and not ejected out;Material  enters airway, CONTACTS cords then ejected out;Material enters  airway, CONTACTS cords and not ejected out Pharyngeal - Solids Pharyngeal - Puree: Pharyngeal residue - valleculae;Reduced  tongue base retraction Pharyngeal - Regular: Reduced tongue base retraction;Pharyngeal  residue - valleculae Pharyngeal - Pill: Reduced tongue base retraction;Pharyngeal  residue - valleculae  Cervical Esophageal Phase    GO             Harlon Ditty, MA CCC-SLP [phone]  DeBlois, Riley Nearing 03/07/2013, 12:07 PM      Medications: Scheduled: . amoxicillin-clavulanate  1 tablet Oral Q12H  . insulin aspart  0-15 Units Subcutaneous TID WC  . insulin aspart  0-5 Units Subcutaneous QHS  . insulin aspart  4 Units Subcutaneous TID WC  . insulin glargine  8 Units Subcutaneous QAC breakfast  . levETIRAcetam  500 mg Oral BID  . pantoprazole  40 mg Oral Q1200  . sodium chloride  3 mL Intravenous Q12H   Continuous:   Assessment/Plan: Principal Problem:  1.Chronic subdural hematoma- improving mental status. Left sided weakness has resolved. Post-op management per Neurosurgery.  Active Problems:  2. Community acquired pneumonia-continue Augmentin to complete 7 day course of Atbs on 8/4.  Add Incentive Spirometry. 3. DIABETES MELLITUS- continue Lantus and SSI qAC- adequate control. 4. HYPERTENSION- BP stable off medications  5. CAD- resume medical therapy when BP will tolerate.  6. CHF- appears euvolemic.  7. PVD s/p BKA- ASA, Plavix on hold due to #1.  8. Disposition- anticipate discharge to SNF when stable per NSU. Continue PT/OT/ST.   Appreciate Neurosurgery assuming primary management of this patient.  We will continue to follow to assist with medical issues.     LOS: 4 days   Arnesia Vincelette,W DOUGLAS 03/08/2013, 7:12 AM

## 2013-03-09 ENCOUNTER — Inpatient Hospital Stay (HOSPITAL_COMMUNITY): Payer: Medicare Other

## 2013-03-09 LAB — BASIC METABOLIC PANEL
BUN: 12 mg/dL (ref 6–23)
CO2: 24 mEq/L (ref 19–32)
Calcium: 8.7 mg/dL (ref 8.4–10.5)
Creatinine, Ser: 0.82 mg/dL (ref 0.50–1.35)
Glucose, Bld: 220 mg/dL — ABNORMAL HIGH (ref 70–99)
Sodium: 132 mEq/L — ABNORMAL LOW (ref 135–145)

## 2013-03-09 LAB — GLUCOSE, CAPILLARY
Glucose-Capillary: 218 mg/dL — ABNORMAL HIGH (ref 70–99)
Glucose-Capillary: 181 mg/dL — ABNORMAL HIGH (ref 70–99)

## 2013-03-09 LAB — C-REACTIVE PROTEIN: CRP: 4.2 mg/dL — ABNORMAL HIGH (ref <0.60)

## 2013-03-09 LAB — CBC
HCT: 39.5 % (ref 39.0–52.0)
Hemoglobin: 13.8 g/dL (ref 13.0–17.0)
MCH: 32.4 pg (ref 26.0–34.0)
MCV: 92.7 fL (ref 78.0–100.0)
RBC: 4.26 MIL/uL (ref 4.22–5.81)

## 2013-03-09 MED ORDER — INSULIN GLARGINE 100 UNIT/ML ~~LOC~~ SOLN
11.0000 [IU] | Freq: Every day | SUBCUTANEOUS | Status: DC
  Administered 2013-03-10 – 2013-03-12 (×3): 11 [IU] via SUBCUTANEOUS
  Filled 2013-03-09 (×4): qty 0.11

## 2013-03-09 MED ORDER — HYDROCORTISONE 0.5 % EX CREA
TOPICAL_CREAM | Freq: Two times a day (BID) | CUTANEOUS | Status: DC
  Administered 2013-03-09 (×2): via TOPICAL
  Filled 2013-03-09: qty 28.35

## 2013-03-09 NOTE — Progress Notes (Signed)
Patient ID: [name], male   DOB: 03-13-1933, 79 y.o.   MRN: [id number] I discussed the situation with Dr. Lovell Sheehan in call for neurosurgery. He would be helpful to have an MRI of the finger. Certainly if the MRI does not show marrow edema 1 would not move forth with a biopsy of the phalanx. Thus I think it would be helpful to get the MRI as long as there is no contraindications. I discussed with Dr. Lovell Sheehan whether we could remove his skin clips and he felt it would be very safe given the date of surgery from Dr. Jordan Likes  July 28. Thus we'll plan for an MRI tomorrow to rule out osteomyelitis of the finger. If in fact there is a high degree of marrow edema I would move forward with a biopsy. Dominica Severin M.D.

## 2013-03-09 NOTE — Progress Notes (Signed)
Subjective: No complaints.  Denies headache, confusion.   right middle finger pain under his fingernail/finger.   Objective: Vital signs in last 24 hours: Temp:  [97.5 F (36.4 C)-99 F (37.2 C)] 97.5 F (36.4 C) (08/02 0749) Pulse Rate:  [66-75] 74 (08/02 1000) Resp:  [17-24] 22 (08/02 0900) BP: (97-133)/(43-76) 104/46 mmHg (08/02 1000) SpO2:  [91 %-100 %] 96 % (08/02 1000) Weight:  [104.5 kg (230 lb 6.1 oz)] 104.5 kg (230 lb 6.1 oz) (08/02 0500) Weight change: 1 kg (2 lb 3.3 oz) Last BM Date: 03/09/13  CBG (last 3)   Recent Labs  03/08/13 1653 03/08/13 2207 03/09/13 0751  GLUCAP 233* 221* 218*    Intake/Output from previous day: 08/01 0701 - 08/02 0700 In: 360 [P.O.:360] Out: 75 [Drains:75] Intake/Output this shift: Total I/O In: 240 [P.O.:240] Out: -   General appearance: alert and no distress Scalp with staples Eyes: no scleral icterus Throat: oropharynx moist without erythema Resp: clear to auscultation bilaterally Cardio: regular rate and rhythm GI: soft, non-tender; bowel sounds normal; no masses,  no organomegaly Extremities: no clubbing, cyanosis or edema Neuro: strength 5/5 in all extremities, oriented to place, time. R Middle finger - stiff.  Tender at DIP joint and under fingernail.  No redness or warmth or swelling.  Lab Results:  Recent Labs  03/08/13 0449 03/09/13 0415  NA 136 132*  K 4.2 4.6  CL 103 101  CO2 24 24  GLUCOSE 189* 220*  BUN 11 12  CREATININE 0.80 0.82  CALCIUM 8.7 8.7     Recent Labs  03/08/13 0449 03/09/13 0415  WBC 9.2 9.7  HGB 13.7 13.8  HCT 38.4* 39.5  MCV 92.8 92.7  PLT 294 291   Lab Results  Component Value Date   INR 1.16 03/04/2013   INR 1.09 11/14/2010   INR 1.0 12/05/2007    Studies/Results: Ct Head Wo Contrast  03/08/2013   *RADIOLOGY REPORT*  Clinical Data: Subdural hematoma.  CT HEAD WITHOUT CONTRAST  Technique:  Contiguous axial images were obtained from the base of the skull through the vertex  without contrast.  Comparison: 03/06/2013.  Findings: Subdural drain via right frontal burr hole.  The drain is directed anteriorly -unchanged from prior.  The data was manipulated on reformatting software to make imaging axis similar to prior. Unfortunately these images could not be sent to PACS.  A right convexity mixed density subdural hematoma is size stable, with the largest component measuring nearly 3.5 cm.  Anteriorly, there is a discrete hypodense collection, separated from a heterogeneously high density component by a septation.  The drain appears to be preferentially within the high density component. Right-to-left midline shift is similar to prior, 10 mm at the anterior septum pellucidum.  Scattered subarachnoid hemorrhage is similar to decreased.  No hydrocephalous.  No evidence of acute ischemia.  Remote inferior left occipital lobe infarction.  Extensive intracranial atherosclerosis.  IMPRESSION:  1. When accounting for different scan angle, unchanged volume of septated subdural hematoma along the right cerebral convexity.  2. No increase in small volume bilateral subarachnoid hemorrhage. No hydrocephalus.   Original Report Authenticated By: Tiburcio Pea   Dg Swallowing Func-speech Pathology  03/07/2013   [name], CCC-SLP     03/07/2013 12:09 PM Objective Swallowing Evaluation: Modified Barium Swallowing Study   Patient Details  Name: [name] MRN: [id number] Date of Birth: 08-22-1932  Today's Date: 03/07/2013 Time: 1100-1134 SLP Time Calculation (min): 34 min  Past Medical History:  Past Medical  History  Diagnosis Date  . Diabetes mellitus   . Hypertension   . Testicular cancer dx'd 08/2010    surg only  . CHF (congestive heart failure)   . CAD (coronary artery disease)   . Peripheral vascular disease   . Vitamin B12 deficiency   . Diabetic retinopathy   . Diabetic peripheral neuropathy   . Cholelithiasis   . Family history of colon cancer     brother   . 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 Below knee leg amputation  2005    Right  . Below knee leg amputation  2006    Left  . Appendectomy    . Eye surgery  2012    Laser  . Ptca    . Ines Bloomer hole Right 03/04/2013    Procedure: [name];  Surgeon: Temple Pacini, MD;  Location:  [facility];  Service: Neurosurgery;  Laterality: Right;  [name]    HPI:  79 year old male with multiple medical problems including  diabetes mellitus hypertension coronary artery disease,  congestive heart failure, history of testicular cancer, history  of liposarcoma, and possible recently diagnosed pneumonia  presents with a one-month history of progressive failure to  thrive at home. Over the past 36 hours the patient is exhibited  significant left-sided weakness. Over the past 24 hours he has  begun to complain of headaches. He said no seizures. He has  suffered multiple falls but cannot recall any one specific  particular fall that was noteworthy. Dx: Large right frontal  chronic subdural hematoma with marked mass effect. Pt underwent  Right frontal parietal burr hole with evacuation of chronic  subdural hematoma. Placement of subdural drain. on 7/28. Pt also  with Community acquired pneumonia- continue Rocephin and Azithro  for CAP vs. Aspiration pneumonia.     Assessment / Plan / Recommendation Clinical Impression  Dysphagia Diagnosis: Moderate pharyngeal phase dysphagia Clinical impression: Pt demonstrates a mild to moderate  oropharyngeal dysphagia characterized by mildly weak base of  tongue retraction with mild vallecular residuals. Most  significantly, pt demonstrates sensory deficits leading to  delayed swallow response with silent frank penetration before the  swallow. Pt positioning in the study was not optimal, pt with  forward leaning position with poor trunk/head support which may  have contributed to deficits. With therapeutic intervention for a  chin tuck pt was able to protect  airway prior to the swallow.  This was most efficiently completed by cueing pt to tuck chin,  then sip from the staw with chin down. Thickened liquids did not  prevent aspiration. Recommend pt initiate a regular texture diet  with thin liquids with full supervision for a chin tuck. Pt will  need ongoing therapy to habitualize the chin tuck. If plan if for  d/c hom, rec HH SLP.     Treatment Recommendation  Therapy as outlined in treatment plan below    Diet Recommendation Regular;Thin liquid   Liquid Administration via: Straw;Cup Medication Administration: Whole meds with puree Supervision: Patient able to self feed;Full supervision/cueing  for compensatory strategies Compensations: Small sips/bites;Slow rate Postural Changes and/or Swallow Maneuvers: Chin tuck;Upright  30-60 min after meal;Seated upright 90 degrees    Other  Recommendations Oral Care Recommendations: Oral care BID   Follow Up Recommendations  Home health SLP    Frequency and Duration min 2x/week  2 weeks   Pertinent Vitals/Pain NA    SLP  Swallow Goals Patient will utilize recommended strategies during swallow to  increase swallowing safety with: Minimal cueing;Minimal  assistance Swallow Study Goal #2 - Progress: Progressing toward goal   General HPI: 79 year old male with multiple medical problems  including diabetes mellitus hypertension coronary artery disease,  congestive heart failure, history of testicular cancer, history  of liposarcoma, and possible recently diagnosed pneumonia  presents with a one-month history of progressive failure to  thrive at home. Over the past 36 hours the patient is exhibited  significant left-sided weakness. Over the past 24 hours he has  begun to complain of headaches. He said no seizures. He has  suffered multiple falls but cannot recall any one specific  particular fall that was noteworthy. Dx: Large right frontal  chronic subdural hematoma with marked mass effect. Pt underwent  Right frontal parietal burr hole  with evacuation of chronic  subdural hematoma. Placement of subdural drain. on 7/28. Pt also  with Community acquired pneumonia- continue Rocephin and Azithro  for CAP vs. Aspiration pneumonia. Type of Study: Modified Barium Swallowing Study Reason for Referral: Objectively evaluate swallowing function Diet Prior to this Study: Dysphagia 1 (puree);Nectar-thick  liquids Temperature Spikes Noted: No Respiratory Status: Room air History of Recent Intubation: Yes Length of Intubations (days): 1 days Date extubated: 03/03/13 Behavior/Cognition: Alert;Cooperative Oral Cavity - Dentition: Dentures, top;Dentures, bottom Oral Motor / Sensory Function: Within functional limits Self-Feeding Abilities: Able to feed self;Needs assist Patient Positioning: Postural control interferes with function Baseline Vocal Quality: Clear Volitional Cough: Strong Volitional Swallow: Able to elicit Anatomy: Within functional limits Pharyngeal Secretions: Not observed secondary MBS    Reason for Referral Objectively evaluate swallowing function   Oral Phase Oral Preparation/Oral Phase Oral Phase: WFL   Pharyngeal Phase Pharyngeal Phase Pharyngeal Phase: Impaired Pharyngeal - Nectar Pharyngeal - Nectar Cup: Delayed swallow  initiation;Penetration/Aspiration before swallow;Pharyngeal  residue - valleculae;Reduced tongue base retraction Penetration/Aspiration details (nectar cup): Material enters  airway, CONTACTS cords and not ejected out;Material enters  airway, CONTACTS cords then ejected out;Material enters airway,  remains ABOVE vocal cords and not ejected out Pharyngeal - Thin Pharyngeal - Thin Cup: Delayed swallow  initiation;Penetration/Aspiration before swallow;Reduced tongue  base retraction;Pharyngeal residue - valleculae Penetration/Aspiration details (thin cup): Material enters  airway, remains ABOVE vocal cords and not ejected out;Material  enters airway, CONTACTS cords then ejected out;Material enters  airway, CONTACTS cords and not  ejected out Pharyngeal - Solids Pharyngeal - Puree: Pharyngeal residue - valleculae;Reduced  tongue base retraction Pharyngeal - Regular: Reduced tongue base retraction;Pharyngeal  residue - valleculae Pharyngeal - Pill: Reduced tongue base retraction;Pharyngeal  residue - valleculae  Cervical Esophageal Phase    GO             [name] Ditty, MA CCC-SLP [phone]  [name], [name] Nearing 03/07/2013, 12:07 PM      Medications: Scheduled: . amoxicillin-clavulanate  1 tablet Oral Q12H  . insulin aspart  0-15 Units Subcutaneous TID WC  . insulin aspart  0-5 Units Subcutaneous QHS  . insulin aspart  4 Units Subcutaneous TID WC  . insulin glargine  8 Units Subcutaneous QAC breakfast  . levETIRAcetam  500 mg Oral BID  . pantoprazole  40 mg Oral Q1200   Continuous:   Assessment/Plan: Principal Problem:  1.Chronic subdural hematoma- improving mental status. Left sided weakness has resolved. Post-op management per Neurosurgery. Postop day #5: The patient is stable for transfer to the floor.  Active Problems:  2. Community acquired pneumonia-continue Augmentin to complete 7 day course of Abx  on 8/4.  Incentive Spirometry. 3. DIABETES MELLITUS- continue Lantus and SSI qAC- CBGs 200-233. Adjust. 4. HYPERTENSION- BP stable off medications  5. CAD- resume medical therapy when BP will tolerate.  6. CHF- appears euvolemic.  7. PVD s/p BKA- ASA, Plavix on hold due to #1.  8. Disposition- anticipate discharge to SNF when stable per NSU. Continue PT/OT/ST.  9. Finger pain without obvious finding - R Middle finger - stiff.  Tender at DIP joint and under fingernail.  No redness or warmth or swelling.  No gout appreciated.  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  He says Sxs x 4 days.  Will x ray and try HC creams at finger.  No infection noted.  May need ortho   We will continue to follow to assist with medical issues.  Labs are fine.   LOS: 5 days   [name] Vorndran  [name] 03/09/2013, 10:48 AM

## 2013-03-09 NOTE — Progress Notes (Signed)
X ray reviewed. ?Osteo???? I called Dr Amanda Pea and discussed the pts case. SED Rate and CRP ordered. Dr Amanda Pea reviewed the x ray with me and will see him later.

## 2013-03-09 NOTE — Progress Notes (Signed)
Patient ID: [name], male   DOB: 1932/08/20, 79 y.o.   MRN: [id number] Subjective:  The patient is alert and pleasant. He complains of some right middle finger pain under his fingernail.  Objective: Vital signs in last 24 hours: Temp:  [97.5 F (36.4 C)-99 F (37.2 C)] 97.5 F (36.4 C) (08/02 0749) Pulse Rate:  [66-75] 71 (08/02 0800) Resp:  [17-24] 23 (08/02 0800) BP: (97-133)/(43-76) 116/51 mmHg (08/02 0800) SpO2:  [91 %-100 %] 92 % (08/02 0800) Weight:  [104.5 kg (230 lb 6.1 oz)] 104.5 kg (230 lb 6.1 oz) (08/02 0500)  Intake/Output from previous day: 08/01 0701 - 08/02 0700 In: 360 [P.O.:360] Out: 75 [Drains:75] Intake/Output this shift:    Physical exam patient is alert and oriented. He is moving his extremities. His wound is healing well.  Lab Results:  Recent Labs  03/08/13 0449 03/09/13 0415  WBC 9.2 9.7  HGB 13.7 13.8  HCT 38.4* 39.5  PLT 294 291   BMET  Recent Labs  03/08/13 0449 03/09/13 0415  NA 136 132*  K 4.2 4.6  CL 103 101  CO2 24 24  GLUCOSE 189* 220*  BUN 11 12  CREATININE 0.80 0.82  CALCIUM 8.7 8.7    Studies/Results: Ct Head Wo Contrast  03/08/2013   *RADIOLOGY REPORT*  Clinical Data: Subdural hematoma.  CT HEAD WITHOUT CONTRAST  Technique:  Contiguous axial images were obtained from the base of the skull through the vertex without contrast.  Comparison: 03/06/2013.  Findings: Subdural drain via right frontal burr hole.  The drain is directed anteriorly -unchanged from prior.  The data was manipulated on reformatting software to make imaging axis similar to prior. Unfortunately these images could not be sent to PACS.  A right convexity mixed density subdural hematoma is size stable, with the largest component measuring nearly 3.5 cm.  Anteriorly, there is a discrete hypodense collection, separated from a heterogeneously high density component by a septation.  The drain appears to be preferentially within the high density component.  Right-to-left midline shift is similar to prior, 10 mm at the anterior septum pellucidum.  Scattered subarachnoid hemorrhage is similar to decreased.  No hydrocephalous.  No evidence of acute ischemia.  Remote inferior left occipital lobe infarction.  Extensive intracranial atherosclerosis.  IMPRESSION:  1. When accounting for different scan angle, unchanged volume of septated subdural hematoma along the right cerebral convexity.  2. No increase in small volume bilateral subarachnoid hemorrhage. No hydrocephalus.   Original Report Authenticated By: Tiburcio Pea   Dg Swallowing Func-speech Pathology  03/07/2013   Riley Nearing Deblois, CCC-SLP     03/07/2013 12:09 PM Objective Swallowing Evaluation: Modified Barium Swallowing Study   Patient Details  Name: [name] MRN: [id number] Date of Birth: Mar 31, 1933  Today's Date: 03/07/2013 Time: 1100-1134 SLP Time Calculation (min): 34 min  Past Medical History:  Past Medical History  Diagnosis Date  . Diabetes mellitus   . Hypertension   . Testicular cancer dx'd 08/2010    surg only  . CHF (congestive heart failure)   . CAD (coronary artery disease)   . Peripheral vascular disease   . Vitamin B12 deficiency   . Diabetic retinopathy   . Diabetic peripheral neuropathy   . Cholelithiasis   . Family history of colon cancer     brother   . 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 Below knee leg amputation  2005    Right  .  Below knee leg amputation  2006    Left  . Appendectomy    . Eye surgery  2012    Laser  . Ptca    . Ines Bloomer hole Right 03/04/2013    Procedure: [name];  Surgeon: Temple Pacini, MD;  Location:  [facility];  Service: Neurosurgery;  Laterality: Right;  [name]    HPI:  79 year old male with multiple medical problems including  diabetes mellitus hypertension coronary artery disease,  congestive heart failure, history of testicular cancer, history  of liposarcoma, and possible recently  diagnosed pneumonia  presents with a one-month history of progressive failure to  thrive at home. Over the past 36 hours the patient is exhibited  significant left-sided weakness. Over the past 24 hours he has  begun to complain of headaches. He said no seizures. He has  suffered multiple falls but cannot recall any one specific  particular fall that was noteworthy. Dx: Large right frontal  chronic subdural hematoma with marked mass effect. Pt underwent  Right frontal parietal burr hole with evacuation of chronic  subdural hematoma. Placement of subdural drain. on 7/28. Pt also  with Community acquired pneumonia- continue Rocephin and Azithro  for CAP vs. Aspiration pneumonia.     Assessment / Plan / Recommendation Clinical Impression  Dysphagia Diagnosis: Moderate pharyngeal phase dysphagia Clinical impression: Pt demonstrates a mild to moderate  oropharyngeal dysphagia characterized by mildly weak base of  tongue retraction with mild vallecular residuals. Most  significantly, pt demonstrates sensory deficits leading to  delayed swallow response with silent frank penetration before the  swallow. Pt positioning in the study was not optimal, pt with  forward leaning position with poor trunk/head support which may  have contributed to deficits. With therapeutic intervention for a  chin tuck pt was able to protect airway prior to the swallow.  This was most efficiently completed by cueing pt to tuck chin,  then sip from the staw with chin down. Thickened liquids did not  prevent aspiration. Recommend pt initiate a regular texture diet  with thin liquids with full supervision for a chin tuck. Pt will  need ongoing therapy to habitualize the chin tuck. If plan if for  d/c hom, rec HH SLP.     Treatment Recommendation  Therapy as outlined in treatment plan below    Diet Recommendation Regular;Thin liquid   Liquid Administration via: Straw;Cup Medication Administration: Whole meds with puree Supervision: Patient able to  self feed;Full supervision/cueing  for compensatory strategies Compensations: Small sips/bites;Slow rate Postural Changes and/or Swallow Maneuvers: Chin tuck;Upright  30-60 min after meal;Seated upright 90 degrees    Other  Recommendations Oral Care Recommendations: Oral care BID   Follow Up Recommendations  Home health SLP    Frequency and Duration min 2x/week  2 weeks   Pertinent Vitals/Pain NA    SLP Swallow Goals Patient will utilize recommended strategies during swallow to  increase swallowing safety with: Minimal cueing;Minimal  assistance Swallow Study Goal #2 - Progress: Progressing toward goal   General HPI: 79 year old male with multiple medical problems  including diabetes mellitus hypertension coronary artery disease,  congestive heart failure, history of testicular cancer, history  of liposarcoma, and possible recently diagnosed pneumonia  presents with a one-month history of progressive failure to  thrive at home. Over the past 36 hours the patient is exhibited  significant left-sided weakness. Over the past 24 hours he has  begun to complain of headaches. He said no seizures. He has  suffered multiple falls but cannot recall any one specific  particular fall that was noteworthy. Dx: Large right frontal  chronic subdural hematoma with marked mass effect. Pt underwent  Right frontal parietal burr hole with evacuation of chronic  subdural hematoma. Placement of subdural drain. on 7/28. Pt also  with Community acquired pneumonia- continue Rocephin and Azithro  for CAP vs. Aspiration pneumonia. Type of Study: Modified Barium Swallowing Study Reason for Referral: Objectively evaluate swallowing function Diet Prior to this Study: Dysphagia 1 (puree);Nectar-thick  liquids Temperature Spikes Noted: No Respiratory Status: Room air History of Recent Intubation: Yes Length of Intubations (days): 1 days Date extubated: 03/03/13 Behavior/Cognition: Alert;Cooperative Oral Cavity - Dentition: Dentures, top;Dentures,  bottom Oral Motor / Sensory Function: Within functional limits Self-Feeding Abilities: Able to feed self;Needs assist Patient Positioning: Postural control interferes with function Baseline Vocal Quality: Clear Volitional Cough: Strong Volitional Swallow: Able to elicit Anatomy: Within functional limits Pharyngeal Secretions: Not observed secondary MBS    Reason for Referral Objectively evaluate swallowing function   Oral Phase Oral Preparation/Oral Phase Oral Phase: WFL   Pharyngeal Phase Pharyngeal Phase Pharyngeal Phase: Impaired Pharyngeal - Nectar Pharyngeal - Nectar Cup: Delayed swallow  initiation;Penetration/Aspiration before swallow;Pharyngeal  residue - valleculae;Reduced tongue base retraction Penetration/Aspiration details (nectar cup): Material enters  airway, CONTACTS cords and not ejected out;Material enters  airway, CONTACTS cords then ejected out;Material enters airway,  remains ABOVE vocal cords and not ejected out Pharyngeal - Thin Pharyngeal - Thin Cup: Delayed swallow  initiation;Penetration/Aspiration before swallow;Reduced tongue  base retraction;Pharyngeal residue - valleculae Penetration/Aspiration details (thin cup): Material enters  airway, remains ABOVE vocal cords and not ejected out;Material  enters airway, CONTACTS cords then ejected out;Material enters  airway, CONTACTS cords and not ejected out Pharyngeal - Solids Pharyngeal - Puree: Pharyngeal residue - valleculae;Reduced  tongue base retraction Pharyngeal - Regular: Reduced tongue base retraction;Pharyngeal  residue - valleculae Pharyngeal - Pill: Reduced tongue base retraction;Pharyngeal  residue - valleculae  Cervical Esophageal Phase    GO             Harlon Ditty, MA CCC-SLP [phone]  DeBlois, Riley Nearing 03/07/2013, 12:07 PM     Assessment/Plan: Postop day #5: The patient is stable for transfer to the floor.  Right fingernail pain: We will observe this. If it continues we may need to have him seen by an orthopedic  surgeon.  LOS: 5 days     Zuleika Gallus D 03/09/2013, 9:03 AM

## 2013-03-09 NOTE — Consult Note (Addendum)
Reason for Consult: Middle finger pain right hand with abnormal x-ray and abnormal nail findings Referring Physician: [name] is an 79 y.o. male.  HPI: This is a pleasant male 79 years of age who presents with multiple problems medically. He recently underwent surgical decompression of a hematoma by Dr. Jordan [name] as the records note. He is just now leaving the intensive care unit of the neurosurgical suite. The patient complains of right middle finger pain. Due to the persistent pain complaints over the last 4 days an x-ray was ordered which showed abnormality and some degree of erosion about the ulnar aspect of the distal phalanx.. The patient states is not bothered him before and he has never had nail discoloration until now. He has distinct nail discoloration on the middle finger whereas the index ring and small have normal refill look to the area underneath the hard nail plate. The middle finger is very darkened over the nail.  The a patient is alert and oriented he is the appropriate.  Past Medical History  Diagnosis Date  . Diabetes mellitus   . Hypertension   . Testicular cancer dx'd 08/2010    surg only  . CHF (congestive heart failure)   . CAD (coronary artery disease)   . Peripheral vascular disease   . Vitamin B12 deficiency   . Diabetic retinopathy   . Diabetic peripheral neuropathy   . Cholelithiasis   . Family history of colon cancer     brother   . 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 Below knee leg amputation  2005    Right  . Below knee leg amputation  2006    Left  . Appendectomy    . Eye surgery  2012    Laser  . Ptca    . Ines Bloomer hole Right 03/04/2013    Procedure: [name];  Surgeon: Temple Pacini, MD;  Location: [facility];  Service: Neurosurgery;  Laterality: Right;  [name]     Family History  Problem Relation Age of Onset  . Colon cancer Brother     Social History:  reports that he has quit smoking.  He has never used smokeless tobacco. He reports that he does not drink alcohol or use illicit drugs.  Allergies: No Known Allergies  Medications: I have reviewed the patient's current medications.  Results for orders placed during the hospital encounter of 03/04/13 (from the past 48 hour(s))  GLUCOSE, CAPILLARY     Status: Abnormal   Collection Time    03/07/13 10:19 PM      Result Value Range   Glucose-Capillary 169 (*) 70 - 99 mg/dL   Comment 1 Notify RN     Comment 2 Documented in Chart    CBC     Status: Abnormal   Collection Time    03/08/13  4:49 AM      Result Value Range   WBC 9.2  4.0 - 10.5 K/uL   RBC 4.14 (*) 4.22 - 5.81 MIL/uL   Hemoglobin 13.7  13.0 - 17.0 g/dL   HCT 16.1 (*) 09.6 - 04.5 %   MCV 92.8  78.0 - 100.0 fL   MCH 33.1  26.0 - 34.0 pg   MCHC 35.7  30.0 - 36.0 g/dL   RDW 40.9  81.1 - 91.4 %   Platelets 294  150 - 400 K/uL  BASIC METABOLIC PANEL     Status: Abnormal   Collection Time  03/08/13  4:49 AM      Result Value Range   Sodium 136  135 - 145 mEq/L   Potassium 4.2  3.5 - 5.1 mEq/L   Chloride 103  96 - 112 mEq/L   CO2 24  19 - 32 mEq/L   Glucose, Bld 189 (*) 70 - 99 mg/dL   BUN 11  6 - 23 mg/dL   Creatinine, Ser 4.09  0.50 - 1.35 mg/dL   Calcium 8.7  8.4 - 81.1 mg/dL   GFR calc non Af Amer 84 (*) >90 mL/min   GFR calc Af Amer >90  >90 mL/min   Comment:            The eGFR has been calculated     using the CKD EPI equation.     This calculation has not been     validated in all clinical     situations.     eGFR's persistently     <90 mL/min signify     possible Chronic Kidney Disease.  GLUCOSE, CAPILLARY     Status: Abnormal   Collection Time    03/08/13  7:53 AM      Result Value Range   Glucose-Capillary 151 (*) 70 - 99 mg/dL  GLUCOSE, CAPILLARY     Status: Abnormal   Collection Time    03/08/13 12:10 PM      Result Value Range   Glucose-Capillary 200 (*) 70 - 99 mg/dL  GLUCOSE, CAPILLARY     Status: Abnormal   Collection Time     03/08/13  4:53 PM      Result Value Range   Glucose-Capillary 233 (*) 70 - 99 mg/dL   Comment 1 Notify RN     Comment 2 Documented in Chart    GLUCOSE, CAPILLARY     Status: Abnormal   Collection Time    03/08/13 10:07 PM      Result Value Range   Glucose-Capillary 221 (*) 70 - 99 mg/dL  CBC     Status: None   Collection Time    03/09/13  4:15 AM      Result Value Range   WBC 9.7  4.0 - 10.5 K/uL   RBC 4.26  4.22 - 5.81 MIL/uL   Hemoglobin 13.8  13.0 - 17.0 g/dL   HCT 91.4  78.2 - 95.6 %   MCV 92.7  78.0 - 100.0 fL   MCH 32.4  26.0 - 34.0 pg   MCHC 34.9  30.0 - 36.0 g/dL   RDW 21.3  08.6 - 57.8 %   Platelets 291  150 - 400 K/uL  BASIC METABOLIC PANEL     Status: Abnormal   Collection Time    03/09/13  4:15 AM      Result Value Range   Sodium 132 (*) 135 - 145 mEq/L   Potassium 4.6  3.5 - 5.1 mEq/L   Chloride 101  96 - 112 mEq/L   CO2 24  19 - 32 mEq/L   Glucose, Bld 220 (*) 70 - 99 mg/dL   BUN 12  6 - 23 mg/dL   Creatinine, Ser 4.69  0.50 - 1.35 mg/dL   Calcium 8.7  8.4 - 62.9 mg/dL   GFR calc non Af Amer 83 (*) >90 mL/min   GFR calc Af Amer >90  >90 mL/min   Comment:            The eGFR has been calculated     using the CKD  EPI equation.     This calculation has not been     validated in all clinical     situations.     eGFR's persistently     <90 mL/min signify     possible Chronic Kidney Disease.  GLUCOSE, CAPILLARY     Status: Abnormal   Collection Time    03/09/13  7:51 AM      Result Value Range   Glucose-Capillary 218 (*) 70 - 99 mg/dL   Comment 1 Notify RN     Comment 2 Documented in Chart    GLUCOSE, CAPILLARY     Status: Abnormal   Collection Time    03/09/13 12:37 PM      Result Value Range   Glucose-Capillary 181 (*) 70 - 99 mg/dL  SEDIMENTATION RATE     Status: Abnormal   Collection Time    03/09/13  2:47 PM      Result Value Range   Sed Rate 60 (*) 0 - 16 mm/hr  GLUCOSE, CAPILLARY     Status: Abnormal   Collection Time    03/09/13   4:32 PM      Result Value Range   Glucose-Capillary 152 (*) 70 - 99 mg/dL    Ct Head Wo Contrast  03/08/2013   *RADIOLOGY REPORT*  Clinical Data: Subdural hematoma.  CT HEAD WITHOUT CONTRAST  Technique:  Contiguous axial images were obtained from the base of the skull through the vertex without contrast.  Comparison: 03/06/2013.  Findings: Subdural drain via right frontal burr hole.  The drain is directed anteriorly -unchanged from prior.  The data was manipulated on reformatting software to make imaging axis similar to prior. Unfortunately these images could not be sent to PACS.  A right convexity mixed density subdural hematoma is size stable, with the largest component measuring nearly 3.5 cm.  Anteriorly, there is a discrete hypodense collection, separated from a heterogeneously high density component by a septation.  The drain appears to be preferentially within the high density component. Right-to-left midline shift is similar to prior, 10 mm at the anterior septum pellucidum.  Scattered subarachnoid hemorrhage is similar to decreased.  No hydrocephalous.  No evidence of acute ischemia.  Remote inferior left occipital lobe infarction.  Extensive intracranial atherosclerosis.  IMPRESSION:  1. When accounting for different scan angle, unchanged volume of septated subdural hematoma along the right cerebral convexity.  2. No increase in small volume bilateral subarachnoid hemorrhage. No hydrocephalus.   Original Report Authenticated By: Tiburcio Pea   Dg Finger Middle Right  03/09/2013   *RADIOLOGY REPORT*  Clinical Data: Pain and swelling of the distal middle digit  RIGHT MIDDLE FINGER 2+V  Comparison: None.  Findings: Diffuse soft tissue swelling about the distal phalanx of the middle finger.  There is some rarefaction of the ulnar aspect of the tuft of the distal phalanx concerning for osteomyelitis. The remainder the visualized bones and joints are within normal limits.  IMPRESSION: Soft tissue  swelling about the distal phalanx with mild rarefaction of the ulnar aspect of the tuft of the distal phalanx and early osteomyelitis.   Original Report Authenticated By: Malachy Moan, M.D.    Review of Systems  Respiratory: Negative.   Gastrointestinal: Negative.   Genitourinary: Negative.   Psychiatric/Behavioral: Negative.    Blood pressure 120/70, pulse 76, temperature 98 F (36.7 C), temperature source Oral, resp. rate 20, height 4\' 11"  (1.499 m), weight 104.5 kg (230 lb 6.1 oz), SpO2 100.00%. Physical Exam right middle finger  has abnormal male coloration over the hard nail. He has normal pulp refill. He has ability to move the finger but does not make a full fist. He is exquisitely sensitive and painful over the dorsal aspect of the finger. He denies locking popping catching. Ligamentously he is stable. I have reviewed this at length and his findings. In my opinion this patient has abnormality about the nail consistent with chronic abnormal growth. He has a little bit of clubbing about all the finger nails but certainly the middle finger has excessive tissue buildup beneath the hard nail plate which we see  as well as some degree of exquisite pain with palpation. This is correlated with his x-ray which is abnormal about the ulnar corner.  The remaining arm examination is unremarkable. The opposite left upper extremity is without obvious abnormality or distinct finger nail or wrist pain. I reviewed his findings at length and reviewed his chart.  Assessment/Plan: Right middle finger pain with radiographic abnormality and nail plate as well as nailbed objective abnormality.  Certainly in this situation one has to consider infection as an etiology. Occasionally fungual infections as well as melanoma or a blue nevus can present in this fashion. This could also be a situation where chronic heaped up and nail growth has caused lift off of the nail plate at the nail and sterile matrix interface  which is simply exquisitely painful.  Given the findings I would recommend a procedure to try and give him some relief and to obtain a biopsy and culture if necessary based upon the conditions. I discussed with patient that this could be done at bedside with local anesthetic. I will plan to do this tomorrow if his finger is still in a highly painful state. We would typically perform a intermetacarpal block followed by hard nail plate removal and biopsy as well as look at the area in question about the sterile matrix. The other option is to obtain an MRI scan to evaluate the finger for marrow edema. If the distal phalanx appears normal on her MRI then certainly one would exclude osteomyelitis as a possibility.  I discussed these issues with the patient. I will see him in the morning and will make a decision.  Karen Chafe 03/09/2013, 8:23 PM

## 2013-03-10 ENCOUNTER — Inpatient Hospital Stay (HOSPITAL_COMMUNITY): Payer: Medicare Other

## 2013-03-10 LAB — BASIC METABOLIC PANEL
Calcium: 8.8 mg/dL (ref 8.4–10.5)
Chloride: 100 mEq/L (ref 96–112)
Creatinine, Ser: 0.86 mg/dL (ref 0.50–1.35)
GFR calc Af Amer: >90 mL/min (ref >90)

## 2013-03-10 LAB — GLUCOSE, CAPILLARY
Glucose-Capillary: 134 mg/dL — ABNORMAL HIGH (ref 70–99)
Glucose-Capillary: 163 mg/dL — ABNORMAL HIGH (ref 70–99)
Glucose-Capillary: 400 mg/dL — ABNORMAL HIGH (ref 70–99)
Glucose-Capillary: 80 mg/dL (ref 70–99)

## 2013-03-10 LAB — CBC
MCV: 93.9 fL (ref 78.0–100.0)
Platelets: 292 10*3/uL (ref 150–400)
RDW: 13.9 % (ref 11.5–15.5)
WBC: 9.6 10*3/uL (ref 4.0–10.5)

## 2013-03-10 MED ORDER — VANCOMYCIN HCL IN DEXTROSE 1-5 GM/200ML-% IV SOLN
1000.0000 mg | Freq: Three times a day (TID) | INTRAVENOUS | Status: DC
  Administered 2013-03-11 – 2013-03-12 (×5): 1000 mg via INTRAVENOUS
  Filled 2013-03-10 (×8): qty 200

## 2013-03-10 MED ORDER — PIPERACILLIN-TAZOBACTAM 3.375 G IVPB
3.3750 g | Freq: Three times a day (TID) | INTRAVENOUS | Status: DC
  Administered 2013-03-10 – 2013-03-11 (×2): 3.375 g via INTRAVENOUS
  Filled 2013-03-10 (×6): qty 50

## 2013-03-10 NOTE — Progress Notes (Signed)
ANTIBIOTIC CONSULT NOTE - INITIAL  Pharmacy Consult for vancomycin + Zosyn Indication: osteomyelitis  No Known Allergies  Patient Measurements: Height: 4\' 11"  (149.9 cm) Weight: 227 lb 11.8 oz (103.3 kg) IBW/kg (Calculated) : 47.7  Vital Signs: Temp: 98.1 F (36.7 C) (08/03 1000) Temp src: Oral (08/03 1000) BP: 108/54 mmHg (08/03 1000) Pulse Rate: 77 (08/03 1000) Intake/Output from previous day: 08/02 0701 - 08/03 0700 In: 720 [P.O.:720] Out: -  Intake/Output from this shift:    Labs:  Recent Labs  03/08/13 0449 03/09/13 0415 03/10/13 0515  WBC 9.2 9.7 9.6  HGB 13.7 13.8 13.6  PLT 294 291 292  CREATININE 0.80 0.82 0.86   Estimated Creatinine Clearance: 71.1 ml/min (by C-G formula based on Cr of 0.86).  Microbiology: Recent Results (from the past 720 hour(s))  MRSA PCR SCREENING     Status: None   Collection Time    03/04/13  9:01 PM      Result Value Range Status   MRSA by PCR NEGATIVE  NEGATIVE Final   Comment:            The GeneXpert MRSA Assay (FDA     approved for NASAL specimens     only), is one component of a     comprehensive MRSA colonization     surveillance program. It is not     intended to diagnose MRSA     infection nor to guide or     monitor treatment for     MRSA infections.    Medical History: Past Medical History  Diagnosis Date  . Diabetes mellitus   . Hypertension   . Testicular cancer dx'd 08/2010    surg only  . CHF (congestive heart failure)   . CAD (coronary artery disease)   . Peripheral vascular disease   . Vitamin B12 deficiency   . Diabetic retinopathy   . Diabetic peripheral neuropathy   . Cholelithiasis   . Family history of colon cancer     brother   . 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 Pharmacy asked to dose vancomycin and Zosyn. Her renal function is stable with CrCl ~10mL/min. Has most recently been on  Augmentin which he last received a dose of at 1000 this morning.  Goal of Therapy:  Vancomycin trough level 15-20 mcg/ml  Plan:  1. Start Zosyn 3.375gm IV Q8hr EI dosing 2. Vancomycin 1000mg  IV Q8hr 3. Vanc trough at SS 4. Follow up renal function, c/s, LOT, clinical progression  Juanelle Trueheart D. Trea Carnegie, PharmD Clinical Pharmacist Pager: [phone] 03/10/2013 4:27 PM

## 2013-03-10 NOTE — Progress Notes (Signed)
Patient ID: [name], male   DOB: June 01, 1933, 79 y.o.   MRN: [id number] Patient seen and examined. Patient is alert to year, resident and location.  Vital signs are stable.  We gently removed the skin clips in his scalp followed by benzoin and Steri-Strip application. He tolerated this well. He should be stable for MRI scan at this point. I discussed this with Dr. Lovell Sheehan.  Middle finger still painful he notes no dramatic changes. He is swollen at the pulp and has a rather thickened nail growth with discoloration under the nail bed.  We'll obtain MRI to evaluate the finger for abscess or osteomyelitis. We will await the results. Patient is aware of this. Patient notes no other problems in regards to his hands.  I discussed this with the patient at length and have given him an algorithm/planned to try and give him the best upper extremity function possible  All questions have been encouraged and answered  Oletta Cohn.D.

## 2013-03-10 NOTE — Progress Notes (Signed)
Clinical Social Work Department BRIEF PSYCHOSOCIAL ASSESSMENT 03/10/2013  Patient:  [name], [name]     Account Number:  [id number]     Admit date:  03/04/2013  Clinical Social Worker:  Leron Croak, CLINICAL SOCIAL WORKER  Date/Time:  03/10/2013 04:54 PM  Referred by:  Physician  Date Referred:  03/10/2013 Referred for  SNF Placement   Other Referral:   Interview type:  Patient Other interview type:   Family was also present at the bedside    PSYCHOSOCIAL DATA Living Status:  FAMILY Admitted from facility:   Level of care:   Primary support name:  Elric Tirado [phone] Primary support relationship to patient:  SPOUSE Degree of support available:   Pt has a good family and community support    CURRENT CONCERNS Current Concerns  Post-Acute Placement   Other Concerns:    SOCIAL WORK ASSESSMENT / PLAN CSW met with the Pt and the family at the bedside. CSW introduced self and CSW [place]. CSW explained the reason for the visit and assessment referral. Pt was not aware of the referral but is agreeable to searching in the [facility] area. Pt's son Lennie Vasco ([phone]) will also be assisting with SNF placement. CSW will begin SNF search for d/c planning   Assessment/plan status:  Information/Referral to [facility] Other assessment/ plan:   Information/referral to community resources:   CSW provided SNF listing for resources    PATIENT'S/FAMILY'S RESPONSE TO PLAN OF CARE: Pt and family were appreciative for assistance and d/c planning.       Leron Croak, LCSWA [facility] Emergency Dept.  [phone]

## 2013-03-10 NOTE — Progress Notes (Signed)
Patient ID: [name], male   DOB: 24-Feb-1933, 79 y.o.   MRN: [id number] Subjective:  the patient is alert and pleasant. He has no complaints other than his finger continues to hurt. He looks well.  Objective: Vital signs in last 24 hours: Temp:  [97.3 F (36.3 C)-98.6 F (37 C)] 98.1 F (36.7 C) (08/03 1000) Pulse Rate:  [73-77] 77 (08/03 1000) Resp:  [18-22] 22 (08/03 1000) BP: (106-135)/(46-70) 108/54 mmHg (08/03 1000) SpO2:  [96 %-100 %] 97 % (08/03 1000) Weight:  [103.3 kg (227 lb 11.8 oz)] 103.3 kg (227 lb 11.8 oz) (08/03 0500)  Intake/Output from previous day: 08/02 0701 - 08/03 0700 In: 720 [P.O.:720] Out: -  Intake/Output this shift:    Physical exam the patient is alert and oriented x3. His wound is healing well.  Lab Results:  Recent Labs  03/09/13 0415 03/10/13 0515  WBC 9.7 9.6  HGB 13.8 13.6  HCT 39.5 38.8*  PLT 291 292   BMET  Recent Labs  03/09/13 0415 03/10/13 0515  NA 132* 133*  K 4.6 4.6  CL 101 100  CO2 24 24  GLUCOSE 220* 142*  BUN 12 12  CREATININE 0.82 0.86  CALCIUM 8.7 8.8    Studies/Results: Dg Finger Middle Right  03/09/2013   *RADIOLOGY REPORT*  Clinical Data: Pain and swelling of the distal middle digit  RIGHT MIDDLE FINGER 2+V  Comparison: None.  Findings: Diffuse soft tissue swelling about the distal phalanx of the middle finger.  There is some rarefaction of the ulnar aspect of the tuft of the distal phalanx concerning for osteomyelitis. The remainder the visualized bones and joints are within normal limits.  IMPRESSION: Soft tissue swelling about the distal phalanx with mild rarefaction of the ulnar aspect of the tuft of the distal phalanx and early osteomyelitis.   Original Report Authenticated By: Malachy Moan, M.D.    Assessment/Plan: Postop day #6: The patient is doing well neurologically.  Finger pain: I appreciate Dr. Brunetta Genera help. An MRI has been scheduled.   LOS: 6 days     Rayneisha Bouza D 03/10/2013,  11:24 AM

## 2013-03-10 NOTE — Progress Notes (Addendum)
Patient ID: [name], male   DOB: 04-15-1933, 79 y.o.   MRN: [id number] Please see dictated  operative [phone]  This patient underwent nail plate removal and I&D of the distal phalanx. After removal of the nail it was readily apparent that the distal phalanx was involved and infected in the area which correlated with the Xray erosions.  Thus confirming the diagnosis of osteomyelitis. The patient was lavaged extensively and debrided and packed with a wet-to-dry dressing. He tolerated the procedure well.  Diagnosis: Osteomyelitis with abscess formation right middle finger  Recommendations: Would recommend IV antibiotics, daily dressing changes with therapy performing waterpicked and wet-to-dry dressing change. He can hopefully be advanced to home/outpatient wound care after cultures are final. Would recommend obtaining infectious disease consult for optimal antibiotic treatment and course.  I discussed with the patient and his family the implications and plans.  Dominica Severin MD

## 2013-03-10 NOTE — Progress Notes (Addendum)
Clinical Social Work Department CLINICAL SOCIAL WORK PLACEMENT NOTE 03/15/2013  Patient:  [name], [name]  Account Number:  [id number] Admit date:  03/04/2013  Clinical Social Worker:  Leron Croak, CLINICAL SOCIAL WORKER  Date/time:  03/10/2013 04:59 PM  Clinical Social Work is seeking post-discharge placement for this patient at the following level of care:   SKILLED NURSING   (*CSW will update this form in Epic as items are completed)   03/10/2013  Patient/family provided with [facility] Department of Clinical Social Works list of facilities offering this level of care within the geographic area requested by the patient (or if unable, by the patients family).  03/10/2013  Patient/family informed of their freedom to choose among providers that offer the needed level of care, that participate in Medicare, Medicaid or managed care program needed by the patient, have an available bed and are willing to accept the patient.  03/10/2013  Patient/family informed of [facility] ownership interest in [facility], as well as of the fact that they are under no obligation to receive care at this facility.  PASARR submitted to EDS on 03/10/2013 PASARR number received from EDS on 03/10/2013  FL2 transmitted to all facilities in geographic area requested by pt/family on  03/10/2013 FL2 transmitted to all facilities within larger geographic area on 03/10/2013  Patient informed that his/her managed care company has contracts with or will negotiate with  certain facilities, including the following:     Patient/family informed of bed offers received:  03/11/2013 Patient chooses bed at [facility] Physician recommends and patient chooses bed at  [facility]  Patient to be transferred to  on   Patient to be transferred to facility by   The following physician request were entered in Epic:   Additional Comments:  Darlyn Chamber, MSW,  LCSWA Clinical Social Work [phone]

## 2013-03-10 NOTE — Op Note (Signed)
[name], [name] NO.:  [id number]  MEDICAL RECORD NO.:  [id number]  LOCATION:  [id number]                        FACILITY:  [facility]  PHYSICIAN:  Dionne Ano. Danise Dehne, M.D.DATE OF BIRTH:  1933-08-07  DATE OF PROCEDURE: DATE OF DISCHARGE:                              OPERATIVE REPORT   PREOPERATIVE DIAGNOSIS:  Right middle finger swelling, rule out osteomyelitis.  POSTOPERATIVE DIAGNOSIS:  Definitive osteomyelitis, right middle finger.  PROCEDURE PERFORMED: 1. Nail plate removal, right middle finger. 2. I and D, osteomyelitis, focus right middle finger with aggressive     bony debridement of the distal phalanx. 3. Debridement of skin, subcutaneous tissue, and nail bed tissue     including sterile matrix (the germinal matrix was not encroached     upon).  SURGEON:  Dionne Ano. Amanda Pea, M.D.  ASSISTANT:  None.  COMPLICATIONS:  None.  ANESTHESIA:  Local intermetacarpal block.  TOURNIQUET TIME:  Less than 30 minutes.  INDICATIONS:  This is a 79 year old male with multiple medical problems as outlined in the chart.  The patient understands risks and benefits of surgery and desires to proceed.  I have counseled he and his family including his son and daughter and wife.  The patient has a erosions ulnarly about the distal phalanx and an MRI, which is suggestive of bone marrow edema signifying that certainly the bone is abnormal.  In this situation, I would recommend nail plate removal, I and D, and formal look.  The patient consents to this procedure.  DESCRIPTION OF PROCEDURE:  The patient was seen by myself, underwent intermetacarpal block.  We performed alcohol placement followed by Betadine, followed by drying time, then an intermetacarpal and flexor sheath block with lidocaine with epinephrine.  He tolerated this well. He was prepped and draped in the usual sterile fashion with Betadine scrub and paint.  Performed by myself outline marks were made.   Sterile field was secured.  Tourniquet was placed about the finger base in the form of a Penrose drain and following this, the patient then underwent nail plate removal.  The patient had a large onychomycosis and abnormalities here.  I was very careful as to not injure the nail bed. The nail bed was not injury.  The patient had the nail plate removed without difficulty, and I immediately countered underneath this foul- smelling material.  This was cultured for aerobic and anaerobic cultures.  Following this, the patient then underwent look at the nail bed.  I performed I and D of subcutaneous tissue, skin, and sterile matrix.  The germinal matrix did not require an excisional debridement.  Excisional debridement was accomplished with scissor tip, curette knife blade.  Following this, I performed a bony debridement of the distal phalanx. The patient had a sinus tract, which led right into the distal phalanx signifying definitive osteomyelitis.  At this juncture, I heavily debrided the bone, sent bony cultures as well, and following this, the patient then had copious lavage and irrigation of the wound.  Once this was done, I then performed wet-to-dry dressing change/packing and placed Xeroform Adaptic over the finger.  I feel this definitively gives him the diagnosis of osteomyelitis, all contact, all parties and  make sure that the patient is treated aggressively.  With all his multiple medical problems, we certainly want try to get him to quiescent state of affairs in regards to the distal phalanx and prevent any sepsis or hematogenous seeding elsewhere.  It was a pleasure to see him today.  These notes have been discussed. He tolerated the procedure well.  All questions have been encouraged and answered.     Dionne Ano. Amanda Pea, M.D.     [facility]  D:  03/10/2013  T:  03/10/2013  Job:  [id number]

## 2013-03-10 NOTE — Progress Notes (Signed)
Subjective:  right middle finger x ray showed Osteo so I asked Dr Amanda Pea to see Pt.  Appreciate his input.  MRI to be obtained to R/O Osteo vrs Abscess. No other C/o   Objective: Vital signs in last 24 hours: Temp:  [97.3 F (36.3 C)-98.6 F (37 C)] 97.8 F (36.6 C) (08/03 0600) Pulse Rate:  [73-76] 74 (08/03 0600) Resp:  [18-21] 18 (08/03 0600) BP: (104-135)/(46-70) 109/46 mmHg (08/03 0600) SpO2:  [96 %-100 %] 97 % (08/03 0600) Weight:  [103.3 kg (227 lb 11.8 oz)] 103.3 kg (227 lb 11.8 oz) (08/03 0500) Weight change: -1.2 kg (-2 lb 10.3 oz) Last BM Date: 03/09/13  CBG (last 3)   Recent Labs  03/09/13 1632 03/09/13 2130 03/10/13 0720  GLUCAP 152* 125* 134*    Intake/Output from previous day: 08/02 0701 - 08/03 0700 In: 720 [P.O.:720] Out: -  Intake/Output this shift:    General appearance: alert and no distress Scalp with staples Eyes: no scleral icterus Throat: oropharynx moist without erythema Resp: clear to auscultation bilaterally Cardio: regular rate and rhythm GI: soft, non-tender; bowel sounds normal; no masses,  no organomegaly Extremities: no clubbing, cyanosis or edema.  B BKA.  Has legs in room. R Middle finger - stiff.  Tender at DIP joint and under fingernail.  No redness or warmth or swelling.  Lab Results:  Recent Labs  03/09/13 0415 03/10/13 0515  NA 132* 133*  K 4.6 4.6  CL 101 100  CO2 24 24  GLUCOSE 220* 142*  BUN 12 12  CREATININE 0.82 0.86  CALCIUM 8.7 8.8     Recent Labs  03/09/13 0415 03/10/13 0515  WBC 9.7 9.6  HGB 13.8 13.6  HCT 39.5 38.8*  MCV 92.7 93.9  PLT 291 292   Lab Results  Component Value Date   INR 1.16 03/04/2013   INR 1.09 11/14/2010   INR 1.0 12/05/2007    Studies/Results: Dg Finger Middle Right  03/09/2013   *RADIOLOGY REPORT*  Clinical Data: Pain and swelling of the distal middle digit  RIGHT MIDDLE FINGER 2+V  Comparison: None.  Findings: Diffuse soft tissue swelling about the distal phalanx of the  middle finger.  There is some rarefaction of the ulnar aspect of the tuft of the distal phalanx concerning for osteomyelitis. The remainder the visualized bones and joints are within normal limits.  IMPRESSION: Soft tissue swelling about the distal phalanx with mild rarefaction of the ulnar aspect of the tuft of the distal phalanx and early osteomyelitis.   Original Report Authenticated By: Malachy Moan, M.D.     Medications: Scheduled: . amoxicillin-clavulanate  1 tablet Oral Q12H  . hydrocortisone cream   Topical BID  . insulin aspart  0-15 Units Subcutaneous TID WC  . insulin aspart  0-5 Units Subcutaneous QHS  . insulin aspart  4 Units Subcutaneous TID WC  . insulin glargine  11 Units Subcutaneous QAC breakfast  . levETIRAcetam  500 mg Oral BID  . pantoprazole  40 mg Oral Q1200   Continuous:   Assessment/Plan: Principal Problem:  1.Chronic subdural hematoma- improved mental status. Left sided weakness has resolved. Post-op management per Neurosurgery. Postop day #6: The patient is on the floor.  Active Problems:  2. Community acquired pneumonia-continue Augmentin to complete 7 day course of Abx on 8/4.  Incentive Spirometry. 3. DIABETES MELLITUS- continue Lantus and SSI qAC- CBGs 125-152 and better. 4. HYPERTENSION- BP stable off medications  5. CAD- resume medical therapy when BP will tolerate.  6. CHF- appears euvolemic.  7. PVD s/p BKA- ASA, Plavix on hold due to #1.  8. Disposition- anticipate discharge to SNF when stable per NSU. Continue PT/OT/ST.  9. Finger pain for MRI and ? Bx.  Per Dr Amanda Pea   We will continue to follow to assist with medical issues.  Labs are fine.   LOS: 6 days   Dawn Convery M 03/10/2013, 9:36 AM

## 2013-03-11 DIAGNOSIS — R29898 Other symptoms and signs involving the musculoskeletal system: Secondary | ICD-10-CM

## 2013-03-11 DIAGNOSIS — Z9889 Other specified postprocedural states: Secondary | ICD-10-CM

## 2013-03-11 DIAGNOSIS — M869 Osteomyelitis, unspecified: Secondary | ICD-10-CM

## 2013-03-11 DIAGNOSIS — I6200 Nontraumatic subdural hemorrhage, unspecified: Secondary | ICD-10-CM

## 2013-03-11 LAB — CBC
MCH: 32.8 pg (ref 26.0–34.0)
MCV: 93.5 fL (ref 78.0–100.0)
Platelets: 321 10*3/uL (ref 150–400)
RDW: 13.8 % (ref 11.5–15.5)
WBC: 10.6 10*3/uL — ABNORMAL HIGH (ref 4.0–10.5)

## 2013-03-11 LAB — URINALYSIS, ROUTINE W REFLEX MICROSCOPIC
Bilirubin Urine: NEGATIVE
Glucose, UA: NEGATIVE mg/dL
Hgb urine dipstick: NEGATIVE
Protein, ur: NEGATIVE mg/dL

## 2013-03-11 LAB — BASIC METABOLIC PANEL
Calcium: 8.9 mg/dL (ref 8.4–10.5)
Creatinine, Ser: 0.94 mg/dL (ref 0.50–1.35)
GFR calc Af Amer: >90 mL/min (ref >90)
Sodium: 131 mEq/L — ABNORMAL LOW (ref 135–145)

## 2013-03-11 LAB — GLUCOSE, CAPILLARY: Glucose-Capillary: 230 mg/dL — ABNORMAL HIGH (ref 70–99)

## 2013-03-11 MED ORDER — DEXTROSE 5 % IV SOLN
2.0000 g | Freq: Three times a day (TID) | INTRAVENOUS | Status: DC
  Administered 2013-03-11 – 2013-03-13 (×6): 2 g via INTRAVENOUS
  Filled 2013-03-11 (×8): qty 2

## 2013-03-11 MED ORDER — EZETIMIBE 10 MG PO TABS
10.0000 mg | ORAL_TABLET | Freq: Every day | ORAL | Status: DC
  Administered 2013-03-11 – 2013-03-20 (×10): 10 mg via ORAL
  Filled 2013-03-11 (×10): qty 1

## 2013-03-11 NOTE — Progress Notes (Addendum)
Physical Therapy Wound Evaluation and Treatment Patient Details  Name: [name] MRN: [id number] Date of Birth: Jul 20, 1933  Today's Date: 03/11/2013 Time: 4010-2725 Time Calculation (min): 33 min  Subjective  Subjective: Ohhh! 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 Pt with incr tone in his Rt arm that draws into flexion and makes debridement and dressing wound quite challenging. Majority of black tissue debrided today. Do not feel hydrotherapy is currently indicated due to pt's poor ability to tolerate it and/or to keep RUE still to allow effective treatment. Recommend nursing continue to do dressing changes. Recommend non-adherent dressing under saline moist gauze due to majority of wound bed is healthy granulation tissue.  Wound 03/11/13 Rt 3rd finger--distal (Active)  Site / Wound Assessment Bleeding;Granulation tissue;Red;Painful 03/11/2013 10:49 AM  % Wound base Red or Granulating 75% 03/11/2013 10:49 AM  % Wound base Yellow 5% 03/11/2013 10:49 AM  % Wound base Black 20% 03/11/2013 10:49 AM  Peri-wound Assessment Intact 03/11/2013 10:49 AM  Wound Length (cm) 2 cm 03/11/2013 10:49 AM  Wound Width (cm) 1.7 cm 03/11/2013 10:49 AM  Wound Depth (cm) 0.3 cm 03/11/2013 10:49 AM  Margins Unattacted edges (unapproximated) 03/11/2013 10:49 AM  Closure None 03/11/2013 10:49 AM  Drainage Amount Moderate 03/11/2013 10:49 AM  Drainage Description Sanguineous 03/11/2013 10:49 AM  Non-staged Wound Description Partial thickness 03/11/2013 10:49 AM  Treatment Debridement (Selective);Hydrotherapy (Pulse lavage);Packing (Saline gauze);Tape changed 03/11/2013 10:49 AM  Dressing Type Gauze (Comment);Impregnated  gauze (petrolatum);Moist to moist;Tape dressing 03/11/2013 10:49 AM  Dressing Changed Changed 03/11/2013 10:49 AM  Dressing Status Clean;Dry;Intact 03/11/2013 10:49 AM      Hydrotherapy Pulsed lavage therapy - wound location: Rt 3rd finger Pulsed Lavage with Suction (psi): 4 psi (no contact at times due to pain) Pulsed Lavage with Suction - Normal Saline Used: 500 mL Pulsed Lavage Tip: Tip with splash shield Selective Debridement Selective Debridement - Location: rt 3rd finger Selective Debridement - Tools Used: Forceps;Scissors Selective Debridement - Tissue Removed: black (?necrotic skin with dried blood)   Wound Assessment and Plan  Wound Therapy - Assess/Plan/Recommendations Wound Therapy - Clinical Statement: Pt is s/p infection of distal Rt 3rd finger with I&D including removal of nail. Pt with incr tone in his Rt arm that draws into flexion and makes debridement and dressing wound quite challenging. Majority of black tissue debrided today. Do not feel hydrotherapy is currently indicated due to pt's poor ability to tolerate it and/or to keep RUE still to allow effective treatment. Recommend nursing continue to do dressing changes. Recommend non adherent dressing under saline moist gauze due to majority of wound bed is healthy granulation tissue. Wound Therapy - Functional Problem List: limited use of Rt hand for funtional tasks Factors Delaying/Impairing Wound Healing: Diabetes Mellitus;Infection - systemic/local;Multiple medical problems Hydrotherapy Plan: Debridement;Dressing change;Patient/family education;Pulsatile lavage with suction;Other (comment) (if MD wants continued hydrotherapy) Wound Therapy - Frequency: 6X / week Wound Therapy - Follow Up Recommendations: Skilled nursing facility (nursing to do dressing changes)  Wound Therapy Goals- Improve the function of patient's integumentary system by progressing the wound(s) through the phases of wound healing (inflammation -  proliferation - remodeling) by: Decrease Necrotic Tissue to: <15 Decrease  Necrotic Tissue - Progress: Goal set today Increase Granulation Tissue to: >85 Increase Granulation Tissue - Progress: Goal set today Decrease Length/Width/Depth by (cm): 0.2 Decrease Length/Width/Depth - Progress: Goal set today Improve Drainage Characteristics: Min;Serous Improve Drainage Characteristics - Progress: Goal set today Goals/treatment plan/discharge plan were made with and agreed upon by patient/family: Yes Time For Goal Achievement: 7 days Wound Therapy - Potential for Goals: Good  Goals will be updated until maximal potential achieved or discharge criteria met.  Discharge criteria: when goals achieved, discharge from hospital, MD decision/surgical intervention, no progress towards goals, refusal/missing three consecutive treatments without notification or medical reason.  GP     [name] 03/11/2013, 11:10 AM Pager [phone]

## 2013-03-11 NOTE — Progress Notes (Signed)
Overall stable neurologically. Still with quite a bit of pain in his right hand/finger. No other new developments or problems.  Afebrile. Vitals are stable. Patient awake and conversant. Still with mild left-sided weakness but improved from preop. Wound clean and dry.  Overall stable following burr hole evacuation of subdural hematoma. Continue medical management of pneumonia and finger osteomyelitis. I am okay with skilled nursing facility transfer when patient's medical issues stabilized.

## 2013-03-11 NOTE — Progress Notes (Signed)
Subjective: Doing ok, a bit sleepy this AM.  Purple dressing on R hand with arm elevated. Objective: Vital signs in last 24 hours: Temp:  [97.6 F (36.4 C)-99 F (37.2 C)] 99 F (37.2 C) (08/04 0515) Pulse Rate:  [77-98] 98 (08/04 0515) Resp:  [18-22] 20 (08/04 0515) BP: (108-124)/(49-68) 122/58 mmHg (08/04 0515) SpO2:  [96 %-100 %] 96 % (08/04 0515) Weight:  [104 kg (229 lb 4.5 oz)] 104 kg (229 lb 4.5 oz) (08/04 0500) Weight change: 0.7 kg (1 lb 8.7 oz) Last BM Date: 03/10/13  Intake/Output from previous day: 08/03 0701 - 08/04 0700 In: 480 [P.O.:480] Out: -  Intake/Output this shift:   General appearance: alert and no distress  Scalp steri strips R side Eyes: no scleral icterus  Throat: oropharynx moist without erythema  Resp: clear to auscultation bilaterally  Cardio: regular rate and rhythm  GI: soft, non-tender; bowel sounds normal; no masses, no organomegaly  Extremities: no clubbing, cyanosis or edema. B BKA. Has legs in room.  R Middle finger - Now dressed postop  Lab Results:  Recent Labs  03/10/13 0515 03/11/13 0605  WBC 9.6 10.6*  HGB 13.6 13.6  HCT 38.8* 38.8*  PLT 292 321   BMET  Recent Labs  03/10/13 0515 03/11/13 0605  NA 133* 131*  K 4.6 4.3  CL 100 98  CO2 24 20  GLUCOSE 142* 147*  BUN 12 14  CREATININE 0.86 0.94  CALCIUM 8.8 8.9    Studies/Results: Mr Hand Right Wo Contrast  03/10/2013   *RADIOLOGY REPORT*  Clinical Data: Third digit distal phalanx pain, swelling and abnormal radiographs.  MRI OF THE RIGHT HAND WITHOUT CONTRAST  Technique:  Multiplanar, multisequence MR imaging was performed. No intravenous contrast was administered.  Comparison: Radiographs 03/09/2013  Findings: The distal phalanx of the third digit demonstrates diffuse marrow edema along with dorsal cortical thickening and a somewhat irregular wavy appearance.  There is also mild diffuse soft tissue swelling/edema and thickening of the nail bed.  No discrete soft tissue  abscess.  Findings are suspicious for osteomyelitis and dorsal periosteal reaction.  No findings to suggest septic arthritis or septic tenosynovitis. The remaining bony structures are intact and the joints are intact. There is diffuse fatty atrophy of the hand musculature.  IMPRESSION: MR findings suspicious for osteomyelitis involving the distal phalanx of the third digit.   Original Report Authenticated By: Rudie Meyer, M.D.   Dg Finger Middle Right  03/09/2013   *RADIOLOGY REPORT*  Clinical Data: Pain and swelling of the distal middle digit  RIGHT MIDDLE FINGER 2+V  Comparison: None.  Findings: Diffuse soft tissue swelling about the distal phalanx of the middle finger.  There is some rarefaction of the ulnar aspect of the tuft of the distal phalanx concerning for osteomyelitis. The remainder the visualized bones and joints are within normal limits.  IMPRESSION: Soft tissue swelling about the distal phalanx with mild rarefaction of the ulnar aspect of the tuft of the distal phalanx and early osteomyelitis.   Original Report Authenticated By: Malachy Moan, M.D.    Medications:  I have reviewed the patient's current medications. Scheduled: . insulin aspart  0-15 Units Subcutaneous TID WC  . insulin aspart  0-5 Units Subcutaneous QHS  . insulin aspart  4 Units Subcutaneous TID WC  . insulin glargine  11 Units Subcutaneous QAC breakfast  . levETIRAcetam  500 mg Oral BID  . pantoprazole  40 mg Oral Q1200  . piperacillin-tazobactam (ZOSYN)  IV  3.375  g Intravenous Q8H  . vancomycin  1,000 mg Intravenous Q8H   Continuous:  ZOX:[id number], acetaminophen, HYDROcodone-acetaminophen, HYDROmorphone (DILAUDID) injection, labetalol, morphine injection, ondansetron (ZOFRAN) IV, ondansetron, promethazine  Assessment/Plan: Principal Problem:  1.Chronic subdural hematoma- improved mental status. Left sided weakness has resolved. Post-op management per Neurosurgery. Postop day #7: The patient is on the  floor.  Active Problems:  2. Community acquired pneumonia-continue Augmentin to complete 7 day course today, but on abx for Osteomyelitis with similar coverage from Zosyn at present.. Incentive Spirometry.  3. DIABETES MELLITUS- continue Lantus and SSI qAC- CBGs.  4. HYPERTENSION- BP stable off medications  5. CAD- resume medical therapy when BP will tolerate. Neurosurgery to decide if ever a candidate for resuming Plavix given his bleed, would favor holding off. 6. CHF- appears euvolemic.  7. PVD s/p BKA- ASA, Plavix on hold due to #1.  8. Disposition- anticipate discharge to SNF when stable per NSU. Continue PT/OT/ST.  9. Finger pain- Osteo, has had operative debridement with wound instructions per Gramig.  I will arrange ID to see him today for decision making for IV abx, which can be administered at facility on SNF disposition.  Will let them decide on placement of PICC as may want blood cultures to be negative 48 hours and were only drawn yesterday afternoon.  Will leave to their discretion.  We will continue to follow to assist with medical issues. Labs are stable.   LOS: 7 days   TISOVEC,RICHARD W 03/11/2013, 7:50 AM

## 2013-03-11 NOTE — Progress Notes (Signed)
SLP Cancellation Note  Patient Details Name: [name] MRN: [id number] DOB: Jul 21, 1933   Cancelled treatment:       Reason Eval/Treat Not Completed: ;Fatigue/lethargy limiting ability to participate. Per RN pt has been too lethargic for meals possibly due to pain meds. Pt will not benefit from intervention to train pt with chin tuck if lethargic. SLP will f/u tomorrow, hopeful for improved arousal.    Sara Keys, Riley Nearing 03/11/2013, 2:39 PM

## 2013-03-11 NOTE — Progress Notes (Signed)
Patient ID: [name], male   DOB: 1933-02-23, 79 y.o.   MRN: [id number] Patient is stable and as well at this juncture His bandages clean dry and intact Operative findings yesterday noted osteomyelitis about right middle finger We'll plan for IV antibiotics until the cultures are final I last therapy to see him today for whirlpool/WaterPik lavage followed by wet-to-dry dressing change. The area ulnarly based wound should fill in nicely with a wet-to-dry approach which can be taught with physical therapy/hydrotherapy. While in house was for daily therapy All questions encouraged and answered W Amyrie Illingworth M.D.

## 2013-03-11 NOTE — Progress Notes (Signed)
Physical Therapy Treatment Patient Details Name: [name] MRN: [id number] DOB: Oct 29, 1932 Today's Date: 03/11/2013 Time: 4098-1191 PT Time Calculation (min): 29 min  PT Assessment / Plan / Recommendation  History of Present Illness [name] is a 79 year old African American male with a history of diabetes mellitus with vascular, renal, neurologic, and ophthalmologic complications, coronary artery disease, and peripheral vascular disease status post bilateral BKA who presented emergency department with increased left-sided weakness x 36 hours. The patient has experienced a significant decline in functional status over the past 3-4 weeks. His wife initially called stating that he was having increasing difficulty standing and that she could no longer care for him at home.  She requested placement in a nursing facility. She did not feel that he was able to come into the office due to his weakness. We arranged home health social work, Charity fundraiser, and physical therapy which began last week to evaluate him and to assist with placement options.  Over the past week he has had 2 falls.  EMS was called for the first of these and evaluated him.  He was not brought to the ER since there was no apparent injury. 5 days ago he experienced a second fall out of his wheelchair. His wife thinks he may have hit his head with both of these but did not appear to have any injury.  He was able to work with physical therapy last week but continued to have generalized weakness. His wife also noticed slight increase in confusion and urinary incontinence. [name], his son has been lifting him into his motorized wheelchair noticed that he was very weak on his left side. This was confirmed by the physical therapist today who called our office. We recommended that he come the emergency department for evaluation where he was found to have a large chronic subdural hematoma on head CT. Chest x-ray also showed possible pneumonia. Family does  report these been coughing recently with increased cough with eating as well. Neurosurgery has evaluated him and recommends emergent evacuation. They requested medical admission due to his medical complexity and possible pneumonia   PT Comments   Pt was very fearful at EOB when trying to correct posture and alignment.  Not progress toward goals yet.   Follow Up Recommendations  SNF     Does the patient have the potential to tolerate intense rehabilitation     Barriers to Discharge        Equipment Recommendations   (TBA post acute)    Recommendations for Other Services    Frequency Min 2X/week   Progress towards PT Goals Progress towards PT goals: Not progressing toward goals - comment (too lethargic and then too fearful to participate therapeuti)  Plan Current plan remains appropriate    Precautions / Restrictions Precautions Precautions: Fall Required Braces or Orthoses:  (Bil prostheses) Restrictions Other Position/Activity Restrictions: Pt is B BKA. Does not use prostetics to walk.   Pertinent Vitals/Pain     Mobility  Bed Mobility Bed Mobility: Rolling Right;Right Sidelying to Sit;Sit to Supine Rolling Right: 2: Max assist Right Sidelying to Sit: 1: +2 Total assist Right Sidelying to Sit: Patient Percentage: 0% (to 10%) Sit to Supine: 1: +2 Total assist Sit to Supine: Patient Percentage: 0% Details for Bed Mobility Assistance: Again, pt fully asleep on arrival and difficult to arouse.  Somewhat resistant to sit up due to fear of being EOB. Transfers Transfers: Not assessed Ambulation/Gait Ambulation/Gait Assistance: Not tested (comment) Stairs: No    Exercises Other  Exercises Other Exercises: PROM LE's  Other Exercises: Bil prostheses donned to give pt a chance to contact floor bilaterally   PT Diagnosis:    PT Problem List:   PT Treatment Interventions:     PT Goals (current goals can now be found in the care plan section) Acute Rehab PT Goals Time For Goal  Achievement: 03/19/13 Potential to Achieve Goals: Fair  Visit Information  Last PT Received On: 03/11/13 Assistance Needed: +2 History of Present Illness: Mr. [name] is a 79 year old African American male with a history of diabetes mellitus with vascular, renal, neurologic, and ophthalmologic complications, coronary artery disease, and peripheral vascular disease status post bilateral BKA who presented emergency department with increased left-sided weakness x 36 hours. The patient has experienced a significant decline in functional status over the past 3-4 weeks. His wife initially called stating that he was having increasing difficulty standing and that she could no longer care for him at home.  She requested placement in a nursing facility. She did not feel that he was able to come into the office due to his weakness. We arranged home health social work, Charity fundraiser, and physical therapy which began last week to evaluate him and to assist with placement options.  Over the past week he has had 2 falls.  EMS was called for the first of these and evaluated him.  He was not brought to the ER since there was no apparent injury. 5 days ago he experienced a second fall out of his wheelchair. His wife thinks he may have hit his head with both of these but did not appear to have any injury.  He was able to work with physical therapy last week but continued to have generalized weakness. His wife also noticed slight increase in confusion and urinary incontinence. [name], his son has been lifting him into his motorized wheelchair noticed that he was very weak on his left side. This was confirmed by the physical therapist today who called our office. We recommended that he come the emergency department for evaluation where he was found to have a large chronic subdural hematoma on head CT. Chest x-ray also showed possible pneumonia. Family does report these been coughing recently with increased cough with eating as well.  Neurosurgery has evaluated him and recommends emergent evacuation. They requested medical admission due to his medical complexity and possible pneumonia    Subjective Data  Subjective: Uh-hu.Marland KitchenMarland KitchenMarland KitchenOhhhhhh.Marland KitchenMarland KitchenMarland KitchenUh-hu,,,,   Cognition  Cognition Arousal/Alertness: Lethargic Behavior During Therapy: Anxious Overall Cognitive Status: Impaired/Different from baseline Area of Impairment: Attention;Orientation;Following commands;Problem solving Following Commands: Follows one step commands inconsistently Problem Solving: Slow processing    Balance  Balance Balance Assessed: Yes Static Sitting Balance Static Sitting - Balance Support: Feet supported;Right upper extremity supported (Donned Bil prostheses to give pt a foundation to decr fear.) Static Sitting - Level of Assistance: 1: +2 Total assist;1: +1 Total assist Static Sitting - Comment/# of Minutes: Sat EOB approx 15 min working on postural control, upright  sitting.  Pt tended to grab and hold to rail agressively, tendency to list heavily R with head flexed and rotated right.  maximal cues and facilitation needed to get pt's trunk and head in midline.  At this point, pt almost in a panic to grab and pull himself back over to the rail on R.  End of Session PT - End of Session Activity Tolerance: Other (comment) (limited by fear and inability to tolerate midline.) Patient left: in bed;with call bell/phone within reach;with bed alarm  set Nurse Communication: Mobility status   GP     Marke Goodwyn, Eliseo Gum 03/11/2013, 4:24 PM 03/11/2013  [place] Bing, PT [phone]  (pager)

## 2013-03-11 NOTE — Consult Note (Signed)
[facility]    Date of Admission:  03/04/2013  Date of Consult:  03/11/2013  Reason for Consult:right finger infection Referring Physician: Dr. Wylene Simmer   HPI: [name] is an 79 y.o. male diabetes mellitus with vascular, renal, neurologic, and ophthalmologic complications, coronary artery disease, and peripheral vascular disease status post bilateral BKA who presented emergency department with increased left-sided weakness x 36 hours Found to have large subdural hematoma sp [name] hole craniotomy. In the interim he was started on abx for CAP with rocephin, azithromycin, then augmentin. He c/o right middle finger pain and was found to have osteomyelitis and he underwent:  1. Nail plate removal, right middle finger.  2. I and D, osteomyelitis, focus right middle finger with aggressive  bony debridement of the distal phalanx.  3. Debridement of skin, subcutaneous tissue, and nail bed tissue  including sterile matrix (the germinal matrix was not encroached  upon).  By Dr. Amanda [name] with intraoperative cultures having been obtained on abx. He is currently on vancomycin and zosyn.  Past Medical History  Diagnosis Date  . Diabetes mellitus   . Hypertension   . Testicular cancer dx'd 08/2010    surg only  . CHF (congestive heart failure)   . CAD (coronary artery disease)   . Peripheral vascular disease   . Vitamin B12 deficiency   . Diabetic retinopathy   . Diabetic peripheral neuropathy   . Cholelithiasis   . Family history of colon cancer     brother   . 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 Below knee leg amputation  2005    Right  . Below knee leg amputation  2006    Left  . Appendectomy    . Eye surgery  2012    Laser  . Ptca    . [name] hole Right 03/04/2013    Procedure: [name];  Surgeon: [name] Pacini, MD;  Location: [facility];  Service: Neurosurgery;  Laterality: Right;  Burr Holes   ergies:    No Known Allergies   Medications: I have reviewed patients current medications as documented in Epic Anti-infectives   Start     Dose/Rate Route Frequency Ordered Stop   03/10/13 1800  vancomycin (VANCOCIN) IVPB 1000 mg/200 mL premix     1,000 mg 200 mL/hr over 60 Minutes Intravenous Every 8 hours 03/10/13 1628     03/10/13 1700  piperacillin-tazobactam (ZOSYN) IVPB 3.375 g     3.375 g 12.5 mL/hr over 240 Minutes Intravenous 3 times per day 03/10/13 1628     03/06/13 2200  amoxicillin-clavulanate (AUGMENTIN) 875-125 MG per tablet 1 tablet  Status:  Discontinued     1 tablet Oral Every 12 hours 03/06/13 1800 03/10/13 1617   03/05/13 1700  azithromycin (ZITHROMAX) 500 mg in dextrose 5 % 250 mL IVPB  Status:  Discontinued     500 mg 250 mL/hr over 60 Minutes Intravenous Every 24 hours 03/04/13 2040 03/06/13 1800   03/05/13 1640  cefTRIAXone (ROCEPHIN) 1 g in dextrose 5 % 50 mL IVPB  Status:  Discontinued     1 g 100 mL/hr over 30 Minutes Intravenous Every 24 hours 03/04/13 2040 03/06/13 1800   03/04/13 1840  bacitracin 50,000 Units in sodium chloride irrigation 0.9 % 500 mL irrigation  Status:  Discontinued       As needed 03/04/13 1841 03/04/13 1845   03/04/13 1545  cefTRIAXone (ROCEPHIN) 1 g in  dextrose 5 % 50 mL IVPB     1 g 100 mL/hr over 30 Minutes Intravenous  Once 03/04/13 1531 03/04/13 1714   03/04/13 1545  azithromycin (ZITHROMAX) 500 mg in dextrose 5 % 250 mL IVPB     500 mg 250 mL/hr over 60 Minutes Intravenous  Once 03/04/13 1531 03/04/13 1813      Social History:  reports that he has quit smoking. He has never used smokeless tobacco. He reports that he does not drink alcohol or use illicit drugs.  Family History  Problem Relation Age of Onset  . Colon cancer Brother     As in HPI and primary teams notes otherwise 12 point review of systems is negative  Blood pressure 122/90, pulse 80, temperature 96.1 F (35.6 C), temperature source Axillary, resp. rate 20,  height 4\' 11"  (1.499 m), weight 229 lb 4.5 oz (104 kg), SpO2 9.00%. General: Very sleepy and difficult to arouse is able to answer some questions HEENT: Surgical site clean EOMI, CVS regular rate, normal r,  no murmur rubs or gallops Chest: clear to auscultation bilaterally, no wheezing, rales or rhonchi Abdomen: soft nontender, nondistended, normal bowel sounds, Extremities: Right middle finger bandaged he has a bilateral BKA sites are clean Neuro: mild left sided weakness   Results for orders placed during the hospital encounter of 03/04/13 (from the past 48 hour(s))  GLUCOSE, CAPILLARY     Status: Abnormal   Collection Time    03/09/13 12:37 PM      Result Value Range   Glucose-Capillary 181 (*) 70 - 99 mg/dL  C-REACTIVE PROTEIN     Status: Abnormal   Collection Time    03/09/13  2:47 PM      Result Value Range   CRP 4.2 (*) <0.60 mg/dL  SEDIMENTATION RATE     Status: Abnormal   Collection Time    03/09/13  2:47 PM      Result Value Range   Sed Rate 60 (*) 0 - 16 mm/hr  GLUCOSE, CAPILLARY     Status: Abnormal   Collection Time    03/09/13  4:32 PM      Result Value Range   Glucose-Capillary 152 (*) 70 - 99 mg/dL  GLUCOSE, CAPILLARY     Status: Abnormal   Collection Time    03/09/13  9:30 PM      Result Value Range   Glucose-Capillary 125 (*) 70 - 99 mg/dL   Comment 1 Documented in Chart     Comment 2 Notify RN    CBC     Status: Abnormal   Collection Time    03/10/13  5:15 AM      Result Value Range   WBC 9.6  4.0 - 10.5 K/uL   RBC 4.13 (*) 4.22 - 5.81 MIL/uL   Hemoglobin 13.6  13.0 - 17.0 g/dL   HCT 16.1 (*) 09.6 - 04.5 %   MCV 93.9  78.0 - 100.0 fL   MCH 32.9  26.0 - 34.0 pg   MCHC 35.1  30.0 - 36.0 g/dL   RDW 40.9  81.1 - 91.4 %   Platelets 292  150 - 400 K/uL  BASIC METABOLIC PANEL     Status: Abnormal   Collection Time    03/10/13  5:15 AM      Result Value Range   Sodium 133 (*) 135 - 145 mEq/L   Potassium 4.6  3.5 - 5.1 mEq/L   Chloride 100  96 - 112  mEq/L   CO2 24  19 - 32 mEq/L   Glucose, Bld 142 (*) 70 - 99 mg/dL   BUN 12  6 - 23 mg/dL   Creatinine, Ser 4.09  0.50 - 1.35 mg/dL   Calcium 8.8  8.4 - 81.1 mg/dL   GFR calc non Af Amer 82 (*) >90 mL/min   GFR calc Af Amer >90  >90 mL/min   Comment:            The eGFR has been calculated     using the CKD EPI equation.     This calculation has not been     validated in all clinical     situations.     eGFR's persistently     <90 mL/min signify     possible Chronic Kidney Disease.  GLUCOSE, CAPILLARY     Status: Abnormal   Collection Time    03/10/13  7:20 AM      Result Value Range   Glucose-Capillary 134 (*) 70 - 99 mg/dL   Comment 1 Notify RN     Comment 2 Documented in Chart    GLUCOSE, CAPILLARY     Status: Abnormal   Collection Time    03/10/13 11:49 AM      Result Value Range   Glucose-Capillary 163 (*) 70 - 99 mg/dL   Comment 1 Documented in Chart     Comment 2 Notify RN    GLUCOSE, CAPILLARY     Status: Abnormal   Collection Time    03/10/13  4:47 PM      Result Value Range   Glucose-Capillary 400 (*) 70 - 99 mg/dL   Comment 1 Documented in Chart     Comment 2 Notify RN    CULTURE, BLOOD (SINGLE)     Status: None   Collection Time    03/10/13  4:53 PM      Result Value Range   Specimen Description BLOOD LEFT ARM     Special Requests BOTTLES DRAWN AEROBIC ONLY 2CC     Culture  Setup Time 03/10/2013 21:45     Culture       Value:        BLOOD CULTURE RECEIVED NO GROWTH TO DATE CULTURE WILL BE HELD FOR 5 DAYS BEFORE ISSUING A FINAL NEGATIVE REPORT   Report Status PENDING    WOUND CULTURE     Status: None   Collection Time    03/10/13  6:38 PM      Result Value Range   Specimen Description WOUND     Special Requests RIGHT MIDDLE FINGER UNDER NAIL     Gram Stain PENDING     Culture Culture reincubated for better growth     Report Status PENDING    TISSUE CULTURE     Status: None   Collection Time    03/10/13  6:40 PM      Result Value Range   Specimen  Description TISSUE     Special Requests RIGHT MIDDLE FINGER UNDER NAIL     Gram Stain       Value: NO WBC SEEN     NO ORGANISMS SEEN   Culture NO GROWTH     Report Status PENDING    GLUCOSE, CAPILLARY     Status: None   Collection Time    03/10/13 10:31 PM      Result Value Range   Glucose-Capillary 80  70 - 99 mg/dL   Comment 1 Notify RN  Comment 2 Documented in Chart    CBC     Status: Abnormal   Collection Time    03/11/13  6:05 AM      Result Value Range   WBC 10.6 (*) 4.0 - 10.5 K/uL   RBC 4.15 (*) 4.22 - 5.81 MIL/uL   Hemoglobin 13.6  13.0 - 17.0 g/dL   HCT 69.6 (*) 29.5 - 28.4 %   MCV 93.5  78.0 - 100.0 fL   MCH 32.8  26.0 - 34.0 pg   MCHC 35.1  30.0 - 36.0 g/dL   RDW 13.2  44.0 - 10.2 %   Platelets 321  150 - 400 K/uL  BASIC METABOLIC PANEL     Status: Abnormal   Collection Time    03/11/13  6:05 AM      Result Value Range   Sodium 131 (*) 135 - 145 mEq/L   Potassium 4.3  3.5 - 5.1 mEq/L   Chloride 98  96 - 112 mEq/L   CO2 20  19 - 32 mEq/L   Glucose, Bld 147 (*) 70 - 99 mg/dL   BUN 14  6 - 23 mg/dL   Creatinine, Ser 7.25  0.50 - 1.35 mg/dL   Calcium 8.9  8.4 - 36.6 mg/dL   GFR calc non Af Amer 79 (*) >90 mL/min   GFR calc Af Amer >90  >90 mL/min   Comment:            The eGFR has been calculated     using the CKD EPI equation.     This calculation has not been     validated in all clinical     situations.     eGFR's persistently     <90 mL/min signify     possible Chronic Kidney Disease.  GLUCOSE, CAPILLARY     Status: Abnormal   Collection Time    03/11/13  6:40 AM      Result Value Range   Glucose-Capillary 140 (*) 70 - 99 mg/dL   Comment 1 Documented in Chart     Comment 2 Notify RN        Component Value Date/Time   SDES TISSUE 03/10/2013 1840   SPECREQUEST RIGHT MIDDLE FINGER UNDER NAIL 03/10/2013 1840   CULT NO GROWTH 03/10/2013 1840   REPTSTATUS PENDING 03/10/2013 1840   Mr Hand Right Wo Contrast  03/10/2013   *RADIOLOGY REPORT*  Clinical  Data: Third digit distal phalanx pain, swelling and abnormal radiographs.  MRI OF THE RIGHT HAND WITHOUT CONTRAST  Technique:  Multiplanar, multisequence MR imaging was performed. No intravenous contrast was administered.  Comparison: Radiographs 03/09/2013  Findings: The distal phalanx of the third digit demonstrates diffuse marrow edema along with dorsal cortical thickening and a somewhat irregular wavy appearance.  There is also mild diffuse soft tissue swelling/edema and thickening of the nail bed.  No discrete soft tissue abscess.  Findings are suspicious for osteomyelitis and dorsal periosteal reaction.  No findings to suggest septic arthritis or septic tenosynovitis. The remaining bony structures are intact and the joints are intact. There is diffuse fatty atrophy of the hand musculature.  IMPRESSION: MR findings suspicious for osteomyelitis involving the distal phalanx of the third digit.   Original Report Authenticated By: Rudie Meyer, M.D.     Recent Results (from the past 720 hour(s))  MRSA PCR SCREENING     Status: None   Collection Time    03/04/13  9:01 PM      Result  Value Range Status   MRSA by PCR NEGATIVE  NEGATIVE Final   Comment:            The GeneXpert MRSA Assay (FDA     approved for NASAL specimens     only), is one component of a     comprehensive MRSA colonization     surveillance program. It is not     intended to diagnose MRSA     infection nor to guide or     monitor treatment for     MRSA infections.  CULTURE, BLOOD (SINGLE)     Status: None   Collection Time    03/10/13  4:53 PM      Result Value Range Status   Specimen Description BLOOD LEFT ARM   Final   Special Requests BOTTLES DRAWN AEROBIC ONLY 2CC   Final   Culture  Setup Time 03/10/2013 21:45   Final   Culture     Final   Value:        BLOOD CULTURE RECEIVED NO GROWTH TO DATE CULTURE WILL BE HELD FOR 5 DAYS BEFORE ISSUING A FINAL NEGATIVE REPORT   Report Status PENDING   Incomplete  WOUND CULTURE      Status: None   Collection Time    03/10/13  6:38 PM      Result Value Range Status   Specimen Description WOUND   Final   Special Requests RIGHT MIDDLE FINGER UNDER NAIL   Final   Gram Stain PENDING   Incomplete   Culture Culture reincubated for better growth   Final   Report Status PENDING   Incomplete  TISSUE CULTURE     Status: None   Collection Time    03/10/13  6:40 PM      Result Value Range Status   Specimen Description TISSUE   Final   Special Requests RIGHT MIDDLE FINGER UNDER NAIL   Final   Gram Stain     Final   Value: NO WBC SEEN     NO ORGANISMS SEEN   Culture NO GROWTH   Final   Report Status PENDING   Incomplete     Impression/Recommendation  79 year old man with left-sided weakness due to apparent subdural hematoma status post bur hole craniotomy now found to have osteomyelitis involving his finger with hardware that status post I&D and removal of hardware. He also has questionable pneumonia  #1 Osteomyelitis of finger sp I and D and removal of hardware: --Agree with broad spectrum abx, but will change to vancomycin and cefepime for now  #2 Subdural hematoma sp evacuation; Described as blood but on the chance this could have been infected will make sure abx are ones with good CNS penetration  #3 ?CAP: VERy broadly covered  #4 Screening; check HIV, hep c   Thank you so much for this interesting consult  [facility] [phone] (pager) [phone] (office) 03/11/2013, 12:18 PM  Paulette Blanch Dam 03/11/2013, 12:18 PM

## 2013-03-11 NOTE — Progress Notes (Signed)
ANTIBIOTIC CONSULT NOTE - INITIAL  Pharmacy Consult for cefepime Indication: L finger osteomyelitis, posible CNS infection  No Known Allergies  Patient Measurements: Height: 4\' 11"  (149.9 cm) Weight: 229 lb 4.5 oz (104 kg) IBW/kg (Calculated) : 47.7  Vital Signs: Temp: 96.1 F (35.6 C) (08/04 1010) Temp src: Axillary (08/04 1010) BP: 122/90 mmHg (08/04 1010) Pulse Rate: 80 (08/04 1010) Intake/Output from previous day: 08/03 0701 - 08/04 0700 In: 600 [P.O.:600] Out: -  Intake/Output from this shift:    Labs:  Recent Labs  03/09/13 0415 03/10/13 0515 03/11/13 0605  WBC 9.7 9.6 10.6*  HGB 13.8 13.6 13.6  PLT 291 292 321  CREATININE 0.82 0.86 0.94   Estimated Creatinine Clearance: 65.3 ml/min (by C-G formula based on Cr of 0.94). No results found for this basename: VANCOTROUGH, Leodis Binet, VANCORANDOM, GENTTROUGH, GENTPEAK, GENTRANDOM, TOBRATROUGH, TOBRAPEAK, TOBRARND, AMIKACINPEAK, AMIKACINTROU, AMIKACIN,  in the last 72 hours   Microbiology: Recent Results (from the past 720 hour(s))  MRSA PCR SCREENING     Status: None   Collection Time    03/04/13  9:01 PM      Result Value Range Status   MRSA by PCR NEGATIVE  NEGATIVE Final   Comment:            The GeneXpert MRSA Assay (FDA     approved for NASAL specimens     only), is one component of a     comprehensive MRSA colonization     surveillance program. It is not     intended to diagnose MRSA     infection nor to guide or     monitor treatment for     MRSA infections.  CULTURE, BLOOD (SINGLE)     Status: None   Collection Time    03/10/13  4:53 PM      Result Value Range Status   Specimen Description BLOOD LEFT ARM   Final   Special Requests BOTTLES DRAWN AEROBIC ONLY 2CC   Final   Culture  Setup Time 03/10/2013 21:45   Final   Culture     Final   Value:        BLOOD CULTURE RECEIVED NO GROWTH TO DATE CULTURE WILL BE HELD FOR 5 DAYS BEFORE ISSUING A FINAL NEGATIVE REPORT   Report Status PENDING    Incomplete  WOUND CULTURE     Status: None   Collection Time    03/10/13  6:38 PM      Result Value Range Status   Specimen Description WOUND   Final   Special Requests RIGHT MIDDLE FINGER UNDER NAIL   Final   Gram Stain PENDING   Incomplete   Culture Culture reincubated for better growth   Final   Report Status PENDING   Incomplete  ANAEROBIC CULTURE     Status: None   Collection Time    03/10/13  6:38 PM      Result Value Range Status   Specimen Description WOUND   Final   Special Requests RIGHT MIDDLE FINGER UNDER NAIL   Final   Gram Stain PENDING   Incomplete   Culture     Final   Value: NO ANAEROBES ISOLATED; CULTURE IN PROGRESS FOR 5 DAYS   Report Status PENDING   Incomplete  TISSUE CULTURE     Status: None   Collection Time    03/10/13  6:40 PM      Result Value Range Status   Specimen Description TISSUE   Final   Special  Requests RIGHT MIDDLE FINGER UNDER NAIL   Final   Gram Stain     Final   Value: NO WBC SEEN     NO ORGANISMS SEEN   Culture NO GROWTH   Final   Report Status PENDING   Incomplete    Medical History: Past Medical History  Diagnosis Date  . Diabetes mellitus   . Hypertension   . Testicular cancer dx'd 08/2010    surg only  . CHF (congestive heart failure)   . CAD (coronary artery disease)   . Peripheral vascular disease   . Vitamin B12 deficiency   . Diabetic retinopathy   . Diabetic peripheral neuropathy   . Cholelithiasis   . Family history of colon cancer     brother   . 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 He is also s/p I&D of right middle finger for osteomyelitis. Pharmacy consulted to begin cefepime, currently also on vancomycin. He is afebrile, WBC are slightly elevated, and culture data is neg thus far.  Azith 7/28>>7/30 Ceftriaxone 7/28>>7/30 Vanc 8/3>> Zosyn 8/3>>8/4 Cefepime 8/4>>  8/3  BCx1 -  ngtd 8/3 R finger wound - reincubated 8/3 R finger tissue - ngtd MRSA PCR neg  Goal of Therapy:  Eradication of infection  Plan:  -Cefepime 2 g IV q8h -Monitor renal function, clinical progress, and culture data  [facility], Pharm.D., BCPS Clinical Pharmacist Pager: [phone] 03/11/2013 1:02 PM

## 2013-03-12 LAB — CBC
HCT: 38.4 % — ABNORMAL LOW (ref 39.0–52.0)
Platelets: 255 10*3/uL (ref 150–400)
RDW: 13.9 % (ref 11.5–15.5)
WBC: 10.9 10*3/uL — ABNORMAL HIGH (ref 4.0–10.5)

## 2013-03-12 LAB — BASIC METABOLIC PANEL
Chloride: 99 mEq/L (ref 96–112)
GFR calc Af Amer: >90 mL/min (ref >90)
Potassium: 4.5 mEq/L (ref 3.5–5.1)

## 2013-03-12 LAB — HEPATITIS C ANTIBODY (REFLEX): HCV Ab: NEGATIVE

## 2013-03-12 LAB — GLUCOSE, CAPILLARY: Glucose-Capillary: 293 mg/dL — ABNORMAL HIGH (ref 70–99)

## 2013-03-12 MED ORDER — SODIUM CHLORIDE 0.9 % IJ SOLN
10.0000 mL | INTRAMUSCULAR | Status: DC | PRN
  Administered 2013-03-20: 10 mL

## 2013-03-12 NOTE — Progress Notes (Signed)
Subjective: The patient is currently eating lunch, he has no complaints of pain about the finger.  Objective: Vital signs in last 24 hours: Temp:  [97.6 F (36.4 C)-98.7 F (37.1 C)] 98.5 F (36.9 C) (08/05 1001) Pulse Rate:  [78-96] 81 (08/05 1001) Resp:  [16-20] 18 (08/05 1001) BP: (99-132)/(49-58) 120/58 mmHg (08/05 1001) SpO2:  [91 %-99 %] 91 % (08/05 1001)  Intake/Output from previous day: 08/04 0701 - 08/05 0700 In: 200 [P.O.:200] Out: 250 [Urine:250] Intake/Output this shift: Total I/O In: 60 [P.O.:60] Out: -    Recent Labs  03/10/13 0515 03/11/13 0605 03/12/13 0915  HGB 13.6 13.6 13.4    Recent Labs  03/11/13 0605 03/12/13 0915  WBC 10.6* 10.9*  RBC 4.15* 4.12*  HCT 38.8* 38.4*  PLT 321 255    Recent Labs  03/11/13 0605 03/12/13 0915  NA 131* 132*  K 4.3 4.5  CL 98 99  CO2 20 23  BUN 14 16  CREATININE 0.94 0.96  GLUCOSE 147* 184*  CALCIUM 8.9 8.9   No results found for this basename: LABPT, INR,  in the last 72 hours  Focused examination of the right upper extremity shows that once the dressings are removed to the finger. Is no signs of purulence present. Nailbed without signs of infection or significant debris or drainage. The I&D site is patent and clean a should surrounding soft tissues are improved without significant swelling refill and sensation are intact. Wet-to-dry dressings are applied to the finger as well as a soft wrap  Assessment/Plan: We will continue to monitor the patient he will need daily wound care in the form of wet-to-dry dressings. We are awaiting his final cultures. Currently he does show signs of gram negative rods, cultures are pending. We will await final cultures as well as infectious disease recommendations for the appropriate antibiotic regime. Patient will need continued daily wound care to the finger.   Paidyn Mcferran L 03/12/2013, 1:29 PM

## 2013-03-12 NOTE — Progress Notes (Signed)
UR COMPLETED  

## 2013-03-12 NOTE — Progress Notes (Signed)
Subjective: Doing ok, simple gauze dressing on his finger.  Objective: Vital signs in last 24 hours: Temp:  [96.1 F (35.6 C)-98.7 F (37.1 C)] 97.6 F (36.4 C) (08/05 0200) Pulse Rate:  [78-96] 78 (08/05 0200) Resp:  [18-20] 20 (08/05 0200) BP: (99-132)/(49-90) 99/49 mmHg (08/05 0200) SpO2:  [94 %-99 %] 94 % (08/05 0200) Weight change:  Last BM Date: 03/10/13  Intake/Output from previous day: 08/04 0701 - 08/05 0700 In: 200 [P.O.:200] Out: 250 [Urine:250] Intake/Output this shift:   General appearance: alert and no distress  Scalp steri strips R side  Eyes: no scleral icterus  Throat: oropharynx moist without erythema  Resp: clear to auscultation bilaterally  Cardio: regular rate and rhythm  GI: soft, non-tender; bowel sounds normal; no masses, no organomegaly  Extremities: no clubbing, cyanosis or edema. B BKA. Has legs in room.  R Middle finger - Now dressed postop from Sunday with simple gauze wrap  Lab Results:  Recent Labs  03/10/13 0515 03/11/13 0605  WBC 9.6 10.6*  HGB 13.6 13.6  HCT 38.8* 38.8*  PLT 292 321   BMET  Recent Labs  03/10/13 0515 03/11/13 0605  NA 133* 131*  K 4.6 4.3  CL 100 98  CO2 24 20  GLUCOSE 142* 147*  BUN 12 14  CREATININE 0.86 0.94  CALCIUM 8.8 8.9    Studies/Results: Mr Hand Right Wo Contrast  03/10/2013   *RADIOLOGY REPORT*  Clinical Data: Third digit distal phalanx pain, swelling and abnormal radiographs.  MRI OF THE RIGHT HAND WITHOUT CONTRAST  Technique:  Multiplanar, multisequence MR imaging was performed. No intravenous contrast was administered.  Comparison: Radiographs 03/09/2013  Findings: The distal phalanx of the third digit demonstrates diffuse marrow edema along with dorsal cortical thickening and a somewhat irregular wavy appearance.  There is also mild diffuse soft tissue swelling/edema and thickening of the nail bed.  No discrete soft tissue abscess.  Findings are suspicious for osteomyelitis and dorsal  periosteal reaction.  No findings to suggest septic arthritis or septic tenosynovitis. The remaining bony structures are intact and the joints are intact. There is diffuse fatty atrophy of the hand musculature.  IMPRESSION: MR findings suspicious for osteomyelitis involving the distal phalanx of the third digit.   Original Report Authenticated By: Rudie Meyer, M.D.    Medications:  I have reviewed the patient's current medications. Scheduled: . ceFEPime (MAXIPIME) IV  2 g Intravenous Q8H  . ezetimibe  10 mg Oral Daily  . insulin aspart  0-15 Units Subcutaneous TID WC  . insulin aspart  0-5 Units Subcutaneous QHS  . insulin aspart  4 Units Subcutaneous TID WC  . insulin glargine  11 Units Subcutaneous QAC breakfast  . levETIRAcetam  500 mg Oral BID  . pantoprazole  40 mg Oral Q1200  . vancomycin  1,000 mg Intravenous Q8H   Continuous:  NFA:[id number], acetaminophen, HYDROcodone-acetaminophen, HYDROmorphone (DILAUDID) injection, morphine injection, ondansetron (ZOFRAN) IV, ondansetron, promethazine  Assessment/Plan: Principal Problem:  1.Chronic subdural hematoma- improved mental status. Left sided weakness has resolved. Post-op management per Neurosurgery. Postop day #8: The patient is on the floor on the Neurosurgical team.  Active Problems:  2. Community acquired pneumonia-completed course 3. DIABETES MELLITUS- continue Lantus and SSI qAC- CBGs.  4. HYPERTENSION- BP stable off medications  5. CAD- resume medical therapy when BP will tolerate. Neurosurgery to decide if ever a candidate for resuming Plavix given his bleed, would favor holding off.  6. CHF- appears euvolemic.  7. PVD s/p BKA- ASA,  Plavix on hold due to #1.  8. Disposition- anticipate discharge to SNF when stable per NSU. Continue PT/OT/ST.  9. Finger pain- Osteo, has had operative debridement with wound instructions per Gramig.Having trouble with hydrotherapy which Ortho will need to address per sticky note request.   i have ordered PICC placement as he is 48 hours post BC and initial abx.  ID has seen and changed to Vanc + Cefepime.  They will need to address final decision on abx and time course for discharge to SNF.  He is otherwise medically stable except for the ID/Ortho decision making and disposition to SNF is pending.  We will continue to follow to assist with medical issues as needed.   LOS: 8 days   Zakeya Junker W 03/12/2013, 7:44 AM

## 2013-03-12 NOTE — Progress Notes (Signed)
[facility]  Day #2 cefepime Day # 3 vancomycin  Subjective: No new complaints   Antibiotics:  Anti-infectives   Start     Dose/Rate Route Frequency Ordered Stop   03/11/13 1400  ceFEPIme (MAXIPIME) 2 g in dextrose 5 % 50 mL IVPB     2 g 100 mL/hr over 30 Minutes Intravenous 3 times per day 03/11/13 1302     03/10/13 1800  vancomycin (VANCOCIN) IVPB 1000 mg/200 mL premix  Status:  Discontinued     1,000 mg 200 mL/hr over 60 Minutes Intravenous Every 8 hours 03/10/13 1628 03/12/13 1127   03/10/13 1700  piperacillin-tazobactam (ZOSYN) IVPB 3.375 g  Status:  Discontinued     3.375 g 12.5 mL/hr over 240 Minutes Intravenous 3 times per day 03/10/13 1628 03/11/13 1236   03/06/13 2200  amoxicillin-clavulanate (AUGMENTIN) 875-125 MG per tablet 1 tablet  Status:  Discontinued     1 tablet Oral Every 12 hours 03/06/13 1800 03/10/13 1617   03/05/13 1700  azithromycin (ZITHROMAX) 500 mg in dextrose 5 % 250 mL IVPB  Status:  Discontinued     500 mg 250 mL/hr over 60 Minutes Intravenous Every 24 hours 03/04/13 2040 03/06/13 1800   03/05/13 1640  cefTRIAXone (ROCEPHIN) 1 g in dextrose 5 % 50 mL IVPB  Status:  Discontinued     1 g 100 mL/hr over 30 Minutes Intravenous Every 24 hours 03/04/13 2040 03/06/13 1800   03/04/13 1840  bacitracin 50,000 Units in sodium chloride irrigation 0.9 % 500 mL irrigation  Status:  Discontinued       As needed 03/04/13 1841 03/04/13 1845   03/04/13 1545  cefTRIAXone (ROCEPHIN) 1 g in dextrose 5 % 50 mL IVPB     1 g 100 mL/hr over 30 Minutes Intravenous  Once 03/04/13 1531 03/04/13 1714   03/04/13 1545  azithromycin (ZITHROMAX) 500 mg in dextrose 5 % 250 mL IVPB     500 mg 250 mL/hr over 60 Minutes Intravenous  Once 03/04/13 1531 03/04/13 1813      Medications: Scheduled Meds: . ceFEPime (MAXIPIME) IV  2 g Intravenous Q8H  . ezetimibe  10 mg Oral Daily  . insulin aspart  0-15 Units Subcutaneous TID WC  . insulin aspart  0-5  Units Subcutaneous QHS  . insulin aspart  4 Units Subcutaneous TID WC  . insulin glargine  11 Units Subcutaneous QAC breakfast  . levETIRAcetam  500 mg Oral BID  . pantoprazole  40 mg Oral Q1200   Continuous Infusions:  PRN Meds:.acetaminophen, acetaminophen, HYDROcodone-acetaminophen, HYDROmorphone (DILAUDID) injection, morphine injection, ondansetron (ZOFRAN) IV, ondansetron, promethazine, sodium chloride   Objective: Weight change:   Intake/Output Summary (Last 24 hours) at 03/12/13 1758 Last data filed at 03/12/13 1500  Gross per 24 hour  Intake    400 ml  Output    250 ml  Net    150 ml   Blood pressure 103/48, pulse 78, temperature 98.1 F (36.7 C), temperature source Oral, resp. rate 18, height 4\' 11"  (1.499 m), weight 229 lb 4.5 oz (104 kg), SpO2 100.00%. Temp:  [97.6 F (36.4 C)-98.7 F (37.1 C)] 98.1 F (36.7 C) (08/05 1400) Pulse Rate:  [78-92] 78 (08/05 1400) Resp:  [16-20] 18 (08/05 1400) BP: (99-130)/(48-58) 103/48 mmHg (08/05 1400) SpO2:  [91 %-100 %] 100 % (08/05 1400)  Physical Exam: General: more awake oriented to person, place HEENT: Surgical site clean EOMI, CVS regular rate, normal r, no murmur rubs or  gallops  Chest: clear to auscultation bilaterally, no wheezing, rales or rhonchi  Abdomen: soft nontender, nondistended, normal bowel sounds,  Extremities: Right middle finger bandaged he has a bilateral BKA sites are clean  Neuro: mild left sided weakness  Lymph: no new lymphadenopathy Neuro: nonfocal  Lab Results:  Recent Labs  03/11/13 0605 03/12/13 0915  WBC 10.6* 10.9*  HGB 13.6 13.4  HCT 38.8* 38.4*  PLT 321 255    BMET  Recent Labs  03/11/13 0605 03/12/13 0915  NA 131* 132*  K 4.3 4.5  CL 98 99  CO2 20 23  GLUCOSE 147* 184*  BUN 14 16  CREATININE 0.94 0.96  CALCIUM 8.9 8.9    Micro Results: Recent Results (from the past 240 hour(s))  MRSA PCR SCREENING     Status: None   Collection Time    03/04/13  9:01 PM       Result Value Range Status   MRSA by PCR NEGATIVE  NEGATIVE Final   Comment:            The GeneXpert MRSA Assay (FDA     approved for NASAL specimens     only), is one component of a     comprehensive MRSA colonization     surveillance program. It is not     intended to diagnose MRSA     infection nor to guide or     monitor treatment for     MRSA infections.  CULTURE, BLOOD (SINGLE)     Status: None   Collection Time    03/10/13  4:53 PM      Result Value Range Status   Specimen Description BLOOD LEFT ARM   Final   Special Requests BOTTLES DRAWN AEROBIC ONLY 2CC   Final   Culture  Setup Time 03/10/2013 21:45   Final   Culture     Final   Value:        BLOOD CULTURE RECEIVED NO GROWTH TO DATE CULTURE WILL BE HELD FOR 5 DAYS BEFORE ISSUING A FINAL NEGATIVE REPORT   Report Status PENDING   Incomplete  WOUND CULTURE     Status: None   Collection Time    03/10/13  6:38 PM      Result Value Range Status   Specimen Description WOUND   Final   Special Requests RIGHT MIDDLE FINGER UNDER NAIL   Final   Gram Stain     Final   Value: RARE WBC PRESENT, PREDOMINANTLY PMN     NO SQUAMOUS EPITHELIAL CELLS SEEN     NO ORGANISMS SEEN     Performed at [facility]   Culture     Final   Value: FEW GRAM NEGATIVE RODS     Performed at [facility]   Report Status PENDING   Incomplete  ANAEROBIC CULTURE     Status: None   Collection Time    03/10/13  6:38 PM      Result Value Range Status   Specimen Description WOUND   Final   Special Requests RIGHT MIDDLE FINGER UNDER NAIL   Final   Gram Stain     Final   Value: RARE WBC PRESENT, PREDOMINANTLY PMN     NO SQUAMOUS EPITHELIAL CELLS SEEN     NO ORGANISMS SEEN     Performed at [facility]   Culture     Final   Value: NO ANAEROBES ISOLATED; CULTURE IN PROGRESS FOR 5 DAYS  Performed at [facility]   Report Status PENDING   Incomplete  TISSUE CULTURE     Status: None   Collection Time    03/10/13  6:40  PM      Result Value Range Status   Specimen Description TISSUE   Final   Special Requests RIGHT MIDDLE FINGER UNDER NAIL   Final   Gram Stain     Final   Value: NO WBC SEEN     NO ORGANISMS SEEN     Performed at [facility]   Culture     Final   Value: RARE GRAM NEGATIVE RODS     Performed at [facility]   Report Status PENDING   Incomplete    Studies/Results: No results found.    Assessment/Plan: [name] is a 79 y.o. male with  with left-sided weakness due to apparent subdural hematoma status post bur hole craniotomy now found to have osteomyelitis involving his finger with hardware that status post I&D and removal of hardware. He also has questionable pneumonia   #1 Osteomyelitis of finger sp I and D and removal of hardware: GNR isolated on culture --DC vancomycin --conitnue cefepime   #2 Subdural hematoma sp evacuation; Described as blood but on the chance this could have been infected will make sure abx are ones with good CNS penetration   #3 ?CAP: Cefepime fine for this  #4 Screening; check HIV, hep c    LOS: 8 days   Acey Lav 03/12/2013, 5:58 PM

## 2013-03-12 NOTE — Progress Notes (Signed)
Speech Language Pathology Dysphagia Treatment Patient Details Name: [name] MRN: [id number] DOB: 1933-05-11 Today's Date: 03/12/2013 Time: 8657-8469 SLP Time Calculation (min): 14 min  Assessment / Plan / Recommendation Clinical Impression  Treatment focused on reinforcing chin tuck strategy with pt and family. Wife at bedside. Pt was not able to verbalize need to put chin down, needed moderate verbal cues to recall and implement chin tuck. Wife reports that family has been cueing him with meals. Pt return demonstrated over 6 oz of juice, verbalized understanding of impact of aspiration with moderate verbal cues at end of session. Subtle signs of aspiration persist. Pts aspiration risk is moderate even with precautions.     Diet Recommendation  Continue with Current Diet: Thin liquid;Regular    SLP Plan Continue with current plan of care   Pertinent Vitals/Pain NA   Swallowing Goals  SLP Swallowing Goals Patient will utilize recommended strategies during swallow to increase swallowing safety with: Minimal cueing;Minimal assistance Swallow Study Goal #2 - Progress: Progressing toward goal  General Temperature Spikes Noted: No Respiratory Status: Room air Behavior/Cognition: Alert;Cooperative Oral Cavity - Dentition: Dentures, top;Dentures, bottom Patient Positioning: Postural control interferes with function  Oral Cavity - Oral Hygiene Does patient have any of the following "at risk" factors?: Other - dysphagia Patient is AT RISK - Oral Care Protocol followed (see row info): Yes   Dysphagia Treatment Treatment focused on: Skilled observation of diet tolerance;Patient/family/caregiver education;Utilization of compensatory strategies Family/Caregiver Educated: wife Treatment Methods/Modalities: Skilled observation;Differential diagnosis Patient observed directly with PO's: Yes Type of PO's observed: Thin liquids Feeding: Able to feed self;Needs assist Liquids provided via:  Straw Pharyngeal Phase Signs & Symptoms: Wet vocal quality;Immediate cough Type of cueing: Verbal Amount of cueing: Minimal   GO    Harlon Ditty, MA CCC-SLP [phone]  [name] 03/12/2013, 2:23 PM

## 2013-03-12 NOTE — Progress Notes (Signed)
Physical Therapy Wound Treatment Patient Details  Name: Quin Mathenia MRN: [id number] Date of Birth: 1932-09-27  Today's Date: 03/12/2013 Time: 4098-1191 Time Calculation (min): 18 min  Subjective  Subjective: Pt reported he did not recall doing hydrotherapy to his finger 8/4 Patient and Family Stated Goals: agrees he wants finger to get well Date of Onset: 04/05/13 Prior Treatments: nail removed with bedside I&D by ortho  Pain Score:  Pt unable to rate due to confusion; does withdraw Rt UE abruptly when treatment was painful  Wound Assessment  Clinical Statement: Spoke with Dr. Amanda Pea via phone this a.m. and he felt strongly we needed to try hydrotherapy again today and did not want any type of non-adherent dressing over wound bed. Pt more alert today and better able to follow commands to relax his arm and hold still during treatment. However, he did withdraw/jump in response to pain at wound site several times during session, despite technician's attempt to restrain his Rt UE. Wound bed very clean with pink granulation tissue (not yet beefy red).  Wound 03/11/13 Rt 3rd finger--distal (Active)  Site / Wound Assessment Granulation tissue;Painful;Pale;Pink;Yellow 03/12/2013 11:46 AM  % Wound base Red or Granulating 95% 03/12/2013 11:46 AM  % Wound base Yellow 0% 03/12/2013 11:46 AM  % Wound base Black 5% 03/12/2013 11:46 AM  % Wound base Other (Comment) 0% 03/12/2013 11:46 AM  Peri-wound Assessment Maceration 03/12/2013 11:46 AM  Wound Length (cm) 2 cm 03/11/2013 10:49 AM  Wound Width (cm) 1.7 cm 03/11/2013 10:49 AM  Wound Depth (cm) 0.3 cm 03/11/2013 10:49 AM  Margins Unattacted edges (unapproximated) 03/12/2013 11:46 AM  Closure None 03/12/2013 11:46 AM  Drainage Amount Minimal 03/12/2013 11:46 AM  Drainage Description Serosanguineous;No odor 03/12/2013 11:46 AM  Non-staged Wound Description Partial thickness 03/12/2013 11:46 AM  Treatment Hydrotherapy (Pulse lavage) 03/12/2013 11:46 AM  Dressing Type Gauze  (Comment) 03/12/2013 11:46 AM  Dressing Changed Changed 03/12/2013 11:46 AM  Dressing Status Clean;Dry;Intact 03/12/2013 11:46 AM      Hydrotherapy Pulsed lavage therapy - wound location: Rt 3rd finger Pulsed Lavage with Suction (psi): 4 psi Pulsed Lavage with Suction - Normal Saline Used: Other (comment) (250) Pulsed Lavage Tip: Tip with splash shield   Wound Assessment and Plan  Wound Therapy - Assess/Plan/Recommendations Wound Therapy - Clinical Statement: Spoke with Dr. Amanda Pea via phone this a.m. and he felt strongly we needed to try hydrotherapy again today and did not want any type of non-adherent dressing over wound bed. Pt more alert today and better able to follow commands to relax his arm and hold still during treatment. However he did withdraw/jump in response to pain at wound site several times during session, despite technician's attempt to restrain his Rt UE.  Wound Therapy - Functional Problem List: limited use of Rt hand for funtional tasks Factors Delaying/Impairing Wound Healing: Diabetes Mellitus;Infection - systemic/local;Multiple medical problems Hydrotherapy Plan: Dressing change;Debridement;Patient/family education;Pulsatile lavage with suction Wound Therapy - Frequency: 6X / week Wound Therapy - Follow Up Recommendations: Skilled nursing facility  Wound Therapy Goals- Improve the function of patient's integumentary system by progressing the wound(s) through the phases of wound healing (inflammation - proliferation - remodeling) by: Decrease Necrotic Tissue to: <15 Decrease Necrotic Tissue - Progress: Progressing toward goal Increase Granulation Tissue to: >85 Increase Granulation Tissue - Progress: Progressing toward goal Decrease Length/Width/Depth by (cm): 0.2 Decrease Length/Width/Depth - Progress: Progressing toward goal Improve Drainage Characteristics: Min;Serous Improve Drainage Characteristics - Progress: Progressing toward goal  Goals will be updated until  maximal potential achieved or discharge criteria met.  Discharge criteria: when goals achieved, discharge from hospital, MD decision/surgical intervention, no progress towards goals, refusal/missing three consecutive treatments without notification or medical reason.  GP     Meira Wahba 03/12/2013, 11:54 AM Pager [phone]

## 2013-03-12 NOTE — Progress Notes (Signed)
Patient looks good today. He denies any headache. He is bright and alert and says his finger feels better.  Afebrile. Vitals are stable. On exam he is awake and alert. He is oriented and appropriate. Speech is fluent. His motor exam is very close to symmetric with very minimal left pronator drift. Wound clean and dry. Chest and abdomen stable.  Doing reasonably well following burr hole evacuation of subdural hematoma. Situation further complicated by pneumonia and finger osteomyelitis. Continue antibiotics. Once medical care is felt to stabilize patient is fine for skilled nursing facility discharge.

## 2013-03-13 LAB — GLUCOSE, CAPILLARY
Glucose-Capillary: 178 mg/dL — ABNORMAL HIGH (ref 70–99)
Glucose-Capillary: 134 mg/dL — ABNORMAL HIGH (ref 70–99)
Glucose-Capillary: 120 mg/dL — ABNORMAL HIGH (ref 70–99)

## 2013-03-13 LAB — WOUND CULTURE

## 2013-03-13 LAB — CBC
HCT: 36.1 % — ABNORMAL LOW (ref 39.0–52.0)
Hemoglobin: 12.2 g/dL — ABNORMAL LOW (ref 13.0–17.0)
RDW: 14 % (ref 11.5–15.5)
WBC: 10.1 10*3/uL (ref 4.0–10.5)

## 2013-03-13 LAB — BASIC METABOLIC PANEL
BUN: 15 mg/dL (ref 6–23)
Chloride: 101 mEq/L (ref 96–112)
GFR calc Af Amer: >90 mL/min (ref >90)
Glucose, Bld: 208 mg/dL — ABNORMAL HIGH (ref 70–99)
Potassium: 4.4 mEq/L (ref 3.5–5.1)

## 2013-03-13 LAB — TISSUE CULTURE: Gram Stain: NONE SEEN

## 2013-03-13 MED ORDER — INSULIN GLARGINE 100 UNIT/ML ~~LOC~~ SOLN
14.0000 [IU] | Freq: Every day | SUBCUTANEOUS | Status: DC
  Administered 2013-03-13 – 2013-03-20 (×8): 14 [IU] via SUBCUTANEOUS
  Filled 2013-03-13 (×12): qty 0.14

## 2013-03-13 MED ORDER — CEFTRIAXONE SODIUM 2 G IJ SOLR
2.0000 g | Freq: Two times a day (BID) | INTRAMUSCULAR | Status: DC
  Administered 2013-03-13 – 2013-03-20 (×15): 2 g via INTRAVENOUS
  Filled 2013-03-13 (×17): qty 2

## 2013-03-13 NOTE — Progress Notes (Signed)
Hydrotherapy Note   03/13/13 0900  Subjective Assessment  Subjective pt does not recall any previous Hydro.    Patient and Family Stated Goals agrees he wants finger to get well  Date of Onset 04/05/13  Prior Treatments nail removed with bedside I&D by ortho  Evaluation and Treatment  Evaluation and Treatment Procedures Explained to Patient/Family Yes  Evaluation and Treatment Procedures agreed to  Wound 03/11/13 Rt 3rd finger--distal  Date First Assessed/Time First Assessed: 03/11/13 1052   Wound Description (Comments): Rt 3rd finger--distal  Present on Admission: No  Site / Wound Assessment Granulation tissue;Painful;Pale;Pink;Yellow  % Wound base Red or Granulating 95%  % Wound base Yellow 0%  % Wound base Black 5% (dried blood at tip of finger)  % Wound base Other (Comment) 0%  Peri-wound Assessment Maceration  Margins Unattacted edges (unapproximated)  Closure None  Drainage Amount Minimal  Drainage Description Serosanguineous;No odor  Non-staged Wound Description Partial thickness  Treatment Hydrotherapy (Pulse lavage)  Dressing Type Gauze (Comment) (2x2 damp saline, dry 2x2, 2" cling wrap)  Dressing Changed Changed  Dressing Status Clean;Dry;Intact  Hydrotherapy  Pulsed Lavage with Suction (psi) 4 psi  Pulsed Lavage with Suction - Normal Saline Used Other (comment) (250)  Pulsed Lavage Tip Tip with splash shield  Pulsed lavage therapy - wound location Rt 3rd finger  Wound Therapy - Assess/Plan/Recommendations  Wound Therapy - Clinical Statement PT initially denied pain meds prior to [facility] arriving.  RN had to be called back to room for IV pain meds as pt was yelling and pulling arm away any time PT attempted to touch old dressing to remove it.  Dressing had to be cut off with Tech attempting to restrain L UE throughout hydro.    Wound Therapy - Functional Problem List limited use of Rt hand for funtional tasks  Factors Delaying/Impairing Wound Healing Diabetes  Mellitus;Infection - systemic/local;Multiple medical problems  Hydrotherapy Plan Dressing change;Debridement;Patient/family education;Pulsatile lavage with suction  Wound Therapy - Frequency 6X / week  Wound Therapy - Follow Up Recommendations Skilled nursing facility  Wound Therapy Goals - Improve the function of patient's integumentary system by progressing the wound(s) through the phases of wound healing by:  Decrease Necrotic Tissue to <15  Decrease Necrotic Tissue - Progress Progressing toward goal  Increase Granulation Tissue to >85  Increase Granulation Tissue - Progress Progressing toward goal  Decrease Length/Width/Depth by (cm) 0.2  Decrease Length/Width/Depth - Progress Progressing toward goal  Improve Drainage Characteristics Min;Serous  Improve Drainage Characteristics - Progress Progressing toward goal   Mack Hook, PT [phone]

## 2013-03-13 NOTE — Clinical Social Work Note (Addendum)
2:13pm CSW met with pt's wife, son, daughter, and extended family at bedside. Pt's son stated that he and his sister (pt's daughter) will be visiting SNF placements tomorrow (03/14/2013). CSW noted that she will be back tomorrow to check in on SNF placement choice. Pt's wife stated that she is currently deciding between [place], [place], and [place]. CSW will continue to follow and assist with discharge needs.  CSW attempted to speak with pt's wife, but pt's wife is currently not at bedside. CSW will continue to follow and attempt to contact pt's wife in regards to SNF placement decision.   Darlyn Chamber, MSW, LCSWA Clinical Social Work [phone]

## 2013-03-13 NOTE — Progress Notes (Signed)
No new complaints or problems.  Remains afebrile. Vitals are stable. Neurologic exam unchanged.  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 Situation complicated by right third digit osteomyelitis and pneumonia "community-acquired". Patient on antibiotic regimen per infectious disease. They did raise the possible question of infection in the patient's subdural space time of original surgery. I want to be clear that there was no evidence of intracranial infection at the time of surgery.

## 2013-03-13 NOTE — Progress Notes (Signed)
[facility]  Day #3 cefepime   Subjective: No new complaints   Antibiotics:  Anti-infectives   Start     Dose/Rate Route Frequency Ordered Stop   03/13/13 1300  cefTRIAXone (ROCEPHIN) 2 g in dextrose 5 % 50 mL IVPB     2 g 100 mL/hr over 30 Minutes Intravenous Every 12 hours 03/13/13 1215     03/11/13 1400  ceFEPIme (MAXIPIME) 2 g in dextrose 5 % 50 mL IVPB  Status:  Discontinued     2 g 100 mL/hr over 30 Minutes Intravenous 3 times per day 03/11/13 1302 03/13/13 1105   03/10/13 1800  vancomycin (VANCOCIN) IVPB 1000 mg/200 mL premix  Status:  Discontinued     1,000 mg 200 mL/hr over 60 Minutes Intravenous Every 8 hours 03/10/13 1628 03/12/13 1127   03/10/13 1700  piperacillin-tazobactam (ZOSYN) IVPB 3.375 g  Status:  Discontinued     3.375 g 12.5 mL/hr over 240 Minutes Intravenous 3 times per day 03/10/13 1628 03/11/13 1236   03/06/13 2200  amoxicillin-clavulanate (AUGMENTIN) 875-125 MG per tablet 1 tablet  Status:  Discontinued     1 tablet Oral Every 12 hours 03/06/13 1800 03/10/13 1617   03/05/13 1700  azithromycin (ZITHROMAX) 500 mg in dextrose 5 % 250 mL IVPB  Status:  Discontinued     500 mg 250 mL/hr over 60 Minutes Intravenous Every 24 hours 03/04/13 2040 03/06/13 1800   03/05/13 1640  cefTRIAXone (ROCEPHIN) 1 g in dextrose 5 % 50 mL IVPB  Status:  Discontinued     1 g 100 mL/hr over 30 Minutes Intravenous Every 24 hours 03/04/13 2040 03/06/13 1800   03/04/13 1840  bacitracin 50,000 Units in sodium chloride irrigation 0.9 % 500 mL irrigation  Status:  Discontinued       As needed 03/04/13 1841 03/04/13 1845   03/04/13 1545  cefTRIAXone (ROCEPHIN) 1 g in dextrose 5 % 50 mL IVPB     1 g 100 mL/hr over 30 Minutes Intravenous  Once 03/04/13 1531 03/04/13 1714   03/04/13 1545  azithromycin (ZITHROMAX) 500 mg in dextrose 5 % 250 mL IVPB     500 mg 250 mL/hr over 60 Minutes Intravenous  Once 03/04/13 1531 03/04/13 1813       Medications: Scheduled Meds: . cefTRIAXone (ROCEPHIN)  IV  2 g Intravenous Q12H  . ezetimibe  10 mg Oral Daily  . insulin aspart  0-15 Units Subcutaneous TID WC  . insulin aspart  0-5 Units Subcutaneous QHS  . insulin aspart  4 Units Subcutaneous TID WC  . insulin glargine  14 Units Subcutaneous QAC breakfast  . levETIRAcetam  500 mg Oral BID  . pantoprazole  40 mg Oral Q1200   Continuous Infusions:  PRN Meds:.acetaminophen, acetaminophen, HYDROcodone-acetaminophen, HYDROmorphone (DILAUDID) injection, morphine injection, ondansetron (ZOFRAN) IV, ondansetron, promethazine, sodium chloride   Objective: Weight change:   Intake/Output Summary (Last 24 hours) at 03/13/13 2023 Last data filed at 03/13/13 1838  Gross per 24 hour  Intake    290 ml  Output      0 ml  Net    290 ml   Blood pressure 125/53, pulse 90, temperature 98.4 F (36.9 C), temperature source Oral, resp. rate 16, height 4\' 11"  (1.499 m), weight 227 lb 15.3 oz (103.4 kg), SpO2 90.00%. Temp:  [97.7 F (36.5 C)-98.7 F (37.1 C)] 98.4 F (36.9 C) (08/06 1834) Pulse Rate:  [71-90] 90 (08/06 1834) Resp:  [16-18] 16 (08/06 1834)  BP: (114-147)/(40-74) 125/53 mmHg (08/06 1834) SpO2:  [90 %-100 %] 90 % (08/06 1834) Weight:  [227 lb 15.3 oz (103.4 kg)] 227 lb 15.3 oz (103.4 kg) (08/06 0428)  Physical Exam: General: awake but sleepy HEENT: Surgical site clean EOMI, CVS regular rate, normal r, no murmur rubs or gallops  Chest: clear to auscultation bilaterally, no wheezing, rales or rhonchi  Abdomen: soft nontender, nondistended, normal bowel sounds,  Extremities: Right middle finger bandaged he has a bilateral BKA sites are clean  Neuro: mild left sided weakness  Lymph: no new lymphadenopathy Neuro: nonfocal  Lab Results:  Recent Labs  03/12/13 0915 03/13/13 0500  WBC 10.9* 10.1  HGB 13.4 12.2*  HCT 38.4* 36.1*  PLT 255 265    BMET  Recent Labs  03/12/13 0915 03/13/13 0500  NA 132* 132*  K 4.5  4.4  CL 99 101  CO2 23 25  GLUCOSE 184* 208*  BUN 16 15  CREATININE 0.96 0.94  CALCIUM 8.9 8.8    Micro Results: Recent Results (from the past 240 hour(s))  MRSA PCR SCREENING     Status: None   Collection Time    03/04/13  9:01 PM      Result Value Range Status   MRSA by PCR NEGATIVE  NEGATIVE Final   Comment:            The GeneXpert MRSA Assay (FDA     approved for NASAL specimens     only), is one component of a     comprehensive MRSA colonization     surveillance program. It is not     intended to diagnose MRSA     infection nor to guide or     monitor treatment for     MRSA infections.  CULTURE, BLOOD (SINGLE)     Status: None   Collection Time    03/10/13  4:53 PM      Result Value Range Status   Specimen Description BLOOD LEFT ARM   Final   Special Requests BOTTLES DRAWN AEROBIC ONLY 2CC   Final   Culture  Setup Time 03/10/2013 21:45   Final   Culture     Final   Value:        BLOOD CULTURE RECEIVED NO GROWTH TO DATE CULTURE WILL BE HELD FOR 5 DAYS BEFORE ISSUING A FINAL NEGATIVE REPORT   Report Status PENDING   Incomplete  WOUND CULTURE     Status: None   Collection Time    03/10/13  6:38 PM      Result Value Range Status   Specimen Description WOUND   Final   Special Requests RIGHT MIDDLE FINGER UNDER NAIL   Final   Gram Stain     Final   Value: RARE WBC PRESENT, PREDOMINANTLY PMN     NO SQUAMOUS EPITHELIAL CELLS SEEN     NO ORGANISMS SEEN     Performed at [facility]   Culture     Final   Value: FEW PROTEUS MIRABILIS     Performed at [facility]   Report Status 03/13/2013 FINAL   Final   Organism ID, Bacteria PROTEUS MIRABILIS   Final  ANAEROBIC CULTURE     Status: None   Collection Time    03/10/13  6:38 PM      Result Value Range Status   Specimen Description WOUND   Final   Special Requests RIGHT MIDDLE FINGER UNDER NAIL   Final   Gram Stain  Final   Value: RARE WBC PRESENT, PREDOMINANTLY PMN     NO SQUAMOUS EPITHELIAL  CELLS SEEN     NO ORGANISMS SEEN     Performed at [facility]   Culture     Final   Value: NO ANAEROBES ISOLATED; CULTURE IN PROGRESS FOR 5 DAYS     Performed at [facility]   Report Status PENDING   Incomplete  TISSUE CULTURE     Status: None   Collection Time    03/10/13  6:40 PM      Result Value Range Status   Specimen Description TISSUE   Final   Special Requests RIGHT MIDDLE FINGER UNDER NAIL   Final   Gram Stain     Final   Value: NO WBC SEEN     NO ORGANISMS SEEN     Performed at [facility]   Culture     Final   Value: RARE PROTEUS MIRABILIS     Performed at [facility]   Report Status 03/13/2013 FINAL   Final   Organism ID, Bacteria PROTEUS MIRABILIS   Final    Studies/Results: No results found.    Assessment/Plan: [name] is a 79 y.o. male with  with left-sided weakness due to apparent subdural hematoma status post bur hole craniotomy now found to have osteomyelitis involving his finger with hardware that status post I&D and removal of hardware. He also has questionable pneumonia   #1 Osteomyelitis of finger sp I and D and removal of hardware: Proteus isolated on culture  --changed to ceftriaxone at CNS dosing 2g IV q 12 hours --would complete 39 more days of ceftriaxone with stop date on September 14  --he needs weekly cbc bmp faxed to Dr. Daiva Eves at [phone]   #2 Subdural hematoma sp evacuation; Described as blood but on the chance this could have been infected will make sure abx are ones with good CNS penetration  --ceftriaxone 2 g iv q 12 hours  #3 ?CAP: Cefepime fine for this   #4 Screening; check HIV, hep c  I will arrange HSFU in ID clinic in next 3 weeks  Please do not hesistate to call with further questions.  I will otherwise sign off for now    LOS: 9 days   Acey Lav 03/13/2013, 8:23 PM

## 2013-03-13 NOTE — Progress Notes (Signed)
Physical Therapy Treatment Patient Details Name: [name] MRN: [id number] DOB: 1933/07/16 Today's Date: 03/13/2013 Time: 4098-1191 PT Time Calculation (min): 24 min  PT Assessment / Plan / Recommendation  History of Present Illness [name] is a 79 year old African American male with a history of diabetes mellitus with vascular, renal, neurologic, and ophthalmologic complications, coronary artery disease, and peripheral vascular disease status post bilateral BKA who presented emergency department with increased left-sided weakness x 36 hours. The patient has experienced a significant decline in functional status over the past 3-4 weeks. His wife initially called stating that he was having increasing difficulty standing and that she could no longer care for him at home.  She requested placement in a nursing facility. She did not feel that he was able to come into the office due to his weakness. We arranged home health social work, Charity fundraiser, and physical therapy which began last week to evaluate him and to assist with placement options.  Over the past week he has had 2 falls.  EMS was called for the first of these and evaluated him.  He was not brought to the ER since there was no apparent injury. 5 days ago he experienced a second fall out of his wheelchair. His wife thinks he may have hit his head with both of these but did not appear to have any injury.  He was able to work with physical therapy last week but continued to have generalized weakness. His wife also noticed slight increase in confusion and urinary incontinence. [name], his son has been lifting him into his motorized wheelchair noticed that he was very weak on his left side. This was confirmed by the physical therapist today who called our office. We recommended that he come the emergency department for evaluation where he was found to have a large chronic subdural hematoma on head CT. Chest x-ray also showed possible pneumonia. Family does  report these been coughing recently with increased cough with eating as well. Neurosurgery has evaluated him and recommends emergent evacuation. They requested medical admission due to his medical complexity and possible pneumonia   PT Comments   Pt very resistant to mobility.  Emphasis on sitting at EOB, working to attain a more forward and upright posture in midline.  Pt unable to attend to the task at hand due to ?fear of falling while EOB.   Follow Up Recommendations  SNF     Does the patient have the potential to tolerate intense rehabilitation     Barriers to Discharge        Equipment Recommendations   (TBA post acute)    Recommendations for Other Services    Frequency Min 2X/week   Progress towards PT Goals Progress towards PT goals: Not progressing toward goals - comment (no technique found to decr pt's resistance to mobility yet)  Plan Current plan remains appropriate    Precautions / Restrictions Precautions Precautions: Fall Required Braces or Orthoses:  (Bil prostheses) Restrictions Weight Bearing Restrictions: No Other Position/Activity Restrictions: Pt is B BKA. Does not use prostetics to walk.   Pertinent Vitals/Pain     Mobility  Bed Mobility Bed Mobility: Rolling Right;Right Sidelying to Sit;Sit to Supine Rolling Right: 2: Max assist;1: +1 Total assist Right Sidelying to Sit: 1: +2 Total assist Right Sidelying to Sit: Patient Percentage: 0% (to 10%) Supine to Sit: Patient Percentage:  (almost resistive) Sit to Supine: 1: +2 Total assist Sit to Supine: Patient Percentage: 0% Details for Bed Mobility Assistance: Able to awaken  pt fully before treatment today.  Pt verbalize ready to try mobility.  Significant assist needed once roll started.  Pt resistance and pushing/leaning against the direction of motion. Transfers Transfers: Not assessed Ambulation/Gait Ambulation/Gait Assistance: Not tested (comment) Stairs: No    Exercises Other Exercises Other  Exercises: PROM LE's    PT Diagnosis:    PT Problem List:   PT Treatment Interventions:     PT Goals (current goals can now be found in the care plan section) Acute Rehab PT Goals Patient Stated Goal: none stated. PT Goal Formulation: Patient unable to participate in goal setting Time For Goal Achievement: 03/19/13 Potential to Achieve Goals: Fair  Visit Information  Last PT Received On: 03/13/13 Assistance Needed: +2 History of Present Illness: [name] is a 79 year old African American male with a history of diabetes mellitus with vascular, renal, neurologic, and ophthalmologic complications, coronary artery disease, and peripheral vascular disease status post bilateral BKA who presented emergency department with increased left-sided weakness x 36 hours. The patient has experienced a significant decline in functional status over the past 3-4 weeks. His wife initially called stating that he was having increasing difficulty standing and that she could no longer care for him at home.  She requested placement in a nursing facility. She did not feel that he was able to come into the office due to his weakness. We arranged home health social work, Charity fundraiser, and physical therapy which began last week to evaluate him and to assist with placement options.  Over the past week he has had 2 falls.  EMS was called for the first of these and evaluated him.  He was not brought to the ER since there was no apparent injury. 5 days ago he experienced a second fall out of his wheelchair. His wife thinks he may have hit his head with both of these but did not appear to have any injury.  He was able to work with physical therapy last week but continued to have generalized weakness. His wife also noticed slight increase in confusion and urinary incontinence. [name], his son has been lifting him into his motorized wheelchair noticed that he was very weak on his left side. This was confirmed by the physical therapist today  who called our office. We recommended that he come the emergency department for evaluation where he was found to have a large chronic subdural hematoma on head CT. Chest x-ray also showed possible pneumonia. Family does report these been coughing recently with increased cough with eating as well. Neurosurgery has evaluated him and recommends emergent evacuation. They requested medical admission due to his medical complexity and possible pneumonia    Subjective Data  Subjective: No...No.....No.... Patient Stated Goal: none stated.   Cognition  Cognition Arousal/Alertness: Lethargic Behavior During Therapy: Anxious Overall Cognitive Status: Impaired/Different from baseline Area of Impairment: Attention;Orientation;Following commands;Problem solving Current Attention Level: Sustained Following Commands: Follows one step commands inconsistently Problem Solving: Slow processing General Comments: Initially more verbal, but once mobility started, he could not verbalize well, likely due to fear    Balance  Balance Balance Assessed: Yes Static Sitting Balance Static Sitting - Balance Support: Right upper extremity supported (legs either clamped betw PT's legs or propped on chair .) Static Sitting - Level of Assistance: 1: +2 Total assist;1: +1 Total assist Static Sitting - Comment/# of Minutes: EOB approx 14 min working on getting pt to sit forward assisted by R UE.  Pt not as collapsed on the Right and could be  assisted into partial upright posture, but pt could not truly assist due to obvious fear of falling in the floor.  End of Session PT - End of Session Activity Tolerance: Other (comment) (limited by fear with resultant resistance posteriorly) Patient left: in bed;with call bell/phone within reach;with bed alarm set Nurse Communication: Mobility status   GP     Linzie Criss, Eliseo Gum 03/13/2013, 4:21 PM 03/13/2013  [place] Bing, PT [phone]  (pager)

## 2013-03-13 NOTE — Progress Notes (Signed)
ANTIBIOTIC CONSULT NOTE - INITIAL  Pharmacy Consult for ceftriaxone Indication: L finger osteomyelitis, posible CNS infection  No Known Allergies  Patient Measurements: Height: 4\' 11"  (149.9 cm) Weight: 227 lb 15.3 oz (103.4 kg) IBW/kg (Calculated) : 47.7  Vital Signs: Temp: 97.7 F (36.5 C) (08/06 0541) Temp src: Oral (08/06 0541) BP: 114/40 mmHg (08/06 0541) Pulse Rate: 72 (08/06 0541) Intake/Output from previous day: 08/05 0701 - 08/06 0700 In: 300 [P.O.:300] Out: -  Intake/Output from this shift:    Labs:  Recent Labs  03/11/13 0605 03/12/13 0915 03/13/13 0500  WBC 10.6* 10.9* 10.1  HGB 13.6 13.4 12.2*  PLT 321 255 265  CREATININE 0.94 0.96 0.94   Estimated Creatinine Clearance: 65.2 ml/min (by C-G formula based on Cr of 0.94). No results found for this basename: VANCOTROUGH, Leodis Binet, VANCORANDOM, GENTTROUGH, GENTPEAK, GENTRANDOM, TOBRATROUGH, TOBRAPEAK, TOBRARND, AMIKACINPEAK, AMIKACINTROU, AMIKACIN,  in the last 72 hours   Microbiology: Recent Results (from the past 720 hour(s))  MRSA PCR SCREENING     Status: None   Collection Time    03/04/13  9:01 PM      Result Value Range Status   MRSA by PCR NEGATIVE  NEGATIVE Final   Comment:            The GeneXpert MRSA Assay (FDA     approved for NASAL specimens     only), is one component of a     comprehensive MRSA colonization     surveillance program. It is not     intended to diagnose MRSA     infection nor to guide or     monitor treatment for     MRSA infections.  CULTURE, BLOOD (SINGLE)     Status: None   Collection Time    03/10/13  4:53 PM      Result Value Range Status   Specimen Description BLOOD LEFT ARM   Final   Special Requests BOTTLES DRAWN AEROBIC ONLY 2CC   Final   Culture  Setup Time 03/10/2013 21:45   Final   Culture     Final   Value:        BLOOD CULTURE RECEIVED NO GROWTH TO DATE CULTURE WILL BE HELD FOR 5 DAYS BEFORE ISSUING A FINAL NEGATIVE REPORT   Report Status PENDING    Incomplete  WOUND CULTURE     Status: None   Collection Time    03/10/13  6:38 PM      Result Value Range Status   Specimen Description WOUND   Final   Special Requests RIGHT MIDDLE FINGER UNDER NAIL   Final   Gram Stain     Final   Value: RARE WBC PRESENT, PREDOMINANTLY PMN     NO SQUAMOUS EPITHELIAL CELLS SEEN     NO ORGANISMS SEEN     Performed at [facility]   Culture     Final   Value: FEW PROTEUS MIRABILIS     Performed at [facility]   Report Status 03/13/2013 FINAL   Final   Organism ID, Bacteria PROTEUS MIRABILIS   Final  ANAEROBIC CULTURE     Status: None   Collection Time    03/10/13  6:38 PM      Result Value Range Status   Specimen Description WOUND   Final   Special Requests RIGHT MIDDLE FINGER UNDER NAIL   Final   Gram Stain     Final   Value: RARE WBC PRESENT, PREDOMINANTLY PMN  NO SQUAMOUS EPITHELIAL CELLS SEEN     NO ORGANISMS SEEN     Performed at [facility]   Culture     Final   Value: NO ANAEROBES ISOLATED; CULTURE IN PROGRESS FOR 5 DAYS     Performed at [facility]   Report Status PENDING   Incomplete  TISSUE CULTURE     Status: None   Collection Time    03/10/13  6:40 PM      Result Value Range Status   Specimen Description TISSUE   Final   Special Requests RIGHT MIDDLE FINGER UNDER NAIL   Final   Gram Stain     Final   Value: NO WBC SEEN     NO ORGANISMS SEEN     Performed at [facility]   Culture     Final   Value: RARE PROTEUS MIRABILIS     Performed at [facility]   Report Status 03/13/2013 FINAL   Final   Organism ID, Bacteria PROTEUS MIRABILIS   Final    Medical History: Past Medical History  Diagnosis Date  . Diabetes mellitus   . Hypertension   . Testicular cancer dx'd 08/2010    surg only  . CHF (congestive heart failure)   . CAD (coronary artery disease)   . Peripheral vascular disease   . Vitamin B12 deficiency   . Diabetic retinopathy   . Diabetic peripheral  neuropathy   . Cholelithiasis   . Family history of colon cancer     brother   . 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 He is also s/p I&D of right middle finger for osteomyelitis. Pharmacy consulted to transition to ceftriaxone for R finger infection as well as possible subdural infection. He is afebrile, WBC are normal, and finger cultures growing Proteus sensitive to ceftriaxone.  Azith 7/28>>7/30 Ceftriaxone 7/28>>7/30, 8/6>> Vanc 8/3>>8/5 Zosyn 8/3>>8/4 Cefepime 8/4>>8/6  8/3 BCx1 -  ngtd 8/3 R finger wound - Proteus mirabilis (pan sensitive) 8/3 R finger tissue - Proteus mirabilis (pan sensitive) MRSA PCR neg  Goal of Therapy:  Eradication of infection  Plan:  -Ceftriaxone 2 g IV q12h -Will sign off as ceftriaxone does not need renal adjustments -Please consult pharmacy again if needed  [facility], [place].D., BCPS Clinical Pharmacist Pager: [phone] 03/13/2013 12:16 PM

## 2013-03-13 NOTE — Progress Notes (Signed)
Subjective: No new issues overnight.  Now only on Cefepime per ID.  Awaiting pl[acement.  Objective: Vital signs in last 24 hours: Temp:  [97.7 F (36.5 C)-98.5 F (36.9 C)] 97.7 F (36.5 C) (08/06 0541) Pulse Rate:  [71-81] 72 (08/06 0541) Resp:  [18] 18 (08/06 0541) BP: (103-147)/(40-74) 114/40 mmHg (08/06 0541) SpO2:  [90 %-100 %] 93 % (08/06 0541) Weight:  [103.4 kg (227 lb 15.3 oz)] 103.4 kg (227 lb 15.3 oz) (08/06 0428) Weight change:  Last BM Date: 03/11/13  Intake/Output from previous day: 08/05 0701 - 08/06 0700 In: 300 [P.O.:300] Out: -  Intake/Output this shift:   General appearance: alert and no distress  Scalp steri strips R side  Eyes: no scleral icterus  Throat: oropharynx moist without erythema  Resp: clear to auscultation bilaterally  Cardio: regular rate and rhythm  GI: soft, non-tender; bowel sounds normal; no masses, no organomegaly  Extremities: no clubbing, cyanosis or edema. B BKA. Has legs in room.  R Middle finger - Now dressed postop from Sunday with simple gauze wrap  Unchanged   Lab Results:  Recent Labs  03/12/13 0915 03/13/13 0500  WBC 10.9* 10.1  HGB 13.4 12.2*  HCT 38.4* 36.1*  PLT 255 265   BMET  Recent Labs  03/12/13 0915 03/13/13 0500  NA 132* 132*  K 4.5 4.4  CL 99 101  CO2 23 25  GLUCOSE 184* 208*  BUN 16 15  CREATININE 0.96 0.94  CALCIUM 8.9 8.8    Studies/Results: No results found.  Medications:  I have reviewed the patient's current medications. Scheduled: . ceFEPime (MAXIPIME) IV  2 g Intravenous Q8H  . ezetimibe  10 mg Oral Daily  . insulin aspart  0-15 Units Subcutaneous TID WC  . insulin aspart  0-5 Units Subcutaneous QHS  . insulin aspart  4 Units Subcutaneous TID WC  . insulin glargine  11 Units Subcutaneous QAC breakfast  . levETIRAcetam  500 mg Oral BID  . pantoprazole  40 mg Oral Q1200   Continuous:  OZH:YQMVHQIONGEXB, acetaminophen, HYDROcodone-acetaminophen, HYDROmorphone (DILAUDID)  injection, morphine injection, ondansetron (ZOFRAN) IV, ondansetron, promethazine, sodium chloride  Assessment/Plan: Principal Problem:  1.Chronic subdural hematoma- improved mental status. Left sided weakness has resolved. Post-op management per Neurosurgery. Postop day #9: The patient is on the floor on the Neurosurgical team.  Active Problems:  2. Community acquired pneumonia-completed course  3. DIABETES MELLITUS- continue Lantus and SSI qAC- CBGs. Will likely settle as infection clears. 4. HYPERTENSION- BP stable off medications  5. CAD- resume medical therapy when BP will tolerate. Neurosurgery to decide if ever a candidate for resuming Plavix given his bleed, would favor holding off.  6. CHF- appears euvolemic.  7. PVD s/p BKA- ASA, Plavix on hold due to #1.  8. Disposition- anticipate discharge to SNF when stable per NSU. Continue PT/OT/ST.  9. Finger pain- Osteo,  Cefepime per ID, has PICC.  Will sign off as otherwise medically stable and awaiting disposition, likely to day, to SNF.    LOS: 9 days   Shanzay Hepworth W 03/13/2013, 7:47 AM

## 2013-03-14 ENCOUNTER — Inpatient Hospital Stay (HOSPITAL_COMMUNITY): Payer: Medicare Other

## 2013-03-14 LAB — CBC
HCT: 35.6 % — ABNORMAL LOW (ref 39.0–52.0)
MCHC: 35.4 g/dL (ref 30.0–36.0)
RDW: 14.1 % (ref 11.5–15.5)

## 2013-03-14 LAB — GLUCOSE, CAPILLARY
Glucose-Capillary: 125 mg/dL — ABNORMAL HIGH (ref 70–99)
Glucose-Capillary: 204 mg/dL — ABNORMAL HIGH (ref 70–99)
Glucose-Capillary: 138 mg/dL — ABNORMAL HIGH (ref 70–99)

## 2013-03-14 LAB — BASIC METABOLIC PANEL
BUN: 15 mg/dL (ref 6–23)
Creatinine, Ser: 0.91 mg/dL (ref 0.50–1.35)
GFR calc Af Amer: >90 mL/min (ref >90)
GFR calc non Af Amer: 80 mL/min — ABNORMAL LOW (ref >90)
Potassium: 4.4 mEq/L (ref 3.5–5.1)

## 2013-03-14 NOTE — Progress Notes (Signed)
No new issues. Overall medical condition stable. Continue efforts at therapy. Continue antibiotic treatment. Skilled nursing facility placement pending.

## 2013-03-14 NOTE — Progress Notes (Signed)
Subjective: The patient is awake he is staring out the window today I should note he is certainly had a change in his mentation since we have last seen he will not respond to questions and initially does not follow verbal orders. Have discussed this with his nurse Johnny, and have recommended that if this is a change in his typical behavior,I  recommend he contact neurosurgery to ascertain  if a repeat CT scan is in order. Typically on past visits the patient is  very talkative and does follow verbal orders.As noted per his nurse today this is a change in his affect since this morning.   Objective: Vital signs in last 24 hours: Temp:  [98.2 F (36.8 C)-99 F (37.2 C)] 98.2 F (36.8 C) (08/07 1328) Pulse Rate:  [50-94] 90 (08/07 1328) Resp:  [16-20] 16 (08/07 1328) BP: (84-192)/(23-102) 101/44 mmHg (08/07 1328) SpO2:  [90 %-100 %] 100 % (08/07 1328) Weight:  [103.4 kg (227 lb 15.3 oz)] 103.4 kg (227 lb 15.3 oz) (08/07 0532)  Intake/Output from previous day: 08/06 0701 - 08/07 0700 In: 410 [P.O.:360; IV Piggyback:50] Out: 850 [Urine:850] Intake/Output this shift:     Recent Labs  03/12/13 0915 03/13/13 0500  HGB 13.4 12.2*    Recent Labs  03/12/13 0915 03/13/13 0500  WBC 10.9* 10.1  RBC 4.12* 3.87*  HCT 38.4* 36.1*  PLT 255 265    Recent Labs  03/12/13 0915 03/13/13 0500  NA 132* 132*  K 4.5 4.4  CL 99 101  CO2 23 25  BUN 16 15  CREATININE 0.96 0.94  GLUCOSE 184* 208*  CALCIUM 8.9 8.8   No results found for this basename: LABPT, INR,  in the last 72 hours  The patient initially is a phasic and does not respond to verbal orders he is awake, during the course of his dressing changes he does respond to pain and finally does try to speak however is difficult for him. Evaluation of the finger shows he has healthy granulation tissue without significant drainage no signs of acute infection his refill is intact. Assessment/Plan: At this juncture the patient simply  needs daily wet-to-dry dressing changes performed to the remaining open area about the distal ulnar aspect of the nailbed. Once this is fully granulated,  wet-to-dry dressing changes will be DC'd.  I have asked his nurse Bethann Berkshire to contact neurosurgery in regards to his mental status and need for possible repeat CT scan based on their recommendations.Marland Kitchen He will continue ceftriaxone 2 g every 12 per infectious disease recommendation. He will need to see [place] towards the end of next week for a wound check and repeat radiographs.   Tawonda Legaspi L 03/14/2013, 3:29 PM

## 2013-03-14 NOTE — Progress Notes (Signed)
Occupational Therapy Treatment Patient Details Name: [name] MRN: [id number] DOB: August 25, 1932 Today's Date: 03/14/2013 Time: 4098-1191 OT Time Calculation (min): 27 min  OT Assessment / Plan / Recommendation  History of present illness [name] is a 79 year old African American male with a history of diabetes mellitus with vascular, renal, neurologic, and ophthalmologic complications, coronary artery disease, and peripheral vascular disease status post bilateral BKA who presented emergency department with increased left-sided weakness x 36 hours. The patient has experienced a significant decline in functional status over the past 3-4 weeks. His wife initially called stating that he was having increasing difficulty standing and that she could no longer care for him at home.  She requested placement in a nursing facility. She did not feel that he was able to come into the office due to his weakness. We arranged home health social work, Charity fundraiser, and physical therapy which began last week to evaluate him and to assist with placement options.  Over the past week he has had 2 falls.  EMS was called for the first of these and evaluated him.  He was not brought to the ER since there was no apparent injury. 5 days ago he experienced a second fall out of his wheelchair. His wife thinks he may have hit his head with both of these but did not appear to have any injury.  He was able to work with physical therapy last week but continued to have generalized weakness. His wife also noticed slight increase in confusion and urinary incontinence. [name], his son has been lifting him into his motorized wheelchair noticed that he was very weak on his left side. This was confirmed by the physical therapist today who called our office. We recommended that he come the emergency department for evaluation where he was found to have a large chronic subdural hematoma on head CT. Chest x-ray also showed possible pneumonia. Family  does report these been coughing recently with increased cough with eating as well. Neurosurgery has evaluated him and recommends emergent evacuation. They requested medical admission due to his medical complexity and possible pneumonia   OT comments  [name] progressed to EOB total (A) supported sitting this session. [name] following simple commands 75% of session with tactile and auditory cues. [name] needs a 15 second delay between commands to respond. [name] could benefit from OOB to chair with hoyer lift daily by staff. [name] is high risk for skin breakdown and needs repositioning frequently.  Follow Up Recommendations  SNF    Barriers to Discharge       Equipment Recommendations  Other (comment) (defer to SNF- w/c, bowel program, w/c cushion)    Recommendations for Other Services    Frequency Min 1X/week   Progress towards OT Goals Progress towards OT goals: Progressing toward goals  Plan Discharge plan remains appropriate    Precautions / Restrictions Precautions Precautions: Fall Required Braces or Orthoses: Other Brace/Splint (BIl prosthetics ) Restrictions Weight Bearing Restrictions: No Other Position/Activity Restrictions: [name] is B BKA. Does not use prostetics to walk.   Pertinent Vitals/Pain Facial grimace and pulling away right Ue with any tactile input    ADL  Grooming: Wash/dry face;Minimal assistance Where Assessed - Grooming: Supported sitting Equipment Used: Other (comment) (BIl prostetics) ADL Comments: [name] completed supine <>sit eob this session. [name] with very little verbalizations. [name] with bil LE prostetics don prior to mobility. [name] reports after x2 sessions now incr sitting balance with prostetics don. [name] with strong posterior lean and a fear of  falling forward. [name] reaching with Rt UE to right peripheral side view. [name] provided neck rotation to just past midline Rt to left. [name] with eyes deivated right but tracking a few times just past midline. [name] unable to sustain left gaze. [name] with  no voluntary Lt Ue movement noted during session. [name] moaned several times but without a clear purpose. [name] sitting EOB ~15 minutes. [name] reaching hand over hand to Rt side toward the back of a chair placed next to right knee. [name] needed encouragement to hold chair. [name] wanting to grab rt foot rail in extreme right peripheral visual field.     OT Diagnosis:    OT Problem List:   OT Treatment Interventions:     OT Goals(current goals can now be found in the care plan section) Acute Rehab OT Goals Patient Stated Goal: none stated. OT Goal Formulation: With patient/family Time For Goal Achievement: 03/19/13 Potential to Achieve Goals: Fair ADL Goals [name] Will Perform Eating: with min assist;sitting [name] Will Perform Grooming: with min assist;sitting [name] Will Perform Upper Body Bathing: with min assist;sitting Additional ADL Goal #1: [name] will transfer to EOB with mod assist in prep for adls on EOB.  Visit Information  Last OT Received On: 03/14/13 Assistance Needed: +2 History of Present Illness: [name] is a 79 year old African American male with a history of diabetes mellitus with vascular, renal, neurologic, and ophthalmologic complications, coronary artery disease, and peripheral vascular disease status post bilateral BKA who presented emergency department with increased left-sided weakness x 36 hours. The patient has experienced a significant decline in functional status over the past 3-4 weeks. His wife initially called stating that he was having increasing difficulty standing and that she could no longer care for him at home.  She requested placement in a nursing facility. She did not feel that he was able to come into the office due to his weakness. We arranged home health social work, Charity fundraiser, and physical therapy which began last week to evaluate him and to assist with placement options.  Over the past week he has had 2 falls.  EMS was called for the first of these and evaluated him.  He was not brought  to the ER since there was no apparent injury. 5 days ago he experienced a second fall out of his wheelchair. His wife thinks he may have hit his head with both of these but did not appear to have any injury.  He was able to work with physical therapy last week but continued to have generalized weakness. His wife also noticed slight increase in confusion and urinary incontinence. [name], his son has been lifting him into his motorized wheelchair noticed that he was very weak on his left side. This was confirmed by the physical therapist today who called our office. We recommended that he come the emergency department for evaluation where he was found to have a large chronic subdural hematoma on head CT. Chest x-ray also showed possible pneumonia. Family does report these been coughing recently with increased cough with eating as well. Neurosurgery has evaluated him and recommends emergent evacuation. They requested medical admission due to his medical complexity and possible pneumonia    Subjective Data      Prior Functioning       Cognition  Cognition Arousal/Alertness: Awake/alert Behavior During Therapy: Flat affect Overall Cognitive Status: Impaired/Different from baseline Area of Impairment: Attention;Memory;Following commands Current Attention Level: Sustained Memory: Decreased short-term memory Following Commands: Follows one step commands inconsistently;Follows one  step commands with increased time Problem Solving: Slow processing General Comments: very few verbalizations so cognition difficult to fully address    Mobility  Bed Mobility Rolling Right: 2: Max assist Rolling Left: 2: Max assist Left Sidelying to Sit: 1: +2 Total assist;HOB elevated Left Sidelying to Sit: Patient Percentage: 0% Supine to Sit: 1: +2 Total assist;HOB elevated Supine to Sit: Patient Percentage: 0% Sitting - Scoot to Edge of Bed: 1: +1 Total assist Sit to Supine: 1: +2 Total assist;HOB flat Sit to  Supine: Patient Percentage: 0% Details for Bed Mobility Assistance: [name] with minimal trunk initation for log rolling. [name] agreeable but required pad to (A) and help [name] initiate. [name] resistent at EOB due to fear of anterior LOB Transfers Transfers: Not assessed    Exercises  Other Exercises Other Exercises: PROM Left UE - digits , wrist, elbow. [name] with limited PROm at digits and elbow. [name] with tone present left UE  Other Exercises: PROM hip flexion and hip abduction x5 reps   Balance Static Sitting Balance Static Sitting - Balance Support: Right upper extremity supported;Feet supported Static Sitting - Level of Assistance: 1: +1 Total assist Static Sitting - Comment/# of Minutes: [name] sitting eob for prolonged period and tolerating well. [name] holdign with Rt UE onto foot board . [name] weight bearing on Lt LE.   End of Session OT - End of Session Activity Tolerance: Patient tolerated treatment well Patient left: in bed;with call bell/phone within reach;with bed alarm set Nurse Communication: Mobility status;Precautions  GO     Lucile Shutters 03/14/2013, 2:46 PM Pager: [phone]

## 2013-03-14 NOTE — Progress Notes (Signed)
Mr.Lintner wasn't fully orient when he woke up this morning, but he got better after his breakfast. He been alert and orient most of the time till this afternoon. After lunch he talked to me and recognize his family members in the room and told me "that's my wife, with her name". When ortho PA came to see the pt, we noticed that pt was stairing to one point without giving any answers, but he was responding to pain. Informed Dr.Pool and received order for CT head and lab works. Talked to the patient after half an hour, and he was fully orient except time. His wife mentioned that pt been doing like this some times for the last two days. Will keep close monitoring.  Danne [place]

## 2013-03-14 NOTE — Clinical Social Work Note (Signed)
CSW attempted to speak with pt. Pt was awake with eyes open, staring out of window. Pt did not acknowledge CSW as CSW spoke to him. CSW was attempting to speak with pt's wife and/or children to discuss SNF placement choice. CSW will continue to attempt to contact family.  Darlyn Chamber, MSW, LCSWA Clinical Social Work [phone]

## 2013-03-14 NOTE — Progress Notes (Signed)
Physical Therapy Wound Treatment and Discharge Summary Patient Details  Name: [name] MRN: [id number] Date of Birth: September 12, 1932  Today's Date: 03/14/2013 Time: 1009-1031 Time Calculation (min): 22 min  Subjective  Subjective: more confused; didn't recognize his wife Date of Onset: 04/05/13 Prior Treatments: nail removed with bedside I&D by ortho  Pain Score:  Pt confused and unable to rate; cries out with withdrawing hand with treatment; RN provided medication to assist with pain control   Wound Assessment  Clinical Statement: Wound is now 100% granulation with no purulence (drainage serous and minimal on old dressing). No further need for hydrotherapy as there is no necrotic tissue to remove. Recommend moist to moist dressing (use of hydrogel would be preferred) to protect granulation tissue as wound continues to heal. D/C from hydrotherapy with nursing to continue dressing changes.   Wound 03/11/13 Rt 3rd finger--distal (Active)  Site / Wound Assessment Granulation tissue;Painful;Red 03/14/2013 10:37 AM  % Wound base Red or Granulating 100% 03/14/2013 10:37 AM  % Wound base Yellow 0% 03/14/2013 10:37 AM  % Wound base Black 0% 03/14/2013 10:37 AM  % Wound base Other (Comment) 0% 03/14/2013 10:37 AM  Peri-wound Assessment Maceration 03/13/2013  9:00 AM  Wound Length (cm) 2 cm 03/11/2013 10:49 AM  Wound Width (cm) 1.7 cm 03/11/2013 10:49 AM  Wound Depth (cm) 0.3 cm 03/11/2013 10:49 AM  Margins Unattacted edges (unapproximated) 03/14/2013 10:37 AM  Closure None 03/14/2013 10:37 AM  Drainage Amount Minimal 03/14/2013 10:37 AM  Drainage Description No odor;Serous 03/14/2013 10:37 AM  Non-staged Wound Description Partial thickness 03/14/2013 10:37 AM  Treatment Hydrotherapy (Pulse lavage);Packing (Saline gauze);Tape changed 03/14/2013 10:37 AM  Dressing Type Gauze (Comment) 03/14/2013 10:37 AM  Dressing Changed Changed 03/14/2013 10:37 AM  Dressing Status Clean;Dry;Intact 03/14/2013 10:37 AM       Hydrotherapy Pulsed lavage therapy - wound location: Rt 3rd finger Pulsed Lavage with Suction (psi): 4 psi Pulsed Lavage with Suction - Normal Saline Used: Other (comment) (250) Pulsed Lavage Tip: Tip with splash shield   Wound Assessment and Plan  Wound Therapy - Assess/Plan/Recommendations Wound Therapy - Clinical Statement: Wound is now 100% granulation with no purulence (drainage serous and minimal on old dressing). No further need for hydrotherapy as there is no necrotic tissue to remove. Recommend moist to moist dressing (use of hydrogel would be preferred) to protect granulation tissue as wound continues to heal. D/C from hydrotherapy with nursing to continue dressing changes.  Wound Therapy - Functional Problem List: limited use of Rt hand for funtional tasks Factors Delaying/Impairing Wound Healing: Diabetes Mellitus;Infection - systemic/local;Multiple medical problems Hydrotherapy Plan: Other (comment) (discontinue hydro) Wound Therapy - Current Recommendations: Other (comment) (nursing for dressing changes) Wound Therapy - Follow Up Recommendations: Skilled nursing facility  Wound Therapy Goals- Improve the function of patient's integumentary system by progressing the wound(s) through the phases of wound healing (inflammation - proliferation - remodeling) by: Decrease Necrotic Tissue to: <15 Decrease Necrotic Tissue - Progress: Met Increase Granulation Tissue to: >85 Increase Granulation Tissue - Progress: Met Decrease Length/Width/Depth by (cm): 0.2 Decrease Length/Width/Depth - Progress: Other (comment) (not assessed) Improve Drainage Characteristics: Min;Serous Improve Drainage Characteristics - Progress: Met Goals/treatment plan/discharge plan were made with and agreed upon by patient/family: No, Patient unable to participate in goals/treatment/discharge plan and family unavailable (wife left room after bandage removed)  Goals will be updated until maximal potential  achieved or discharge criteria met.  Discharge criteria: when goals achieved, discharge from hospital, MD decision/surgical intervention, no progress towards goals,  refusal/missing three consecutive treatments without notification or medical reason.  GP     Elo Marmolejos 03/14/2013, 10:45 AM Pager [phone]

## 2013-03-15 LAB — HIV-1 RNA QUANT-NO REFLEX-BLD
HIV 1 RNA Quant: <20 copies/mL (ref <20)
HIV-1 RNA Quant, Log: <1.3 {Log} (ref <1.30)

## 2013-03-15 LAB — GLUCOSE, CAPILLARY
Glucose-Capillary: 148 mg/dL — ABNORMAL HIGH (ref 70–99)
Glucose-Capillary: 135 mg/dL — ABNORMAL HIGH (ref 70–99)

## 2013-03-15 NOTE — Progress Notes (Signed)
Patient ID: [name], male   DOB: 1932-08-13, 79 y.o.   MRN: [id number] Patient is status post irrigation and debridement right middle finger. He seen at bedside. He is not cooperative. He does not make sentences. Dr. Jordan Likes is aware of this. There has been no trauma or fall recently.  The a patient has a right middle finger which is improved he has good healthy robust granulation tissue. He does have some pain here. I've changed his dressing at bedside. I would recommend continued treatment for osteomyelitis based on the cultures and infectious disease recommendations.  He is stable from my standpoint. I am concerned that his mental status is drastically changed in terms of when I saw him Saturday. I've let nursing staff know and be aware of this.  At present juncture we'll continue a observatory care plan for his middle finger which appears to be stable.  I discussed this with his wife  [name]

## 2013-03-15 NOTE — Progress Notes (Signed)
With a period of transient aphasia yesterday. Symptoms completely resolved without intervention. No obvious seizure activity or other medical problems during this time. followup head CT scan looks good.    He remains afebrile. His vitals are stable. Neurologically he is awake and alert. He is oriented and appropriate. He follows commands readily. Still has a mild left hemiparesis.  Overall stable. I'm not sure what caused the patient's transient neurologic episode yesterday but I do not think it is anything clinically serious. Patient ready for skilled nursing facility for my standpoint.

## 2013-03-16 LAB — CULTURE, BLOOD (SINGLE): Culture: NO GROWTH

## 2013-03-16 LAB — GLUCOSE, CAPILLARY
Glucose-Capillary: 137 mg/dL — ABNORMAL HIGH (ref 70–99)
Glucose-Capillary: 133 mg/dL — ABNORMAL HIGH (ref 70–99)

## 2013-03-16 LAB — ANAEROBIC CULTURE

## 2013-03-16 NOTE — Progress Notes (Signed)
Subjective: Patient reports Arousable alert follows commands. Wife is concerned that is not healing well patient states he is not hungry.  Objective: Vital signs in last 24 hours: Temp:  [97.3 F (36.3 C)-97.9 F (36.6 C)] 97.7 F (36.5 C) (08/09 1050) Pulse Rate:  [66-114] 114 (08/09 1050) Resp:  [18-20] 20 (08/09 1050) BP: (114-137)/(50-71) 137/63 mmHg (08/09 1050) SpO2:  [92 %-99 %] 94 % (08/09 1050) Weight:  [101.8 kg (224 lb 6.9 oz)-102.5 kg (225 lb 15.5 oz)] 101.8 kg (224 lb 6.9 oz) (08/09 0500)  Intake/Output from previous day: 08/08 0701 - 08/09 0700 In: 60 [P.O.:60] Out: -  Intake/Output this shift: Total I/O In: 360 [P.O.:360] Out: 300 [Urine:300]  Mild left-sided drift.  Lab Results:  Recent Labs  03/14/13 1600  WBC 12.2*  HGB 12.6*  HCT 35.6*  PLT 270   BMET  Recent Labs  03/14/13 1600  NA 131*  K 4.4  CL 99  CO2 24  GLUCOSE 208*  BUN 15  CREATININE 0.91  CALCIUM 8.8    Studies/Results: Ct Head Wo Contrast  03/14/2013   *RADIOLOGY REPORT*  Clinical Data: Postop subdural hematoma.  CT HEAD WITHOUT CONTRAST  Technique:  Contiguous axial images were obtained from the base of the skull through the vertex without contrast.  Comparison: 03/08/2013  Findings: Postoperative changes with a burr hole in the right frontal region.  Subdural drain no longer visualized.  Large right subdural hematoma of mixed density again noted, possibly increased in size over the high right cerebral hemisphere.  This measures approximate 3 cm in thickness on image 26.  Small amount of pneumocephalus.  8 mm of right to left midline shift, similar to prior study.  No hydrocephalus.  The  IMPRESSION: Interval removal of the right subdural drain with stable or increasing size of the right subdural hematoma.  The hematoma is mixed density. Stable right to left midline shift.   Original Report Authenticated By: Charlett Nose, M.D.    Assessment/Plan: Stable status post drainage of  subdural hematoma. Patient's wife requests results of CT from 69. Stated that CT looks slightly better than preop.  LOS: 12 days  Continue to observe.   [name] J 03/16/2013, 1:20 PM

## 2013-03-17 LAB — GLUCOSE, CAPILLARY
Glucose-Capillary: 144 mg/dL — ABNORMAL HIGH (ref 70–99)
Glucose-Capillary: 70 mg/dL (ref 70–99)
Glucose-Capillary: 119 mg/dL — ABNORMAL HIGH (ref 70–99)

## 2013-03-17 NOTE — Progress Notes (Signed)
Pt more alert this evening, and talking more with staff and family members.  Complaining of finger hurting this evening, medicated with po Tylenol. [name] [employer]

## 2013-03-17 NOTE — Progress Notes (Signed)
Patient ID: [name], male   DOB: Jul 01, 1933, 79 y.o.   MRN: [id number] Patient seen at bedside. Patient is more pocketed today. He knows the year and the president.  His right middle finger dressing is changed. There is no abscess or active drainage. There is healthy granulation tissue. I will continue to monitor him. We will continue the antibiotic care plan for osteomyelitis of his middle finger. I will request that he had daily dressing changes once he is discharged to a skilled facility.  Given the osteomyelitis, his diabetes, his multiple medical problems and other issues surrounding him I certainly have concerns about his long-term healing prognosis. I would like see him in the office in 12 days. For now I believe the antibiotics and continue dressing changes are the best plans.  The patient and his family have verbalized an understanding to this. They understand the plans, risks and issues surrounding his care.  Dominica Severin M.D.

## 2013-03-17 NOTE — Progress Notes (Signed)
Filed Vitals:   03/16/13 2100 03/17/13 0140 03/17/13 0500 03/17/13 1003  BP: 108/62 135/55 99/65 122/82  Pulse: 69 71 72 74  Temp: 98.2 F (36.8 C) 98.7 F (37.1 C) 97.9 F (36.6 C) 98 F (36.7 C)  TempSrc: Oral Oral Oral Oral  Resp: 20 20 20 20   Height:      Weight:   103.3 kg (227 lb 11.8 oz)   SpO2: 96% 91% 92% 99%    CBC  Recent Labs  03/14/13 1600  WBC 12.2*  HGB 12.6*  HCT 35.6*  PLT 270   BMET  Recent Labs  03/14/13 1600  NA 131*  K 4.4  CL 99  CO2 24  GLUCOSE 208*  BUN 15  CREATININE 0.91  CALCIUM 8.8    Patient resting comfortably in bed. Denies headache. Awake and alert, oriented to name,  Hospital, and 2014. Wound healing nicely.  Plan: Continuing current care.  Hewitt Shorts, MD 03/17/2013, 10:16 AM

## 2013-03-18 LAB — GLUCOSE, CAPILLARY: Glucose-Capillary: 167 mg/dL — ABNORMAL HIGH (ref 70–99)

## 2013-03-18 MED ORDER — GLUCERNA SHAKE PO LIQD
237.0000 mL | Freq: Three times a day (TID) | ORAL | Status: DC
  Administered 2013-03-18 – 2013-03-20 (×5): 237 mL via ORAL

## 2013-03-18 NOTE — Progress Notes (Signed)
INITIAL NUTRITION ASSESSMENT  DOCUMENTATION CODES Per approved criteria  -Obesity Unspecified   INTERVENTION: 1. Glucerna Shake po TID, each supplement provides 220 kcal and 10 grams of protein.   NUTRITION DIAGNOSIS: Increased nutrient needs related to wound healing as evidenced by stage 3 pressure ulcer, head and finger wounds.   Goal: PO intake to meet >/=90% estimated protein needs  Monitor:  PO intake, weight trends, wound healing, labs  Reason for Assessment: Low Braden Score  79 y.o. male  Admitting Dx: Chronic subdural hematoma  ASSESSMENT: Pt admitted for evacuation of subdural hematoma on 7/28. Pt had a seizure while emerging from anesthesia. Pt was also noted to have PNA on admission, possible increased cough with oral intake. Evaluated by SLP for appropriate diet--> Regular/Thin.  Pt noted to have increased pain in right middle finger. Ortho consulted and MRI of finger completed. S/p nail removal and I&D of distal phalanx, consistent with osteomyelitis.  RD pulled to chart for low Braden score, pt with increased nutrient needs given wound healing from both finger wound and evacuation site in head, in addition to pressure ulcer on coccyx. . Pt has been eating fair. Likely would benefit from oral nutrition supplements.  Pt states he is doing well with his meals. Agreeable to trying oral nutrition supplements.   Height: Ht Readings from Last 1 Encounters:  03/04/13 4\' 11"  (1.499 m)  6' prior to B BKA's    Weight: Wt Readings from Last 1 Encounters:  03/18/13 225 lb 1.4 oz (102.1 kg)    Ideal Body Weight: 160 lbs adj for BKA's  % Ideal Body Weight: 140%  Wt Readings from Last 10 Encounters:  03/18/13 225 lb 1.4 oz (102.1 kg)  03/18/13 225 lb 1.4 oz (102.1 kg)  03/18/13 225 lb 1.4 oz (102.1 kg)  07/19/11 258 lb 11.2 oz (117.346 kg)  01/29/08 210 lb (95.255 kg)  12/25/07 210 lb (95.255 kg)    Usual Body Weight: 225 lbs   % Usual Body Weight: 100%  BMI:   33.9 kg/(m^2) adj for BKA's, obesity class 1  Estimated Nutritional Needs: Kcal: 2100-2300 Protein: 120-130 gm  Fluid: 2.1-2.3 L   Skin: finger wound, head incision, stage 3 pressure ulcer on coccyx  Diet Order: Carb Control  EDUCATION NEEDS: -No education needs identified at this time   Intake/Output Summary (Last 24 hours) at 03/18/13 0936 Last data filed at 03/18/13 0839  Gross per 24 hour  Intake    600 ml  Output   1200 ml  Net   -600 ml    Last BM: 8/11   Labs:   Recent Labs Lab 03/12/13 0915 03/13/13 0500 03/14/13 1600  NA 132* 132* 131*  K 4.5 4.4 4.4  CL 99 101 99  CO2 23 25 24   BUN 16 15 15   CREATININE 0.96 0.94 0.91  CALCIUM 8.9 8.8 8.8  GLUCOSE 184* 208* 208*    CBG (last 3)   Recent Labs  03/17/13 1633 03/17/13 2025 03/18/13 0627  GLUCAP 70 119* 106*    Scheduled Meds: . cefTRIAXone (ROCEPHIN)  IV  2 g Intravenous Q12H  . ezetimibe  10 mg Oral Daily  . insulin aspart  0-15 Units Subcutaneous TID WC  . insulin aspart  0-5 Units Subcutaneous QHS  . insulin aspart  4 Units Subcutaneous TID WC  . insulin glargine  14 Units Subcutaneous QAC breakfast  . levETIRAcetam  500 mg Oral BID  . pantoprazole  40 mg Oral Q1200  Continuous Infusions:   Past Medical History  Diagnosis Date  . Diabetes mellitus   . Hypertension   . Testicular cancer dx'd 08/2010    surg only  . CHF (congestive heart failure)   . CAD (coronary artery disease)   . Peripheral vascular disease   . Vitamin B12 deficiency   . Diabetic retinopathy   . Diabetic peripheral neuropathy   . Cholelithiasis   . Family history of colon cancer     brother   . 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 Below knee leg amputation  2005    Right  . Below knee leg amputation  2006    Left  . Appendectomy    . Eye surgery  2012    Laser  . Ptca    . Ines Bloomer hole Right 03/04/2013    Procedure: [name];  Surgeon: Temple Pacini, MD;   Location: [facility];  Service: Neurosurgery;  Laterality: Right;  [facility]     Clarene Duke RD, LDN Pager [phone] After Hours pager [phone]

## 2013-03-18 NOTE — Progress Notes (Signed)
Filed Vitals:   03/18/13 0522 03/18/13 1043 03/18/13 1447 03/18/13 1803  BP: 112/52 139/63 150/68 144/66  Pulse: 77 80 89 88  Temp: 98.5 F (36.9 C) 98.4 F (36.9 C) 98.2 F (36.8 C) 97.6 F (36.4 C)  TempSrc: Oral Oral Oral Oral  Resp: 19 20 20 20   Height:      Weight: 102.1 kg (225 lb 1.4 oz)     SpO2: 98% 100% 94% 94%    Patient resting comfortably in bed. Without complaints. Awake and alert, following commands. Wound well-healed.  Plan: Continue current care.  Hewitt Shorts, MD 03/18/2013, 7:23 PM

## 2013-03-18 NOTE — Progress Notes (Addendum)
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  CT of head revealed a chronic subdural hematoma which neurosurgery evacuated using burr hole.  His stay was complicated by R 3rd finger osteomyelitis and CAP.     PT Comments   Goals update due today.  New goals written based on progress.  Pt is progressing, but very slowly.  He is resistant to movement, stiff despite preforming PROM and bed mobility prior to sitting EOB.  He pushes posteriorly and is too fearful of falling to reach anteriorly to try to decrease posterior pushing.  He might be a good candidate to sit with the [place] [facility] Plus to help him keep his trunk shifted anteriorly.  He continues to be appropriate for SNF level reahb.    Follow Up Recommendations  SNF     Does the patient have the potential to tolerate intense rehabilitation    No  Barriers to Discharge   None      Equipment Recommendations  Hospital bed;Other (comment) (hoyer lift)    Recommendations for Other Services   None  Frequency Min 2X/week   Progress towards PT Goals Progress towards PT goals: Progressing toward goals (very slowly)  Plan Current plan remains appropriate    Precautions / Restrictions Precautions Precautions: Fall Precaution Comments: Pt has had 2-3 recent falls with steady decline over last month Required Braces or Orthoses: Other Brace/Splint Other Brace/Splint: bil BKA prostheses.   Restrictions Other Position/Activity  Restrictions: Pt is B BKA. Does not use prostetics to walk, but does use them to stand and transfer.     Pertinent Vitals/Pain See vitals flow sheet.    Mobility  Bed Mobility Rolling Right: With rail;1: +1 Total assist Rolling Right: Patient Percentage: 10% Rolling Left: With rail;1: +1 Total assist Rolling Left: Patient Percentage: 10% Supine to Sit: HOB elevated;With rails;1: +1 Total assist Supine to Sit: Patient Percentage: 10% Sitting - Scoot to Edge of Bed: 1: +1 Total assist Sit to Supine: 1: +2 Total assist;HOB flat Sit to Supine: Patient Percentage: 0% Details for Bed Mobility Assistance: Pt very little help with bed mobility and supine<-> sit transfers due to stiffness, posterior pushing and decreased awareness of where he body is in space.  On top of that he has a fear of falling.  Even with manual and tactile cues to reach for railing pt was unable to assist with rail to get to sitting.   Transfers Transfers: Not assessed (pt unable ) Ambulation/Gait Ambulation/Gait Assistance: Not tested (comment) (pt unable )    Exercises Other Exercises Other Exercises: PROM bil legs knee flex/ext, hip flexion, abduction.  Bil upper extremiety AAROM bil elbow flex/ext, bil shoulder flexion.   Other Exercises: Bil prostheses donned in bed to give pt a chance to contact floor bilaterally    PT Goals (current goals can now be found in the care plan section) Acute Rehab PT Goals Patient Stated Goal: wife wants him to get back to PLOF standing with prostheses on to get to [facility].   PT Goal Formulation: With family Time For Goal Achievement: 04/01/13 Potential  to Achieve Goals: Fair  Visit Information  Last PT Received On: 03/18/13 Assistance Needed: +2 (heavy +2 assist) History of Present Illness: [name] is a 79 year old African American male with a history of diabetes mellitus with vascular, renal, neurologic, and ophthalmologic complications, coronary artery disease, and peripheral  vascular disease status post bilateral BKA (with bil prosthesis in room) who presented emergency department with increased left-sided weakness.  CT of head revealed a chronic subdural hematoma which neurosurgery evacuated using burr hole.  His stay was complicated by R 3rd finger osteomyelitis and CAP.      Subjective Data  Subjective: Pt eyes closed unless cued to open.  Very significant right gaze preference.  Patient Stated Goal: wife wants him to get back to PLOF standing with prostheses on to get to [facility].     Cognition  Cognition Arousal/Alertness: Awake/alert Overall Cognitive Status: Impaired/Different from baseline Area of Impairment: Attention;Memory;Following commands Current Attention Level: Sustained Memory: Decreased short-term memory Following Commands: Follows one step commands consistently (to the best of his physical ability) Problem Solving: Slow processing General Comments: Pt is very fearful of falling, pushing posteriorly in sitting.      Balance  Static Sitting Balance Static Sitting - Balance Support: Right upper extremity supported;Feet supported (bil prostheses donned in bed) Static Sitting - Level of Assistance: 1: +1 Total assist;Patient percentage (comment) (pt 50-80%) Static Sitting - Comment/# of Minutes: Pt is pushing posteriorly in sitting with right hand on end of bed frame when therapist is trying to shift his weight anteriorly from behind.  Pt has breif moments of supervision in sitting (even with posterior lean), but is unable to hold it >30 seconds.  I tried to work with him on shifting his weight anteriorly by reaching with alternating hands forward, but pt was too fearful for this to be effective in breaking his posterior lean.    End of Session PT - End of Session Activity Tolerance: Patient limited by fatigue;Patient limited by pain Patient left: in bed;with call bell/phone within reach;with family/visitor present;with bed alarm set (wife in room )     Lurena Joiner B. Amman Bartel, PT, DPT [phone]   03/18/2013, 3:37 PM

## 2013-03-19 LAB — GLUCOSE, CAPILLARY
Glucose-Capillary: 149 mg/dL — ABNORMAL HIGH (ref 70–99)
Glucose-Capillary: 179 mg/dL — ABNORMAL HIGH (ref 70–99)
Glucose-Capillary: 151 mg/dL — ABNORMAL HIGH (ref 70–99)

## 2013-03-19 MED ORDER — COLLAGENASE 250 UNIT/GM EX OINT
TOPICAL_OINTMENT | Freq: Every day | CUTANEOUS | Status: DC
  Administered 2013-03-19 – 2013-03-20 (×2): via TOPICAL
  Filled 2013-03-19: qty 30

## 2013-03-19 NOTE — Progress Notes (Signed)
Subjective: No complaints- oriented to place, date, person.  Unsure of discharge plans.  Denies pain, headache, confusion.  Objective: Vital signs in last 24 hours: Temp:  [97.6 F (36.4 C)-98.5 F (36.9 C)] 98.2 F (36.8 C) (08/12 0500) Pulse Rate:  [80-93] 89 (08/12 0500) Resp:  [20] 20 (08/12 0500) BP: (117-150)/(53-68) 119/58 mmHg (08/12 0500) SpO2:  [93 %-100 %] 93 % (08/12 0500) Weight change:  Last BM Date: 03/18/13  CBG (last 3)   Recent Labs  03/18/13 1659 03/18/13 2201 03/19/13 0648  GLUCAP 152* 129* 149*    Intake/Output from previous day: 08/11 0701 - 08/12 0700 In: 480 [P.O.:480] Out: 200 [Urine:200] Intake/Output this shift:    General appearance: alert and no distress Eyes: no scleral icterus Throat: oropharynx moist without erythema Resp: clear to auscultation bilaterally Cardio: regular rate and rhythm GI: soft, non-tender; bowel sounds normal; no masses,  no organomegaly Extremities: no clubbing, cyanosis or edema Neurologic: minimal left sided weakness  Lab Results:  Lab Results  Component Value Date   INR 1.16 03/04/2013   INR 1.09 11/14/2010   INR 1.0 12/05/2007    Studies/Results: No results found.   Medications: Scheduled: . cefTRIAXone (ROCEPHIN)  IV  2 g Intravenous Q12H  . ezetimibe  10 mg Oral Daily  . feeding supplement  237 mL Oral TID BM  . insulin aspart  0-15 Units Subcutaneous TID WC  . insulin aspart  0-5 Units Subcutaneous QHS  . insulin aspart  4 Units Subcutaneous TID WC  . insulin glargine  14 Units Subcutaneous QAC breakfast  . levETIRAcetam  500 mg Oral BID  . pantoprazole  40 mg Oral Q1200   Continuous:   Assessment/Plan: Patient remains in the hospital on Neurosurgical service- mental status improving (approaching baseline) with no recurrent seizures noted.  He remains on antibiotics (rocephin 2grams IV q12 until 9/14 per ID) for his right middle finger Proteus osteomyelitis with post-op dressing changes/tx per  orthopaedics.  Sugar and BP control are adequate on current regimen.  Appreciate ongoing management by NSU and Ortho.  I am available if needed for medical support but will continue to follow peripherally as medical issues are stable.  Awaiting discharge to SNF rehab per Neurosurgery.  Will need follow-up in ID clinic in 3 weeks and weekly CBC, BMET for medication monitoring (fax results to Dr. Daiva Eves at [phone]).   LOS: 15 days   SHAW,W DOUGLAS 03/19/2013, 7:10 AM .

## 2013-03-19 NOTE — Progress Notes (Signed)
Speech Language Pathology Dysphagia Treatment Patient Details Name: [name] MRN: [id number] DOB: Feb 25, 1933 Today's Date: 03/19/2013 Time: 9147-8295 SLP Time Calculation (min): 22 min  Assessment / Plan / Recommendation Clinical Impression  Pt again seen to reinforce chin tuck strategy. Pt was unable to verbalize or demonstrate precautions initially. SLP provided moderate verbal and tactile cues for complete chin tuck prior to the swallow, which he return demosntrated x7. Pt was able to verbalize risk, but again needed moderate verbal cues to repeat precaution at end of session. Unfortunately, this is the only effective strategy to prevent aspiration in this pt with baseline dysphagia and suspect compliance is poor. Nectar thick liquids do not improve function. Aspiration risk is moderate.     Diet Recommendation  Continue with Current Diet: Thin liquid;Regular    SLP Plan Continue with current plan of care   Pertinent Vitals/Pain NA   Swallowing Goals  SLP Swallowing Goals Patient will utilize recommended strategies during swallow to increase swallowing safety with: Minimal cueing;Minimal assistance Swallow Study Goal #2 - Progress: Progressing toward goal  General    Oral Cavity - Oral Hygiene     Dysphagia Treatment Treatment focused on: Skilled observation of diet tolerance;Utilization of compensatory strategies Treatment Methods/Modalities: Skilled observation Patient observed directly with PO's: Yes Type of PO's observed: Thin liquids Feeding: Needs assist;Able to feed self Liquids provided via: Straw Pharyngeal Phase Signs & Symptoms: Wet vocal quality Type of cueing: Verbal;Tactile Amount of cueing: Moderate   GO    Harlon Ditty, [place] CCC-SLP [phone]  [name] 03/19/2013, 11:29 AM

## 2013-03-19 NOTE — Consult Note (Addendum)
WOC consult Note Reason for Consult: Consult requested for sacral pressure ulcer.  Wife at bedside states this has been present "for awhile" and he was receiving  Assistance from home health; they were applying a hydrocolloid. Pt has been critically ill earlier this admission and gone to the OR during 2 separate occasions.  He is frequently incontinent and it is difficult to keep wound from becoming soiled. He is having difficulty with swallowing and nutritional status. Multiple systemic factors have combined to decline wound status. Wound type: Wound is currently unstageable to lower area near coccyx, in close proximity to rectum. Pressure Ulcer POA: Yes Measurement:5X7X.1cm stage 2 wound across sacrum and buttocks, 1X1X.1cm stage 2 wound located next to other site. Both sites with pink moist wound beds At 6:00 o'clock of larger wound, there is a section of 2X1cm unstageable wound, 100% yellow slough. Drainage (amount, consistency, odor) Small amt yellow drainage, no odor Periwound:intact skin surrounding Dressing procedure/placement/frequency: Santyl ointment to chemically debride nonviable tissue.  Foam dressing to small stage 2 wound to protect and promote healing. Air mattress to decrease pressure. Primary team is working towards optimizing nutrition. Discussed plan of care with wife at bedside and she assessed wound during consult.  Appears to understand plan of care and asked appropriate questions. Please re-consult if further assistance is needed.  Thank-you,  Cammie Mcgee MSN, RN, CWOCN, [place], CNS [phone]

## 2013-03-19 NOTE — Progress Notes (Signed)
Filed Vitals:   03/18/13 1803 03/18/13 2100 03/19/13 0150 03/19/13 0500  BP: 144/66 117/62 134/53 119/58  Pulse: 88 81 93 89  Temp: 97.6 F (36.4 C) 98.2 F (36.8 C) 98.5 F (36.9 C) 98.2 F (36.8 C)  TempSrc: Oral Oral Oral Oral  Resp: 20 20 20 20   Height:      Weight:      SpO2: 94% 98% 98% 93%     Patient resting in bed comfortably. Denies complaints. Wound well-healed.  Plan: Awaiting placed in an SNF. Will need continued IV antibiotics per the recommendation of Randall Hiss, MD (infectious disease consultant). Continuing IV antibiotics for now. Continuing physical therapy.  Hewitt Shorts, MD 03/19/2013, 8:20 AM

## 2013-03-20 LAB — GLUCOSE, CAPILLARY: Glucose-Capillary: 170 mg/dL — ABNORMAL HIGH (ref 70–99)

## 2013-03-20 MED ORDER — HEPARIN SOD (PORK) LOCK FLUSH 100 UNIT/ML IV SOLN
250.0000 [IU] | INTRAVENOUS | Status: DC | PRN
  Administered 2013-03-20: 500 [IU]
  Filled 2013-03-20: qty 3

## 2013-03-20 MED ORDER — DEXTROSE 5 % IV SOLN: 2.0000 g | Freq: Two times a day (BID) | INTRAVENOUS | Status: AC

## 2013-03-20 MED ORDER — COLLAGENASE 250 UNIT/GM EX OINT: TOPICAL_OINTMENT | Freq: Every day | CUTANEOUS | Status: DC

## 2013-03-20 MED ORDER — INSULIN GLARGINE 100 UNIT/ML ~~LOC~~ SOLN: 14.0000 [IU] | Freq: Every day | SUBCUTANEOUS | Status: DC

## 2013-03-20 MED ORDER — LEVETIRACETAM 500 MG PO TABS: 500.0000 mg | ORAL_TABLET | Freq: Two times a day (BID) | ORAL | Status: DC

## 2013-03-20 MED ORDER — HEPARIN SOD (PORK) LOCK FLUSH 100 UNIT/ML IV SOLN
250.0000 [IU] | Freq: Every day | INTRAVENOUS | Status: DC
  Filled 2013-03-20: qty 3

## 2013-03-20 MED ORDER — HYDROCODONE-ACETAMINOPHEN 5-325 MG PO TABS: 1.0000 | ORAL_TABLET | ORAL | Status: DC | PRN

## 2013-03-20 NOTE — Discharge Summary (Signed)
Physician Discharge Summary  Patient ID: [name] MRN: [id number] DOB/AGE: Apr 28, 1933 79 y.o.  Admit date: 03/04/2013 Discharge date: 03/20/2013  Admission Diagnoses:  Subdural hematoma, right middle finger osteomyelitis  Discharge Diagnoses:  Subdural hematoma, right middle finger osteomyelitis, stage II wound pressure ulcer Principal Problem:   Chronic subdural hematoma Active Problems:   DIABETES MELLITUS   HYPERTENSION   CAD   CHF   PVD   Community acquired pneumonia   Discharged Condition: fair  Hospital Course: Patient presented to the emergency room, and was found to have a large subdural hematoma, he was taken to surgery by Dr. Altamease Oiler for evacuation via right frontal parietal burr hole. He was managed in the ICU and made steady recovery, but was found to have swelling of the right middle finger and was taken to surgery by Dr. Dominica Severin who found osteomyelitis involving the right middle finger, and performed an I&D and bony debridement. Patient was seen infectious disease consultation by Dr. Randall Hiss who recommended Rocephin 2 g every 12 hours to be stopped on September 14. The patient had a PICC line placed for antibiotic administration access. Patient was followed throughout his hospitalization by his primary physician Dr. [name]. Patient was found to have pressure ulcerations bottom and was initially treated by the nursing staff with hydrocolloid, but subsequently was seen in wound ostomy care consultation. They have recommended Santyl ointment to the sacral wound daily, to be covered with a moist dressing and tape. The smaller buttock wound, they've recommended a foam dressing to be changed every 5 days or when necessary soiling. Case management was requested for skilled nursing facility placement, and the patient is scheduled to be transferred today. Patient will need continued wound ostomy care followup at the SNF.   From a neurosurgical  perspective, his incision is healed well. Followup CT scan shows substantial decrease in the size of the subdural hematoma, and Dr. Lindalou Hose plan is to have him return for followup in about 2-3 weeks with a repeat CT of the brain without contrast.  Dr. Amanda Pea wants the patient to return for followup the week of August 18. Any questions regarding the right middle finger wound and dressings will need to be addressed by Dr. Amanda Pea.  Dr. Daiva Eves once weekly CBCs and BMETs to be faxed to his office weekly [phone]). Patient is to followup with Dr. Daiva Eves in 2-3 weeks. Any questions regarding antibiotic therapy and patient's PICC will need to be addressed by Dr. Daiva Eves.  Patient is to followup with his primary physician Dr. Clelia Croft in the next 2-3 weeks.   Discharge Exam: Blood pressure 125/59, pulse 79, temperature 98.3 F (36.8 C), temperature source Oral, resp. rate 20, height 4\' 11"  (1.499 m), weight 102.1 kg (225 lb 1.4 oz), SpO2 100.00%.  Disposition: Skilled nursing facility   Future Appointments Provider Department Dept Phone   04/11/2013 2:00 PM Judyann Munson, MD [facility] [phone]   02/14/2014 10:30 AM [facility] [phone]   02/14/2014 11:30 AM [facility] [phone]   Patient to arrive 15 minutes prior to appointment time. Patient to pick up oral contrast at least 1 day prior to exam, unless otherwise instructed by your physician. No solid food 4 hours prior to exam. Liquids and Medicines are okay.   02/18/2014 10:30 AM Benjiman Core, MD [facility] MEDICAL ONCOLOGY [phone]  Medication List    STOP taking these medications       ALKA-SELTZER ANTACID PO     aspirin 325 MG tablet     clopidogrel 75 MG tablet  Commonly known as:  PLAVIX     HYDROcodone-acetaminophen 7.5-500 MG per tablet  Commonly known as:  LORTAB  Replaced  by:  HYDROcodone-acetaminophen 5-325 MG per tablet     traMADol 50 MG tablet  Commonly known as:  ULTRAM      TAKE these medications       carvedilol 6.25 MG tablet  Commonly known as:  COREG  Take 6.25 mg by mouth daily.     collagenase ointment  Commonly known as:  SANTYL  Apply topically daily.     dextrose 5 % SOLN 50 mL with cefTRIAXone 2 G SOLR 2 g  Inject 2 g into the vein every 12 (twelve) hours.     ezetimibe 10 MG tablet  Commonly known as:  ZETIA  Take 10 mg by mouth daily.     HYDROcodone-acetaminophen 5-325 MG per tablet  Commonly known as:  NORCO/VICODIN  Take 1 tablet by mouth every 4 (four) hours as needed for pain.     insulin aspart 100 UNIT/ML injection  Commonly known as:  novoLOG  Inject 4 Units into the skin 3 (three) times daily with meals.     insulin glargine 100 UNIT/ML injection  Commonly known as:  LANTUS  Inject 0.14 mL (14 Units total) into the skin daily before breakfast.     levETIRAcetam 500 MG tablet  Commonly known as:  KEPPRA  Take 1 tablet (500 mg total) by mouth 2 (two) times daily.     lisinopril 10 MG tablet  Commonly known as:  PRINIVIL,ZESTRIL  Take 10 mg by mouth daily.     simvastatin 40 MG tablet  Commonly known as:  ZOCOR  Take 40 mg by mouth at bedtime.     solifenacin 5 MG tablet  Commonly known as:  VESICARE  Take 5 mg by mouth daily.     tamsulosin 0.4 MG Caps capsule  Commonly known as:  FLOMAX  Take 0.4 mg by mouth daily.         Signed: Hewitt Shorts, MD 03/20/2013, 7:46 AM

## 2013-03-20 NOTE — Progress Notes (Signed)
Speech Language Pathology Dysphagia Treatment Patient Details Name: [name] MRN: [id number] DOB: 1932/09/15 Today's Date: 03/20/2013 Time: 1015-1030 SLP Time Calculation (min): 15 min  Assessment / Plan / Recommendation Clinical Impression  F/u for diagnostic treatment to provide education to patient and spouse on recommended swallow strategy of use of chin tuck with thin liquids to prevent aspiration.  Spouse verbalized understanding.  Patient observed directly with thin liquid by cup/straw. Moderate verbal and tactile cues required for patient to complete strategy effectively.  Recommend continued ST treatment at next level of care to reinforce strategy to increase safety of the swallow.  Continue full supervision with all meals to provide necessary cues.  Recommend results of MBS to be sent to SLP at [facility] for complete details and recommendations.     Diet Recommendation  Continue with Current Diet: Regular;Thin liquid    SLP Plan Continue with current plan of care      Swallowing Goals  SLP Swallowing Goals Swallow Study Goal #2 - Progress: Progressing toward goal  General Temperature Spikes Noted: No Respiratory Status: Room air Behavior/Cognition: Alert;Cooperative;Requires cueing Oral Cavity - Dentition: Dentures, top;Dentures, bottom Patient Positioning: Upright in bed  Oral Cavity - Oral Hygiene Does patient have any of the following "at risk" factors?: Other - dysphagia Patient is HIGH RISK - Oral Care Protocol followed (see row info): Yes Patient is AT RISK - Oral Care Protocol followed (see row info): Yes   Dysphagia Treatment Treatment focused on: Facilitation of pharyngeal phase;Utilization of compensatory strategies Treatment Methods/Modalities: Skilled observation;Differential diagnosis Patient observed directly with PO's: Yes Type of PO's observed: Thin liquids Feeding: Able to feed self;Needs assist Liquids provided via: Cup;Straw Pharyngeal Phase Signs &  Symptoms: Suspected delayed swallow initiation;Delayed cough Type of cueing: Verbal;Tactile Amount of cueing: Moderate   GO   Moreen Fowler MS, CCC-SLP [phone]  [facility] 03/20/2013, 11:10 AM

## 2013-03-21 ENCOUNTER — Other Ambulatory Visit: Payer: Self-pay | Admitting: Geriatric Medicine

## 2013-03-21 ENCOUNTER — Non-Acute Institutional Stay (SKILLED_NURSING_FACILITY): Payer: Medicare Other | Admitting: Nurse Practitioner

## 2013-03-21 ENCOUNTER — Encounter: Payer: Self-pay | Admitting: Nurse Practitioner

## 2013-03-21 DIAGNOSIS — L8992 Pressure ulcer of unspecified site, stage 2: Secondary | ICD-10-CM

## 2013-03-21 DIAGNOSIS — M869 Osteomyelitis, unspecified: Secondary | ICD-10-CM

## 2013-03-21 DIAGNOSIS — I6203 Nontraumatic chronic subdural hemorrhage: Secondary | ICD-10-CM

## 2013-03-21 DIAGNOSIS — I10 Essential (primary) hypertension: Secondary | ICD-10-CM

## 2013-03-21 DIAGNOSIS — L89309 Pressure ulcer of unspecified buttock, unspecified stage: Secondary | ICD-10-CM

## 2013-03-21 DIAGNOSIS — I6200 Nontraumatic subdural hemorrhage, unspecified: Secondary | ICD-10-CM

## 2013-03-21 DIAGNOSIS — L89302 Pressure ulcer of unspecified buttock, stage 2: Secondary | ICD-10-CM

## 2013-03-21 MED ORDER — HYDROCODONE-ACETAMINOPHEN 5-325 MG PO TABS: 1.0000 | ORAL_TABLET | ORAL | Status: DC | PRN

## 2013-03-21 NOTE — Progress Notes (Signed)
Patient ID: [name], male   DOB: March 02, 1933, 79 y.o.   MRN: [id number]   PCP: [name], MD    No Known Allergies  Chief Complaint  Patient presents with  . Hospitalization Follow-up    HPI:  79 year old male went to the hospital after falling out of WC. Patient was found to have a large subdural hematoma, and was found to have swelling of the right middle finger therefore was then taken to surgery by Dr. Dominica Severin who found osteomyelitis involving the right middle finger, and performed an I&D and bony debridement.   Patient was seen and now follows with infectious disease Dr. Randall Hiss who recommended Rocephin 2 g every 12 hours to be stopped on September 14. The patient now has a PICC line placed for antibiotic administration access.    Pt is now at [facility] for ongoing therapy and IV antibiotic administration.  Wife is at bedside and pt and wife without any complaints at this time.  Review of Systems:  DATA OBTAINED: from patient,and wife and nursing GENERAL: Feels well no fevers, fatigue, appetite changes SKIN: No itching, rash; finger and bottom wounds  HEENT- no complaints  RESPIRATORY: No cough, wheezing, SOB CARDIAC: No chest pain, palpitations, lower extremity edema , B BKA GI: No abdominal pain, No N/V/D or constipation, No heartburn or reflux   GU: No dysuria, frequency or urgency, or incontinence   MUSCULOSKELETAL: No unrelieved bone/joint pain NEUROLOGIC: unsteady; can't sit up without falling over has to be monitored cont  PSYCHIATRIC: No overt anxiety or sadness. Sleeps well. No behavior issue.      Past Medical History  Diagnosis Date  . Diabetes mellitus   . Hypertension   . Testicular cancer dx'd 08/2010    surg only  . CHF (congestive heart failure)   . CAD (coronary artery disease)   . Peripheral vascular disease   . Vitamin B12 deficiency   . Diabetic retinopathy   . Diabetic peripheral neuropathy   . Cholelithiasis   .  Family history of colon cancer     brother   . 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 Below knee leg amputation  2005    Right  . Below knee leg amputation  2006    Left  . Appendectomy    . Eye surgery  2012    Laser  . Ptca    . Ines Bloomer hole Right 03/04/2013    Procedure: [name];  Surgeon: Temple Pacini, MD;  Location: [facility];  Service: Neurosurgery;  Laterality: Right;  [name]    Social History:   reports that he has quit smoking. He has never used smokeless tobacco. He reports that he does not drink alcohol or use illicit drugs.  Family History  Problem Relation Age of Onset  . Colon cancer Brother     Medications: Patient's Medications  New Prescriptions   No medications on file  Previous Medications   CARVEDILOL (COREG) 6.25 MG TABLET    Take 6.25 mg by mouth daily.   COLLAGENASE (SANTYL) OINTMENT    Apply topically daily.   DEXTROSE 5 % SOLN 50 ML WITH CEFTRIAXONE 2 G SOLR 2 G    Inject 2 g into the vein every 12 (twelve) hours.   EZETIMIBE (ZETIA) 10 MG TABLET    Take 10 mg by mouth daily.   HYDROCODONE-ACETAMINOPHEN (NORCO/VICODIN) 5-325 MG PER TABLET    Take 1 tablet  by mouth every 4 (four) hours as needed for pain.   INSULIN ASPART (NOVOLOG) 100 UNIT/ML INJECTION    Inject 4 Units into the skin 3 (three) times daily with meals.   INSULIN GLARGINE (LANTUS) 100 UNIT/ML INJECTION    Inject 0.14 mL (14 Units total) into the skin daily before breakfast.   LEVETIRACETAM (KEPPRA) 500 MG TABLET    Take 1 tablet (500 mg total) by mouth 2 (two) times daily.   LISINOPRIL (PRINIVIL,ZESTRIL) 10 MG TABLET    Take 10 mg by mouth daily.     SIMVASTATIN (ZOCOR) 40 MG TABLET    Take 40 mg by mouth at bedtime.     SOLIFENACIN (VESICARE) 5 MG TABLET    Take 5 mg by mouth daily.   TAMSULOSIN HCL (FLOMAX) 0.4 MG CAPS    Take 0.4 mg by mouth daily.    Modified Medications   No medications on file  Discontinued Medications   No medications  on file     Physical Exam:  Filed Vitals:   03/21/13 1630  BP: 110/66  Pulse: 70  Temp: 98 F (36.7 C)  Resp: 20    GENERAL APPEARANCE: Alert, conversant. Appropriately groomed. No acute distress.   SKIN: No diaphoresis rash, wound finger and buttocks dressed- following with wound care nurse HEENT- negative  RESPIRATORY: Breathing is even, unlabored. Lung sounds are clear    CARDIOVASCULAR: Heart RRR no murmurs, rubs or gallops. No peripheral edema., BKA ARTERIAL: radial pulse 2+ GASTROINTESTINAL: Abdomen is soft, non-tender, not distended w/ normal bowel sounds.   GENITOURINARY: Bladder non tender, not distended   MUSCULOSKELETAL:BKA bilaterally NEUROLOGIC: L sided weakness     Labs reviewed: Basic Metabolic Panel:  Recent Labs  16/10/96 0915 03/13/13 0500 03/14/13 1600  NA 132* 132* 131*  K 4.5 4.4 4.4  CL 99 101 99  CO2 23 25 24   GLUCOSE 184* 208* 208*  BUN 16 15 15   CREATININE 0.96 0.94 0.91  CALCIUM 8.9 8.8 8.8   Liver Function Tests:  Recent Labs  08/17/12 1015 02/12/13 0955  AST 6 9  ALT <6 Repeated and Verified 8  ALKPHOS 97 105  BILITOT 0.39 0.34  PROT 6.4 6.2*  ALBUMIN 2.3* 2.7*   No results found for this basename: LIPASE, AMYLASE,  in the last 8760 hours No results found for this basename: AMMONIA,  in the last 8760 hours CBC:  Recent Labs  08/17/12 1015 02/12/13 0955 03/04/13 1434  03/12/13 0915 03/13/13 0500 03/14/13 1600  WBC 7.8 8.8 15.5*  < > 10.9* 10.1 12.2*  NEUTROABS 4.9 5.5 12.0*  --   --   --   --   HGB 13.3 13.2 13.5  < > 13.4 12.2* 12.6*  HCT 39.1 39.2 38.3*  < > 38.4* 36.1* 35.6*  MCV 97.3 94.0 93.4  < > 93.2 93.3 92.7  PLT 203 224 293  < > 255 265 270  < > = values in this interval not displayed. Cardiac Enzymes: No results found for this basename: CKTOTAL, CKMB, CKMBINDEX, TROPONINI,  in the last 8760 hours BNP: No components found with this basename: POCBNP,  CBG:  Recent Labs  03/19/13 2123 03/20/13 0645  03/20/13 1138  GLUCAP 151* 136* 170*     Assessment/Plan  1. HYPERTENSION Patient is stable; continue current regimen. Will monitor and make changes as necessary.  2. Osteomyelitis of finger of right hand Dr. Amanda Pea to see the patient for followup the week of August 18. And per hospital  recs Any questions regarding the right middle finger wound and dressings will need to be addressed by Dr. Amanda Pea. Dr. Daiva Eves once weekly CBCs and BMETs to be faxed to his office weekly [phone]). Patient is to followup with Dr. Daiva Eves in 2-3 weeks. Any questions regarding antibiotic therapy and patient's PICC will need to be addressed by Dr. Daiva Eves.  3. Chronic subdural hematoma pts Followup CT scan shows substantial decrease in the size of the subdural hematoma and the plan is to follow up with Dr. Jordan Likes to cont to follow hematoma.  4. Pressure ulcer of buttock, unspecified laterality, stage II Stable; Santyl ointment to the sacral wound daily, to be covered with a moist dressing and tape. The smaller buttock wound, they've recommended a foam dressing to be changed every 5 days or when necessary soiling.

## 2013-03-22 DIAGNOSIS — L89309 Pressure ulcer of unspecified buttock, unspecified stage: Secondary | ICD-10-CM

## 2013-03-22 DIAGNOSIS — S61209A Unspecified open wound of unspecified finger without damage to nail, initial encounter: Secondary | ICD-10-CM

## 2013-03-27 ENCOUNTER — Non-Acute Institutional Stay (SKILLED_NURSING_FACILITY): Payer: Medicare Other | Admitting: Internal Medicine

## 2013-03-27 DIAGNOSIS — I10 Essential (primary) hypertension: Secondary | ICD-10-CM

## 2013-03-27 DIAGNOSIS — E785 Hyperlipidemia, unspecified: Secondary | ICD-10-CM

## 2013-03-27 DIAGNOSIS — L89309 Pressure ulcer of unspecified buttock, unspecified stage: Secondary | ICD-10-CM

## 2013-03-27 DIAGNOSIS — L8992 Pressure ulcer of unspecified site, stage 2: Secondary | ICD-10-CM

## 2013-03-27 DIAGNOSIS — I6203 Nontraumatic chronic subdural hemorrhage: Secondary | ICD-10-CM

## 2013-03-27 DIAGNOSIS — I6200 Nontraumatic subdural hemorrhage, unspecified: Secondary | ICD-10-CM

## 2013-03-27 DIAGNOSIS — L89302 Pressure ulcer of unspecified buttock, stage 2: Secondary | ICD-10-CM

## 2013-03-27 DIAGNOSIS — E119 Type 2 diabetes mellitus without complications: Secondary | ICD-10-CM

## 2013-03-27 DIAGNOSIS — M869 Osteomyelitis, unspecified: Secondary | ICD-10-CM

## 2013-03-27 DIAGNOSIS — I509 Heart failure, unspecified: Secondary | ICD-10-CM

## 2013-03-28 ENCOUNTER — Other Ambulatory Visit: Payer: Self-pay | Admitting: Internal Medicine

## 2013-03-28 ENCOUNTER — Encounter: Payer: Self-pay | Admitting: Internal Medicine

## 2013-03-28 DIAGNOSIS — S065X9A Traumatic subdural hemorrhage with loss of consciousness of unspecified duration, initial encounter: Secondary | ICD-10-CM

## 2013-03-28 DIAGNOSIS — L89309 Pressure ulcer of unspecified buttock, unspecified stage: Secondary | ICD-10-CM | POA: Insufficient documentation

## 2013-03-28 DIAGNOSIS — M869 Osteomyelitis, unspecified: Secondary | ICD-10-CM | POA: Insufficient documentation

## 2013-03-28 NOTE — Assessment & Plan Note (Signed)
Pt on Zetia and Zocor- will check FLP and CMP

## 2013-03-28 NOTE — Assessment & Plan Note (Signed)
Stable, continue current meds 

## 2013-03-28 NOTE — Assessment & Plan Note (Signed)
Controlled   continue present meds

## 2013-03-28 NOTE — Assessment & Plan Note (Signed)
Noted during hospitalization and wound care was consulted because of proximity to rectum;santyl ointment daily and moist dressing; foam dressing to smaller wound; pt is being f/u by wound care here, they may have changed care as needed; they are reported to be improving but slowly;one abrrier has been getting pt to sit part of day instead of lying in bed

## 2013-03-28 NOTE — Assessment & Plan Note (Addendum)
Pt is s/p I and D and bony debridement  And txed with rocephin 2 gm IM q 12 hr until 04/21/2013; Dr Amanda Pea wants to see pt week 8/18 for follow up;Dr Daiva Eves wants once weekly CBC and BMP sent to his office [phone];has appt 04/11/2013 with Dr Aram Beecham snyder, [place], [place]; any questions about abx or PICC go to Dr Daiva Eves

## 2013-03-28 NOTE — Assessment & Plan Note (Signed)
Pt on insulin;BS in mid to high 100's; HbA1c-7.8; continue present

## 2013-03-28 NOTE — Assessment & Plan Note (Addendum)
Presenting with fucntional decline, confusion, iNability to walK and finally L side weakness where he was found to have chronic R side subdural hematoma that was evacuated via Ines Bloomer hole; PT and OT are key and only time will tell whether pt will regain enough function to return home to his wife;pt needs f/u Ct head in 2-3 weeks; on keppra

## 2013-03-28 NOTE — Progress Notes (Signed)
MRN: [id number] Name: Marshawn Normoyle  Sex: male Age: 79 y.o. DOB: 1933/01/17  PSC #: [name] Facility/Room: 223 Level Of Care: SNF Provider: Merrilee Seashore D Emergency Contacts: Extended Emergency Contact Information Primary Emergency Contact: Thiem,Alice Address: [address]          [place], [place] [address] [place] Home Phone: [phone] Relation: Spouse Secondary Emergency Contact: Joneen Caraway [place] Home Phone: [phone] Relation: Son  Code Status: Full  Allergies: Review of patient's allergies indicates no known allergies.  Chief Complaint  Patient presents with  . nursing home admission    HPI: Patient is 79 y.o. male who had subdural hematoma, osteomyelitis of finger and buttocks pressure ulcers requiring skilled care after d/c from hospital.  Past Medical History  Diagnosis Date  . Diabetes mellitus   . Hypertension   . Testicular cancer dx'd 08/2010    surg only  . CHF (congestive heart failure)   . CAD (coronary artery disease)   . Peripheral vascular disease   . Vitamin B12 deficiency   . Diabetic retinopathy   . Diabetic peripheral neuropathy   . Cholelithiasis   . Family history of colon cancer     brother   . 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 Osteomyelitis of finger of right hand 03/2013    R middle finger  . Pressure ulcer of buttock 03/2013    stage 2    Past Surgical History  Procedure Laterality Date  . Below knee leg amputation  2005    Right  . Below knee leg amputation  2006    Left  . Appendectomy    . Eye surgery  2012    Laser  . Ptca    . Ines Bloomer hole Right 03/04/2013    Procedure: [name];  Surgeon: Temple Pacini, MD;  Location: [facility];  Service: Neurosurgery;  Laterality: Right;  [name]       Medication List       This list is accurate as of: 03/27/13 11:59 PM.  Always use your most recent med list.               carvedilol 6.25 MG tablet  Commonly known as:  COREG   Take 6.25 mg by mouth daily.     collagenase ointment  Commonly known as:  SANTYL  Apply topically daily.     dextrose 5 % SOLN 50 mL with cefTRIAXone 2 G SOLR 2 g  Inject 2 g into the vein every 12 (twelve) hours.     ezetimibe 10 MG tablet  Commonly known as:  ZETIA  Take 10 mg by mouth daily.     HYDROcodone-acetaminophen 5-325 MG per tablet  Commonly known as:  NORCO/VICODIN  Take 1 tablet by mouth every 4 (four) hours as needed for pain.     insulin aspart 100 UNIT/ML injection  Commonly known as:  novoLOG  Inject 4 Units into the skin 3 (three) times daily with meals.     insulin glargine 100 UNIT/ML injection  Commonly known as:  LANTUS  Inject 0.14 mL (14 Units total) into the skin daily before breakfast.     levETIRAcetam 500 MG tablet  Commonly known as:  KEPPRA  Take 1 tablet (500 mg total) by mouth 2 (two) times daily.     lisinopril 10 MG tablet  Commonly known as:  PRINIVIL,ZESTRIL  Take 10 mg by mouth daily.     simvastatin 40 MG tablet  Commonly known  as:  ZOCOR  Take 40 mg by mouth at bedtime.     solifenacin 5 MG tablet  Commonly known as:  VESICARE  Take 5 mg by mouth daily.     tamsulosin 0.4 MG Caps capsule  Commonly known as:  FLOMAX  Take 0.4 mg by mouth daily.        No orders of the defined types were placed in this encounter.     There is no immunization history on file for this patient.  History  Substance Use Topics  . Smoking status: Former Games developer  . Smokeless tobacco: Never Used  . Alcohol Use: No    Family history is noncontributory    Review of Systems  DATA OBTAINED: from patient,and wife and wound care GENERAL: Feels well no fevers, fatigue, appetite changes SKIN: No itching, rash; finger and bottom are better HEENT- no c/o RESPIRATORY: No cough, wheezing, SOB CARDIAC: No chest pain, palpitations, lower extremity edema , B BKA GI: No abdominal pain, No N/V/D or constipation, No heartburn or reflux  GU: No  dysuria, frequency or urgency, or incontinence  MUSCULOSKELETAL: No unrelieved bone/joint pain NEUROLOGIC: per wife very little change in MS since evacuation;pt is still confused, can't sit up without falling over PSYCHIATRIC: No overt anxiety or sadness. Sleeps well. No behavior issue.     There were no vitals filed for this visit.  Physical Exam  GENERAL APPEARANCE: Alert, conversant. Appropriately groomed. No acute distress.  SKIN: No diaphoresis rash, wound finger and buttocks dressed-wounc\d care person said both ere better, buttocks marginally so HEENT- unremarkable  RESPIRATORY: Breathing is even, unlabored. Lung sounds are clear   CARDIOVASCULAR: Heart RRR no murmurs, rubs or gallops. No peripheral edema., BKA ARTERIAL: radial pulse 2+, GASTROINTESTINAL: Abdomen is soft, non-tender, not distended w/ normal bowel sounds.  GENITOURINARY: Bladder non tender, not distended  MUSCULOSKELETAL:BKA bilaterally NEUROLOGIC: no new changes PSYCHIATRIC: pleasant, L side weakness not prominent  Patient Active Problem List   Diagnosis Date Noted  . Osteomyelitis of finger of right hand   . Pressure ulcer of buttock   . 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 Community acquired pneumonia 03/04/2013  . 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 GASTRITIS 01/29/2008  . CHOLELITHIASIS 12/25/2007  . BLOOD IN STOOL-MELENA 12/25/2007  . ABDOMINAL PAIN-LUQ 12/25/2007  . DIABETES MELLITUS 12/21/2007  . DIABETIC PERIPHERAL NEUROPATHY 12/21/2007  . B12 DEFICIENCY 12/21/2007  . HYPERLIPIDEMIA 12/21/2007  . HYPERTENSION 12/21/2007  . CAD 12/21/2007  . CHF 12/21/2007  . PVD 12/21/2007  . CHOLELITHIASIS 12/21/2007  . FOOT ULCER, LEFT 12/21/2007    Functional assessment:   CBC    Component Value Date/Time   WBC 12.2* 03/14/2013 1600   WBC 8.8 02/12/2013 0955   RBC 3.84* 03/14/2013 1600   RBC 4.17* 02/12/2013 0955   HGB 12.6* 03/14/2013 1600   HGB 13.2 02/12/2013 0955   HCT 35.6* 03/14/2013 1600   HCT 39.2 02/12/2013  0955   PLT 270 03/14/2013 1600   PLT 224 02/12/2013 0955   MCV 92.7 03/14/2013 1600   MCV 94.0 02/12/2013 0955   LYMPHSABS 2.1 03/04/2013 1434   LYMPHSABS 2.3 02/12/2013 0955   MONOABS 1.1* 03/04/2013 1434   MONOABS 0.5 02/12/2013 0955   EOSABS 0.2 03/04/2013 1434   EOSABS 0.4 02/12/2013 0955   BASOSABS 0.1 03/04/2013 1434   BASOSABS 0.1 02/12/2013 0955    CMP     Component Value Date/Time   NA 131* 03/14/2013 1600   NA 138 02/12/2013 0955   NA 142  01/26/2012 0931   K 4.4 03/14/2013 1600   K 3.8 02/12/2013 0955   K 5.5* 01/26/2012 0931   CL 99 03/14/2013 1600   CL 107 08/17/2012 1015   CL 102 01/26/2012 0931   CO2 24 03/14/2013 1600   CO2 26 02/12/2013 0955   CO2 29 01/26/2012 0931   GLUCOSE 208* 03/14/2013 1600   GLUCOSE 212* 02/12/2013 0955   GLUCOSE 139* 08/17/2012 1015   GLUCOSE 142* 01/26/2012 0931   BUN 15 03/14/2013 1600   BUN 14.6 02/12/2013 0955   BUN 14 01/26/2012 0931   CREATININE 0.91 03/14/2013 1600   CREATININE 1.1 02/12/2013 0955   CREATININE 1.2 01/26/2012 0931   CALCIUM 8.8 03/14/2013 1600   CALCIUM 8.3* 02/12/2013 0955   CALCIUM 9.1 01/26/2012 0931   PROT 6.2* 02/12/2013 0955   PROT 7.7 01/26/2012 0931   PROT 6.6 12/02/2010 1320   ALBUMIN 2.7* 02/12/2013 0955   ALBUMIN 3.4* 12/02/2010 1320   AST 9 02/12/2013 0955   AST 17 01/26/2012 0931   AST 12 12/02/2010 1320   ALT 8 02/12/2013 0955   ALT 11 01/26/2012 0931   ALT <8 12/02/2010 1320   ALKPHOS 105 02/12/2013 0955   ALKPHOS 112* 01/26/2012 0931   ALKPHOS 101 12/02/2010 1320   BILITOT 0.34 02/12/2013 0955   BILITOT 0.60 01/26/2012 0931   BILITOT 0.4 12/02/2010 1320   GFRNONAA 80* 03/14/2013 1600   GFRAA >90 03/14/2013 1600    Assessment and Plan  Chronic subdural hematoma Presenting with fucntional decline, confusion, iNability to walK and finally L side weakness where he was found to have chronic R side subdural hematoma that was evacuated via Ines Bloomer hole; PT and OT are key and only time will tell whether pt will regain enough function to return home to his wife;pt  needs f/u Ct head in 2-3 weeks; on keppra  Osteomyelitis of finger of right hand Pt is s/p I and D and bony debridement  And txed with rocephin 2 gm IM q 12 hr until 04/21/2013; Dr Amanda Pea wants to see pt week 8/18 for follow up;Dr Daiva Eves wants once weekly CBC and BMP sent to his office [phone];has appt 04/11/2013 with Dr Aram Beecham sny[place], [place], [place]; any questions about abx or PICC go to Dr Daiva Eves  Pressure ulcer of buttock Noted during hospitalization and wound care was consulted because of proximity to rectum;santyl ointment daily and moist dressing; foam dressing to smaller wound; pt is being f/u by wound care here, they may have changed care as needed; they are reported to be improving but slowly;one abrrier has been getting pt to sit part of day instead of lying in bed  HYPERTENSION Controlled; continue present meds  DIABETES MELLITUS Pt on insulin;BS in mid to high 100's; HbA1c-7.8; continue present  HYPERLIPIDEMIA Pt on Zetia and Zocor- will check FLP and CMP  CHF Stable , continue current meds    Margit Hanks, MD

## 2013-04-05 ENCOUNTER — Other Ambulatory Visit: Payer: Self-pay | Admitting: Internal Medicine

## 2013-04-05 ENCOUNTER — Ambulatory Visit (HOSPITAL_COMMUNITY)
Admission: RE | Admit: 2013-04-05 | Discharge: 2013-04-05 | Disposition: A | Discharge status: Home / Self Care | Payer: Medicare Other | Source: Ambulatory Visit | Attending: Internal Medicine | Admitting: Internal Medicine

## 2013-04-05 DIAGNOSIS — M869 Osteomyelitis, unspecified: Secondary | ICD-10-CM

## 2013-04-05 NOTE — Procedures (Signed)
Successful LUE DL POWER PICC TIP SVC/RA NO COMP STABLE READY FOR USE  

## 2013-04-11 ENCOUNTER — Ambulatory Visit (INDEPENDENT_AMBULATORY_CARE_PROVIDER_SITE_OTHER): Payer: Medicare Other | Admitting: Internal Medicine

## 2013-04-11 ENCOUNTER — Encounter: Payer: Self-pay | Admitting: Internal Medicine

## 2013-04-11 VITALS — BP 94/60 | HR 82 | Temp 97.8°F

## 2013-04-11 DIAGNOSIS — M869 Osteomyelitis, unspecified: Secondary | ICD-10-CM

## 2013-04-11 NOTE — Progress Notes (Signed)
[facility] NOTE  RFV: follow up for right middle finger osteomyelitis Subjective:    Patient ID: [name], male    DOB: 03-26-1933, 79 y.o.   MRN: [id number]  HPI 79 yo M with right middle finger osteomyellitis s/p I x D on 8/3 OR cultures showed pansensitive proteus, discharged on 6 wk of ceftriaxone,to finish on 04/21/13. Patient presents on follow up. He is doing well overall. Denies rash, pain with infusion of antibiotics, no diarrhea, no thrush. No erythema to picc line site.  Current Outpatient Prescriptions on File Prior to Visit  Medication Sig Dispense Refill  . carvedilol (COREG) 6.25 MG tablet Take 6.25 mg by mouth daily.      Marland Kitchen ezetimibe (ZETIA) 10 MG tablet Take 10 mg by mouth daily.      Marland Kitchen HYDROcodone-acetaminophen (NORCO/VICODIN) 5-325 MG per tablet Take 1 tablet by mouth every 4 (four) hours as needed for pain.  120 tablet  5  . insulin aspart (NOVOLOG) 100 UNIT/ML injection Inject 4 Units into the skin 3 (three) times daily with meals.      . insulin glargine (LANTUS) 100 UNIT/ML injection Inject 0.14 mL (14 Units total) into the skin daily before breakfast.  10 mL  12  . levETIRAcetam (KEPPRA) 500 MG tablet Take 1 tablet (500 mg total) by mouth 2 (two) times daily.  1 tablet  0  . lisinopril (PRINIVIL,ZESTRIL) 10 MG tablet Take 10 mg by mouth daily.        . simvastatin (ZOCOR) 40 MG tablet Take 40 mg by mouth at bedtime.        . solifenacin (VESICARE) 5 MG tablet Take 5 mg by mouth daily.      . Tamsulosin HCl (FLOMAX) 0.4 MG CAPS Take 0.4 mg by mouth daily.        . collagenase (SANTYL) ointment Apply topically daily.  15 g  0   No current facility-administered medications on file prior to visit.   Active Ambulatory Problems    Diagnosis Date Noted  . DIABETES MELLITUS 12/21/2007  . DIABETIC PERIPHERAL NEUROPATHY 12/21/2007  . B12 DEFICIENCY 12/21/2007  . HYPERLIPIDEMIA 12/21/2007  . HYPERTENSION 12/21/2007  . CAD 12/21/2007  . CHF 12/21/2007  . PVD  12/21/2007  . GASTRITIS 01/29/2008  . CHOLELITHIASIS 12/25/2007  . CHOLELITHIASIS 12/21/2007  . BLOOD IN STOOL-MELENA 12/25/2007  . FOOT ULCER, LEFT 12/21/2007  . ABDOMINAL PAIN-LUQ 12/25/2007  . 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 Community acquired pneumonia 03/04/2013  . Osteomyelitis of finger of right hand   . Pressure ulcer of buttock    Resolved Ambulatory Problems    Diagnosis Date Noted  . No Resolved Ambulatory Problems   Past Medical History  Diagnosis Date  . Diabetes mellitus   . Hypertension   . Testicular cancer dx'd 08/2010  . CHF (congestive heart failure)   . CAD (coronary artery disease)   . Peripheral vascular disease   . Vitamin B12 deficiency   . Diabetic retinopathy   . Diabetic peripheral neuropathy   . Cholelithiasis   . Family history of colon cancer    History  Substance Use Topics  . Smoking status: Former Games developer  . Smokeless tobacco: Never Used     Comment: quit smoking 30 years ago  . Alcohol Use: No  family history includes Colon cancer in his brother.   Review of Systems 12 point ROS is negative except for itchiness to back.    Objective:   Physical Exam  BP 94/60  Pulse 82  Temp(Src) 97.8 F (36.6 C) (Oral) Physical Exam  Constitutional: He is oriented to person, place, and time. He appears well-developed and well-nourished. No distress.  HENT:  Mouth/Throat: Oropharynx is clear and moist. No oropharyngeal exudate.  Cardiovascular: Normal rate, regular rhythm and normal heart sounds. Exam reveals no gallop and no friction rub.  No murmur heard.  Pulmonary/Chest: Effort normal and breath sounds normal. No respiratory distress. He has no wheezes.  Abdominal: Soft. Bowel sounds are normal. He exhibits no distension. There is no tenderness.  Lymphadenopathy:  He has no cervical adenopathy.  Neurological: He is alert and oriented to person, place, and time.  Skin: Skin is warm and dry. No rash noted.  No erythema. Dry patch at bottom of back and flanks.right hand/right middle finger well healed. Psychiatric: He has a normal mood and affect. His behavior is normal.        Assessment & Plan:  Proteus osteo of finger = continue ctx until 9/14. Will ask rehab ctr to check sed rate and crp at that time, pull picc line and switch to bactrim DS 1 tab BID   rtc in 2 wk

## 2013-04-12 ENCOUNTER — Telehealth: Payer: Self-pay | Admitting: *Deleted

## 2013-04-12 NOTE — Telephone Encounter (Signed)
Erica called from patient facility to advise that the patient has pulled out his PICC twice since having it placed initially. They are afraid that he will do it again and he is done with antibiotic 04/21/13 and they want to know if they can just switch the patient to oral. Melida Quitter can ask Dr Drue Second as she is in clinic this morning and call her back.  Asked Dr Drue Second and she advised to switch patient to oral antibiotics. She will call the facility and discuss with nursing staff.

## 2013-04-15 ENCOUNTER — Ambulatory Visit (HOSPITAL_COMMUNITY)
Admission: RE | Admit: 2013-04-15 | Discharge: 2013-04-15 | Disposition: A | Discharge status: Home / Self Care | Payer: Medicare Other | Source: Ambulatory Visit | Attending: Internal Medicine | Admitting: Internal Medicine

## 2013-04-15 DIAGNOSIS — F29 Unspecified psychosis not due to a substance or known physiological condition: Secondary | ICD-10-CM | POA: Insufficient documentation

## 2013-04-15 DIAGNOSIS — Z09 Encounter for follow-up examination after completed treatment for conditions other than malignant neoplasm: Secondary | ICD-10-CM | POA: Insufficient documentation

## 2013-04-15 DIAGNOSIS — I6200 Nontraumatic subdural hemorrhage, unspecified: Secondary | ICD-10-CM | POA: Insufficient documentation

## 2013-04-15 DIAGNOSIS — S065X9A Traumatic subdural hemorrhage with loss of consciousness of unspecified duration, initial encounter: Secondary | ICD-10-CM

## 2013-04-25 ENCOUNTER — Ambulatory Visit (INDEPENDENT_AMBULATORY_CARE_PROVIDER_SITE_OTHER): Payer: Medicare Other | Admitting: Internal Medicine

## 2013-04-25 ENCOUNTER — Encounter: Payer: Self-pay | Admitting: Internal Medicine

## 2013-04-25 VITALS — BP 115/68 | HR 70 | Temp 97.9°F

## 2013-04-25 DIAGNOSIS — Z23 Encounter for immunization: Secondary | ICD-10-CM

## 2013-04-25 DIAGNOSIS — M869 Osteomyelitis, unspecified: Secondary | ICD-10-CM

## 2013-04-25 LAB — CBC WITH DIFFERENTIAL/PLATELET
Basophils Absolute: 0 10*3/uL (ref 0.0–0.1)
Basophils Relative: 0 % (ref 0–1)
Eosinophils Absolute: 0.3 10*3/uL (ref 0.0–0.7)
Eosinophils Relative: 3 % (ref 0–5)
HCT: 39.8 % (ref 39.0–52.0)
Lymphocytes Relative: 32 % (ref 12–46)
MCHC: 35.2 g/dL (ref 30.0–36.0)
MCV: 92.3 fL (ref 78.0–100.0)
Monocytes Absolute: 0.5 10*3/uL (ref 0.1–1.0)
Platelets: 225 10*3/uL (ref 150–400)
RDW: 15.1 % (ref 11.5–15.5)
WBC: 8.5 10*3/uL (ref 4.0–10.5)

## 2013-04-25 NOTE — Progress Notes (Signed)
[facility] NOTE  RFV: follow up for osteomyelitis  Subjective:    Patient ID: [name], male    DOB: 10-02-1932, 79 y.o.   MRN: [id number]  HPI [name], 79yo M with DM, c/b peripheral neuropathy had been diagnosed with right middle finger osteomyelitis s/p I x D. Culture found to have proteus (pan sensitive) given ceftriaxone x 6 wks. To have finished on sept 14th. His picc line was pulled out early on sep 5th, after being previously replaced x 2, thus switched to oral bactrim DS 1 tab BID since 9/5.  No fever, chills.nightsweats or diarrhea  Current Outpatient Prescriptions on File Prior to Visit  Medication Sig Dispense Refill  . carvedilol (COREG) 6.25 MG tablet Take 6.25 mg by mouth daily.      . collagenase (SANTYL) ointment Apply topically daily.  15 g  0  . ezetimibe (ZETIA) 10 MG tablet Take 10 mg by mouth daily.      Marland Kitchen HYDROcodone-acetaminophen (NORCO/VICODIN) 5-325 MG per tablet Take 1 tablet by mouth every 4 (four) hours as needed for pain.  120 tablet  5  . insulin aspart (NOVOLOG) 100 UNIT/ML injection Inject 4 Units into the skin 3 (three) times daily with meals.      . insulin glargine (LANTUS) 100 UNIT/ML injection Inject 0.14 mL (14 Units total) into the skin daily before breakfast.  10 mL  12  . levETIRAcetam (KEPPRA) 500 MG tablet Take 1 tablet (500 mg total) by mouth 2 (two) times daily.  1 tablet  0  . lisinopril (PRINIVIL,ZESTRIL) 10 MG tablet Take 10 mg by mouth daily.        . simvastatin (ZOCOR) 40 MG tablet Take 40 mg by mouth at bedtime.        . solifenacin (VESICARE) 5 MG tablet Take 5 mg by mouth daily.      . Tamsulosin HCl (FLOMAX) 0.4 MG CAPS Take 0.4 mg by mouth daily.         No current facility-administered medications on file prior to visit.   Active Ambulatory Problems    Diagnosis Date Noted  . DIABETES MELLITUS 12/21/2007  . DIABETIC PERIPHERAL NEUROPATHY 12/21/2007  . B12 DEFICIENCY 12/21/2007  . HYPERLIPIDEMIA 12/21/2007  .  HYPERTENSION 12/21/2007  . CAD 12/21/2007  . CHF 12/21/2007  . PVD 12/21/2007  . GASTRITIS 01/29/2008  . CHOLELITHIASIS 12/25/2007  . CHOLELITHIASIS 12/21/2007  . BLOOD IN STOOL-MELENA 12/25/2007  . FOOT ULCER, LEFT 12/21/2007  . ABDOMINAL PAIN-LUQ 12/25/2007  . 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 Community acquired pneumonia 03/04/2013  . Osteomyelitis of finger of right hand   . Pressure ulcer of buttock    Resolved Ambulatory Problems    Diagnosis Date Noted  . No Resolved Ambulatory Problems   Past Medical History  Diagnosis Date  . Diabetes mellitus   . Hypertension   . Testicular cancer dx'd 08/2010  . CHF (congestive heart failure)   . CAD (coronary artery disease)   . Peripheral vascular disease   . Vitamin B12 deficiency   . Diabetic retinopathy   . Diabetic peripheral neuropathy   . Cholelithiasis   . Family history of colon cancer     Soc hx: nursing home resident   Review of Systems Review of Systems  Constitutional: Negative for fever, chills, diaphoresis, activity change, appetite change, fatigue and unexpected weight change.  HENT: Negative for congestion, sore throat, rhinorrhea, sneezing, trouble swallowing and sinus pressure.  Eyes: Negative for photophobia  and visual disturbance.  Respiratory: Negative for cough, chest tightness, shortness of breath, wheezing and stridor.  Cardiovascular: Negative for chest pain, palpitations and leg swelling.  Gastrointestinal: Negative for nausea, vomiting, abdominal pain, diarrhea, constipation, blood in stool, abdominal distention and anal bleeding.  Genitourinary: Negative for dysuria, hematuria, flank pain and difficulty urinating.  Musculoskeletal: Negative for myalgias, back pain, joint swelling, arthralgias and gait problem.  Skin: Negative for color change, pallor, rash and wound.  Neurological: Negative for dizziness, tremors, weakness and light-headedness.  Hematological:  Negative for adenopathy. Does not bruise/bleed easily.  Psychiatric/Behavioral: Negative for behavioral problems, confusion, sleep disturbance, dysphoric mood, decreased concentration and agitation.       Objective:   Physical Exam BP 115/68  Pulse 70  Temp(Src) 97.9 F (36.6 C) (Oral) Physical Exam  Constitutional: He is oriented to person, place, and time. He appears well-developed and well-nourished. No distress. In wheelchair HENT:  Mouth/Throat: Oropharynx is clear and moist. No oropharyngeal exudate.  Cardiovascular: Normal rate, regular rhythm and normal heart sounds. Exam reveals no gallop and no friction rub.  No murmur heard.  Pulmonary/Chest: Effort normal and breath sounds normal. No respiratory distress. He has no wheezes.  Lymphadenopathy:  no cervical adenopathy.  Skin = right finger nail bed slowly growing back to normal. No erythema.         Assessment & Plan:  Right finger osteo = continue on bactrim ds 1 tab bid for now; will check sed rate, crp, bmp ,and cbc. Will call resutls to nursing home to let them know results and when to d/c bactrim  Nursing home is [phone]

## 2013-04-26 LAB — BASIC METABOLIC PANEL
BUN: 14 mg/dL (ref 6–23)
CO2: 22 mEq/L (ref 19–32)
Calcium: 8.5 mg/dL (ref 8.4–10.5)
Creat: 1.01 mg/dL (ref 0.50–1.35)
Glucose, Bld: 114 mg/dL — ABNORMAL HIGH (ref 70–99)
Sodium: 138 mEq/L (ref 135–145)

## 2013-05-09 ENCOUNTER — Encounter: Payer: Self-pay | Admitting: Nurse Practitioner

## 2013-05-09 ENCOUNTER — Non-Acute Institutional Stay (SKILLED_NURSING_FACILITY): Payer: Medicare Other | Admitting: Nurse Practitioner

## 2013-05-09 DIAGNOSIS — L8990 Pressure ulcer of unspecified site, unspecified stage: Secondary | ICD-10-CM

## 2013-05-09 DIAGNOSIS — I10 Essential (primary) hypertension: Secondary | ICD-10-CM

## 2013-05-09 DIAGNOSIS — L89309 Pressure ulcer of unspecified buttock, unspecified stage: Secondary | ICD-10-CM

## 2013-05-09 DIAGNOSIS — M869 Osteomyelitis, unspecified: Secondary | ICD-10-CM

## 2013-05-09 DIAGNOSIS — E119 Type 2 diabetes mellitus without complications: Secondary | ICD-10-CM

## 2013-05-09 NOTE — Progress Notes (Signed)
Patient ID: [name], male   DOB: August 28, 1932, 79 y.o.   MRN: [id number]   PCP: [name], MD   No Known Allergies  Chief Complaint  Patient presents with  . Medical Managment of Chronic Issues    HPI:  Pt at [facility] after hospitalization for large subdural hematoma, and was found to have swelling of the right middle finger therefore was then taken to surgery by Dr. Dominica Severin who found osteomyelitis involving the right middle finger, and performed an I&D and bony debridement. Finger has currently healed and Patient being followed at ID and was given bactrim for 4 weeks.  Pt without complaints and nursing without concerns at this time  Review of Systems:  ROS DATA OBTAINED: from patient,and wife and nursing  GENERAL: Feels well no fevers, fatigue, appetite changes  SKIN: No itching, rash; finger wound well healed and bottom wound  HEENT- no complaints  RESPIRATORY: No cough, wheezing, SOB  CARDIAC: No chest pain, palpitations, lower extremity edema , B BKA  GI: No abdominal pain, No N/V/D or constipation, No heartburn or reflux  GU: No dysuria, frequency or urgency, or incontinence  MUSCULOSKELETAL: No unrelieved bone/joint pain  NEUROLOGIC: left sided weakness from CVA  PSYCHIATRIC: No overt anxiety or sadness. Sleeps well. No behavior issue.    Past Medical History  Diagnosis Date  . Diabetes mellitus   . Hypertension   . Testicular cancer dx'd 08/2010    surg only  . CHF (congestive heart failure)   . CAD (coronary artery disease)   . Peripheral vascular disease   . Vitamin B12 deficiency   . Diabetic retinopathy   . Diabetic peripheral neuropathy   . Cholelithiasis   . Family history of colon cancer     brother   . 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 Osteomyelitis of finger of right hand 03/2013    R middle finger  . Pressure ulcer of buttock 03/2013    stage 2   Past Surgical History  Procedure Laterality Date  . Below knee leg amputation  2005    Right   . Below knee leg amputation  2006    Left  . Appendectomy    . Eye surgery  2012    Laser  . Ptca    . Ines Bloomer hole Right 03/04/2013    Procedure: [name];  Surgeon: Temple Pacini, MD;  Location: [facility];  Service: Neurosurgery;  Laterality: Right;  [name]    Social History:   reports that he has quit smoking. He has never used smokeless tobacco. He reports that he does not drink alcohol or use illicit drugs.  Family History  Problem Relation Age of Onset  . Colon cancer Brother     Medications: Patient's Medications  New Prescriptions   No medications on file  Previous Medications   CARVEDILOL (COREG) 6.25 MG TABLET    Take 6.25 mg by mouth daily.   COLLAGENASE (SANTYL) OINTMENT    Apply topically daily.   EZETIMIBE (ZETIA) 10 MG TABLET    Take 10 mg by mouth daily.   HYDROCODONE-ACETAMINOPHEN (NORCO/VICODIN) 5-325 MG PER TABLET    Take 1 tablet by mouth every 4 (four) hours as needed for pain.   INSULIN ASPART (NOVOLOG) 100 UNIT/ML INJECTION    Inject 4 Units into the skin 3 (three) times daily with meals.   INSULIN GLARGINE (LANTUS) 100 UNIT/ML INJECTION    Inject 0.14 mL (14 Units total) into the skin daily before breakfast.  LEVETIRACETAM (KEPPRA) 500 MG TABLET    Take 1 tablet (500 mg total) by mouth 2 (two) times daily.   LISINOPRIL (PRINIVIL,ZESTRIL) 10 MG TABLET    Take 10 mg by mouth daily.     SIMVASTATIN (ZOCOR) 40 MG TABLET    Take 40 mg by mouth at bedtime.     SOLIFENACIN (VESICARE) 5 MG TABLET    Take 5 mg by mouth daily.   SULFAMETHOXAZOLE-TRIMETHOPRIM (BACTRIM DS) 800-160 MG PER TABLET    Take 1 tablet by mouth 2 (two) times daily. X 4 weeks   TAMSULOSIN HCL (FLOMAX) 0.4 MG CAPS    Take 0.4 mg by mouth daily.    Modified Medications   No medications on file  Discontinued Medications   No medications on file     Physical Exam:  Filed Vitals:   05/09/13 1944  BP: 122/70  Pulse: 64  Temp: 98.4 F (36.9 C)  Resp: 20    GENERAL APPEARANCE:  Alert, conversant. Appropriately groomed. No acute distress.  SKIN: No diaphoresis rash,  buttocks dressed- following with wound care nurse  HEENT- negative  RESPIRATORY: Breathing is even, unlabored. Lung sounds are clear  CARDIOVASCULAR: Heart RRR no murmurs, rubs or gallops. No peripheral edema, BKA  ARTERIAL: radial pulse 2+  GASTROINTESTINAL: Abdomen is soft, non-tender, not distended w/ normal bowel sounds.  GENITOURINARY: Bladder non tender, not distended  MUSCULOSKELETAL:BKA bilaterally; strength in upper extremities has improved  NEUROLOGIC: L sided weakness    Labs reviewed: Basic Metabolic Panel:  Recent Labs  16/10/96 0500 03/14/13 1600 04/25/13 1353  NA 132* 131* 138  K 4.4 4.4 4.7  CL 101 99 108  CO2 25 24 22   GLUCOSE 208* 208* 114*  BUN 15 15 14   CREATININE 0.94 0.91 1.01  CALCIUM 8.8 8.8 8.5   Liver Function Tests:  Recent Labs  08/17/12 1015 02/12/13 0955  AST 6 9  ALT <6 Repeated and Verified 8  ALKPHOS 97 105  BILITOT 0.39 0.34  PROT 6.4 6.2*  ALBUMIN 2.3* 2.7*   No results found for this basename: LIPASE, AMYLASE,  in the last 8760 hours No results found for this basename: AMMONIA,  in the last 8760 hours CBC:  Recent Labs  02/12/13 0955 03/04/13 1434  03/13/13 0500 03/14/13 1600 04/25/13 1353  WBC 8.8 15.5*  < > 10.1 12.2* 8.5  NEUTROABS 5.5 12.0*  --   --   --  5.0  HGB 13.2 13.5  < > 12.2* 12.6* 14.0  HCT 39.2 38.3*  < > 36.1* 35.6* 39.8  MCV 94.0 93.4  < > 93.3 92.7 92.3  PLT 224 293  < > 265 270 225  < > = values in this interval not displayed. Cardiac Enzymes: No results found for this basename: CKTOTAL, CKMB, CKMBINDEX, TROPONINI,  in the last 8760 hours BNP: No components found with this basename: POCBNP,  CBG:  Recent Labs  03/19/13 2123 03/20/13 0645 03/20/13 1138  GLUCAP 151* 136* 170*   Basic Metabolic Panel    Result: 05/02/2013 4:01 PM   ( Status: F )       Sodium 136     135-145 mEq/L SLN   Potassium 5.0      3.5-5.3 mEq/L SLN   Chloride 111     96-112 mEq/L SLN   CO2 19     19-32 mEq/L SLN   Glucose 92     70-99 mg/dL SLN   BUN 12     0-45 mg/dL  SLN   Creatinine 0.84     0.50-1.35 mg/dL SLN   Calcium 8.2   L 8.4-69.6 mg/dL SLN   CBC with Diff    Result: 05/02/2013 2:41 PM   ( Status: F )       WBC 8.6     4.0-10.5 K/uL SLN   RBC 3.99   L 4.22-5.81 MIL/uL SLN   Hemoglobin 12.9   L 13.0-17.0 g/dL SLN   Hematocrit 29.5   L 39.0-52.0 % SLN   MCV 92.0     78.0-100.0 fL SLN   MCH 32.3     26.0-34.0 pg SLN   MCHC 35.1     30.0-36.0 g/dL SLN   RDW 28.4     13.2-44.0 % SLN   Platelet Count 167     150-400 K/uL SLN   Granulocyte % 56     43-77 % SLN   Absolute Gran 4.9     1.7-7.7 K/uL SLN   Lymph % 30     12-46 % SLN   Absolute Lymph 2.6     0.7-4.0 K/uL SLN   Mono % 9     3-12 % SLN   Absolute Mono 0.7     0.1-1.0 K/uL SLN   Eos % 5     0-5 % SLN   Absolute Eos 0.5     0.0-0.7 K/uL SLN   Baso % 0     0-1 % SLN   Absolute Baso 0.0     Assessment/Plan 1. HYPERTENSION Patient is stable; continue current regimen. Will monitor and make changes as necessary.  2. DIABETES MELLITUS Stable; Will cont current current insulin regimen at this time  3. Pressure ulcer of buttock, unspecified laterality, unspecified pressure ulcer stage conts to be followed by wound care nurse and doctor  4. Osteomyelitis of finger of right hand Stable; well healed; following with ID

## 2013-06-10 ENCOUNTER — Non-Acute Institutional Stay (SKILLED_NURSING_FACILITY): Payer: Medicare Other | Admitting: Internal Medicine

## 2013-06-10 ENCOUNTER — Encounter: Payer: Self-pay | Admitting: Internal Medicine

## 2013-06-10 DIAGNOSIS — I2510 Atherosclerotic heart disease of native coronary artery without angina pectoris: Secondary | ICD-10-CM

## 2013-06-10 DIAGNOSIS — E1149 Type 2 diabetes mellitus with other diabetic neurological complication: Secondary | ICD-10-CM

## 2013-06-10 DIAGNOSIS — E119 Type 2 diabetes mellitus without complications: Secondary | ICD-10-CM

## 2013-06-10 DIAGNOSIS — I509 Heart failure, unspecified: Secondary | ICD-10-CM

## 2013-06-10 DIAGNOSIS — I6203 Nontraumatic chronic subdural hemorrhage: Secondary | ICD-10-CM

## 2013-06-10 DIAGNOSIS — I10 Essential (primary) hypertension: Secondary | ICD-10-CM

## 2013-06-10 DIAGNOSIS — E785 Hyperlipidemia, unspecified: Secondary | ICD-10-CM

## 2013-06-10 DIAGNOSIS — I6200 Nontraumatic subdural hemorrhage, unspecified: Secondary | ICD-10-CM

## 2013-06-10 DIAGNOSIS — I739 Peripheral vascular disease, unspecified: Secondary | ICD-10-CM

## 2013-06-10 NOTE — Progress Notes (Signed)
MRN: [id number] Name: [name]  Sex: male Age: 79 y.o. DOB: 08-09-1932  PSC #: [name] Facility/Room: 223 Level Of Care: SNF Provider: Merrilee Seashore D Emergency Contacts: Extended Emergency Contact Information Primary Emergency Contact: Haden,Alice Address: [address]          [place], [place] [address] [place] Home Phone: [phone] Relation: Spouse Secondary Emergency Contact: Joneen Caraway [place] Home Phone: [phone] Relation: Son  Code Status: FULL  Allergies: Review of patient's allergies indicates no known allergies.  Chief Complaint  Patient presents with  . Discharge Note    HPI: Patient is 79 y.o. male who who fell and sustained a subdural hematoma and after working with OT/PT/ST is ready to go home.  Past Medical History  Diagnosis Date  . Diabetes mellitus   . Hypertension   . Testicular cancer dx'd 08/2010    surg only  . CHF (congestive heart failure)   . CAD (coronary artery disease)   . Peripheral vascular disease   . Vitamin B12 deficiency   . Diabetic retinopathy   . Diabetic peripheral neuropathy   . Cholelithiasis   . Family history of colon cancer     brother   . 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 Osteomyelitis of finger of right hand 03/2013    R middle finger  . Pressure ulcer of buttock 03/2013    stage 2    Past Surgical History  Procedure Laterality Date  . Below knee leg amputation  2005    Right  . Below knee leg amputation  2006    Left  . Appendectomy    . Eye surgery  2012    Laser  . Ptca    . Ines Bloomer hole Right 03/04/2013    Procedure: [name];  Surgeon: Temple Pacini, MD;  Location: [facility];  Service: Neurosurgery;  Laterality: Right;  [name]       Medication List       This list is accurate as of: 06/10/13  5:17 PM.  Always use your most recent med list.               carvedilol 6.25 MG tablet  Commonly known as:  COREG  Take 6.25 mg by mouth daily.     diphenhydrAMINE 25 mg capsule  Commonly known as:  BENADRYL  Take 25 mg by mouth every 8 (eight) hours as needed for itching.     ezetimibe 10 MG tablet  Commonly known as:  ZETIA  Take 10 mg by mouth daily.     HYDROcodone-acetaminophen 5-325 MG per tablet  Commonly known as:  NORCO/VICODIN  Take 1 tablet by mouth every 4 (four) hours as needed for pain.     insulin aspart 100 UNIT/ML injection  Commonly known as:  novoLOG  Inject 4 Units into the skin 3 (three) times daily with meals.     insulin glargine 100 UNIT/ML injection  Commonly known as:  LANTUS  Inject 0.14 mL (14 Units total) into the skin daily before breakfast.     levETIRAcetam 500 MG tablet  Commonly known as:  KEPPRA  Take 1 tablet (500 mg total) by mouth 2 (two) times daily.     lisinopril 10 MG tablet  Commonly known as:  PRINIVIL,ZESTRIL  Take 10 mg by mouth daily.     simvastatin 40 MG tablet  Commonly known as:  ZOCOR  Take 40 mg by mouth at bedtime.     solifenacin 5  MG tablet  Commonly known as:  VESICARE  Take 5 mg by mouth daily.     tamsulosin 0.4 MG Caps capsule  Commonly known as:  FLOMAX  Take 0.4 mg by mouth daily.        Meds ordered this encounter  Medications  . diphenhydrAMINE (BENADRYL) 25 mg capsule    Sig: Take 25 mg by mouth every 8 (eight) hours as needed for itching.    Immunization History  Administered Date(s) Administered  . Influenza,inj,Quad PF,36+ Mos 04/25/2013    History  Substance Use Topics  . Smoking status: Former Games developer  . Smokeless tobacco: Never Used     Comment: quit smoking 30 years ago  . Alcohol Use: No     Filed Vitals:   06/10/13 1708  BP: 106/63  Pulse: 74  Temp: 98.7 F (37.1 C)  Resp: 18    Physical Exam  GENERAL APPEARANCE: Alert, conversant. Appropriately groomed. No acute distress.  SKIN: all wounds are healed HEENT-unremarkable RESPIRATORY: Breathing is even, unlabored. Lung sounds are clear   CARDIOVASCULAR: Heart RRR no  murmurs, rubs or gallops. No peripheral edema.   GASTROINTESTINAL: Abdomen is soft, non-tender, not distended w/ normal bowel sounds MUSCULOSKELETAL: BKA B NEUROLOGIC: Oriented X3. Cranial nerves 2-12 grossly intact. Moves all extremities no tremor. PSYCHIATRIC: Mood and affect appropriate to situation, no behavioral issues  Patient Active Problem List   Diagnosis Date Noted  . Osteomyelitis of finger of right hand   . Pressure ulcer of buttock   . 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 Community acquired pneumonia 03/04/2013  . 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 GASTRITIS 01/29/2008  . CHOLELITHIASIS 12/25/2007  . BLOOD IN STOOL-MELENA 12/25/2007  . ABDOMINAL PAIN-LUQ 12/25/2007  . DIABETES MELLITUS 12/21/2007  . DIABETIC PERIPHERAL NEUROPATHY 12/21/2007  . B12 DEFICIENCY 12/21/2007  . HYPERLIPIDEMIA 12/21/2007  . HYPERTENSION 12/21/2007  . CAD 12/21/2007  . CHF 12/21/2007  . PVD 12/21/2007  . CHOLELITHIASIS 12/21/2007  . FOOT ULCER, LEFT 12/21/2007    CBC    Component Value Date/Time   WBC 8.5 04/25/2013 1353   WBC 8.8 02/12/2013 0955   RBC 4.31 04/25/2013 1353   RBC 4.17* 02/12/2013 0955   HGB 14.0 04/25/2013 1353   HGB 13.2 02/12/2013 0955   HCT 39.8 04/25/2013 1353   HCT 39.2 02/12/2013 0955   PLT 225 04/25/2013 1353   PLT 224 02/12/2013 0955   MCV 92.3 04/25/2013 1353   MCV 94.0 02/12/2013 0955   LYMPHSABS 2.7 04/25/2013 1353   LYMPHSABS 2.3 02/12/2013 0955   MONOABS 0.5 04/25/2013 1353   MONOABS 0.5 02/12/2013 0955   EOSABS 0.3 04/25/2013 1353   EOSABS 0.4 02/12/2013 0955   BASOSABS 0.0 04/25/2013 1353   BASOSABS 0.1 02/12/2013 0955    CMP     Component Value Date/Time   NA 138 04/25/2013 1353   NA 138 02/12/2013 0955   NA 142 01/26/2012 0931   K 4.7 04/25/2013 1353   K 3.8 02/12/2013 0955   K 5.5* 01/26/2012 0931   CL 108 04/25/2013 1353   CL 107 08/17/2012 1015   CL 102 01/26/2012 0931   CO2 22 04/25/2013 1353   CO2 26 02/12/2013 0955   CO2 29 01/26/2012 0931   GLUCOSE 114* 04/25/2013 1353    GLUCOSE 212* 02/12/2013 0955   GLUCOSE 139* 08/17/2012 1015   GLUCOSE 142* 01/26/2012 0931   BUN 14 04/25/2013 1353   BUN 14.6 02/12/2013 0955   BUN 14 01/26/2012 0931   CREATININE 1.01  04/25/2013 1353   CREATININE 0.91 03/14/2013 1600   CREATININE 1.1 02/12/2013 0955   CALCIUM 8.5 04/25/2013 1353   CALCIUM 8.3* 02/12/2013 0955   CALCIUM 9.1 01/26/2012 0931   PROT 6.2* 02/12/2013 0955   PROT 7.7 01/26/2012 0931   PROT 6.6 12/02/2010 1320   ALBUMIN 2.7* 02/12/2013 0955   ALBUMIN 3.4* 12/02/2010 1320   AST 9 02/12/2013 0955   AST 17 01/26/2012 0931   AST 12 12/02/2010 1320   ALT 8 02/12/2013 0955   ALT 11 01/26/2012 0931   ALT <8 12/02/2010 1320   ALKPHOS 105 02/12/2013 0955   ALKPHOS 112* 01/26/2012 0931   ALKPHOS 101 12/02/2010 1320   BILITOT 0.34 02/12/2013 0955   BILITOT 0.60 01/26/2012 0931   BILITOT 0.4 12/02/2010 1320   GFRNONAA 80* 03/14/2013 1600   GFRAA >90 03/14/2013 1600    Assessment and Plan  Patient is stable on all his medications and has progressed in therapy to be able to go safely home to a care taker.  Margit Hanks, MD

## 2013-06-12 DIAGNOSIS — M86649 Other chronic osteomyelitis, unspecified hand: Secondary | ICD-10-CM

## 2013-06-12 DIAGNOSIS — E1169 Type 2 diabetes mellitus with other specified complication: Secondary | ICD-10-CM

## 2013-06-12 DIAGNOSIS — M6281 Muscle weakness (generalized): Secondary | ICD-10-CM

## 2013-06-12 DIAGNOSIS — M9080 Osteopathy in diseases classified elsewhere, unspecified site: Secondary | ICD-10-CM

## 2013-12-16 ENCOUNTER — Encounter (HOSPITAL_COMMUNITY): Payer: Self-pay | Admitting: Emergency Medicine

## 2013-12-16 ENCOUNTER — Inpatient Hospital Stay (HOSPITAL_COMMUNITY)
Admission: EM | Admit: 2013-12-16 | Discharge: 2013-12-25 | DRG: 064 | Disposition: A | Discharge status: Skilled Nursing Facility | Payer: Medicare Other | Attending: Internal Medicine | Admitting: Internal Medicine

## 2013-12-16 ENCOUNTER — Inpatient Hospital Stay (HOSPITAL_COMMUNITY): Payer: Medicare Other

## 2013-12-16 ENCOUNTER — Emergency Department (HOSPITAL_COMMUNITY): Payer: Medicare Other

## 2013-12-16 DIAGNOSIS — C499 Malignant neoplasm of connective and soft tissue, unspecified: Secondary | ICD-10-CM

## 2013-12-16 DIAGNOSIS — I255 Ischemic cardiomyopathy: Secondary | ICD-10-CM

## 2013-12-16 DIAGNOSIS — L97909 Non-pressure chronic ulcer of unspecified part of unspecified lower leg with unspecified severity: Secondary | ICD-10-CM

## 2013-12-16 DIAGNOSIS — I2589 Other forms of chronic ischemic heart disease: Secondary | ICD-10-CM | POA: Diagnosis present

## 2013-12-16 DIAGNOSIS — E1139 Type 2 diabetes mellitus with other diabetic ophthalmic complication: Secondary | ICD-10-CM | POA: Diagnosis present

## 2013-12-16 DIAGNOSIS — Z794 Long term (current) use of insulin: Secondary | ICD-10-CM

## 2013-12-16 DIAGNOSIS — W19XXXA Unspecified fall, initial encounter: Secondary | ICD-10-CM

## 2013-12-16 DIAGNOSIS — K8020 Calculus of gallbladder without cholecystitis without obstruction: Secondary | ICD-10-CM

## 2013-12-16 DIAGNOSIS — Y92009 Unspecified place in unspecified non-institutional (private) residence as the place of occurrence of the external cause: Secondary | ICD-10-CM

## 2013-12-16 DIAGNOSIS — R4182 Altered mental status, unspecified: Secondary | ICD-10-CM

## 2013-12-16 DIAGNOSIS — Z89512 Acquired absence of left leg below knee: Secondary | ICD-10-CM

## 2013-12-16 DIAGNOSIS — G9340 Encephalopathy, unspecified: Secondary | ICD-10-CM | POA: Diagnosis present

## 2013-12-16 DIAGNOSIS — K2970 Gastritis, unspecified, without bleeding: Secondary | ICD-10-CM

## 2013-12-16 DIAGNOSIS — E538 Deficiency of other specified B group vitamins: Secondary | ICD-10-CM

## 2013-12-16 DIAGNOSIS — I6529 Occlusion and stenosis of unspecified carotid artery: Secondary | ICD-10-CM | POA: Diagnosis present

## 2013-12-16 DIAGNOSIS — Z89511 Acquired absence of right leg below knee: Secondary | ICD-10-CM

## 2013-12-16 DIAGNOSIS — R1012 Left upper quadrant pain: Secondary | ICD-10-CM

## 2013-12-16 DIAGNOSIS — I5022 Chronic systolic (congestive) heart failure: Secondary | ICD-10-CM | POA: Diagnosis present

## 2013-12-16 DIAGNOSIS — Z8673 Personal history of transient ischemic attack (TIA), and cerebral infarction without residual deficits: Secondary | ICD-10-CM

## 2013-12-16 DIAGNOSIS — R4781 Slurred speech: Secondary | ICD-10-CM

## 2013-12-16 DIAGNOSIS — M869 Osteomyelitis, unspecified: Secondary | ICD-10-CM

## 2013-12-16 DIAGNOSIS — I10 Essential (primary) hypertension: Secondary | ICD-10-CM

## 2013-12-16 DIAGNOSIS — I739 Peripheral vascular disease, unspecified: Secondary | ICD-10-CM

## 2013-12-16 DIAGNOSIS — W06XXXA Fall from bed, initial encounter: Secondary | ICD-10-CM | POA: Diagnosis present

## 2013-12-16 DIAGNOSIS — L89309 Pressure ulcer of unspecified buttock, unspecified stage: Secondary | ICD-10-CM

## 2013-12-16 DIAGNOSIS — E785 Hyperlipidemia, unspecified: Secondary | ICD-10-CM

## 2013-12-16 DIAGNOSIS — W1789XA Other fall from one level to another, initial encounter: Secondary | ICD-10-CM

## 2013-12-16 DIAGNOSIS — K921 Melena: Secondary | ICD-10-CM

## 2013-12-16 DIAGNOSIS — Z79899 Other long term (current) drug therapy: Secondary | ICD-10-CM

## 2013-12-16 DIAGNOSIS — I2510 Atherosclerotic heart disease of native coronary artery without angina pectoris: Secondary | ICD-10-CM

## 2013-12-16 DIAGNOSIS — I6203 Nontraumatic chronic subdural hemorrhage: Secondary | ICD-10-CM

## 2013-12-16 DIAGNOSIS — E11319 Type 2 diabetes mellitus with unspecified diabetic retinopathy without macular edema: Secondary | ICD-10-CM | POA: Diagnosis present

## 2013-12-16 DIAGNOSIS — I639 Cerebral infarction, unspecified: Secondary | ICD-10-CM

## 2013-12-16 DIAGNOSIS — E1149 Type 2 diabetes mellitus with other diabetic neurological complication: Secondary | ICD-10-CM

## 2013-12-16 DIAGNOSIS — K2990 Gastroduodenitis, unspecified, without bleeding: Secondary | ICD-10-CM

## 2013-12-16 DIAGNOSIS — R29898 Other symptoms and signs involving the musculoskeletal system: Secondary | ICD-10-CM

## 2013-12-16 DIAGNOSIS — I63239 Cerebral infarction due to unspecified occlusion or stenosis of unspecified carotid arteries: Secondary | ICD-10-CM

## 2013-12-16 DIAGNOSIS — I509 Heart failure, unspecified: Secondary | ICD-10-CM

## 2013-12-16 DIAGNOSIS — I6350 Cerebral infarction due to unspecified occlusion or stenosis of unspecified cerebral artery: Principal | ICD-10-CM | POA: Diagnosis present

## 2013-12-16 DIAGNOSIS — Z993 Dependence on wheelchair: Secondary | ICD-10-CM

## 2013-12-16 DIAGNOSIS — Z8547 Personal history of malignant neoplasm of testis: Secondary | ICD-10-CM

## 2013-12-16 DIAGNOSIS — Z8589 Personal history of malignant neoplasm of other organs and systems: Secondary | ICD-10-CM

## 2013-12-16 DIAGNOSIS — G40909 Epilepsy, unspecified, not intractable, without status epilepticus: Secondary | ICD-10-CM | POA: Diagnosis present

## 2013-12-16 DIAGNOSIS — Z87891 Personal history of nicotine dependence: Secondary | ICD-10-CM

## 2013-12-16 DIAGNOSIS — R4701 Aphasia: Secondary | ICD-10-CM | POA: Diagnosis present

## 2013-12-16 DIAGNOSIS — E1142 Type 2 diabetes mellitus with diabetic polyneuropathy: Secondary | ICD-10-CM | POA: Diagnosis present

## 2013-12-16 DIAGNOSIS — J189 Pneumonia, unspecified organism: Secondary | ICD-10-CM

## 2013-12-16 DIAGNOSIS — E119 Type 2 diabetes mellitus without complications: Secondary | ICD-10-CM

## 2013-12-16 DIAGNOSIS — Z9861 Coronary angioplasty status: Secondary | ICD-10-CM

## 2013-12-16 DIAGNOSIS — S88119A Complete traumatic amputation at level between knee and ankle, unspecified lower leg, initial encounter: Secondary | ICD-10-CM

## 2013-12-16 LAB — CBC
HEMATOCRIT: 41.3 % (ref 39.0–52.0)
Hemoglobin: 13.6 g/dL (ref 13.0–17.0)
MCH: 31.8 pg (ref 26.0–34.0)
MCHC: 32.9 g/dL (ref 30.0–36.0)
MCV: 96.5 fL (ref 78.0–100.0)
PLATELETS: 180 10*3/uL (ref 150–400)
RBC: 4.28 MIL/uL (ref 4.22–5.81)
RDW: 14.1 % (ref 11.5–15.5)
WBC: 9 10*3/uL (ref 4.0–10.5)

## 2013-12-16 LAB — URINALYSIS, ROUTINE W REFLEX MICROSCOPIC
Bilirubin Urine: NEGATIVE
Glucose, UA: NEGATIVE mg/dL
Ketones, ur: NEGATIVE mg/dL
LEUKOCYTES UA: NEGATIVE
Nitrite: NEGATIVE
PROTEIN: NEGATIVE mg/dL
Specific Gravity, Urine: 1.022 (ref 1.005–1.030)
UROBILINOGEN UA: 1 mg/dL (ref 0.0–1.0)
pH: 5.5 (ref 5.0–8.0)

## 2013-12-16 LAB — COMPREHENSIVE METABOLIC PANEL
ALBUMIN: 3 g/dL — AB (ref 3.5–5.2)
ALT: 8 U/L (ref 0–53)
AST: 11 U/L (ref 0–37)
Alkaline Phosphatase: 113 U/L (ref 39–117)
BILIRUBIN TOTAL: 0.5 mg/dL (ref 0.3–1.2)
BUN: 28 mg/dL — ABNORMAL HIGH (ref 6–23)
CALCIUM: 8.6 mg/dL (ref 8.4–10.5)
CO2: 19 meq/L (ref 19–32)
CREATININE: 1.1 mg/dL (ref 0.50–1.35)
Chloride: 109 mEq/L (ref 96–112)
GFR calc Af Amer: 72 mL/min — ABNORMAL LOW (ref >90)
GFR, EST NON AFRICAN AMERICAN: 62 mL/min — AB (ref >90)
Glucose, Bld: 157 mg/dL — ABNORMAL HIGH (ref 70–99)
Potassium: 4.4 mEq/L (ref 3.7–5.3)
Sodium: 143 mEq/L (ref 137–147)
Total Protein: 6.9 g/dL (ref 6.0–8.3)

## 2013-12-16 LAB — URINE MICROSCOPIC-ADD ON

## 2013-12-16 LAB — GLUCOSE, CAPILLARY
Glucose-Capillary: 192 mg/dL — ABNORMAL HIGH (ref 70–99)
Glucose-Capillary: 177 mg/dL — ABNORMAL HIGH (ref 70–99)

## 2013-12-16 LAB — HEMOGLOBIN A1C
Hgb A1c MFr Bld: 7.2 % — ABNORMAL HIGH (ref <5.7)
Mean Plasma Glucose: 160 mg/dL — ABNORMAL HIGH (ref <117)

## 2013-12-16 LAB — CBG MONITORING, ED: Glucose-Capillary: 135 mg/dL — ABNORMAL HIGH (ref 70–99)

## 2013-12-16 MED ORDER — ACETAMINOPHEN 325 MG PO TABS
650.0000 mg | ORAL_TABLET | ORAL | Status: DC | PRN
  Administered 2013-12-21: 650 mg via ORAL
  Filled 2013-12-16: qty 2

## 2013-12-16 MED ORDER — INSULIN ASPART 100 UNIT/ML ~~LOC~~ SOLN
0.0000 [IU] | Freq: Three times a day (TID) | SUBCUTANEOUS | Status: DC
  Administered 2013-12-16: 2 [IU] via SUBCUTANEOUS
  Administered 2013-12-17 (×2): 3 [IU] via SUBCUTANEOUS
  Administered 2013-12-18 – 2013-12-19 (×4): 2 [IU] via SUBCUTANEOUS
  Administered 2013-12-19 – 2013-12-20 (×2): 1 [IU] via SUBCUTANEOUS
  Administered 2013-12-21 – 2013-12-22 (×4): 2 [IU] via SUBCUTANEOUS
  Administered 2013-12-22: 3 [IU] via SUBCUTANEOUS
  Administered 2013-12-23: 2 [IU] via SUBCUTANEOUS
  Administered 2013-12-23: 1 [IU] via SUBCUTANEOUS
  Administered 2013-12-23: 3 [IU] via SUBCUTANEOUS
  Administered 2013-12-24 – 2013-12-25 (×3): 2 [IU] via SUBCUTANEOUS

## 2013-12-16 MED ORDER — ASPIRIN 325 MG PO TABS
325.0000 mg | ORAL_TABLET | Freq: Every day | ORAL | Status: DC
  Administered 2013-12-16 – 2013-12-19 (×4): 325 mg via ORAL
  Filled 2013-12-16 (×6): qty 1

## 2013-12-16 MED ORDER — DIPHENHYDRAMINE HCL 25 MG PO CAPS: 25.0000 mg | ORAL_CAPSULE | Freq: Three times a day (TID) | ORAL | Status: DC | PRN

## 2013-12-16 MED ORDER — EZETIMIBE 10 MG PO TABS
10.0000 mg | ORAL_TABLET | Freq: Every day | ORAL | Status: DC
  Administered 2013-12-16 – 2013-12-25 (×8): 10 mg via ORAL
  Filled 2013-12-16 (×10): qty 1

## 2013-12-16 MED ORDER — SIMVASTATIN 40 MG PO TABS
40.0000 mg | ORAL_TABLET | Freq: Every day | ORAL | Status: DC
  Administered 2013-12-17 – 2013-12-23 (×7): 40 mg via ORAL
  Filled 2013-12-16 (×10): qty 1

## 2013-12-16 MED ORDER — TAMSULOSIN HCL 0.4 MG PO CAPS
0.4000 mg | ORAL_CAPSULE | Freq: Every day | ORAL | Status: DC
  Administered 2013-12-16 – 2013-12-25 (×8): 0.4 mg via ORAL
  Filled 2013-12-16 (×10): qty 1

## 2013-12-16 MED ORDER — LEVETIRACETAM 500 MG PO TABS
500.0000 mg | ORAL_TABLET | Freq: Two times a day (BID) | ORAL | Status: DC
  Administered 2013-12-16 – 2013-12-25 (×14): 500 mg via ORAL
  Filled 2013-12-16 (×20): qty 1

## 2013-12-16 MED ORDER — DARIFENACIN HYDROBROMIDE ER 7.5 MG PO TB24
7.5000 mg | ORAL_TABLET | Freq: Every day | ORAL | Status: DC
  Administered 2013-12-16 – 2013-12-25 (×8): 7.5 mg via ORAL
  Filled 2013-12-16 (×10): qty 1

## 2013-12-16 MED ORDER — SODIUM CHLORIDE 0.9 % IV SOLN
INTRAVENOUS | Status: DC
  Administered 2013-12-16 – 2013-12-17 (×2): via INTRAVENOUS

## 2013-12-16 MED ORDER — SENNOSIDES-DOCUSATE SODIUM 8.6-50 MG PO TABS: 1.0000 | ORAL_TABLET | Freq: Every evening | ORAL | Status: DC | PRN

## 2013-12-16 MED ORDER — INSULIN ASPART 100 UNIT/ML ~~LOC~~ SOLN: 0.0000 [IU] | Freq: Every day | SUBCUTANEOUS | Status: DC

## 2013-12-16 MED ORDER — INSULIN GLARGINE 100 UNIT/ML ~~LOC~~ SOLN: 8.0000 [IU] | Freq: Every day | SUBCUTANEOUS | Status: DC

## 2013-12-16 MED ORDER — ASPIRIN 300 MG RE SUPP
300.0000 mg | Freq: Every day | RECTAL | Status: DC
  Administered 2013-12-20: 300 mg via RECTAL
  Filled 2013-12-16 (×5): qty 1

## 2013-12-16 MED ORDER — ENOXAPARIN SODIUM 40 MG/0.4ML ~~LOC~~ SOLN
40.0000 mg | SUBCUTANEOUS | Status: DC
  Administered 2013-12-16 – 2013-12-20 (×5): 40 mg via SUBCUTANEOUS
  Filled 2013-12-16 (×6): qty 0.4

## 2013-12-16 MED ORDER — INSULIN ASPART 100 UNIT/ML ~~LOC~~ SOLN: 0.0000 [IU] | SUBCUTANEOUS | Status: DC

## 2013-12-16 MED ORDER — INSULIN GLARGINE 100 UNIT/ML ~~LOC~~ SOLN
8.0000 [IU] | Freq: Every day | SUBCUTANEOUS | Status: DC
  Administered 2013-12-17 – 2013-12-25 (×7): 8 [IU] via SUBCUTANEOUS
  Filled 2013-12-16 (×11): qty 0.08

## 2013-12-16 MED ORDER — ACETAMINOPHEN 650 MG RE SUPP: 650.0000 mg | RECTAL | Status: DC | PRN

## 2013-12-16 NOTE — ED Notes (Signed)
In and Out attempted unable to pass catheter through prostate

## 2013-12-16 NOTE — ED Provider Notes (Signed)
CSN: [id number]     Arrival date & time 12/16/13  0813 History   First MD Initiated Contact with Patient 12/16/13 [phone]     Chief Complaint  Patient presents with  . Altered Mental Status   In addition to what is listed below.  Patient is a 80 y.o. male with PMH of prior right sided SDH, DM, HTN, testicular cancer, CAD, and CHF who presents with AMS endorsed by the family.  Patient went to bed last night around 9 pm.  He was found on the floor, next to bed this morning at 7 AM, and was noted to be confused.  Wife reports that patient was in normal state of health last night prior to going to bed, but this morning he is confused.  She also reports patient's speech is difficult to understand and patient not moving his right side as much as he normally does.  Patient currently has no complaints.     Patient is a 80 y.o. male presenting with fall. The history is provided by the spouse, the patient, the EMS personnel and medical records. No language interpreter was used.  Fall This is a new problem. The current episode started today. The problem has been unchanged. Pertinent negatives include no abdominal pain, chest pain, coughing, fever, neck pain or sore throat. Nothing aggravates the symptoms. He has tried nothing for the symptoms. The treatment provided no relief.    Past Medical History  Diagnosis Date  . Diabetes mellitus   . Hypertension   . Testicular cancer dx'd 08/2010    surg only  . CHF (congestive heart failure)   . CAD (coronary artery disease)   . Peripheral vascular disease   . Vitamin B12 deficiency   . Diabetic retinopathy   . Diabetic peripheral neuropathy   . Cholelithiasis   . Family history of colon cancer     brother   . 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 Osteomyelitis of finger of right hand 03/2013    R middle finger  . Pressure ulcer of buttock 03/2013    stage 2   Past Surgical History  Procedure Laterality Date  . Below knee leg amputation  2005    Right  . Below knee  leg amputation  2006    Left  . Appendectomy    . Eye surgery  2012    Laser  . Ptca    . Trudee Kuster hole Right 03/04/2013    Procedure: Haskell Flirt;  Surgeon: Charlie Pitter, MD;  Location: [facility];  Service: Neurosurgery;  Laterality: Right;  Haskell Flirt    Family History  Problem Relation Age of Onset  . Colon cancer Brother    History  Substance Use Topics  . Smoking status: Former Research scientist (life sciences)  . Smokeless tobacco: Never Used     Comment: quit smoking 30 years ago  . Alcohol Use: No    Review of Systems  Constitutional: Negative for fever.  HENT: Negative for sore throat.   Respiratory: Negative for cough.   Cardiovascular: Negative for chest pain.  Gastrointestinal: Negative for abdominal pain.  Musculoskeletal: Negative for neck pain.  All other systems reviewed and are negative.     Allergies  Review of patient's allergies indicates no known allergies.  Home Medications   Prior to Admission medications   Medication Sig Start Date End Date Taking? Authorizing Provider  carvedilol (COREG) 6.25 MG tablet Take 6.25 mg by mouth daily.    Historical Provider, MD  diphenhydrAMINE (BENADRYL) 25 mg  capsule Take 25 mg by mouth every 8 (eight) hours as needed for itching.    Historical Provider, MD  ezetimibe (ZETIA) 10 MG tablet Take 10 mg by mouth daily.    Historical Provider, MD  HYDROcodone-acetaminophen (NORCO/VICODIN) 5-325 MG per tablet Take 1 tablet by mouth every 4 (four) hours as needed for pain. 03/21/13   Tiffany L Reed, DO  insulin aspart (NOVOLOG) 100 UNIT/ML injection Inject 4 Units into the skin 3 (three) times daily with meals.    Historical Provider, MD  insulin glargine (LANTUS) 100 UNIT/ML injection Inject 0.14 mL (14 Units total) into the skin daily before breakfast. 03/20/13   Hosie Spangle, MD  levETIRAcetam (KEPPRA) 500 MG tablet Take 1 tablet (500 mg total) by mouth 2 (two) times daily. 03/20/13   Hosie Spangle, MD  lisinopril (PRINIVIL,ZESTRIL) 10 MG  tablet Take 10 mg by mouth daily.      Historical Provider, MD  simvastatin (ZOCOR) 40 MG tablet Take 40 mg by mouth at bedtime.      Historical Provider, MD  solifenacin (VESICARE) 5 MG tablet Take 5 mg by mouth daily.    Historical Provider, MD  Tamsulosin HCl (FLOMAX) 0.4 MG CAPS Take 0.4 mg by mouth daily.      Historical Provider, MD   BP 97/45  Pulse 80  Temp(Src) 98.9 F (37.2 C) (Oral)  Resp 21  SpO2 92% Physical Exam  Nursing note and vitals reviewed. Constitutional: He appears well-developed and well-nourished. No distress.  HENT:  Head: Normocephalic and atraumatic.  Right Ear: External ear normal.  Left Ear: External ear normal.  Mouth/Throat: Oropharynx is clear and moist.  Eyes: Conjunctivae are normal. Pupils are equal, round, and reactive to light.  Neck: Normal range of motion. Neck supple.  Cardiovascular: Normal rate and normal heart sounds.   Pulmonary/Chest: Effort normal and breath sounds normal.  Abdominal: Soft. Bowel sounds are normal.  Musculoskeletal: He exhibits no edema and no tenderness.  Bilateral BKA presents with no edema or TTP.  Skin is intact and no defects noted.    Neurological: He is alert. He has normal reflexes. He displays normal reflexes. No cranial nerve deficit or sensory deficit. He exhibits normal muscle tone. GCS eye subscore is 4. GCS verbal subscore is 5. GCS motor subscore is 6.  Oriented only to person; patient not able to grip with right hand or hold right arm above head. Mild dysarthria also present.       ED Course  Procedures (including critical care time) Labs Review Labs Reviewed  COMPREHENSIVE METABOLIC PANEL - Abnormal; Notable for the following:    Glucose, Bld 157 (*)    BUN 28 (*)    Albumin 3.0 (*)    GFR calc non Af Amer 62 (*)    GFR calc Af Amer 72 (*)    All other components within normal limits  CBG MONITORING, ED - Abnormal; Notable for the following:    Glucose-Capillary 135 (*)    All other components  within normal limits  CBC  URINALYSIS, ROUTINE W REFLEX MICROSCOPIC    Imaging Review Ct Head Wo Contrast  12/16/2013   CLINICAL DATA:  Dysarthria, right facial droop  EXAM: CT HEAD WITHOUT CONTRAST  TECHNIQUE: Contiguous axial images were obtained from the base of the skull through the vertex without intravenous contrast.  COMPARISON:  Prior head CT 04/15/2013  FINDINGS: Resolving heterogeneous extra-axial collection overlying the right frontal convexity consistent with a chronic subdural hematoma/ hygroma. The  collection is smaller compared to 04/15/2013. New ill-defined hypoattenuation extending to the cortex in the left frontal watershed region which is an interval finding compared to 04/15/2013. There appears to be some associated encephalomalacia and is favored to be subacute to remote. Similarly, there is a new focus of hypoattenuation in the right posterior watershed region which also has an appearance suggestive of a subacute to remote process. Additionally, there is global cerebral and cerebellar atrophy and sequelae of chronic microvascular ischemic white matter disease. No focal soft tissue or calvarial abnormality. Normal aeration of the mastoid air cells and paranasal sinuses. Burr hole defect in the right frontal calvarium.  IMPRESSION: 1. No definite acute intracranial abnormality. 2. Hypoattenuation of the left frontal and right posterior watershed regions represents an interval change compared 04/15/2013 consistent with interval ischemic insults, however both regions have an appearance suggestive of subacute to chronic processes. 3. Slight interval improvement in chronic right frontal subdural hematoma/hygroma. 4. Atrophy and chronic microvascular ischemic white matter disease.   Electronically Signed   By: Jacqulynn Cadet M.D.   On: 12/16/2013 09:30     EKG Interpretation   Date/Time:  Monday Dec 16 2013 08:25:09 EDT Ventricular Rate:  80 PR Interval:  147 QRS Duration: 91 QT  Interval:  396 QTC Calculation: 457 R Axis:   -38 Text Interpretation:  Sinus rhythm Left axis deviation `no acute st/t  changes compared w ecg 05/20/05 Confirmed by Ashok Cordia  MD, Lennette Bihari ([id number]) on  12/16/2013 8:33:22 AM      MDM   Final diagnoses:  Fall  Altered mental status  Slurred speech    Patient is a 80 y.o. male who presents after being found on floor this morning and family notes AMS, slurred speech, and patient not moving right side.  Last seen normal appx 12 hours ago.  AF and VS wnl upon arrival.  PE as above and remarkable for decreased strength in RUE and mild dysarthria. BG >100 upon arrival.  Given last seen normal < 5 hours ago, code stroke not initiated.  Workup for AMS included head CT, EKG, UA, and basic labs.  Review of results shows EKG with no signs of ischemia, labs unremarkable, and head CT with evidence of hypoattenuation in left frontal and right posterior watershed areas.  Neurology and hospitalist consulted for admission in the setting of CVA.           Corlis Leak, MD 12/16/13 [phone]

## 2013-12-16 NOTE — H&P (Signed)
History and Physical       Hospital Admission Note Date: 12/16/2013  Patient name: [name] Medical record number: [id number] Date of birth: [birth year] Age: 80 y.o. Gender: male  PCP: Marton Redwood, MD    Chief Complaint:  Found on the floor by his bedside, garbled speech, right upper arm weakness  HPI: Patient is a 80 year old male with history of diabetes, hypertension, CAD, bilateral BKA was brought to the ER with the above complaints. History was mostly obtained from the patient's wife, patient is alert and oriented to self but not able to provide any history. Patient's wife reported that his grand daughter found him on the floor by the bed around 7:30-7:45 AM. Patient had no idea how he got/fell on the floor. She called patient's wife and they noticed him to have garbled speech and confused. Patient's wife also reported that his right arm has been weak (flaccid on exam) for a "while" for last few weeks and they were thinking that it was arthritis.   CT of the head in the ER showed no definite acute intracranial abnormality however hypoattenuation in the left frontal and right posterior watershed regions represent interval change consistent with interval ischemic insults, subacute to chronic process. Hospitalist was requested for admission for possible CVA/TIA  Review of Systems:  Unable to obtain from the patient due to his mental status  Past Medical History: Past Medical History  Diagnosis Date  . Diabetes mellitus   . Hypertension   . Testicular cancer dx'd 08/2010    surg only  . CHF (congestive heart failure)   . CAD (coronary artery disease)   . Peripheral vascular disease   . Vitamin B12 deficiency   . Diabetic retinopathy   . Diabetic peripheral neuropathy   . Cholelithiasis   . Family history of colon cancer     brother   . 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 Osteomyelitis of finger of right hand 03/2013    R middle  finger  . Pressure ulcer of buttock 03/2013    stage 2   Past Surgical History  Procedure Laterality Date  . Below knee leg amputation  2005    Right  . Below knee leg amputation  2006    Left  . Appendectomy    . Eye surgery  2012    Laser  . Ptca    . Trudee Kuster hole Right 03/04/2013    Procedure: Haskell Flirt;  Surgeon: Charlie Pitter, MD;  Location: [facility];  Service: Neurosurgery;  Laterality: Right;  Burr Holes     Medications: Prior to Admission medications   Medication Sig Start Date End Date Taking? Authorizing Provider  carvedilol (COREG) 6.25 MG tablet Take 6.25 mg by mouth daily.   Yes Historical Provider, MD  diphenhydrAMINE (BENADRYL) 25 mg capsule Take 25 mg by mouth every 8 (eight) hours as needed for itching.   Yes Historical Provider, MD  ezetimibe (ZETIA) 10 MG tablet Take 10 mg by mouth daily.   Yes Historical Provider, MD  HYDROcodone-acetaminophen (NORCO/VICODIN) 5-325 MG per tablet Take 1 tablet by mouth every 4 (four) hours as needed for pain. 03/21/13  Yes Tiffany L Reed, DO  insulin aspart (NOVOLOG) 100 UNIT/ML injection Inject 4 Units into the skin 3 (three) times daily with meals.   Yes Historical Provider, MD  insulin glargine (LANTUS) 100 UNIT/ML injection Inject 8 Units into the skin daily with breakfast.   Yes Historical Provider, MD  levETIRAcetam (KEPPRA) 500 MG tablet Take 1 tablet (  500 mg total) by mouth 2 (two) times daily. 03/20/13  Yes Hosie Spangle, MD  lisinopril (PRINIVIL,ZESTRIL) 10 MG tablet Take 10 mg by mouth daily.     Yes Historical Provider, MD  simvastatin (ZOCOR) 40 MG tablet Take 40 mg by mouth at bedtime.     Yes Historical Provider, MD  solifenacin (VESICARE) 5 MG tablet Take 5 mg by mouth daily.   Yes Historical Provider, MD  Tamsulosin HCl (FLOMAX) 0.4 MG CAPS Take 0.4 mg by mouth daily.     Yes Historical Provider, MD  traMADol (ULTRAM) 50 MG tablet Take 50 mg by mouth every 6 (six) hours as needed for moderate pain.  12/10/13  Yes  Historical Provider, MD  VOLTAREN 1 % GEL Apply 1 application topically 2 (two) times daily as needed (pain).  11/20/13  Yes Historical Provider, MD    Allergies:  No Known Allergies  Social History:  reports that he has quit smoking. He has never used smokeless tobacco. He reports that he does not drink alcohol or use illicit drugs.  Family History: Family History  Problem Relation Age of Onset  . Colon cancer Brother     Physical Exam: Blood pressure 127/58, pulse 69, temperature 98.9 F (37.2 C), temperature source Oral, resp. rate 18, SpO2 95.00%. General: Alert, awake, oriented x1, in no acute distress, confused, speech has improved per his wife at the bedside HEENT: normocephalic, atraumatic, anicteric sclera, pink conjunctiva, pupils equal and reactive to light and accomodation, oropharynx clear Neck: supple, no masses or lymphadenopathy, no goiter, no bruits  Heart: Regular rate and rhythm, without murmurs, rubs or gallops. Lungs: Clear to auscultation bilaterally, no wheezing, rales or rhonchi. Abdomen: Soft, nontender, nondistended, positive bowel sounds Extremities: Bilateral BKA Neuro: Confused, right arm flaccid, per wife at the bedside dysarthria has improved, bilateral BKA Psych: alert and oriented x 3, normal mood and affect Skin: no rashes or lesions, warm and dry   LABS on Admission:  Basic Metabolic Panel:  Recent Labs Lab 12/16/13 0848  NA 143  K 4.4  CL 109  CO2 19  GLUCOSE 157*  BUN 28*  CREATININE 1.10  CALCIUM 8.6   Liver Function Tests:  Recent Labs Lab 12/16/13 0848  AST 11  ALT 8  ALKPHOS 113  BILITOT 0.5  PROT 6.9  ALBUMIN 3.0*   No results found for this basename: LIPASE, AMYLASE,  in the last 168 hours No results found for this basename: AMMONIA,  in the last 168 hours CBC:  Recent Labs Lab 12/16/13 0848  WBC 9.0  HGB 13.6  HCT 41.3  MCV 96.5  PLT 180   Cardiac Enzymes: No results found for this basename: CKTOTAL,  CKMB, CKMBINDEX, TROPONINI,  in the last 168 hours BNP: No components found with this basename: POCBNP,  CBG:  Recent Labs Lab 12/16/13 0846  GLUCAP 135*     Radiological Exams on Admission: Ct Head Wo Contrast  12/16/2013   CLINICAL DATA:  Dysarthria, right facial droop  EXAM: CT HEAD WITHOUT CONTRAST  TECHNIQUE: Contiguous axial images were obtained from the base of the skull through the vertex without intravenous contrast.  COMPARISON:  Prior head CT 04/15/2013  FINDINGS: Resolving heterogeneous extra-axial collection overlying the right frontal convexity consistent with a chronic subdural hematoma/ hygroma. The collection is smaller compared to 04/15/2013. New ill-defined hypoattenuation extending to the cortex in the left frontal watershed region which is an interval finding compared to 04/15/2013. There appears to be some associated encephalomalacia  and is favored to be subacute to remote. Similarly, there is a new focus of hypoattenuation in the right posterior watershed region which also has an appearance suggestive of a subacute to remote process. Additionally, there is global cerebral and cerebellar atrophy and sequelae of chronic microvascular ischemic white matter disease. No focal soft tissue or calvarial abnormality. Normal aeration of the mastoid air cells and paranasal sinuses. Burr hole defect in the right frontal calvarium.  IMPRESSION: 1. No definite acute intracranial abnormality. 2. Hypoattenuation of the left frontal and right posterior watershed regions represents an interval change compared 04/15/2013 consistent with interval ischemic insults, however both regions have an appearance suggestive of subacute to chronic processes. 3. Slight interval improvement in chronic right frontal subdural hematoma/hygroma. 4. Atrophy and chronic microvascular ischemic white matter disease.   Electronically Signed   By: Jacqulynn Cadet M.D.   On: 12/16/2013 09:30     Assessment/Plan Principal Problem:  Acute CVA (cerebral infarction)/TIA acute encephalopathy - Will obtain MRI, MRA of the brain for further workup, EEG  (he has prior history of subdural hematoma,? seizures on Keppra). 2-D echo, carotid Dopplers - Will check UA and culture to rule out UTI - Hold narcotics and sedating meds - EDP requesting neurology consult - Continue Keppra    Active Problems:   DIABETES MELLITUS - Will place on sliding scale insulin once passed swallow testing  - Continue Lantus     HYPERLIPIDEMIA - Continue statins     HYPERTENSION - BP currently stable, borderline, hold antihypertensives     CAD - No current cardiac symptoms     S/P bilateral BKA (below knee amputation)   DVT prophylaxis:  Lovenox   CODE STATUS:  Full code, discussed with patient's wife   Family Communication: Admission, patients condition and plan of care including tests being ordered have been discussed with the patient's wife who indicates understanding and agree with the plan and Code Status   Further plan will depend as patient's clinical course evolves and further radiologic and laboratory data become available.   Time Spent on Admission: 1 hour  Ripudeep Krystal Eaton M.D. [facility] 12/16/2013, 10:56 AM Pager: [phone]  If 7PM-7AM, please contact night-coverage [email or web address] Password [email or web address]  **Disclaimer: This note was dictated with voice recognition software. Similar sounding words can inadvertently be transcribed and this note may contain transcription errors which may not have been corrected upon publication of note.**

## 2013-12-16 NOTE — ED Notes (Signed)
Admitting MD at the bedside.  

## 2013-12-16 NOTE — Evaluation (Signed)
Clinical/Bedside Swallow Evaluation Patient Details  Name: [name] MRN: [id number] Date of Birth: 04/11/1933  Today's Date: 12/16/2013 Time: 4709-6283 SLP Time Calculation (min): 19 min  Past Medical History:  Past Medical History  Diagnosis Date  . Diabetes mellitus   . Hypertension   . Testicular cancer dx'd 08/2010    surg only  . CHF (congestive heart failure)   . CAD (coronary artery disease)   . Peripheral vascular disease   . Vitamin B12 deficiency   . Diabetic retinopathy   . Diabetic peripheral neuropathy   . Cholelithiasis   . Family history of colon cancer     brother   . 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 Osteomyelitis of finger of right hand 03/2013    R middle finger  . Pressure ulcer of buttock 03/2013    stage 2   Past Surgical History:  Past Surgical History  Procedure Laterality Date  . Below knee leg amputation  2005    Right  . Below knee leg amputation  2006    Left  . Appendectomy    . Eye surgery  2012    Laser  . Ptca    . Trudee Kuster hole Right 03/04/2013    Procedure: Haskell Flirt;  Surgeon: Charlie Pitter, MD;  Location: [facility];  Service: Neurosurgery;  Laterality: Right;  Haskell Flirt    HPI:  [name] is an 80 y.o. male with history of right SDH S/P bur hole for evacuation for a large chronic subdural hematoma with marked mass effect. Patient also has history of seizures which he currently is on Keppra.  Per wife he is wheel chair bound, has recently shown increased memory decline, able to feed himself but otherwise needs assistance with all ADL's.  He has a Home health CNA that cares for him.  Pt has a h/o dysphagia reportedly requiring thickened liquids, with MBS 03/07/13 revealingsilent aspiration before the swallow. Pt was recommended to consume regular textures and thin liquids with a chin tuck. Per pt's family, he was cleared by SLP at [facility] for regular textures and thin lqiuids without need for chin tuck.    Assessment / Plan /  Recommendation Clinical Impression  Pt presents with no overt s/s of aspiration or dysphagia with swallow response that appears timely; however, he does have a h/o silent aspiration requiring a chin tuck with thin liquids. Per family, pt was cleared by SLP at [facility] to drink thin liquids without a chin tuck. Also of note, pt's onset of symptoms was approximately one month ago, and pt has continued to consume unrestricted PO since that time. Given the above, recommend to initiate regular textures and thin liquids with close supervision for tolerance. Recommend to administer meds whole in puree and to avoid straws as a precautionary measure. SLP to follow for diet tolerance and assessment of cognitive-linguistic abilities.     Aspiration Risk  Moderate    Diet Recommendation Regular;Thin liquid   Liquid Administration via: Cup;No straw Medication Administration: Whole meds with puree Supervision: Patient able to self feed;Staff to assist with self feeding;Full supervision/cueing for compensatory strategies Compensations: Slow rate;Small sips/bites Postural Changes and/or Swallow Maneuvers: Seated upright 90 degrees    Other  Recommendations Oral Care Recommendations: Oral care BID   Follow Up Recommendations  None (none anticipated at this time for swallowing)    Frequency and Duration min 2x/week  2 weeks   Pertinent Vitals/Pain N/A    SLP Swallow Goals     Swallow Study  Prior Functional Status       General Date of Onset: 12/16/13 HPI: [name] is an 80 y.o. male with history of right SDH S/P bur hole for evacuation for a large chronic subdural hematoma with marked mass effect. Patient also has history of seizures which he currently is on Keppra.  Per wife he is wheel chair bound, has recently shown increased memory decline, able to feed himself but otherwise needs assistance with all ADL's.  He has a Home health CNA that cares for him.  Pt has a h/o dysphagia reportedly requiring  thickened liquids, with MBS 03/07/13 revealingsilent aspiration before the swallow. Pt was recommended to consume regular textures and thin liquids with a chin tuck. Per pt's family, he was cleared by SLP at [facility] for regular textures and thin lqiuids without need for chin tuck.  Type of Study: Bedside swallow evaluation Previous Swallow Assessment: MBS 03/07/13 - please refer to HPI for further details Diet Prior to this Study: NPO Temperature Spikes Noted: No Respiratory Status: Room air History of Recent Intubation: No Behavior/Cognition: Alert;Cooperative;Pleasant mood;Confused;Requires cueing Oral Cavity - Dentition: Dentures, top;Dentures, bottom Self-Feeding Abilities: Able to feed self Patient Positioning: Upright in bed Baseline Vocal Quality: Clear Volitional Cough: Strong Volitional Swallow: Able to elicit    Oral/Motor/Sensory Function Overall Oral Motor/Sensory Function: Other (comment) (difficult to assess given reduced ability to follow commands) Labial ROM: Within Functional Limits Labial Symmetry: Within Functional Limits Lingual ROM: Within Functional Limits Facial ROM: Within Functional Limits Facial Symmetry: Within Functional Limits Velum: Within Functional Limits Mandible: Within Functional Limits   Ice Chips Ice chips: Not tested   Thin Liquid Thin Liquid: Within functional limits Presentation: Cup;Straw    Nectar Thick Nectar Thick Liquid: Not tested   Honey Thick Honey Thick Liquid: Not tested   Puree Puree: Within functional limits Presentation: Spoon   Solid   GO    Solid: Within functional limits        Germain Osgood, M.A. CCC-SLP [phone]  Germain Osgood 12/16/2013,3:54 PM

## 2013-12-16 NOTE — ED Notes (Signed)
Wife states he had a problem swallowing before and was on nectar thick liquids but is currently on regular liquids. Denies him taking any blood thinners. States his right arm has been flaccid for a while.

## 2013-12-16 NOTE — ED Notes (Signed)
Paged Dr. Doy Mince to [phone]

## 2013-12-16 NOTE — Consult Note (Signed)
Referring Physician: Rai    Chief Complaint: possible stroke.  HPI:                                                                                                                                         [name] is an 80 y.o. male with history of right SDH S/P bur hole for evacuation for a large chronic subdural hematoma with marked mass effect. Patient also has history of seizures which he currently is on Keppra 500 mg BID.  Per wife HE is wheel chair bound, has recently shown increased memory decline, able to feed himself but otherwise needs assistance with all ADL's.  HE has a Home health CNA which cares for him.    Over the past month she has noted he does not use his right arm and allows it to hang over his wheel chair.  In addition she has noted over the last few days he has had less vision in the right field. Yesterday at 1600 he was noted to have increased slurred speech but this improved over the night.  This AM he was found on the floor next to his bed and confused by granddaughter. It is not clear how he arrived on the floor.  He has no bruises or hematomas.  He complains of no pain.    Per wife he continues to sound mor slurred than usual.  Date last known well: Date: 12/15/2013 Time last known well: Time: 16:00 tPA Given: No: out of window ABCD2 --8   Past Medical History  Diagnosis Date  . Diabetes mellitus   . Hypertension   . Testicular cancer dx'd 08/2010    surg only  . CHF (congestive heart failure)   . CAD (coronary artery disease)   . Peripheral vascular disease   . Vitamin B12 deficiency   . Diabetic retinopathy   . Diabetic peripheral neuropathy   . Cholelithiasis   . Family history of colon cancer     brother   . 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 Osteomyelitis of finger of right hand 03/2013    R middle finger  . Pressure ulcer of buttock 03/2013    stage 2    Past Surgical History  Procedure Laterality Date  . Below knee leg amputation  2005    Right  .  Below knee leg amputation  2006    Left  . Appendectomy    . Eye surgery  2012    Laser  . Ptca    . Trudee Kuster hole Right 03/04/2013    Procedure: Haskell Flirt;  Surgeon: Charlie Pitter, MD;  Location: [facility];  Service: Neurosurgery;  Laterality: Right;  Haskell Flirt     Family History  Problem Relation Age of Onset  . Colon cancer Brother    Social History:  reports that he has quit smoking. He has never used smokeless tobacco. He reports  that he does not drink alcohol or use illicit drugs.  Allergies: No Known Allergies  Medications:                                                                                                                           Prior to Admission:  Prescriptions prior to admission  Medication Sig Dispense Refill  . carvedilol (COREG) 6.25 MG tablet Take 6.25 mg by mouth daily.      . diphenhydrAMINE (BENADRYL) 25 mg capsule Take 25 mg by mouth every 8 (eight) hours as needed for itching.      . ezetimibe (ZETIA) 10 MG tablet Take 10 mg by mouth daily.      Marland Kitchen HYDROcodone-acetaminophen (NORCO/VICODIN) 5-325 MG per tablet Take 1 tablet by mouth every 4 (four) hours as needed for pain.  120 tablet  5  . insulin aspart (NOVOLOG) 100 UNIT/ML injection Inject 4 Units into the skin 3 (three) times daily with meals.      . insulin glargine (LANTUS) 100 UNIT/ML injection Inject 8 Units into the skin daily with breakfast.      . levETIRAcetam (KEPPRA) 500 MG tablet Take 1 tablet (500 mg total) by mouth 2 (two) times daily.  1 tablet  0  . lisinopril (PRINIVIL,ZESTRIL) 10 MG tablet Take 10 mg by mouth daily.        . simvastatin (ZOCOR) 40 MG tablet Take 40 mg by mouth at bedtime.        . solifenacin (VESICARE) 5 MG tablet Take 5 mg by mouth daily.      . Tamsulosin HCl (FLOMAX) 0.4 MG CAPS Take 0.4 mg by mouth daily.        . traMADol (ULTRAM) 50 MG tablet Take 50 mg by mouth every 6 (six) hours as needed for moderate pain.       Marland Kitchen VOLTAREN 1 % GEL Apply 1 application  topically 2 (two) times daily as needed (pain).        Scheduled:  ROS:                                                                                                                                       History obtained from the patient and wife  General ROS: negative for - chills, fatigue, fever, night sweats, weight gain or weight loss Psychological ROS: negative for - behavioral disorder, hallucinations,  memory difficulties, mood swings or suicidal ideation Ophthalmic ROS: negative for - blurry vision, double vision, eye pain or loss of vision ENT ROS: negative for - epistaxis, nasal discharge, oral lesions, sore throat, tinnitus or vertigo Allergy and Immunology ROS: negative for - hives or itchy/watery eyes Hematological and Lymphatic ROS: negative for - bleeding problems, bruising or swollen lymph nodes Endocrine ROS: negative for - galactorrhea, hair pattern changes, polydipsia/polyuria or temperature intolerance Respiratory ROS: negative for - cough, hemoptysis, shortness of breath or wheezing Cardiovascular ROS: negative for - chest pain, dyspnea on exertion, edema or irregular heartbeat Gastrointestinal ROS: negative for - abdominal pain, diarrhea, hematemesis, nausea/vomiting or stool incontinence Genito-Urinary ROS: negative for - dysuria, hematuria, incontinence or urinary frequency/urgency Musculoskeletal ROS: negative for - joint swelling or muscular weakness Neurological ROS: as noted in HPI Dermatological ROS: negative for rash and skin lesion changes  Neurologic Examination:                                                                                                      Blood pressure 108/77, pulse 68, temperature 97.8 F (36.6 C), temperature source Oral, resp. rate 16, SpO2 97.00%.   Mental Status: Alert, not oriented to place, year or month.  Speech mild dysarthria without evidence of aphasia.  Able to follow 3 step commands without difficulty. Cranial  Nerves: II: Discs flat bilaterally; Visual fields shows inability to count my fingers in the right visual field, right pupil round, left pupil oval both reactive to light and accommodation III,IV, VI: ptosis not present, extra-ocular motions intact bilaterally V,VII: smile symmetric, facial light touch sensation normal bilaterally VIII: hearing normal bilaterally IX,X: gag reflex present XI: bilateral shoulder shrug XII: midline tongue extension without atrophy or fasciculations  Motor: Right : Upper extremity   0/5 proximally and 3/5 grip   Left:     Upper extremity   5/5  Lower extremity   4/5       Lower extremity   5/5 --Bilateral BKA Tone and bulk:normal tone throughout; no atrophy noted Sensory: Pinprick and light touch decreased on right arm and leg Deep Tendon Reflexes:  Right: Upper Extremity   Left: Upper extremity   biceps (C-5 to C-6) 2/4   biceps (C-5 to C-6) 2/4 tricep (C7) 2/4    triceps (C7) 2/4 Brachioradialis (C6) 2/4  Brachioradialis (C6) 2/4  Lower Extremity Lower Extremity  quadriceps (L-2 to L-4) 1/4   quadriceps (L-2 to L-4) 1/4      Plantars: BKA bilaterally Cerebellar: normal finger-to-nose on the left--unable to perform on the right,   Gait: wheel chair bound CV: pulses palpable throughout    Lab Results: Basic Metabolic Panel:  Recent Labs Lab 12/16/13 0848  NA 143  K 4.4  CL 109  CO2 19  GLUCOSE 157*  BUN 28*  CREATININE 1.10  CALCIUM 8.6    Liver Function Tests:  Recent Labs Lab 12/16/13 0848  AST 11  ALT 8  ALKPHOS 113  BILITOT 0.5  PROT 6.9  ALBUMIN 3.0*   No results found for this basename: LIPASE,  AMYLASE,  in the last 168 hours No results found for this basename: AMMONIA,  in the last 168 hours  CBC:  Recent Labs Lab 12/16/13 0848  WBC 9.0  HGB 13.6  HCT 41.3  MCV 96.5  PLT 180    Cardiac Enzymes: No results found for this basename: CKTOTAL, CKMB, CKMBINDEX, TROPONINI,  in the last 168 hours  Lipid  Panel: No results found for this basename: CHOL, TRIG, HDL, CHOLHDL, VLDL, LDLCALC,  in the last 168 hours  CBG:  Recent Labs Lab 12/16/13 0846  GLUCAP 135*    Microbiology: Results for orders placed during the hospital encounter of 03/04/13  MRSA PCR SCREENING     Status: None   Collection Time    03/04/13  9:01 PM      Result Value Ref Range Status   MRSA by PCR NEGATIVE  NEGATIVE Final   Comment:            The GeneXpert MRSA Assay (FDA     approved for NASAL specimens     only), is one component of a     comprehensive MRSA colonization     surveillance program. It is not     intended to diagnose MRSA     infection nor to guide or     monitor treatment for     MRSA infections.  CULTURE, BLOOD (SINGLE)     Status: None   Collection Time    03/10/13  4:53 PM      Result Value Ref Range Status   Specimen Description BLOOD LEFT ARM   Final   Special Requests BOTTLES DRAWN AEROBIC ONLY 2CC   Final   Culture  Setup Time     Final   Value: 03/10/2013 21:45     Performed at [facility]   Culture     Final   Value: NO GROWTH 5 DAYS     Performed at [facility]   Report Status 03/16/2013 FINAL   Final  WOUND CULTURE     Status: None   Collection Time    03/10/13  6:38 PM      Result Value Ref Range Status   Specimen Description WOUND   Final   Special Requests RIGHT MIDDLE FINGER UNDER NAIL   Final   Gram Stain     Final   Value: RARE WBC PRESENT, PREDOMINANTLY PMN     NO SQUAMOUS EPITHELIAL CELLS SEEN     NO ORGANISMS SEEN     Performed at [facility]   Culture     Final   Value: FEW PROTEUS MIRABILIS     Performed at [facility]   Report Status 03/13/2013 FINAL   Final   Organism ID, Bacteria PROTEUS MIRABILIS   Final  ANAEROBIC CULTURE     Status: None   Collection Time    03/10/13  6:38 PM      Result Value Ref Range Status   Specimen Description WOUND   Final   Special Requests RIGHT MIDDLE FINGER UNDER NAIL   Final    Gram Stain     Final   Value: RARE WBC PRESENT, PREDOMINANTLY PMN     NO SQUAMOUS EPITHELIAL CELLS SEEN     NO ORGANISMS SEEN     Performed at [facility]   Culture     Final   Value: NO ANAEROBES ISOLATED     Performed at [facility]   Report Status 03/16/2013  FINAL   Final  TISSUE CULTURE     Status: None   Collection Time    03/10/13  6:40 PM      Result Value Ref Range Status   Specimen Description TISSUE   Final   Special Requests RIGHT MIDDLE FINGER UNDER NAIL   Final   Gram Stain     Final   Value: NO WBC SEEN     NO ORGANISMS SEEN     Performed at [facility]   Culture     Final   Value: RARE PROTEUS MIRABILIS     Performed at [facility]   Report Status 03/13/2013 FINAL   Final   Organism ID, Bacteria PROTEUS MIRABILIS   Final    Coagulation Studies: No results found for this basename: LABPROT, INR,  in the last 72 hours  Imaging: Ct Head Wo Contrast  12/16/2013   CLINICAL DATA:  Dysarthria, right facial droop  EXAM: CT HEAD WITHOUT CONTRAST  TECHNIQUE: Contiguous axial images were obtained from the base of the skull through the vertex without intravenous contrast.  COMPARISON:  Prior head CT 04/15/2013  FINDINGS: Resolving heterogeneous extra-axial collection overlying the right frontal convexity consistent with a chronic subdural hematoma/ hygroma. The collection is smaller compared to 04/15/2013. New ill-defined hypoattenuation extending to the cortex in the left frontal watershed region which is an interval finding compared to 04/15/2013. There appears to be some associated encephalomalacia and is favored to be subacute to remote. Similarly, there is a new focus of hypoattenuation in the right posterior watershed region which also has an appearance suggestive of a subacute to remote process. Additionally, there is global cerebral and cerebellar atrophy and sequelae of chronic microvascular ischemic white matter disease. No focal soft  tissue or calvarial abnormality. Normal aeration of the mastoid air cells and paranasal sinuses. Burr hole defect in the right frontal calvarium.  IMPRESSION: 1. No definite acute intracranial abnormality. 2. Hypoattenuation of the left frontal and right posterior watershed regions represents an interval change compared 04/15/2013 consistent with interval ischemic insults, however both regions have an appearance suggestive of subacute to chronic processes. 3. Slight interval improvement in chronic right frontal subdural hematoma/hygroma. 4. Atrophy and chronic microvascular ischemic white matter disease.   Electronically Signed   By: Jacqulynn Cadet M.D.   On: 12/16/2013 09:30    Patient seen and examined.  Clinical course and management discussed.  Necessary edits performed.  I agree with the above.  Assessment and plan of care developed and discussed below.    Etta Quill PA-C [facility] [phone]  12/16/2013, 12:25 PM   Assessment: 80 y.o. male presenting today after being found down.  Has had progressive decline in function over the past month and now has dysarthria as well.  Head CT reviewed and shows areas of decreased attenuation in the left frontal and right posterior watershed regions.  They are new since his previous imaging.  It is unclear if they are acute but it does not seem that all of his symptoms are acute either.  Further work up recommended.  Patient on no antiplatelet therapy at home.    Stroke Risk Factors - diabetes mellitus and hypertension  Recommendations: 1. HgbA1c, fasting lipid panel 2. MRI, MRA  of the brain without contrast 3. PT consult, OT consult, Speech consult 4. Echocardiogram 5. Carotid dopplers 6. Prophylactic therapy-Antiplatelet med: Aspirin - dose 81mg  daily 7. Risk factor modification 8. Telemetry monitoring 9. Frequent neuro checks   Magda Paganini  Doy Mince, [place] [facility] [phone]  12/16/2013  6:07 PM

## 2013-12-16 NOTE — ED Notes (Signed)
Paged Dr. Brigitte Pulse to [phone]

## 2013-12-16 NOTE — ED Notes (Signed)
Family at bedside. States she didn't know he fell. Granddaughter found him on the floor.

## 2013-12-16 NOTE — ED Notes (Signed)
Per [facility], pt from home, lives with wife. Found on floor beside his bed this morning by his daughter. Pt has had garbled speech with EMS and per family he has not been making sense. Has bilateral BKA and SR on the monitor. 18g to LFA. Right side is flaccid with no known hx. Family poor historians and not able to give any information to EMS.

## 2013-12-16 NOTE — Progress Notes (Deleted)
Bilateral carotid artery duplex completed.  Bilateral:  Vertebral artery flow is antegrade.  Right:  1-39% ICA stenosis.  Left:  40-59% internal carotid artery stenosis.

## 2013-12-17 ENCOUNTER — Inpatient Hospital Stay (HOSPITAL_COMMUNITY): Payer: Medicare Other

## 2013-12-17 LAB — LIPID PANEL
CHOL/HDL RATIO: 2.3 ratio
Cholesterol: 92 mg/dL (ref 0–200)
HDL: 40 mg/dL (ref >39)
LDL Cholesterol: 40 mg/dL (ref 0–99)
Triglycerides: 61 mg/dL (ref <150)
VLDL: 12 mg/dL (ref 0–40)

## 2013-12-17 LAB — GLUCOSE, CAPILLARY
GLUCOSE-CAPILLARY: 218 mg/dL — AB (ref 70–99)
Glucose-Capillary: 144 mg/dL — ABNORMAL HIGH (ref 70–99)
Glucose-Capillary: 225 mg/dL — ABNORMAL HIGH (ref 70–99)

## 2013-12-17 LAB — HEMOGLOBIN A1C
Hgb A1c MFr Bld: 7.4 % — ABNORMAL HIGH (ref <5.7)
Mean Plasma Glucose: 166 mg/dL — ABNORMAL HIGH (ref <117)

## 2013-12-17 MED ORDER — HALOPERIDOL LACTATE 5 MG/ML IJ SOLN
2.0000 mg | Freq: Four times a day (QID) | INTRAMUSCULAR | Status: DC | PRN
  Administered 2013-12-17: 2 mg via INTRAVENOUS
  Filled 2013-12-17: qty 1

## 2013-12-17 NOTE — Evaluation (Signed)
Speech Language Pathology Evaluation Patient Details Name: [name] MRN: [id number] DOB: [birth year] Today's Date: 12/17/2013 Time: 4098-1191 SLP Time Calculation (min): 23 min  Problem List:  Patient Active Problem List   Diagnosis Date Noted  . CVA (cerebral infarction) 12/16/2013  . Right arm weakness 12/16/2013  . S/P bilateral BKA (below knee amputation) 12/16/2013  . Osteomyelitis of finger of right hand   . Pressure ulcer of buttock   . 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 Community acquired pneumonia 03/04/2013  . 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 GASTRITIS 01/29/2008  . CHOLELITHIASIS 12/25/2007  . BLOOD IN STOOL-MELENA 12/25/2007  . ABDOMINAL PAIN-LUQ 12/25/2007  . DIABETES MELLITUS 12/21/2007  . DIABETIC PERIPHERAL NEUROPATHY 12/21/2007  . B12 DEFICIENCY 12/21/2007  . HYPERLIPIDEMIA 12/21/2007  . HYPERTENSION 12/21/2007  . CAD 12/21/2007  . CHF 12/21/2007  . PVD 12/21/2007  . CHOLELITHIASIS 12/21/2007  . FOOT ULCER, LEFT 12/21/2007   Past Medical History:  Past Medical History  Diagnosis Date  . Diabetes mellitus   . Hypertension   . Testicular cancer dx'd 08/2010    surg only  . CHF (congestive heart failure)   . CAD (coronary artery disease)   . Peripheral vascular disease   . Vitamin B12 deficiency   . Diabetic retinopathy   . Diabetic peripheral neuropathy   . Cholelithiasis   . Family history of colon cancer     brother   . 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 Osteomyelitis of finger of right hand 03/2013    R middle finger  . Pressure ulcer of buttock 03/2013    stage 2   Past Surgical History:  Past Surgical History  Procedure Laterality Date  . Below knee leg amputation  2005    Right  . Below knee leg amputation  2006    Left  . Appendectomy    . Eye surgery  2012    Laser  . Ptca    . Trudee Kuster hole Right 03/04/2013    Procedure: Haskell Flirt;  Surgeon: Charlie Pitter, MD;  Location: [facility];  Service: Neurosurgery;  Laterality: Right;  Haskell Flirt     HPI:  [name] is an 80 y.o. male with history of right SDH S/P bur hole for evacuation for a large chronic subdural hematoma with marked mass effect. Patient also has history of seizures which he currently is on Keppra.  Per wife he is wheel chair bound, has recently shown increased memory decline, able to feed himself but otherwise needs assistance with all ADL's.  He has a Home health CNA that cares for him.  Pt has a h/o dysphagia reportedly requiring thickened liquids, with MBS 03/07/13 revealingsilent aspiration before the swallow. Pt was recommended to consume regular textures and thin liquids with a chin tuck. Per pt's family, he was cleared by SLP at [facility] for regular textures and thin lqiuids without need for chin tuck.    Assessment / Plan / Recommendation Clinical Impression  Pt presents with significant cognitive impairments in the areas of orientation, awareness, recall of information, and basic problem solving. He also exhibits perseveration, echolalia, and phonemic paraphasias with no awareness of inconsistent errors. Pt's family reports that the above deficits have worsened over the past month, to the point where they no longer felt pt was safe to be left alone for any period of time. Pt will benefit from SLP services in the acute care setting to address the impairments noted above.    SLP Assessment  Patient needs continued Speech Lanaguage  Pathology Services    Follow Up Recommendations  Skilled Nursing facility;24 hour supervision/assistance    Frequency and Duration min 2x/week  2 weeks   Pertinent Vitals/Pain N/A   SLP Goals  SLP Goals Potential to Achieve Goals: Fair Potential Considerations: Ability to learn/carryover information;Previous level of function  SLP Evaluation Prior Functioning  Cognitive/Linguistic Baseline: Baseline deficits Baseline deficit details: per daughter, she was able to leave pt at home by himself for brief periods of time Type of Home:  House  Lives With: Spouse Available Help at Discharge: Family;Available 24 hours/day   Cognition  Overall Cognitive Status: Impaired/Different from baseline Arousal/Alertness: Awake/alert Orientation Level: Oriented to person;Disoriented to place;Disoriented to time;Disoriented to situation Attention: Sustained Sustained Attention: Impaired Sustained Attention Impairment: Verbal basic;Functional basic Memory: Impaired Memory Impairment: Decreased recall of new information Awareness: Impaired Awareness Impairment: Intellectual impairment;Emergent impairment;Anticipatory impairment Problem Solving: Impaired Problem Solving Impairment: Verbal basic;Functional basic Executive Function:  (all impaired due to lower level deficits) Behaviors: Perseveration;Poor frustration tolerance Safety/Judgment: Impaired    Comprehension  Auditory Comprehension Overall Auditory Comprehension: Impaired Commands: Impaired One Step Basic Commands: 50-74% accurate Interfering Components: Attention;Motor planning;Processing speed;Working Curator: Not tested Reading Comprehension Reading Status: Not tested    Expression Expression Primary Mode of Expression: Verbal Verbal Expression Overall Verbal Expression: Impaired Initiation: No impairment Automatic Speech: Name;Social Response Level of Generative/Spontaneous Verbalization: Sentence Repetition: No impairment (intact at the word level) Naming: Impairment Confrontation: Impaired Divergent: 0-24% accurate Verbal Errors: Phonemic paraphasias;Echolalia;Not aware of errors;Perseveration Interfering Components: Attention Non-Verbal Means of Communication: Not applicable Written Expression Written Expression: Not tested   Oral / Motor Oral Motor/Sensory Function Overall Oral Motor/Sensory Function: Other (comment) (difficult to assess given reduced ability to follow commands) Labial ROM: Within  Functional Limits Labial Symmetry: Within Functional Limits Lingual ROM: Within Functional Limits Facial ROM: Within Functional Limits Facial Symmetry: Within Functional Limits Velum: Within Functional Limits Mandible: Within Functional Limits Motor Speech Overall Motor Speech: Impaired Respiration: Within functional limits Phonation: Normal Resonance: Within functional limits Articulation: Impaired Level of Impairment: Sentence Intelligibility: Intelligibility reduced Sentence: 75-100% accurate Motor Planning: Impaired Level of Impairment: Word Motor Speech Errors: Unaware;Inconsistent   GO      Germain Osgood, M.A. CCC-SLP [phone]  Germain Osgood 12/17/2013, 10:46 AM

## 2013-12-17 NOTE — Progress Notes (Addendum)
Patient Demographics  [name], is a 80 y.o. male, DOB - [birth year], IEP:[id number]  Admit date - 12/16/2013   Admitting Physician Ripudeep Krystal Eaton, MD  Outpatient Primary MD for the patient is Marton Redwood, MD  LOS - 1   Chief Complaint  Patient presents with  . Altered Mental Status        Assessment & Plan    Acute CVA (cerebral infarction)/TIA acute encephalopathy - for 3 weeks  - Will obtain MRI, MRA of the brain for further workup,  2-D echo, carotid Dopplers . -Head CT is stable, however exam consistent with left MCA territory stroke. Continue aspirin along with statin for secondary prevention. - Urine stable- minimize narcotics and sedating meds  - Neuro following     H/O SDH 1 yr ago -   on Keppra for preventing seizure, ? EEG     DIABETES MELLITUS   - Will place on sliding scale insulin once passed swallow testing  - Continue Lantus + ISS   Lab Results  Component Value Date   HGBA1C 7.2* 12/16/2013    CBG (last 3)   Recent Labs  12/16/13 2240 12/17/13 0631 12/17/13 1117  GLUCAP 177* 144* 225*       HYPERLIPIDEMIA  - Continue statins    Lab Results  Component Value Date   CHOL 92 12/17/2013   HDL 40 12/17/2013   LDLCALC 40 12/17/2013   TRIG 61 12/17/2013   CHOLHDL 2.3 12/17/2013      HYPERTENSION  - BP currently stable, borderline, hold antihypertensives , as needed IV Hydralazine    CAD  - No current cardiac symptoms     S/P bilateral BKA (below knee amputation)      Code Status: Full  Family Communication:  Wife 12-17-13 @ 12.50pm  Disposition Plan: TBD   Procedures  MRI MRA brain, CT head, echo, carotid duplex, EEG   Consults  Neuro   Medications  Scheduled Meds: . aspirin  300 mg Rectal Daily   Or  . aspirin   325 mg Oral Daily  . darifenacin  7.5 mg Oral Daily  . enoxaparin (LOVENOX) injection  40 mg Subcutaneous Q24H  . ezetimibe  10 mg Oral Daily  . insulin aspart  0-5 Units Subcutaneous QHS  . insulin aspart  0-9 Units Subcutaneous TID WC  . insulin glargine  8 Units Subcutaneous Q breakfast  . levETIRAcetam  500 mg Oral BID  . simvastatin  40 mg Oral QHS  . tamsulosin  0.4 mg Oral Daily   Continuous Infusions: . sodium chloride 75 mL/hr at 12/17/13 0337   PRN Meds:.acetaminophen, acetaminophen, diphenhydrAMINE, haloperidol lactate, senna-docusate  DVT Prophylaxis  Lovenox    Lab Results  Component Value Date   PLT 180 12/16/2013    Antibiotics     Anti-infectives   None          Subjective:   [name] today has, No headache, No chest pain, No abdominal pain - No Nausea, No new weakness tingling or numbness, No Cough - SOB.   Objective:   Filed Vitals:   12/17/13 0000 12/17/13 0224 12/17/13 0536 12/17/13 1056  BP: 154/72 156/72 145/61 110/54  Pulse: 89 90 91 77  Temp: 99.5 F (37.5  C) 99.3 F (37.4 C) 98 F (36.7 C) 99 F (37.2 C)  TempSrc: Oral Oral Oral Oral  Resp: 18 18 18 18   SpO2:  92% 97% 98%    Wt Readings from Last 3 Encounters:  03/18/13 102.1 kg (225 lb 1.4 oz)  03/18/13 102.1 kg (225 lb 1.4 oz)  03/18/13 102.1 kg (225 lb 1.4 oz)    No intake or output data in the 24 hours ending 12/17/13 1242   Physical Exam  Awake but confused, R. Arm 1/5 , Normal affect [place].AT,PERRAL Supple Neck,No JVD, No cervical lymphadenopathy appriciated.  Symmetrical Chest wall movement, Good air movement bilaterally, CTAB RRR,No Gallops,Rubs or new Murmurs, No Parasternal Heave +ve B.Sounds, Abd Soft, Non tender, No organomegaly appriciated, No rebound - guarding or rigidity. No Cyanosis, Clubbing or edema, No new Rash or bruise , Bilat BKA     Data Review   Micro Results No results found for this or any previous visit (from the past 240  hour(s)).  Radiology Reports Ct Head Wo Contrast  12/16/2013   CLINICAL DATA:  Dysarthria, right facial droop  EXAM: CT HEAD WITHOUT CONTRAST  TECHNIQUE: Contiguous axial images were obtained from the base of the skull through the vertex without intravenous contrast.  COMPARISON:  Prior head CT 04/15/2013  FINDINGS: Resolving heterogeneous extra-axial collection overlying the right frontal convexity consistent with a chronic subdural hematoma/ hygroma. The collection is smaller compared to 04/15/2013. New ill-defined hypoattenuation extending to the cortex in the left frontal watershed region which is an interval finding compared to 04/15/2013. There appears to be some associated encephalomalacia and is favored to be subacute to remote. Similarly, there is a new focus of hypoattenuation in the right posterior watershed region which also has an appearance suggestive of a subacute to remote process. Additionally, there is global cerebral and cerebellar atrophy and sequelae of chronic microvascular ischemic white matter disease. No focal soft tissue or calvarial abnormality. Normal aeration of the mastoid air cells and paranasal sinuses. Burr hole defect in the right frontal calvarium.  IMPRESSION: 1. No definite acute intracranial abnormality. 2. Hypoattenuation of the left frontal and right posterior watershed regions represents an interval change compared 04/15/2013 consistent with interval ischemic insults, however both regions have an appearance suggestive of subacute to chronic processes. 3. Slight interval improvement in chronic right frontal subdural hematoma/hygroma. 4. Atrophy and chronic microvascular ischemic white matter disease.   Electronically Signed   By: Jacqulynn Cadet M.D.   On: 12/16/2013 09:30   Dg Chest Port 1 View  12/16/2013   CLINICAL DATA:  Right-side weakness. Confusion. Diabetic hypertensive patient. History of testicular cancer.  EXAM: PORTABLE CHEST - 1 VIEW  COMPARISON:   03/04/2013.  FINDINGS: Heart size top-normal.  Mild central pulmonary vascular prominence.  Evaluation lung bases slightly limited.  No segmental consolidation.  No plain film evidence of pulmonary malignancy detected on this decreased inspiratory portable exam. Patient would eventually benefit from followup two view chest.  Acromioclavicular joint degenerative changes.  IMPRESSION: Mild central pulmonary vascular prominence and top-normal heart size. Please see above.   Electronically Signed   By: Chauncey Cruel M.D.   On: 12/16/2013 17:28    CBC  Recent Labs Lab 12/16/13 0848  WBC 9.0  HGB 13.6  HCT 41.3  PLT 180  MCV 96.5  MCH 31.8  MCHC 32.9  RDW 14.1    Chemistries   Recent Labs Lab 12/16/13 0848  NA 143  K 4.4  CL 109  CO2  19  GLUCOSE 157*  BUN 28*  CREATININE 1.10  CALCIUM 8.6  AST 11  ALT 8  ALKPHOS 113  BILITOT 0.5   ------------------------------------------------------------------------------------------------------------------ CrCl is unknown because both a height and weight (above a minimum accepted value) are required for this calculation. ------------------------------------------------------------------------------------------------------------------  Recent Labs  12/16/13 0848  HGBA1C 7.2*   ------------------------------------------------------------------------------------------------------------------  Recent Labs  12/17/13 0600  CHOL 92  HDL 40  LDLCALC 40  TRIG 61  CHOLHDL 2.3   ------------------------------------------------------------------------------------------------------------------ No results found for this basename: TSH, T4TOTAL, FREET3, T3FREE, THYROIDAB,  in the last 72 hours ------------------------------------------------------------------------------------------------------------------ No results found for this basename: VITAMINB12, FOLATE, FERRITIN, TIBC, IRON, RETICCTPCT,  in the last 72 hours  Coagulation profile No  results found for this basename: INR, PROTIME,  in the last 168 hours  No results found for this basename: DDIMER,  in the last 72 hours  Cardiac Enzymes No results found for this basename: CK, CKMB, TROPONINI, MYOGLOBIN,  in the last 168 hours ------------------------------------------------------------------------------------------------------------------ No components found with this basename: POCBNP,      Time Spent in minutes   35   Thurnell Lose M.D on 12/17/2013 at 12:42 PM  Between 7am to 7pm - Pager - [phone]  After 7pm go to [email or web address] - password [email or web address]  And look for the night coverage person covering for me after hours  [facility] Office  [phone]

## 2013-12-17 NOTE — Progress Notes (Signed)
OT Cancellation Note  Patient Details Name: [name] MRN: [id number] DOB: [birth year]   Cancelled Treatment:    Reason Eval/Treat Not Completed: OT screened, no needs identified, will sign off.  Pt is Medicare and current D/C plan is SNF. No apparent immediate acute care OT needs, therefore will defer OT to SNF. If OT eval is needed please call Acute Rehab Dept. at [phone] or text page OT at [phone].    Benito Mccreedy OTR/L [phone] 12/17/2013, 11:13 AM

## 2013-12-17 NOTE — ED Provider Notes (Signed)
I saw and evaluated the patient, reviewed the resident's note and I agree with the findings and plan.   EKG Interpretation   Date/Time:  Monday Dec 16 2013 08:25:09 EDT Ventricular Rate:  80 PR Interval:  147 QRS Duration: 91 QT Interval:  396 QTC Calculation: 457 R Axis:   -38 Text Interpretation:  Sinus rhythm Left axis deviation `no acute st/t  changes compared w ecg 05/20/05 Confirmed by Ashok Cordia  MD, Lennette Bihari ([id number]) on  12/16/2013 8:33:22 AM      Pt noted by family to have decreased alertness and slurred speech this morning. Last seemed at baseline yesterday prior to bed. Afeb. Chest cta. abd soft nt. Labs. Ct.     Mirna Mires, MD 12/17/13 1116

## 2013-12-17 NOTE — Progress Notes (Signed)
Patient very uncooperative & confused at times, alert only to self.  Very odorous urine, large amounts incontinent.  Adult briefs on,  Sacral skin and perineal skin intact.

## 2013-12-17 NOTE — Evaluation (Signed)
Physical Therapy Evaluation Patient Details Name: [name] MRN: [id number] DOB: Nov 25, 1932 Today's Date: 12/17/2013   History of Present Illness  80 y.o. male admitted to [facility] on 12/16/13 after granddaughter found him on the floor by the bed.  Per chart he had garbeled speech and progressive right upper extremity weakness.  Stroke workup in progress as well as assessment for seizure (pt refused EEG).  CT scan revealed, "Hypoattenuation of the left frontal and right posterior watershed regions represents an interval change compared 04/15/2013 consistent with interval ischemic insults, however both regions have an appearance suggestive of subacute to chronic processes."  Pt with significant PMHx of bil BKA, DM, HTN, CHF, CAD, s/p burr hole for SDH 02/2013.    Clinical Impression  Pt is resistant to all movement even bed mobility.  He is total assist and I was unable to by myself even get him sitting EOB as he is pushing hard posteriorly.  Pt's wife is no longer able to care for him at this level at home.  Recommend SNF placement at discharge. Wife reports they have been to [facility] before.   PT to follow acutely for deficits listed below.       Follow Up Recommendations SNF    Equipment Recommendations  None recommended by PT    Recommendations for Other Services   NA    Precautions / Restrictions Precautions Precautions: Fall Precaution Comments: h/o falls      Mobility  Bed Mobility Overal bed mobility: Needs Assistance Bed Mobility: Rolling;Supine to Sit Rolling: Total assist   Supine to sit: Total assist     General bed mobility comments: Rolling bil total assist, attempted supine to sit two different ways and pt unalbe to get to sitting.  He is pushing posteriorly resisting movement throughout session.  Wife reports this is what he has been doing at home too and how she hurt herself.   Transfers                 General transfer comment: Would need hoyer lift to  get up OOB.  Not safe to be in recliner chair at this time, so repositioned pt in the bed on his left side for pressure relief.   Modified Rankin (Stroke Patients Only) Modified Rankin (Stroke Patients Only) Pre-Morbid Rankin Score: Severe disability Modified Rankin: Severe disability     Balance Overall balance assessment: History of Falls                                           Pertinent Vitals/Pain See vitals flow sheet.     Home Living Family/patient expects to be discharged to:: Skilled nursing facility   Available Help at Discharge: Family;Available 24 hours/day (wife, granddaughter) Type of Home: House Home Access: Ramped entrance     Home Layout: One level Home Equipment: Wheelchair - power;Hospital bed;Other (comment);Walker - 2 wheels (hoyer lift) Additional Comments: Per wife she can no longer handle him, he is resistant to rolling and sitting and she has injured her shoulder trying to help him byherself on the weekends.      Prior Function Level of Independence: Needs assistance   Gait / Transfers Assistance Needed: unable to walk.  At his best he could assist with sitting, they would put bil leg prosthesis on and he could be assisted with scooting to [facility], but here for the past two or  so weeks wife reports the only way they can get him up is with the hoyer lift.   ADL's / Homemaking Assistance Needed: total assist  Comments: Per wife, she has an aide that comes 5 days per week for 3 hours per day.  She cares for her husband alone on the weekend and during the week granddaughter helps out intermittently.      Hand Dominance   Dominant Hand: Right    Extremity/Trunk Assessment   Upper Extremity Assessment: RUE deficits/detail RUE Deficits / Details: right arm with grossly 2/5 strength , but unable to maintain grip or movement against gravity without assist from therapist.   RUE:  (difficult to assess due to cognition)       Lower  Extremity Assessment: RLE deficits/detail;LLE deficits/detail RLE Deficits / Details: h/o bil BKA.  Pt does actively seem to move both legs while mobilizing in the bed, but not to command.  Difficult to get a true assessment of his leg strength due to cognition.   LLE Deficits / Details: h/o bil BKA.  Pt does actively seem to move both legs while mobilizing in the bed, but not to command.  Difficult to get a true assessment of his leg strength due to cognition.    Cervical / Trunk Assessment: Other exceptions  Communication   Communication: Other (comment) (mumbling)  Cognition Arousal/Alertness: Awake/alert Behavior During Therapy: Agitated;Anxious Overall Cognitive Status: Impaired/Different from baseline Area of Impairment: Orientation;Attention;Memory;Following commands;Safety/judgement;Awareness;Problem solving Orientation Level: Disoriented to;Place;Time;Situation Current Attention Level: Focused Memory: Decreased short-term memory Following Commands: Follows one step commands inconsistently Safety/Judgement: Decreased awareness of deficits;Decreased awareness of safety Awareness: Intellectual Problem Solving: Decreased initiation General Comments: Pt very anxious about moving, resistant to even rolling, gets increasingly aggitated with attempts to sit EOB.              Assessment/Plan    PT Assessment Patient needs continued PT services  PT Diagnosis Generalized weakness;Acute pain;Hemiplegia dominant side;Altered mental status   PT Problem List Decreased strength;Decreased activity tolerance;Decreased balance;Decreased mobility;Decreased cognition;Decreased knowledge of use of DME;Decreased safety awareness;Decreased knowledge of precautions;Pain  PT Treatment Interventions DME instruction;Functional mobility training;Therapeutic activities;Therapeutic exercise;Balance training;Neuromuscular re-education;Cognitive remediation;Patient/family education;Wheelchair mobility  training;Modalities   PT Goals (Current goals can be found in the Care Plan section) Acute Rehab PT Goals Patient Stated Goal: wife would like to seek SNF placement PT Goal Formulation: With family Time For Goal Achievement: 12/31/13 Potential to Achieve Goals: Fair    Frequency Min 2X/week (usually strokes are 3x, but he was so low level before)   Barriers to discharge Decreased caregiver support pt's wife reports she can no longer care for him at this level of assistance and would like to seek SNF placement at d/c       End of Session   Activity Tolerance: Treatment limited secondary to agitation Patient left: in bed;with bed alarm set;with call bell/phone within reach;with family/visitor present           Time: 8563-1497 PT Time Calculation (min): 23 min   Charges:   PT Evaluation $Initial PT Evaluation Tier I: 1 Procedure PT Treatments $Therapeutic Activity: 8-22 mins        Kaleth Koy B. [place], [place], DPT [phone]   12/17/2013, 11:00 AM

## 2013-12-17 NOTE — Clinical Documentation Improvement (Signed)
Cardiology/Medicine,  Presented for Hardware Removal and Bipolar Arthroplasty of Hip. Post Op patient became:   Hypotensive 97/45 with Acute Bradycardia - Cardiac Consult Note  Hemoglobin dropped from 11 to 8  Received 2 units PRBC's  Described as Hypovolemic; may have 3rd spacing - Cardiac Consult  Started on Levophed  Transferred to ICU setting  Please provide a diagnosis associated with the above clinical data and treatment provided.  Cardiogenic Shock Hypovolemic Shock Hemorrhagic Shock Other Condition  Thank You, Zoila Shutter ,RN Clinical Documentation Specialist:  [phone]  [facility] [employer] [facility]

## 2013-12-17 NOTE — Progress Notes (Signed)
Speech Language Pathology Treatment: Dysphagia  Patient Details Name: [name] MRN: [id number] DOB: 06/30/1933 Today's Date: 12/17/2013 Time: 4627-0350 SLP Time Calculation (min): 9 min  Assessment / Plan / Recommendation Clinical Impression  Pt seen for f/u dysphagia treatment, with Max encouragement provided by SLP for participation. Pt consumed minimal cup sips of thin liquids with no overt s/s of aspiration or dysphagia. He declined attempts at solid PO trials. Pt developed low grade fevers overnight, however this morning is afebrile per chart. Will continue to monitor closely.   HPI HPI: [name] is an 80 y.o. male with history of right SDH S/P bur hole for evacuation for a large chronic subdural hematoma with marked mass effect. Patient also has history of seizures which he currently is on Keppra.  Per wife he is wheel chair bound, has recently shown increased memory decline, able to feed himself but otherwise needs assistance with all ADL's.  He has a Home health CNA that cares for him.  Pt has a h/o dysphagia reportedly requiring thickened liquids, with MBS 03/07/13 revealingsilent aspiration before the swallow. Pt was recommended to consume regular textures and thin liquids with a chin tuck. Per pt's family, he was cleared by SLP at [facility] for regular textures and thin lqiuids without need for chin tuck.    Pertinent Vitals N/A  SLP Plan  Continue with current plan of care    Recommendations Diet recommendations: Regular;Thin liquid Liquids provided via: Cup;No straw Medication Administration: Whole meds with puree Supervision: Patient able to self feed;Staff to assist with self feeding;Full supervision/cueing for compensatory strategies Compensations: Slow rate;Small sips/bites Postural Changes and/or Swallow Maneuvers: Seated upright 90 degrees              Oral Care Recommendations: Oral care BID Follow up Recommendations: None (none anticipated at this time for  swallowing) Plan: Continue with current plan of care    GO      [name], M.A. CCC-SLP [phone]  [name] 12/17/2013, 10:22 AM

## 2013-12-17 NOTE — Progress Notes (Signed)
EEG Completed; Results Pending  

## 2013-12-17 NOTE — Progress Notes (Signed)
Transport to pick up pt for EEG, pt refusing.  EEG procedure discussed with pt, he still refused, stating that "no one has business looking at my brain".  MD paged, will monitor. Also, pt pulled out IV, will attempt to restart when pt is more calm.

## 2013-12-17 NOTE — Progress Notes (Signed)
Pt has been seen for SNF placement. Assessment completed with spouse who prefers placement at [facility].  Full assessment to follow.  Hunt Oris, MSW, [place]

## 2013-12-17 NOTE — Progress Notes (Signed)
Spoke with pt family about pt refusing EEG this am, got permission for sedation meds.  EEG called, they are aware and will try to get pt down after appx 2 hours from now.

## 2013-12-17 NOTE — Procedures (Signed)
ELECTROENCEPHALOGRAM REPORT   Patient: [name]       Room #: 0D98 EEG No. ID: [id number] Age: 80 y.o.        Sex: male Referring Physician: Candiss Norse Report Date:  12/17/2013        Interpreting Physician: Alexis Goodell  History: [name] is an 80 y.o. male with altered mental status  Medications:  Scheduled: . aspirin  300 mg Rectal Daily   Or  . aspirin  325 mg Oral Daily  . darifenacin  7.5 mg Oral Daily  . enoxaparin (LOVENOX) injection  40 mg Subcutaneous Q24H  . ezetimibe  10 mg Oral Daily  . insulin aspart  0-5 Units Subcutaneous QHS  . insulin aspart  0-9 Units Subcutaneous TID WC  . insulin glargine  8 Units Subcutaneous Q breakfast  . levETIRAcetam  500 mg Oral BID  . simvastatin  40 mg Oral QHS  . tamsulosin  0.4 mg Oral Daily    Conditions of Recording:  This is a 16 channel EEG carried out with the patient in the drowsy state.  Description:  The background activity is slow and consists of a low to moderate voltage polymorphic delta activity.  This activity is continuous and present in all quadrants.  There is superimposed well organized beta activity of a morphology consistent with symmetrical sleep spindles.   The patient was alerted by the wife during the tracing at which time faster frequencies were apparent.   Hyperventilation and intermittent photic stimulation were not performed.  IMPRESSION: This is a normal drowsy electroencephalogram.  No epileptiform activity is noted.     Alexis Goodell, MD [facility] [phone] 12/17/2013, 6:12 PM

## 2013-12-18 DIAGNOSIS — I517 Cardiomegaly: Secondary | ICD-10-CM

## 2013-12-18 LAB — GLUCOSE, CAPILLARY
GLUCOSE-CAPILLARY: 176 mg/dL — AB (ref 70–99)
GLUCOSE-CAPILLARY: 189 mg/dL — AB (ref 70–99)
GLUCOSE-CAPILLARY: 120 mg/dL — AB (ref 70–99)
Glucose-Capillary: 124 mg/dL — ABNORMAL HIGH (ref 70–99)
Glucose-Capillary: 112 mg/dL — ABNORMAL HIGH (ref 70–99)

## 2013-12-18 MED ORDER — LORAZEPAM 2 MG/ML IJ SOLN
1.0000 mg | Freq: Once | INTRAMUSCULAR | Status: AC
  Administered 2013-12-19: 1 mg via INTRAVENOUS
  Filled 2013-12-18 (×2): qty 1

## 2013-12-18 NOTE — Progress Notes (Signed)
Speech Language Pathology Treatment: Dysphagia;Cognitive-Linquistic  Patient Details Name: [name] MRN: [id number] DOB: 1933/04/21 Today's Date: 12/18/2013 Time: 1062-6948 SLP Time Calculation (min): 18 min  Assessment / Plan / Recommendation Clinical Impression  Pt seen for f/u dysphagia and cognitive-linguistic treatment. Pt consumed regular textures and thin liquids via cup sips with Total A for self-feeding with no overt s/s of aspiration observed. Pt required Max cues for divergent naming task to generate 3 words per category. SLP provided Max cues for orientation to month. Continue to recommend SNF for further SLP services upon d/c.   HPI HPI: [name] is an 80 y.o. male with history of right SDH S/P bur hole for evacuation for a large chronic subdural hematoma with marked mass effect. Patient also has history of seizures which he currently is on Keppra.  Per wife he is wheel chair bound, has recently shown increased memory decline, able to feed himself but otherwise needs assistance with all ADL's.  He has a Home health CNA that cares for him.  Pt has a h/o dysphagia reportedly requiring thickened liquids, with MBS 03/07/13 revealingsilent aspiration before the swallow. Pt was recommended to consume regular textures and thin liquids with a chin tuck. Per pt's family, he was cleared by SLP at [facility] for regular textures and thin lqiuids without need for chin tuck.    Pertinent Vitals Afebrile  SLP Plan  Continue with current plan of care    Recommendations Diet recommendations: Regular;Thin liquid Liquids provided via: Cup;No straw Medication Administration: Whole meds with puree Supervision: Patient able to self feed;Staff to assist with self feeding;Full supervision/cueing for compensatory strategies Compensations: Slow rate;Small sips/bites Postural Changes and/or Swallow Maneuvers: Seated upright 90 degrees              Oral Care Recommendations: Oral care BID Follow up  Recommendations: Skilled Nursing facility;24 hour supervision/assistance Plan: Continue with current plan of care    GO      Germain Osgood, M.A. CCC-SLP [phone]  Germain Osgood 12/18/2013, 1:09 PM

## 2013-12-18 NOTE — Progress Notes (Signed)
Patient Demographics  [name], is a 80 y.o. male, DOB - 1933/02/04, WNI:[id number]  Admit date - 12/16/2013   Admitting Physician Ripudeep Krystal Eaton, MD  Outpatient Primary MD for the patient is Marton Redwood, MD  LOS - 2   Chief Complaint  Patient presents with  . Altered Mental Status        Assessment & Plan    Acute CVA (cerebral infarction)/TIA acute encephalopathy - for 3 weeks  - Will obtain MRI, MRA of the brain for further workup,  2-D echo, carotid Dopplers . -Head CT is stable, however exam consistent with left MCA territory stroke. Continue aspirin along with statin for secondary prevention. - Urine stable- minimize narcotics and sedating meds  - Neuro following     H/O SDH 1 yr ago -   on Keppra for preventing seizure, stable EEG this admission.     DIABETES MELLITUS   - Will place on sliding scale insulin once passed swallow testing  - Continue Lantus + ISS   Lab Results  Component Value Date   HGBA1C 7.4* 12/17/2013    CBG (last 3)   Recent Labs  12/17/13 1618 12/17/13 2126 12/18/13 0702  GLUCAP 218* 124* 112*       HYPERLIPIDEMIA  - Continue statins    Lab Results  Component Value Date   CHOL 92 12/17/2013   HDL 40 12/17/2013   LDLCALC 40 12/17/2013   TRIG 61 12/17/2013   CHOLHDL 2.3 12/17/2013      HYPERTENSION  - BP currently stable, borderline, hold antihypertensives , as needed IV Hydralazine    CAD  - No current cardiac symptoms     S/P bilateral BKA (below knee amputation)      Code Status: Full  Family Communication:  Wife 12-17-13 @ 12.50pm  Disposition Plan: TBD, may need placement   Procedures  MRI MRA brain, CT head, echo, carotid duplex, EEG   Consults  Neuro   Medications  Scheduled Meds: . aspirin   300 mg Rectal Daily   Or  . aspirin  325 mg Oral Daily  . darifenacin  7.5 mg Oral Daily  . enoxaparin (LOVENOX) injection  40 mg Subcutaneous Q24H  . ezetimibe  10 mg Oral Daily  . insulin aspart  0-5 Units Subcutaneous QHS  . insulin aspart  0-9 Units Subcutaneous TID WC  . insulin glargine  8 Units Subcutaneous Q breakfast  . levETIRAcetam  500 mg Oral BID  . simvastatin  40 mg Oral QHS  . tamsulosin  0.4 mg Oral Daily   Continuous Infusions: . sodium chloride 75 mL/hr at 12/17/13 0337   PRN Meds:.acetaminophen, acetaminophen, diphenhydrAMINE, haloperidol lactate, senna-docusate  DVT Prophylaxis  Lovenox    Lab Results  Component Value Date   PLT 180 12/16/2013    Antibiotics     Anti-infectives   None          Subjective:   [name] today has, No headache, No chest pain, No abdominal pain - No Nausea, No new weakness tingling or numbness, No Cough - SOB. Pleasantly confused but answers all questions.  Objective:   Filed Vitals:   12/17/13 1528 12/17/13 1753 12/17/13 2127 12/18/13 0513  BP: 102/54 106/46 132/63 110/57  Pulse:  75 76 75  Temp:  97.8 F (36.6 C) 98.1 F (36.7 C) 97.6 F (36.4 C)  TempSrc:  Oral  Axillary  Resp:  16 18 18   SpO2:  96% 93% 98%    Wt Readings from Last 3 Encounters:  03/18/13 102.1 kg (225 lb 1.4 oz)  03/18/13 102.1 kg (225 lb 1.4 oz)  03/18/13 102.1 kg (225 lb 1.4 oz)    No intake or output data in the 24 hours ending 12/18/13 0833   Physical Exam  Awake but confused, R. Arm 1/5 , Normal affect, can move his right leg stump [place].AT,PERRAL Supple Neck,No JVD, No cervical lymphadenopathy appriciated.  Symmetrical Chest wall movement, Good air movement bilaterally, CTAB RRR,No Gallops,Rubs or new Murmurs, No Parasternal Heave +ve B.Sounds, Abd Soft, Non tender, No organomegaly appriciated, No rebound - guarding or rigidity. No Cyanosis, Clubbing or edema, No new Rash or bruise , Bilat BKA     Data Review    Micro Results No results found for this or any previous visit (from the past 240 hour(s)).  Radiology Reports Ct Head Wo Contrast  12/16/2013   CLINICAL DATA:  Dysarthria, right facial droop  EXAM: CT HEAD WITHOUT CONTRAST  TECHNIQUE: Contiguous axial images were obtained from the base of the skull through the vertex without intravenous contrast.  COMPARISON:  Prior head CT 04/15/2013  FINDINGS: Resolving heterogeneous extra-axial collection overlying the right frontal convexity consistent with a chronic subdural hematoma/ hygroma. The collection is smaller compared to 04/15/2013. New ill-defined hypoattenuation extending to the cortex in the left frontal watershed region which is an interval finding compared to 04/15/2013. There appears to be some associated encephalomalacia and is favored to be subacute to remote. Similarly, there is a new focus of hypoattenuation in the right posterior watershed region which also has an appearance suggestive of a subacute to remote process. Additionally, there is global cerebral and cerebellar atrophy and sequelae of chronic microvascular ischemic white matter disease. No focal soft tissue or calvarial abnormality. Normal aeration of the mastoid air cells and paranasal sinuses. Burr hole defect in the right frontal calvarium.  IMPRESSION: 1. No definite acute intracranial abnormality. 2. Hypoattenuation of the left frontal and right posterior watershed regions represents an interval change compared 04/15/2013 consistent with interval ischemic insults, however both regions have an appearance suggestive of subacute to chronic processes. 3. Slight interval improvement in chronic right frontal subdural hematoma/hygroma. 4. Atrophy and chronic microvascular ischemic white matter disease.   Electronically Signed   By: Jacqulynn Cadet M.D.   On: 12/16/2013 09:30   Dg Chest Port 1 View  12/16/2013   CLINICAL DATA:  Right-side weakness. Confusion. Diabetic hypertensive  patient. History of testicular cancer.  EXAM: PORTABLE CHEST - 1 VIEW  COMPARISON:  03/04/2013.  FINDINGS: Heart size top-normal.  Mild central pulmonary vascular prominence.  Evaluation lung bases slightly limited.  No segmental consolidation.  No plain film evidence of pulmonary malignancy detected on this decreased inspiratory portable exam. Patient would eventually benefit from followup two view chest.  Acromioclavicular joint degenerative changes.  IMPRESSION: Mild central pulmonary vascular prominence and top-normal heart size. Please see above.   Electronically Signed   By: Chauncey Cruel M.D.   On: 12/16/2013 17:28    CBC  Recent Labs Lab 12/16/13 0848  WBC 9.0  HGB 13.6  HCT 41.3  PLT 180  MCV 96.5  MCH 31.8  MCHC 32.9  RDW 14.1    Chemistries   Recent Labs Lab  12/16/13 0848  NA 143  K 4.4  CL 109  CO2 19  GLUCOSE 157*  BUN 28*  CREATININE 1.10  CALCIUM 8.6  AST 11  ALT 8  ALKPHOS 113  BILITOT 0.5   ------------------------------------------------------------------------------------------------------------------ CrCl is unknown because both a height and weight (above a minimum accepted value) are required for this calculation. ------------------------------------------------------------------------------------------------------------------  Recent Labs  12/16/13 0848 12/17/13 0600  HGBA1C 7.2* 7.4*   ------------------------------------------------------------------------------------------------------------------  Recent Labs  12/17/13 0600  CHOL 92  HDL 40  LDLCALC 40  TRIG 61  CHOLHDL 2.3   ------------------------------------------------------------------------------------------------------------------ No results found for this basename: TSH, T4TOTAL, FREET3, T3FREE, THYROIDAB,  in the last 72 hours ------------------------------------------------------------------------------------------------------------------ No results found for this basename:  VITAMINB12, FOLATE, FERRITIN, TIBC, IRON, RETICCTPCT,  in the last 72 hours  Coagulation profile No results found for this basename: INR, PROTIME,  in the last 168 hours  No results found for this basename: DDIMER,  in the last 72 hours  Cardiac Enzymes No results found for this basename: CK, CKMB, TROPONINI, MYOGLOBIN,  in the last 168 hours ------------------------------------------------------------------------------------------------------------------ No components found with this basename: POCBNP,      Time Spent in minutes   35   Thurnell Lose M.D on 12/18/2013 at 8:33 AM  Between 7am to 7pm - Pager - [phone]  After 7pm go to [email or web address] - password [email or web address]  And look for the night coverage person covering for me after hours  [facility] Office  [phone]

## 2013-12-18 NOTE — Progress Notes (Addendum)
error 

## 2013-12-18 NOTE — Progress Notes (Signed)
VASCULAR LAB PRELIMINARY  PRELIMINARY  PRELIMINARY  PRELIMINARY  Carotid duplex completed.    Preliminary report:  1-39% right ICA stenosis.  Left ICA occluded.  Right vertebral artery flow antegrade.  Left vertebral artery not insonate.  Margarette Canada, RVT 12/18/2013, 3:44 PM

## 2013-12-18 NOTE — Progress Notes (Addendum)
Clinical Social Work Department CLINICAL SOCIAL WORK PLACEMENT NOTE 12/18/2013  Patient:  [name], [name]  Account Number:  [id number] Admit date:  12/16/2013  Clinical Social Worker:  Hunt Oris, Latanya Presser  Date/time:  12/18/2013 09:08 AM  Clinical Social Work is seeking post-discharge placement for this patient at the following level of care:   SKILLED NURSING   (*CSW will update this form in Epic as items are completed)   12/18/2013  Patient/family provided with [place] Department of Clinical Social Work's list of facilities offering this level of care within the geographic area requested by the patient (or if unable, by the patient's family).  12/18/2013  Patient/family informed of their freedom to choose among providers that offer the needed level of care, that participate in Medicare, Medicaid or managed care program needed by the patient, have an available bed and are willing to accept the patient.  12/18/2013  Patient/family informed of [facility]' ownership interest in [facility], as well as of the fact that they are under no obligation to receive care at this facility.  PASARR submitted to [employer] on  PASARR number received from [place] on   FL2 transmitted to all facilities in geographic area requested by pt/family on  12/18/2013 FL2 transmitted to all facilities within larger geographic area on   Patient informed that his/her managed care company has contracts with or will negotiate with  certain facilities, including the following:     Patient/family informed of bed offers received:   Patient chooses bed at [facility] Physician recommends and patient chooses bed at    Patient to be transferred to  on  12/25/2013 Patient to be transferred to facility by [facility]  The following physician request were entered in Epic:   Additional Comments: Wife prefers placement at [facility].  Hunt Oris, MSW, [place]

## 2013-12-18 NOTE — Progress Notes (Signed)
Clinical Social Work Department BRIEF PSYCHOSOCIAL ASSESSMENT 12/18/2013  Patient:  [name], [name]     Account Number:  [id number]     Admit date:  12/16/2013  Clinical Social Worker:  Valda Lamb  Date/Time:  12/18/2013 09:00 AM  Referred by:  Physician  Date Referred:  12/18/2013 Referred for  SNF Placement   Other Referral:   Interview type:  Patient Other interview type:   CSW also completed assessment with pt wife.    PSYCHOSOCIAL DATA Living Status:  WIFE Admitted from facility:   Level of care:   Primary support name:  [name] Primary support relationship to patient:  SPOUSE Degree of support available:   Pt spouse presents as main support to pt.    CURRENT CONCERNS Current Concerns  Post-Acute Placement   Other Concerns:    SOCIAL WORK ASSESSMENT / PLAN CSW informed that pt will need ST SNF at dc.    CSW observed that pt is alert however only oriented to self. Pt wife present in the room and presents as main contact and support to pt. CSW informed pt and wife of PT recommendations for SNF placement. Both pt and wife agreed that pt can benefit from ST SNF placement at dc. Wife states pt was at [facility] previously however he did not like it very much. Wife would prefer placement at [facility].    Pt to do a SNF search and follow up with bed offers.   Assessment/plan status:  Psychosocial Support/Ongoing Assessment of Needs Other assessment/ plan:   Information/referral to community resources:   CSW provided pt wife with a SNF list and CSW contact information.    PATIENT'S/FAMILY'S RESPONSE TO PLAN OF CARE: Pt laying in bed alert and appeared to be oriented to person. Pt wife present and assessment completed with her.    CSW to follow up with bed offers.       Hunt Oris, MSW, [place]

## 2013-12-18 NOTE — Progress Notes (Signed)
Subjective: Continues to be about the same.   Exam: Filed Vitals:   12/18/13 1435  BP: 101/44  Pulse: 72  Temp: 97.3 F (36.3 C)  Resp: 18   Gen: In bed, NAD MS: Awake, alert, some difficulty both with commands and expression.  EV:[id number], does not reliably blink to threat from either side, but does fixate and track. Gives incorrect finger # in all fields.  Motor: R hemiparesis.  Sensory:pt reports equal    Impression: 80 yo M with 1 month history of confusion and right sided weakness. I suspect that he has a mild aphasia and weakness due to his left infarct. The posterior contralateral infarct likely would have worsened confusion. Also possible would be other causes of edema on CT, e.g. Tumor, etc.   Recommendations: 1) MRI brain  2) stroke workup pendign.  3) continue asa  Roland Rack, MD [facility] [phone]  If 7pm- 7am, please page neurology on call as listed in [place].

## 2013-12-18 NOTE — Progress Notes (Signed)
Patient refused 2200 meds, stating that he is not "gonna take no shit." Explaining risks of declining meds and patient declined. Will continue to monitor.

## 2013-12-18 NOTE — Progress Notes (Signed)
Echocardiogram 2D Echocardiogram has been performed.  Alyson Locket Ladarrian Asencio 12/18/2013, 8:38 AM

## 2013-12-18 NOTE — Progress Notes (Signed)
Nutrition Brief Note  Patient identified on the Malnutrition Screening Tool (MST) Report  Wt Readings from Last 15 Encounters:  03/18/13 225 lb 1.4 oz (102.1 kg)  03/18/13 225 lb 1.4 oz (102.1 kg)  03/18/13 225 lb 1.4 oz (102.1 kg)  07/19/11 258 lb 11.2 oz (117.346 kg)  01/29/08 210 lb (95.255 kg)  12/25/07 210 lb (95.255 kg)   Pt denies any recent weight loss and states that his most recent body weight was 220 lbs. Pt reports having a good appetite and eating well. No recent weight on file to calculate BMI.  Current diet order is Carb modified. Patient reports consuming approximately 100% of meals at this time. Pt seems slightly confused at time of visit. He denies any nutrition related questions or concerns. Labs and medications reviewed.   No nutrition interventions warranted at this time. If nutrition issues arise, please consult RD.   Pryor Ochoa RD, LDN Inpatient Clinical Dietitian Pager: [phone] After Hours Pager: [phone]

## 2013-12-19 ENCOUNTER — Inpatient Hospital Stay (HOSPITAL_COMMUNITY): Payer: Medicare Other

## 2013-12-19 LAB — GLUCOSE, CAPILLARY
GLUCOSE-CAPILLARY: 180 mg/dL — AB (ref 70–99)
GLUCOSE-CAPILLARY: 146 mg/dL — AB (ref 70–99)
GLUCOSE-CAPILLARY: 195 mg/dL — AB (ref 70–99)
GLUCOSE-CAPILLARY: 175 mg/dL — AB (ref 70–99)

## 2013-12-19 NOTE — Progress Notes (Signed)
Chart reviewed after request for rehabilitation council. OT evaluation on 12/17/2013 did not demonstrate any OT needs. PT eval recommending SNF, poor participation. Referral rehabilitation consult deferred.

## 2013-12-19 NOTE — Progress Notes (Addendum)
Patient Demographics  [name], is a 80 y.o. male, DOB - 1933/03/02, VOZ:[id number]  Admit date - 12/16/2013   Admitting Physician Ripudeep Krystal Eaton, MD  Outpatient Primary MD for the patient is Marton Redwood, MD  LOS - 3   Chief Complaint  Patient presents with  . Altered Mental Status        Assessment & Plan    Acute CVA (cerebral infarction)/TIA acute encephalopathy - for 3 weeks  - Be awake MRI, MRA of the brain , stable 2-D echo and carotid Dopplers . -Head CT is stable, however exam consistent with left MCA territory stroke. Continue aspirin along with statin for secondary prevention. - Neuro following, TEE + Loop per Neuro     H/O SDH 1 yr ago -   on Keppra for preventing seizure, stable EEG this admission.     DIABETES MELLITUS   - Will place on sliding scale insulin once passed swallow testing  - Continue Lantus + ISS   Lab Results  Component Value Date   HGBA1C 7.4* 12/17/2013    CBG (last 3)   Recent Labs  12/18/13 1652 12/18/13 2022 12/19/13 0638  GLUCAP 189* 120* 180*       HYPERLIPIDEMIA  - Continue statins    Lab Results  Component Value Date   CHOL 92 12/17/2013   HDL 40 12/17/2013   LDLCALC 40 12/17/2013   TRIG 61 12/17/2013   CHOLHDL 2.3 12/17/2013      HYPERTENSION  - BP currently stable, borderline, hold antihypertensives , as needed IV Hydralazine    CAD  - No current cardiac symptoms     S/P bilateral BKA (below knee amputation)      Code Status: Full  Family Communication:  Wife 12-17-13 @ 12.50pm  Disposition Plan: TBD, may need placement   Procedures     MRI MRA brain, CT head, echo, carotid duplex, EEG, TEE, loop recorder  Echo  - Left ventricle: Technically limited study. Hypokinesis of the inferior and  inferolateral walls. The estimated ejection fraction was 50%. Doppler parameters are consistent with abnormal left ventricular relaxation (grade 1 diastolic dysfunction). - Aortic valve: Significant thickening with calcium of the aortic leaflets. Data obtained does not show significant increase in gradient. However, there is some aortic stenosis. - Left atrium: The atrium was moderately dilated. - Right ventricle: The cavity size was normal. Systolic function was normal. - Impressions: No cardiac source of embolism was identified, but cannot be ruled out on the basis of this examination. Impressions:  - No cardiac source of embolism was identified, but cannot be ruled out on the basis of this examination.    Carotids  Preliminary report: 1-39% right ICA stenosis. Left ICA occluded. Right vertebral artery flow antegrade. Left vertebral artery not insonate.     Consults  Neuro   Medications  Scheduled Meds: . aspirin  300 mg Rectal Daily   Or  . aspirin  325 mg Oral Daily  . darifenacin  7.5 mg Oral Daily  . enoxaparin (LOVENOX) injection  40 mg Subcutaneous Q24H  . ezetimibe  10 mg Oral Daily  . insulin aspart  0-5 Units Subcutaneous QHS  . insulin aspart  0-9 Units Subcutaneous TID WC  . insulin  glargine  8 Units Subcutaneous Q breakfast  . levETIRAcetam  500 mg Oral BID  . LORazepam  1 mg Intravenous Once  . simvastatin  40 mg Oral QHS  . tamsulosin  0.4 mg Oral Daily   Continuous Infusions: . sodium chloride 75 mL/hr at 12/17/13 0337   PRN Meds:.acetaminophen, acetaminophen, diphenhydrAMINE, haloperidol lactate, senna-docusate  DVT Prophylaxis  Lovenox    Lab Results  Component Value Date   PLT 180 12/16/2013    Antibiotics     Anti-infectives   None          Subjective:   [name] today has, No headache, No chest pain, No abdominal pain - No Nausea, No new weakness tingling or numbness, No Cough - SOB. Pleasantly confused but answers all  questions. Has agreed to take medications and go for MRI today.  Objective:   Filed Vitals:   12/19/13 0000 12/19/13 0135 12/19/13 0400 12/19/13 0551  BP:  148/70  135/82  Pulse:  87  97  Temp:  99.7 F (37.6 C)  99.8 F (37.7 C)  TempSrc:  Oral  Oral  Resp: 14 18 17 18   SpO2:  97%  97%    Wt Readings from Last 3 Encounters:  03/18/13 102.1 kg (225 lb 1.4 oz)  03/18/13 102.1 kg (225 lb 1.4 oz)  03/18/13 102.1 kg (225 lb 1.4 oz)    No intake or output data in the 24 hours ending 12/19/13 0842   Physical Exam  Awake but confused, R. Arm 1/5 , Normal affect, can move his right leg stump [place].AT,PERRAL Supple Neck,No JVD, No cervical lymphadenopathy appriciated.  Symmetrical Chest wall movement, Good air movement bilaterally, CTAB RRR,No Gallops,Rubs or new Murmurs, No Parasternal Heave +ve B.Sounds, Abd Soft, Non tender, No organomegaly appriciated, No rebound - guarding or rigidity. Wearing a condom catheter. No Cyanosis, Clubbing or edema, No new Rash or bruise , Bilat BKA     Data Review   Micro Results No results found for this or any previous visit (from the past 240 hour(s)).  Radiology Reports Ct Head Wo Contrast  12/16/2013   CLINICAL DATA:  Dysarthria, right facial droop  EXAM: CT HEAD WITHOUT CONTRAST  TECHNIQUE: Contiguous axial images were obtained from the base of the skull through the vertex without intravenous contrast.  COMPARISON:  Prior head CT 04/15/2013  FINDINGS: Resolving heterogeneous extra-axial collection overlying the right frontal convexity consistent with a chronic subdural hematoma/ hygroma. The collection is smaller compared to 04/15/2013. New ill-defined hypoattenuation extending to the cortex in the left frontal watershed region which is an interval finding compared to 04/15/2013. There appears to be some associated encephalomalacia and is favored to be subacute to remote. Similarly, there is a new focus of hypoattenuation in the right posterior  watershed region which also has an appearance suggestive of a subacute to remote process. Additionally, there is global cerebral and cerebellar atrophy and sequelae of chronic microvascular ischemic white matter disease. No focal soft tissue or calvarial abnormality. Normal aeration of the mastoid air cells and paranasal sinuses. Burr hole defect in the right frontal calvarium.  IMPRESSION: 1. No definite acute intracranial abnormality. 2. Hypoattenuation of the left frontal and right posterior watershed regions represents an interval change compared 04/15/2013 consistent with interval ischemic insults, however both regions have an appearance suggestive of subacute to chronic processes. 3. Slight interval improvement in chronic right frontal subdural hematoma/hygroma. 4. Atrophy and chronic microvascular ischemic white matter disease.   Electronically Signed  By: Jacqulynn Cadet M.D.   On: 12/16/2013 09:30   Dg Chest Port 1 View  12/16/2013   CLINICAL DATA:  Right-side weakness. Confusion. Diabetic hypertensive patient. History of testicular cancer.  EXAM: PORTABLE CHEST - 1 VIEW  COMPARISON:  03/04/2013.  FINDINGS: Heart size top-normal.  Mild central pulmonary vascular prominence.  Evaluation lung bases slightly limited.  No segmental consolidation.  No plain film evidence of pulmonary malignancy detected on this decreased inspiratory portable exam. Patient would eventually benefit from followup two view chest.  Acromioclavicular joint degenerative changes.  IMPRESSION: Mild central pulmonary vascular prominence and top-normal heart size. Please see above.   Electronically Signed   By: Chauncey Cruel M.D.   On: 12/16/2013 17:28    CBC  Recent Labs Lab 12/16/13 0848  WBC 9.0  HGB 13.6  HCT 41.3  PLT 180  MCV 96.5  MCH 31.8  MCHC 32.9  RDW 14.1    Chemistries   Recent Labs Lab 12/16/13 0848  NA 143  K 4.4  CL 109  CO2 19  GLUCOSE 157*  BUN 28*  CREATININE 1.10  CALCIUM 8.6  AST 11    ALT 8  ALKPHOS 113  BILITOT 0.5   ------------------------------------------------------------------------------------------------------------------ CrCl is unknown because both a height and weight (above a minimum accepted value) are required for this calculation. ------------------------------------------------------------------------------------------------------------------  Recent Labs  12/16/13 0848 12/17/13 0600  HGBA1C 7.2* 7.4*   ------------------------------------------------------------------------------------------------------------------  Recent Labs  12/17/13 0600  CHOL 92  HDL 40  LDLCALC 40  TRIG 61  CHOLHDL 2.3   ------------------------------------------------------------------------------------------------------------------ No results found for this basename: TSH, T4TOTAL, FREET3, T3FREE, THYROIDAB,  in the last 72 hours ------------------------------------------------------------------------------------------------------------------ No results found for this basename: VITAMINB12, FOLATE, FERRITIN, TIBC, IRON, RETICCTPCT,  in the last 72 hours  Coagulation profile No results found for this basename: INR, PROTIME,  in the last 168 hours  No results found for this basename: DDIMER,  in the last 72 hours  Cardiac Enzymes No results found for this basename: CK, CKMB, TROPONINI, MYOGLOBIN,  in the last 168 hours ------------------------------------------------------------------------------------------------------------------ No components found with this basename: POCBNP,      Time Spent in minutes   35   Thurnell Lose M.D on 12/19/2013 at 8:42 AM  Between 7am to 7pm - Pager - [phone]  After 7pm go to [email or web address] - password [email or web address]  And look for the night coverage person covering for me after hours  [facility] Office  [phone]

## 2013-12-19 NOTE — Progress Notes (Signed)
Subjective: Continues to be about the same.   Exam: Filed Vitals:   12/19/13 0551  BP: 135/82  Pulse: 97  Temp: 99.8 F (37.7 C)  Resp: 18   Gen: In bed, NAD MS: Awake, alert, some difficulty both with commands and expression.  VP:XTGGY, does nto reliably work with VF.  Motor: R hemiparesis.  Sensory:pt reports equal   LDL 40 A1C 7.2  Impression: 80 yo M with 1 month history of confusion and right sided weakness. I suspect that he has a mild aphasia and weakness due to his left infarct. The posterior contralateral infarct likely would have worsened confusion. Less likely would be other causes of edema on CT, e.g. Tumor, etc. If MRI is ounable to be obtained, I feel strongly that this is stroke.   Recommendations: 1) MRI brain  2) TTE negative, would have high suspicion for embolism and therefore would pursue TEE. I have contacted cardiologist and will make the patient NPO. 3) continue asa 4) DM control  Roland Rack, MD [facility] [phone]  If 7pm- 7am, please page neurology on call as listed in [place].

## 2013-12-19 NOTE — Progress Notes (Signed)
UR complete.  Elivia Robotham RN, MSN 

## 2013-12-20 ENCOUNTER — Inpatient Hospital Stay (HOSPITAL_COMMUNITY): Payer: Medicare Other

## 2013-12-20 ENCOUNTER — Encounter (HOSPITAL_COMMUNITY)
Admission: EM | Disposition: A | Discharge status: Skilled Nursing Facility | Payer: Self-pay | Source: Home / Self Care | Attending: Internal Medicine

## 2013-12-20 ENCOUNTER — Encounter (HOSPITAL_COMMUNITY): Payer: Self-pay

## 2013-12-20 DIAGNOSIS — I219 Acute myocardial infarction, unspecified: Secondary | ICD-10-CM

## 2013-12-20 DIAGNOSIS — I517 Cardiomegaly: Secondary | ICD-10-CM

## 2013-12-20 HISTORY — PX: TEE WITHOUT CARDIOVERSION: SHX5443

## 2013-12-20 LAB — BLOOD GAS, ARTERIAL
ACID-BASE DEFICIT: 2.7 mmol/L — AB (ref 0.0–2.0)
BICARBONATE: 21.1 meq/L (ref 20.0–24.0)
Drawn by: 275531
FIO2: 0.21 %
O2 Saturation: 95 %
PATIENT TEMPERATURE: 98.6
PH ART: 7.418 (ref 7.350–7.450)
TCO2: 22.2 mmol/L (ref 0–100)
pCO2 arterial: 33.4 mmHg — ABNORMAL LOW (ref 35.0–45.0)
pO2, Arterial: 75.6 mmHg — ABNORMAL LOW (ref 80.0–100.0)

## 2013-12-20 LAB — GLUCOSE, CAPILLARY
GLUCOSE-CAPILLARY: 164 mg/dL — AB (ref 70–99)
GLUCOSE-CAPILLARY: 177 mg/dL — AB (ref 70–99)
Glucose-Capillary: 146 mg/dL — ABNORMAL HIGH (ref 70–99)
Glucose-Capillary: 197 mg/dL — ABNORMAL HIGH (ref 70–99)

## 2013-12-20 LAB — CBC
HEMATOCRIT: 39.8 % (ref 39.0–52.0)
HEMOGLOBIN: 13.2 g/dL (ref 13.0–17.0)
MCH: 31.7 pg (ref 26.0–34.0)
MCHC: 33.2 g/dL (ref 30.0–36.0)
MCV: 95.4 fL (ref 78.0–100.0)
Platelets: 208 10*3/uL (ref 150–400)
RBC: 4.17 MIL/uL — ABNORMAL LOW (ref 4.22–5.81)
RDW: 14.2 % (ref 11.5–15.5)
WBC: 7 10*3/uL (ref 4.0–10.5)

## 2013-12-20 LAB — BASIC METABOLIC PANEL
BUN: 25 mg/dL — AB (ref 6–23)
CHLORIDE: 107 meq/L (ref 96–112)
CO2: 20 mEq/L (ref 19–32)
CREATININE: 1.08 mg/dL (ref 0.50–1.35)
Calcium: 9 mg/dL (ref 8.4–10.5)
GFR calc Af Amer: 74 mL/min — ABNORMAL LOW (ref >90)
GFR, EST NON AFRICAN AMERICAN: 64 mL/min — AB (ref >90)
Glucose, Bld: 180 mg/dL — ABNORMAL HIGH (ref 70–99)
POTASSIUM: 4.6 meq/L (ref 3.7–5.3)
Sodium: 140 mEq/L (ref 137–147)

## 2013-12-20 LAB — AMMONIA: Ammonia: 38 umol/L (ref 11–60)

## 2013-12-20 LAB — TROPONIN I

## 2013-12-20 SURGERY — ECHOCARDIOGRAM, TRANSESOPHAGEAL
Anesthesia: Moderate Sedation

## 2013-12-20 MED ORDER — SODIUM CHLORIDE 0.9 % IV SOLN
INTRAVENOUS | Status: DC
  Administered 2013-12-20: 11:00:00 via INTRAVENOUS

## 2013-12-20 MED ORDER — MIDAZOLAM HCL 10 MG/2ML IJ SOLN
INTRAMUSCULAR | Status: DC | PRN
  Administered 2013-12-20: 2 mg via INTRAVENOUS
  Administered 2013-12-20: 1 mg via INTRAVENOUS

## 2013-12-20 MED ORDER — BUTAMBEN-TETRACAINE-BENZOCAINE 2-2-14 % EX AERO
INHALATION_SPRAY | CUTANEOUS | Status: DC | PRN
  Administered 2013-12-20: 2 via TOPICAL

## 2013-12-20 MED ORDER — FENTANYL CITRATE 0.05 MG/ML IJ SOLN
INTRAMUSCULAR | Status: DC | PRN
  Administered 2013-12-20: 25 ug via INTRAVENOUS

## 2013-12-20 MED ORDER — SODIUM CHLORIDE 0.9 % IV SOLN
INTRAVENOUS | Status: DC
  Administered 2013-12-20 – 2013-12-21 (×2): via INTRAVENOUS

## 2013-12-20 MED ORDER — FENTANYL CITRATE 0.05 MG/ML IJ SOLN
INTRAMUSCULAR | Status: AC
  Filled 2013-12-20: qty 2

## 2013-12-20 MED ORDER — MIDAZOLAM HCL 5 MG/ML IJ SOLN
INTRAMUSCULAR | Status: AC
  Filled 2013-12-20: qty 2

## 2013-12-20 NOTE — Interval H&P Note (Signed)
History and Physical Interval Note:  12/20/2013 12:18 PM  [name]  has presented today for surgery, with the diagnosis of STROKE  The various methods of treatment have been discussed with the patient and family. After consideration of risks, benefits and other options for treatment, the patient has consented to  Procedure(s): TRANSESOPHAGEAL ECHOCARDIOGRAM (TEE) (N/A) as a surgical intervention .  The patient's history has been reviewed, patient examined, no change in status, stable for surgery.  I have reviewed the patient's chart and labs.  Questions were answered to the patient's satisfaction.     Lelon Perla

## 2013-12-20 NOTE — Care Management Note (Unsigned)
    Page 1 of 1   12/20/2013     10:29:32 AM CARE MANAGEMENT NOTE 12/20/2013  Patient:  ZIYAD, DYAR   Account Number:  [id number]  Date Initiated:  12/19/2013  Documentation initiated by:  Lorne Skeens  Subjective/Objective Assessment:   Patient admitted with CVA. Lives at home with wife.     Action/Plan:   Will follow for discharge needs pending PT/OT evals and physician orders.   Anticipated DC Date:  12/23/2013   Anticipated DC Plan:  SKILLED NURSING FACILITY  In-house referral  Clinical Social Worker         Choice offered to / List presented to:             Status of service:  In process, will continue to follow Medicare Important Message given?  YES (If response is "NO", the following Medicare IM given date fields will be blank) Date Medicare IM given:   Date Additional Medicare IM given:  12/20/2013  Discharge Disposition:    Per UR Regulation:  Reviewed for med. necessity/level of care/duration of stay  If discussed at [place] of Stay Meetings, dates discussed:    Comments:  12/20/13 [place], MSN, CM- Medicare IM left at beside. Patient unable to sign and no family available at this time.

## 2013-12-20 NOTE — Progress Notes (Signed)
Patient is active with [facility]. [facility] LCSW was also trying to get patient into SNF prior to admission. Appears discharge plan is for SNF. Will continue to follow and will make inpatient staff know patient is active with [facility].  Marthenia Rolling, MSN- [facility] [phone]

## 2013-12-20 NOTE — Progress Notes (Signed)
Patient Demographics  [name], is a 80 y.o. male, DOB - 07/17/1933, OFB:[id number]  Admit date - 12/16/2013   Admitting Physician [name] Krystal Eaton, MD  Outpatient Primary MD for the patient is [name] Redwood, MD  LOS - 4   Chief Complaint  Patient presents with  . Altered Mental Status        Assessment & Plan    Acute CVA (cerebral infarction)/TIA acute encephalopathy - for 3 weeks  - Stable 2-D echo and carotid Dopplers . -Head CT is stable, however exam consistent with left MCA territory stroke. Continue aspirin along with statin for secondary prevention. MRI could not be done attempted x3 due to patient noncooperation. - Neuro following, TEE + Loop per Neuro     H/O SDH 1 yr ago -   on Keppra for preventing seizure, stable EEG this admission.     DIABETES MELLITUS   - Will place on sliding scale insulin once passed swallow testing  - Continue Lantus + ISS   Lab Results  Component Value Date   HGBA1C 7.4* 12/17/2013    CBG (last 3)   Recent Labs  12/19/13 1655 12/19/13 2128 12/20/13 0705  GLUCAP 195* 175* 164*       HYPERLIPIDEMIA  - Continue statins    Lab Results  Component Value Date   CHOL 92 12/17/2013   HDL 40 12/17/2013   LDLCALC 40 12/17/2013   TRIG 61 12/17/2013   CHOLHDL 2.3 12/17/2013      HYPERTENSION  - BP currently stable, borderline, hold antihypertensives , as needed IV Hydralazine    CAD  - No current cardiac symptoms     S/P bilateral BKA (below knee amputation)      Code Status: Full  Family Communication:  Wife 12-17-13 @ 12.50pm  Disposition Plan: TBD, may need placement   Procedures     MRI MRA brain, CT head, echo, carotid duplex, EEG, TEE, loop recorder  Echo  - Left ventricle: Technically limited study.  Hypokinesis of the inferior and inferolateral walls. The estimated ejection fraction was 50%. Doppler parameters are consistent with abnormal left ventricular relaxation (grade 1 diastolic dysfunction). - Aortic valve: Significant thickening with calcium of the aortic leaflets. Data obtained does not show significant increase in gradient. However, there is some aortic stenosis. - Left atrium: The atrium was moderately dilated. - Right ventricle: The cavity size was normal. Systolic function was normal. - Impressions: No cardiac source of embolism was identified, but cannot be ruled out on the basis of this examination. Impressions:  - No cardiac source of embolism was identified, but cannot be ruled out on the basis of this examination.    Carotids  Preliminary report: 1-39% right ICA stenosis. Left ICA occluded. Right vertebral artery flow antegrade. Left vertebral artery not insonate.     Consults  Neuro   Medications  Scheduled Meds: . aspirin  300 mg Rectal Daily   Or  . aspirin  325 mg Oral Daily  . darifenacin  7.5 mg Oral Daily  . enoxaparin (LOVENOX) injection  40 mg Subcutaneous Q24H  . ezetimibe  10 mg Oral Daily  . insulin aspart  0-5 Units Subcutaneous QHS  . insulin aspart  0-9 Units Subcutaneous TID WC  .  insulin glargine  8 Units Subcutaneous Q breakfast  . levETIRAcetam  500 mg Oral BID  . simvastatin  40 mg Oral QHS  . tamsulosin  0.4 mg Oral Daily   Continuous Infusions: . sodium chloride 75 mL/hr at 12/17/13 0337  . sodium chloride 20 mL/hr at 12/20/13 1054   PRN Meds:.acetaminophen, acetaminophen, haloperidol lactate, senna-docusate  DVT Prophylaxis  Lovenox    Lab Results  Component Value Date   PLT 208 12/20/2013    Antibiotics     Anti-infectives   None          Subjective:   [name] today has, No headache, No chest pain, No abdominal pain - No Nausea, No new weakness tingling or numbness, No Cough - SOB. Pleasantly  confused but answers all questions. He is slightly more drowsy this morning as he got Ativan last night tends to get an MRI.  Objective:   Filed Vitals:   12/19/13 2254 12/20/13 0209 12/20/13 0627 12/20/13 0934  BP: 123/59 138/76 107/57 99/51  Pulse: 78 71 69 72  Temp: 98.8 F (37.1 C) 97.6 F (36.4 C) 97.8 F (36.6 C) 97.4 F (36.3 C)  TempSrc: Oral Oral Oral Oral  Resp: 18 16 16 16   SpO2: 98% 95% 95% 97%    Wt Readings from Last 3 Encounters:  03/18/13 102.1 kg (225 lb 1.4 oz)  03/18/13 102.1 kg (225 lb 1.4 oz)  03/18/13 102.1 kg (225 lb 1.4 oz)     Intake/Output Summary (Last 24 hours) at 12/20/13 1114 Last data filed at 12/20/13 0900  Gross per 24 hour  Intake    240 ml  Output    300 ml  Net    -60 ml     Physical Exam  Him and but easily arousable, remains confused, R. Arm 1/5 , Normal affect, can move his right leg stump [name].AT,PERRAL Supple Neck,No JVD, No cervical lymphadenopathy appriciated.  Symmetrical Chest wall movement, Good air movement bilaterally, CTAB RRR,No Gallops,Rubs or new Murmurs, No Parasternal Heave +ve B.Sounds, Abd Soft, Non tender, No organomegaly appriciated, No rebound - guarding or rigidity. Wearing a condom catheter. No Cyanosis, Clubbing or edema, No new Rash or bruise , Bilat BKA     Data Review   Micro Results No results found for this or any previous visit (from the past 240 hour(s)).  Radiology Reports Ct Head Wo Contrast  12/16/2013   CLINICAL DATA:  Dysarthria, right facial droop  EXAM: CT HEAD WITHOUT CONTRAST  TECHNIQUE: Contiguous axial images were obtained from the base of the skull through the vertex without intravenous contrast.  COMPARISON:  Prior head CT 04/15/2013  FINDINGS: Resolving heterogeneous extra-axial collection overlying the right frontal convexity consistent with a chronic subdural hematoma/ hygroma. The collection is smaller compared to 04/15/2013. New ill-defined hypoattenuation extending to the cortex  in the left frontal watershed region which is an interval finding compared to 04/15/2013. There appears to be some associated encephalomalacia and is favored to be subacute to remote. Similarly, there is a new focus of hypoattenuation in the right posterior watershed region which also has an appearance suggestive of a subacute to remote process. Additionally, there is global cerebral and cerebellar atrophy and sequelae of chronic microvascular ischemic white matter disease. No focal soft tissue or calvarial abnormality. Normal aeration of the mastoid air cells and paranasal sinuses. Burr hole defect in the right frontal calvarium.  IMPRESSION: 1. No definite acute intracranial abnormality. 2. Hypoattenuation of the left frontal and right posterior  watershed regions represents an interval change compared 04/15/2013 consistent with interval ischemic insults, however both regions have an appearance suggestive of subacute to chronic processes. 3. Slight interval improvement in chronic right frontal subdural hematoma/hygroma. 4. Atrophy and chronic microvascular ischemic white matter disease.   Electronically Signed   By: Jacqulynn Cadet M.D.   On: 12/16/2013 09:30   Dg Chest Port 1 View  12/16/2013   CLINICAL DATA:  Right-side weakness. Confusion. Diabetic hypertensive patient. History of testicular cancer.  EXAM: PORTABLE CHEST - 1 VIEW  COMPARISON:  03/04/2013.  FINDINGS: Heart size top-normal.  Mild central pulmonary vascular prominence.  Evaluation lung bases slightly limited.  No segmental consolidation.  No plain film evidence of pulmonary malignancy detected on this decreased inspiratory portable exam. Patient would eventually benefit from followup two view chest.  Acromioclavicular joint degenerative changes.  IMPRESSION: Mild central pulmonary vascular prominence and top-normal heart size. Please see above.   Electronically Signed   By: Chauncey Cruel M.D.   On: 12/16/2013 17:28    CBC  Recent Labs Lab  12/16/13 0848 12/20/13 0915  WBC 9.0 7.0  HGB 13.6 13.2  HCT 41.3 39.8  PLT 180 208  MCV 96.5 95.4  MCH 31.8 31.7  MCHC 32.9 33.2  RDW 14.1 14.2    Chemistries   Recent Labs Lab 12/16/13 0848 12/20/13 0915  NA 143 140  K 4.4 4.6  CL 109 107  CO2 19 20  GLUCOSE 157* 180*  BUN 28* 25*  CREATININE 1.10 1.08  CALCIUM 8.6 9.0  AST 11  --   ALT 8  --   ALKPHOS 113  --   BILITOT 0.5  --    ------------------------------------------------------------------------------------------------------------------ CrCl is unknown because both a height and weight (above a minimum accepted value) are required for this calculation. ------------------------------------------------------------------------------------------------------------------ No results found for this basename: HGBA1C,  in the last 72 hours ------------------------------------------------------------------------------------------------------------------ No results found for this basename: CHOL, HDL, LDLCALC, TRIG, CHOLHDL, LDLDIRECT,  in the last 72 hours ------------------------------------------------------------------------------------------------------------------ No results found for this basename: TSH, T4TOTAL, FREET3, T3FREE, THYROIDAB,  in the last 72 hours ------------------------------------------------------------------------------------------------------------------ No results found for this basename: VITAMINB12, FOLATE, FERRITIN, TIBC, IRON, RETICCTPCT,  in the last 72 hours  Coagulation profile No results found for this basename: INR, PROTIME,  in the last 168 hours  No results found for this basename: DDIMER,  in the last 72 hours  Cardiac Enzymes No results found for this basename: CK, CKMB, TROPONINI, MYOGLOBIN,  in the last 168 hours ------------------------------------------------------------------------------------------------------------------ No components found with this basename: POCBNP,       Time Spent in minutes   35   Thurnell Lose M.D on 12/20/2013 at 11:14 AM  Between 7am to 7pm - Pager - [phone]  After 7pm go to [email or web address] - password [email or web address]  And look for the night coverage person covering for me after hours  [facility] Office  [phone]

## 2013-12-20 NOTE — Progress Notes (Signed)
PT Cancellation Note  Patient Details Name: [name] MRN: [id number] DOB: [birth year]   Cancelled Treatment:    Reason Eval/Treat Not Completed: Fatigue/lethargy limiting ability to participate. Pt not appropriate for treatment at this time secondary to decreased arousal. Will re-attempt to see pt when it is appropriate.    [place], Mart[place]ll 12/20/2013, 2:16 PM

## 2013-12-20 NOTE — H&P (View-Only) (Signed)
                                            Patient Demographics  [name], is a 80 y.o. male, DOB - 04/25/1933, MRN:[id number]  Admit date - 12/16/2013   Admitting Physician [name] K Rai, MD  Outpatient Primary MD for the patient is [name], William, MD  LOS - 4   Chief Complaint  Patient presents with  . Altered Mental Status        Assessment & Plan    Acute CVA (cerebral infarction)/TIA acute encephalopathy - for 3 weeks  - Stable 2-D echo and carotid Dopplers . -Head CT is stable, however exam consistent with left MCA territory stroke. Continue aspirin along with statin for secondary prevention. MRI could not be done attempted x3 due to patient noncooperation. - Neuro following, TEE + Loop per Neuro     H/O SDH 1 yr ago -   on Keppra for preventing seizure, stable EEG this admission.     DIABETES MELLITUS   - Will place on sliding scale insulin once passed swallow testing  - Continue Lantus + ISS   Lab Results  Component Value Date   HGBA1C 7.4* 12/17/2013    CBG (last 3)   Recent Labs  12/19/13 1655 12/19/13 2128 12/20/13 0705  GLUCAP 195* 175* 164*       HYPERLIPIDEMIA  - Continue statins    Lab Results  Component Value Date   CHOL 92 12/17/2013   HDL 40 12/17/2013   LDLCALC 40 12/17/2013   TRIG 61 12/17/2013   CHOLHDL 2.3 12/17/2013      HYPERTENSION  - BP currently stable, borderline, hold antihypertensives , as needed IV Hydralazine    CAD  - No current cardiac symptoms     S/P bilateral BKA (below knee amputation)      Code Status: Full  Family Communication:  Wife 12-17-13 @ 12.50pm  Disposition Plan: TBD, may need placement   Procedures     MRI MRA brain, CT head, echo, carotid duplex, EEG, TEE, loop recorder  Echo  - Left ventricle: Technically limited study.  Hypokinesis of the inferior and inferolateral walls. The estimated ejection fraction was 50%. Doppler parameters are consistent with abnormal left ventricular relaxation (grade 1 diastolic dysfunction). - Aortic valve: Significant thickening with calcium of the aortic leaflets. Data obtained does not show significant increase in gradient. However, there is some aortic stenosis. - Left atrium: The atrium was moderately dilated. - Right ventricle: The cavity size was normal. Systolic function was normal. - Impressions: No cardiac source of embolism was identified, but cannot be ruled out on the basis of this examination. Impressions:  - No cardiac source of embolism was identified, but cannot be ruled out on the basis of this examination.    Carotids  Preliminary report: 1-39% right ICA stenosis. Left ICA occluded. Right vertebral artery flow antegrade. Left vertebral artery not insonate.     Consults  Neuro   Medications  Scheduled Meds: . aspirin  300 mg Rectal Daily   Or  . aspirin  325 mg Oral Daily  . darifenacin  7.5 mg Oral Daily  . enoxaparin (LOVENOX) injection  40 mg Subcutaneous Q24H  . ezetimibe  10 mg Oral Daily  . insulin aspart  0-5 Units Subcutaneous QHS  . insulin aspart  0-9 Units Subcutaneous TID WC  .   insulin glargine  8 Units Subcutaneous Q breakfast  . levETIRAcetam  500 mg Oral BID  . simvastatin  40 mg Oral QHS  . tamsulosin  0.4 mg Oral Daily   Continuous Infusions: . sodium chloride 75 mL/hr at 12/17/13 0337  . sodium chloride 20 mL/hr at 12/20/13 1054   PRN Meds:.acetaminophen, acetaminophen, haloperidol lactate, senna-docusate  DVT Prophylaxis  Lovenox    Lab Results  Component Value Date   PLT 208 12/20/2013    Antibiotics     Anti-infectives   None          Subjective:   [name] today has, No headache, No chest pain, No abdominal pain - No Nausea, No new weakness tingling or numbness, No Cough - SOB. Pleasantly  confused but answers all questions. He is slightly more drowsy this morning as he got Ativan last night tends to get an MRI.  Objective:   Filed Vitals:   12/19/13 2254 12/20/13 0209 12/20/13 0627 12/20/13 0934  BP: 123/59 138/76 107/57 99/51  Pulse: 78 71 69 72  Temp: 98.8 F (37.1 C) 97.6 F (36.4 C) 97.8 F (36.6 C) 97.4 F (36.3 C)  TempSrc: Oral Oral Oral Oral  Resp: 18 16 16 16   SpO2: 98% 95% 95% 97%    Wt Readings from Last 3 Encounters:  03/18/13 102.1 kg (225 lb 1.4 oz)  03/18/13 102.1 kg (225 lb 1.4 oz)  03/18/13 102.1 kg (225 lb 1.4 oz)     Intake/Output Summary (Last 24 hours) at 12/20/13 1114 Last data filed at 12/20/13 0900  Gross per 24 hour  Intake    240 ml  Output    300 ml  Net    -60 ml     Physical Exam  Him and but easily arousable, remains confused, R. Arm 1/5 , Normal affect, can move his right leg stump [name].AT,PERRAL Supple Neck,No JVD, No cervical lymphadenopathy appriciated.  Symmetrical Chest wall movement, Good air movement bilaterally, CTAB RRR,No Gallops,Rubs or new Murmurs, No Parasternal Heave +ve B.Sounds, Abd Soft, Non tender, No organomegaly appriciated, No rebound - guarding or rigidity. Wearing a condom catheter. No Cyanosis, Clubbing or edema, No new Rash or bruise , Bilat BKA     Data Review   Micro Results No results found for this or any previous visit (from the past 240 hour(s)).  Radiology Reports Ct Head Wo Contrast  12/16/2013   CLINICAL DATA:  Dysarthria, right facial droop  EXAM: CT HEAD WITHOUT CONTRAST  TECHNIQUE: Contiguous axial images were obtained from the base of the skull through the vertex without intravenous contrast.  COMPARISON:  Prior head CT 04/15/2013  FINDINGS: Resolving heterogeneous extra-axial collection overlying the right frontal convexity consistent with a chronic subdural hematoma/ hygroma. The collection is smaller compared to 04/15/2013. New ill-defined hypoattenuation extending to the cortex  in the left frontal watershed region which is an interval finding compared to 04/15/2013. There appears to be some associated encephalomalacia and is favored to be subacute to remote. Similarly, there is a new focus of hypoattenuation in the right posterior watershed region which also has an appearance suggestive of a subacute to remote process. Additionally, there is global cerebral and cerebellar atrophy and sequelae of chronic microvascular ischemic white matter disease. No focal soft tissue or calvarial abnormality. Normal aeration of the mastoid air cells and paranasal sinuses. Burr hole defect in the right frontal calvarium.  IMPRESSION: 1. No definite acute intracranial abnormality. 2. Hypoattenuation of the left frontal and right posterior  watershed regions represents an interval change compared 04/15/2013 consistent with interval ischemic insults, however both regions have an appearance suggestive of subacute to chronic processes. 3. Slight interval improvement in chronic right frontal subdural hematoma/hygroma. 4. Atrophy and chronic microvascular ischemic white matter disease.   Electronically Signed   By: Heath  McCullough M.D.   On: 12/16/2013 09:30   Dg Chest Port 1 View  12/16/2013   CLINICAL DATA:  Right-side weakness. Confusion. Diabetic hypertensive patient. History of testicular cancer.  EXAM: PORTABLE CHEST - 1 VIEW  COMPARISON:  03/04/2013.  FINDINGS: Heart size top-normal.  Mild central pulmonary vascular prominence.  Evaluation lung bases slightly limited.  No segmental consolidation.  No plain film evidence of pulmonary malignancy detected on this decreased inspiratory portable exam. Patient would eventually benefit from followup two view chest.  Acromioclavicular joint degenerative changes.  IMPRESSION: Mild central pulmonary vascular prominence and top-normal heart size. Please see above.   Electronically Signed   By: Steve  Olson M.D.   On: 12/16/2013 17:28    CBC  Recent Labs Lab  12/16/13 0848 12/20/13 0915  WBC 9.0 7.0  HGB 13.6 13.2  HCT 41.3 39.8  PLT 180 208  MCV 96.5 95.4  MCH 31.8 31.7  MCHC 32.9 33.2  RDW 14.1 14.2    Chemistries   Recent Labs Lab 12/16/13 0848 12/20/13 0915  NA 143 140  K 4.4 4.6  CL 109 107  CO2 19 20  GLUCOSE 157* 180*  BUN 28* 25*  CREATININE 1.10 1.08  CALCIUM 8.6 9.0  AST 11  --   ALT 8  --   ALKPHOS 113  --   BILITOT 0.5  --    ------------------------------------------------------------------------------------------------------------------ CrCl is unknown because both a height and weight (above a minimum accepted value) are required for this calculation. ------------------------------------------------------------------------------------------------------------------ No results found for this basename: HGBA1C,  in the last 72 hours ------------------------------------------------------------------------------------------------------------------ No results found for this basename: CHOL, HDL, LDLCALC, TRIG, CHOLHDL, LDLDIRECT,  in the last 72 hours ------------------------------------------------------------------------------------------------------------------ No results found for this basename: TSH, T4TOTAL, FREET3, T3FREE, THYROIDAB,  in the last 72 hours ------------------------------------------------------------------------------------------------------------------ No results found for this basename: VITAMINB12, FOLATE, FERRITIN, TIBC, IRON, RETICCTPCT,  in the last 72 hours  Coagulation profile No results found for this basename: INR, PROTIME,  in the last 168 hours  No results found for this basename: DDIMER,  in the last 72 hours  Cardiac Enzymes No results found for this basename: CK, CKMB, TROPONINI, MYOGLOBIN,  in the last 168 hours ------------------------------------------------------------------------------------------------------------------ No components found with this basename: POCBNP,       Time Spent in minutes   35   Adalai Perl K Tuvia Woodrick M.D on 12/20/2013 at 11:14 AM  Between 7am to 7pm - Pager - [phone]  After 7pm go to [email or web address] - password [email or web address]  And look for the night coverage person covering for me after hours  [facility] Office  [phone]  

## 2013-12-20 NOTE — Progress Notes (Signed)
SLP Cancellation Note  Patient Details Name: [name] MRN: [id number] DOB: [birth year]   Cancelled treatment:       Reason Eval/Treat Not Completed: Fatigue/lethargy limiting ability to participate. Pt sleeping upon SLP arrival, and would awaken briefly to auditory and tactile stimuli before falling back asleep. Per chart review, CXR 5/15 is not indicative of PNA. Will return as schedule allows for further attempts at cognitive-linguistic treatment.    Germain Osgood, M.A. CCC-SLP [phone]  Germain Osgood 12/20/2013, 10:04 AM

## 2013-12-20 NOTE — Progress Notes (Signed)
Patient taken to ENDO for TEE. Assessments remained unchanged prior to .

## 2013-12-20 NOTE — CV Procedure (Signed)
See full TEE report in [employer]; global hypokinesis with inferior and lateral akinesis; EF 30; severe LAE with mild spontaneous contrast; atrial septal aneurysm; negative saline microcavitation study. [name]

## 2013-12-20 NOTE — Progress Notes (Signed)
Subjective: patient is very drowsy after receiving TEE  Objective: Current vital signs: BP 98/46  Pulse 65  Temp(Src) 97.6 F (36.4 C) (Oral)  Resp 22  SpO2 95% Vital signs in last 24 hours: Temp:  [97.4 F (36.3 C)-99 F (37.2 C)] 97.6 F (36.4 C) (05/15 1251) Pulse Rate:  [64-84] 65 (05/15 1320) Resp:  [0-29] 22 (05/15 1320) BP: (77-163)/(43-76) 98/46 mmHg (05/15 1320) SpO2:  [93 %-100 %] 95 % (05/15 1320)  Intake/Output from previous day: 05/14 0701 - 05/15 0700 In: 210 [P.O.:210] Out: 300 [Urine:300] Intake/Output this shift: Total I/O In: 120 [P.O.:120] Out: -  Nutritional status: NPO  Neurologic Exam: Mental Status: Drowsy following no commands. Localizes to pain with left arm and raises left leg spontaneously. . Cranial Nerves: II:  Eyes held shut.  III,IV, VI: doll's intact V,VII: face symmetric,  Motor: Moving left arm and leg antigravity and localizing to pain with left arm. No movement of right arm. Minimal movement of right leg. Sensory: Pinprick and light touch intact throughout, bilaterally Deep Tendon Reflexes:  Right: Upper Extremity   Left: Upper extremity   biceps (C-5 to C-6) 2/4   biceps (C-5 to C-6) 2/4 tricep (C7) 2/4    triceps (C7) 2/4 Brachioradialis (C6) 2/4  Brachioradialis (C6) 2/4  Lower Extremity Lower Extremity  quadriceps (L-2 to L-4) 1/4   quadriceps (L-2 to L-4) 1/4     t    Lab Results: Basic Metabolic Panel:  Recent Labs Lab 12/16/13 0848 12/20/13 0915  NA 143 140  K 4.4 4.6  CL 109 107  CO2 19 20  GLUCOSE 157* 180*  BUN 28* 25*  CREATININE 1.10 1.08  CALCIUM 8.6 9.0    Liver Function Tests:  Recent Labs Lab 12/16/13 0848  AST 11  ALT 8  ALKPHOS 113  BILITOT 0.5  PROT 6.9  ALBUMIN 3.0*   No results found for this basename: LIPASE, AMYLASE,  in the last 168 hours  Recent Labs Lab 12/20/13 1100  AMMONIA 38    CBC:  Recent Labs Lab 12/16/13 0848 12/20/13 0915  WBC 9.0 7.0  HGB 13.6 13.2   HCT 41.3 39.8  MCV 96.5 95.4  PLT 180 208    Cardiac Enzymes: No results found for this basename: CKTOTAL, CKMB, CKMBINDEX, TROPONINI,  in the last 168 hours  Lipid Panel:  Recent Labs Lab 12/17/13 0600  CHOL 92  TRIG 61  HDL 40  CHOLHDL 2.3  VLDL 12  LDLCALC 40    CBG:  Recent Labs Lab 12/19/13 1143 12/19/13 1655 12/19/13 2128 12/20/13 0705 12/20/13 1126  GLUCAP 146* 195* 175* 164* 177*    Microbiology: Results for orders placed during the hospital encounter of 03/04/13  MRSA PCR SCREENING     Status: None   Collection Time    03/04/13  9:01 PM      Result Value Ref Range Status   MRSA by PCR NEGATIVE  NEGATIVE Final   Comment:            The GeneXpert MRSA Assay (FDA     approved for NASAL specimens     only), is one component of a     comprehensive MRSA colonization     surveillance program. It is not     intended to diagnose MRSA     infection nor to guide or     monitor treatment for     MRSA infections.  CULTURE, BLOOD (SINGLE)     Status: None  Collection Time    03/10/13  4:53 PM      Result Value Ref Range Status   Specimen Description BLOOD LEFT ARM   Final   Special Requests BOTTLES DRAWN AEROBIC ONLY 2CC   Final   Culture  Setup Time     Final   Value: 03/10/2013 21:45     Performed at [facility]   Culture     Final   Value: NO GROWTH 5 DAYS     Performed at [facility]   Report Status 03/16/2013 FINAL   Final  WOUND CULTURE     Status: None   Collection Time    03/10/13  6:38 PM      Result Value Ref Range Status   Specimen Description WOUND   Final   Special Requests RIGHT MIDDLE FINGER UNDER NAIL   Final   Gram Stain     Final   Value: RARE WBC PRESENT, PREDOMINANTLY PMN     NO SQUAMOUS EPITHELIAL CELLS SEEN     NO ORGANISMS SEEN     Performed at [facility]   Culture     Final   Value: FEW PROTEUS MIRABILIS     Performed at [facility]   Report Status 03/13/2013 FINAL   Final    Organism ID, Bacteria PROTEUS MIRABILIS   Final  ANAEROBIC CULTURE     Status: None   Collection Time    03/10/13  6:38 PM      Result Value Ref Range Status   Specimen Description WOUND   Final   Special Requests RIGHT MIDDLE FINGER UNDER NAIL   Final   Gram Stain     Final   Value: RARE WBC PRESENT, PREDOMINANTLY PMN     NO SQUAMOUS EPITHELIAL CELLS SEEN     NO ORGANISMS SEEN     Performed at [facility]   Culture     Final   Value: NO ANAEROBES ISOLATED     Performed at [facility]   Report Status 03/16/2013 FINAL   Final  TISSUE CULTURE     Status: None   Collection Time    03/10/13  6:40 PM      Result Value Ref Range Status   Specimen Description TISSUE   Final   Special Requests RIGHT MIDDLE FINGER UNDER NAIL   Final   Gram Stain     Final   Value: NO WBC SEEN     NO ORGANISMS SEEN     Performed at [facility]   Culture     Final   Value: RARE PROTEUS MIRABILIS     Performed at [facility]   Report Status 03/13/2013 FINAL   Final   Organism ID, Bacteria PROTEUS MIRABILIS   Final    Coagulation Studies: No results found for this basename: LABPROT, INR,  in the last 72 hours  Imaging: Dg Chest 1 View  12/20/2013   CLINICAL DATA:  Cough, hypertension  EXAM: CHEST - 1 VIEW  COMPARISON:  12/16/2013  FINDINGS: Persistent low lung volumes with streaky bibasilar densities compatible with atelectasis. Stable prominent hilar vascularity. No effusion or pneumothorax. Trachea is midline. Degenerative changes noted of the spine.  IMPRESSION: Stable low lung volumes and basilar atelectasis.   Electronically Signed   By: Daryll Brod M.D.   On: 12/20/2013 08:39   Ct Head Wo Contrast  12/20/2013   CLINICAL DATA:  Worsening mental status following CVA  EXAM:  CT HEAD WITHOUT CONTRAST  TECHNIQUE: Contiguous axial images were obtained from the base of the skull through the vertex without intravenous contrast.  COMPARISON:  CT HEAD W/O CM dated  12/16/2013; CT HEAD W/O CM dated 04/15/2013  FINDINGS: Similar findings advanced atrophy and sulcal prominence. Stable sequela of right frontal calvarial burr hole with associated prominence of the extra-axial space about the right frontal lobe and chronic adjacent subdural thickening. No definite acute intraparenchymal or extra-axial hemorrhage.  Asymmetric ill-defined areas of hypoattenuating attenuation within the left frontal and parietal lobe appear grossly unchanged. Ill-defined geographic area of hypoattenuation within the right parietal-occipital lobe (image 20) is also unchanged. Given extensive back parenchymal abnormalities, there is no CT evidence of acute large territory infarct. No intraparenchymal or extra-axial mass or hemorrhage. Unchanged size and configuration of the ventricles and basilar cisterns. No midline shift. Intracranial atherosclerosis. Regional soft tissues appear normal. Post bilateral cataract surgery. No displaced calvarial fracture. Limited visualization of the paranasal sinuses and mastoid air cells appear normal.  IMPRESSION: 1. No acute intracranial process. 2. Similar findings of chronic/subacute watershed infarcts within the right parietal occipital and left frontal parietal lobes without interval change. 3. Stable sequela of prior right frontal lobe subdural hematoma. 4. Stable findings of atrophy and microvascular ischemic disease.   Electronically Signed   By: Sandi Mariscal M.D.   On: 12/20/2013 08:31    Medications:  Scheduled: . aspirin  300 mg Rectal Daily   Or  . aspirin  325 mg Oral Daily  . darifenacin  7.5 mg Oral Daily  . enoxaparin (LOVENOX) injection  40 mg Subcutaneous Q24H  . ezetimibe  10 mg Oral Daily  . insulin aspart  0-5 Units Subcutaneous QHS  . insulin aspart  0-9 Units Subcutaneous TID WC  . insulin glargine  8 Units Subcutaneous Q breakfast  . levETIRAcetam  500 mg Oral BID  . simvastatin  40 mg Oral QHS  . tamsulosin  0.4 mg Oral Daily    ECHO: - Left ventricle: Technically limited study. Hypokinesis of the inferior and inferolateral walls. The estimated ejection fraction was 50%. Doppler parameters are consistent with abnormal left ventricular relaxation (grade 1 diastolic dysfunction). - Aortic valve: Significant thickening with calcium of the aortic leaflets. Data obtained does not show significant increase in gradient. However, there is some aortic stenosis. - Left atrium: The atrium was moderately dilated. - Right ventricle: The cavity size was normal. Systolic function was normal. - Impressions: No cardiac source of embolism was identified, but cannot be ruled out on the basis of this examination. Impressions:  - No cardiac source of embolism was identified, but cannot be ruled out on the basis of this examination  TEE: global hypokinesis with inferior and lateral akinesis; EF 30; severe LAE with mild spontaneous contrast; atrial septal aneurysm; negative saline microcavitation study.  LDL 40  A1C 7.2  Assessment/Plan:  80 yo M with 1 month history of confusion and right sided weakness. I suspect that he has a mild aphasia and weakness due to his left infarct. The posterior contralateral infarct likely would have worsened confusion.  MRI could not be done attempted x3 due to patient non cooperation. TEE now shows EF 30% with global hypokinesis and inferior and lateral akinesis.  TTE notes 50% EF which contradict TEE 30% EF. Have spoken with Dr. Candiss Norse who will repeat 2 D echo to confirm EF.  IF EF truly 30% may be a candidate for anticoagulation in setting of multiple embolic infarcts. OF  note: wife states he tends to fall out of bed 1-2X per week. Will continue to follow.    Etta Quill PA-C [facility] [phone]  12/20/2013, 1:30 PM

## 2013-12-20 NOTE — Progress Notes (Signed)
Echocardiogram 2D Echocardiogram limited has been performed.  [name] 12/20/2013, 4:52 PM

## 2013-12-20 NOTE — Progress Notes (Addendum)
   CSW updated [name] with [facility] concerning Pt's possible d/c to facility later today pending test results.   CSW will continue to follow Pt for d/c planning.    [facility]  [address];  [phone] Phone: [phone]

## 2013-12-20 NOTE — Progress Notes (Signed)
Echocardiogram Echocardiogram Transesophageal has been performed.  [name] [name] 12/20/2013, 12:59 PM

## 2013-12-21 ENCOUNTER — Inpatient Hospital Stay (HOSPITAL_COMMUNITY): Payer: Medicare Other

## 2013-12-21 DIAGNOSIS — W19XXXA Unspecified fall, initial encounter: Secondary | ICD-10-CM

## 2013-12-21 LAB — GLUCOSE, CAPILLARY
GLUCOSE-CAPILLARY: 175 mg/dL — AB (ref 70–99)
Glucose-Capillary: 87 mg/dL (ref 70–99)
Glucose-Capillary: 162 mg/dL — ABNORMAL HIGH (ref 70–99)
Glucose-Capillary: 170 mg/dL — ABNORMAL HIGH (ref 70–99)

## 2013-12-21 LAB — TROPONIN I: Troponin I: <0.3 ng/mL (ref <0.30)

## 2013-12-21 MED ORDER — IOHEXOL 350 MG/ML SOLN
50.0000 mL | Freq: Once | INTRAVENOUS | Status: AC | PRN
  Administered 2013-12-21: 50 mL via INTRAVENOUS

## 2013-12-21 MED ORDER — APIXABAN 5 MG PO TABS
5.0000 mg | ORAL_TABLET | Freq: Two times a day (BID) | ORAL | Status: DC
  Administered 2013-12-21 – 2013-12-23 (×5): 5 mg via ORAL
  Filled 2013-12-21 (×6): qty 1

## 2013-12-21 MED ORDER — CARVEDILOL 3.125 MG PO TABS
3.1250 mg | ORAL_TABLET | Freq: Two times a day (BID) | ORAL | Status: DC
  Administered 2013-12-21 – 2013-12-25 (×7): 3.125 mg via ORAL
  Filled 2013-12-21 (×10): qty 1

## 2013-12-21 MED ORDER — SODIUM CHLORIDE 0.9 % IV SOLN
INTRAVENOUS | Status: AC
  Administered 2013-12-22: via INTRAVENOUS

## 2013-12-21 MED ORDER — LISINOPRIL 2.5 MG PO TABS
2.5000 mg | ORAL_TABLET | Freq: Every day | ORAL | Status: DC
  Administered 2013-12-21 – 2013-12-25 (×5): 2.5 mg via ORAL
  Filled 2013-12-21 (×6): qty 1

## 2013-12-21 NOTE — Progress Notes (Signed)
ANTICOAGULATION CONSULT NOTE - Initial Consult  Pharmacy Consult for Apixaban Indication: stroke  Allergies  Allergen Reactions  . Ativan [Lorazepam] Other (See Comments)    Very lethargic  . Benadryl [Diphenhydramine Hcl] Other (See Comments)    Very lethargic    Patient Measurements: Height:  (BKA) Total body Weight:  102kg  Vital Signs: Temp: 98.1 F (36.7 C) (05/16 0900) Temp src: Oral (05/16 0900) BP: 165/77 mmHg (05/16 0900) Pulse Rate: 86 (05/16 0900)  Labs:  Recent Labs  12/20/13 0915 12/20/13 1702 12/21/13 0440  HGB 13.2  --   --   HCT 39.8  --   --   PLT 208  --   --   CREATININE 1.08  --   --   TROPONINI  --  <0.30 <0.30    The CrCl is unknown because both a height and weight (above a minimum accepted value) are required for this calculation.   Medical History: Past Medical History  Diagnosis Date  . Diabetes mellitus   . Hypertension   . Testicular cancer dx'd 08/2010    surg only  . CHF (congestive heart failure)   . CAD (coronary artery disease)   . Peripheral vascular disease   . Vitamin B12 deficiency   . Diabetic retinopathy   . Diabetic peripheral neuropathy   . Cholelithiasis   . Family history of colon cancer     brother   . 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 Osteomyelitis of finger of right hand 03/2013    R middle finger  . Pressure ulcer of buttock 03/2013    stage 2     Assessment: 80yom admitted with new onset slurred speech.   CT of the brain  IMPRESSION:  1. No acute intracranial process.  2. Similar findings of chronic/subacute watershed infarcts within  the right parietal occipital and left frontal parietal lobes without  interval change.  CBC stable, Cr 1.1, Wt > 45kg, age < 60yo   Goal of Therapy:   Monitor platelets by anticoagulation protocol: Yes   Plan:   Apixaban 5mg  po BID   Bonnita Nasuti Pharm.D. CPP, BCPS Clinical Pharmacist [phone] 12/21/2013 12:38 PM

## 2013-12-21 NOTE — Progress Notes (Signed)
Patient refused all 1000 medications. Medications were in applesauce and patient wouldn't allow me to feed them to him.Explained why he needed the medications and patient still refused. Once given the spoon for him to feed himself, patient held the spoon for 2-3 minutes and would not take the medications. Repeated " I am not taking medications" repeatedly until I left the room. Burnell Blanks, RN

## 2013-12-21 NOTE — Progress Notes (Signed)
Patient Demographics  [name], is a 80 y.o. male, DOB - [birth year], [id number]  Admit date - 12/16/2013   Admitting Physician Ripudeep Krystal Eaton, MD  Outpatient Primary MD for the patient is Marton Redwood, MD  LOS - 5   Chief Complaint  Patient presents with  . Altered Mental Status        Assessment & Plan    Acute CVA (cerebral infarction)/TIA acute encephalopathy - for 3 weeks with encephalopathy  - Stable 2-D echo and carotid Dopplers . TTE noted with smoke phenomenon and depressed EF. Head CT head shows multiple CVAs which appear subacute, he is unable to cooperate MRI which was attempted 3 times. Switch from aspirin to Eliquis along with statin for secondary prevention.  Neuro following. Noted TEE Placed on Eliquis D/W Neuro and Cards. Since we cannot obtain MRI neurology recommended CT angiogram of head and neck which will be ordered. Requested loop recorder placement.      Chr Systolic CHF EF [id number] on TEE dropped from 50% on TTE - also smoke phenomenon noted on TEE, discussed with neurologist Dr. Leonel Ramsay and cardiologist Dr. Debara Pickett, have consulted him, at this time best served with being on Eliquis. He remains high fall risk but risk versus benefit fever Eliquis. Appears compensated, will add low-dose ACE inhibitor along with Coreg especially since he is 3 weeks out of stroke.      H/O SDH 1 yr ago -   on Keppra for preventing seizure, stable EEG this admission. Discussed with son and wife patient needs to be careful from fall standpoint in the future. Hopefully will go to SNF now and will be in a monitored setting. They both understand the risks and benefits of being vs not being on anticoagulation.     CAD   No acute issues.     DIABETES MELLITUS   - Will place  on sliding scale insulin once passed swallow testing  - Continue Lantus + ISS   Lab Results  Component Value Date   HGBA1C 7.4* 12/17/2013    CBG (last 3)   Recent Labs  12/20/13 1658 12/20/13 2148 12/21/13 0637  GLUCAP 146* 197* 87       HYPERLIPIDEMIA  - Continue statins    Lab Results  Component Value Date   CHOL 92 12/17/2013   HDL 40 12/17/2013   LDLCALC 40 12/17/2013   TRIG 61 12/17/2013   CHOLHDL 2.3 12/17/2013      HYPERTENSION  - BP currently stable, now 3 weeks out of acute event, we'll add low-dose Coreg and ACE inhibitor along with as needed hydralazine     S/P bilateral BKA (below knee amputation)      Code Status: Full  Family Communication:  Wife 12-17-13 @ 12.50pm, 12/18/2013, bedside wife on 12/21/2013 and son over the phone on 12/21/2013  Disposition Plan:  SNF    Procedures     MRI MRA brain, CT head, echo, carotid duplex, EEG, TEE, loop recorder possibly on Monday, CT angiogram of head and neck. Possible loop recorder.     Echo  - Left ventricle: Technically limited study. Hypokinesis of the inferior and inferolateral walls. The estimated ejection fraction was 50%. Doppler parameters are consistent with abnormal left ventricular relaxation (  grade 1 diastolic dysfunction). - Aortic valve: Significant thickening with calcium of the aortic leaflets. Data obtained does not show significant increase in gradient. However, there is some aortic stenosis. - Left atrium: The atrium was moderately dilated. - Right ventricle: The cavity size was normal. Systolic function was normal. - Impressions: No cardiac source of embolism was identified, but cannot be ruled out on the basis of this examination.   Impressions:  - No cardiac source of embolism was identified, but cannot be ruled out on the basis of this examination.    Carotids  Preliminary report: 1-39% right ICA stenosis. Left ICA occluded. Right vertebral artery flow antegrade.  Left vertebral artery not insonate.     Consults  Neuro, cardiology   Medications  Scheduled Meds: . aspirin  300 mg Rectal Daily   Or  . aspirin  325 mg Oral Daily  . darifenacin  7.5 mg Oral Daily  . ezetimibe  10 mg Oral Daily  . insulin aspart  0-5 Units Subcutaneous QHS  . insulin aspart  0-9 Units Subcutaneous TID WC  . insulin glargine  8 Units Subcutaneous Q breakfast  . levETIRAcetam  500 mg Oral BID  . simvastatin  40 mg Oral QHS  . tamsulosin  0.4 mg Oral Daily   Continuous Infusions: . sodium chloride     PRN Meds:.acetaminophen, acetaminophen, haloperidol lactate, senna-docusate  DVT Prophylaxis  Lovenox    Lab Results  Component Value Date   PLT 208 12/20/2013    Antibiotics     Anti-infectives   None          Subjective:   [name] today has, No headache, No chest pain, No abdominal pain - No Nausea, No new weakness tingling or numbness, No Cough - SOB. Pleasantly confused but answers all questions. More awake alert this morning talking to his wife.  Objective:   Filed Vitals:   12/20/13 2130 12/21/13 0111 12/21/13 0548 12/21/13 0900  BP: 121/49 113/49 142/57 165/77  Pulse: 66 65 65 86  Temp: 97.7 F (36.5 C) 98.4 F (36.9 C) 97.9 F (36.6 C) 98.1 F (36.7 C)  TempSrc: Oral Oral Oral Oral  Resp: 16 18 18 18   SpO2: 100% 100% 97% 95%    Wt Readings from Last 3 Encounters:  03/18/13 102.1 kg (225 lb 1.4 oz)  03/18/13 102.1 kg (225 lb 1.4 oz)  03/18/13 102.1 kg (225 lb 1.4 oz)     Intake/Output Summary (Last 24 hours) at 12/21/13 1033 Last data filed at 12/20/13 1900  Gross per 24 hour  Intake     80 ml  Output      0 ml  Net     80 ml     Physical Exam  In bed, awake, answering questions appropriately today, remains confused, R. Arm 1/5 , Normal affect, can move his right leg stump [place].AT,PERRAL Supple Neck,No JVD, No cervical lymphadenopathy appriciated.  Symmetrical Chest wall movement, Good air movement  bilaterally, CTAB RRR,No Gallops,Rubs or new Murmurs, No Parasternal Heave +ve B.Sounds, Abd Soft, Non tender, No organomegaly appriciated, No rebound - guarding or rigidity. Wearing a condom catheter. No Cyanosis, Clubbing or edema, No new Rash or bruise , Bilat BKA     Data Review   Micro Results No results found for this or any previous visit (from the past 240 hour(s)).  Radiology Reports Ct Head Wo Contrast  12/16/2013   CLINICAL DATA:  Dysarthria, right facial droop  EXAM: CT HEAD WITHOUT CONTRAST  TECHNIQUE: Contiguous axial images were obtained from the base of the skull through the vertex without intravenous contrast.  COMPARISON:  Prior head CT 04/15/2013  FINDINGS: Resolving heterogeneous extra-axial collection overlying the right frontal convexity consistent with a chronic subdural hematoma/ hygroma. The collection is smaller compared to 04/15/2013. New ill-defined hypoattenuation extending to the cortex in the left frontal watershed region which is an interval finding compared to 04/15/2013. There appears to be some associated encephalomalacia and is favored to be subacute to remote. Similarly, there is a new focus of hypoattenuation in the right posterior watershed region which also has an appearance suggestive of a subacute to remote process. Additionally, there is global cerebral and cerebellar atrophy and sequelae of chronic microvascular ischemic white matter disease. No focal soft tissue or calvarial abnormality. Normal aeration of the mastoid air cells and paranasal sinuses. Burr hole defect in the right frontal calvarium.  IMPRESSION: 1. No definite acute intracranial abnormality. 2. Hypoattenuation of the left frontal and right posterior watershed regions represents an interval change compared 04/15/2013 consistent with interval ischemic insults, however both regions have an appearance suggestive of subacute to chronic processes. 3. Slight interval improvement in chronic right  frontal subdural hematoma/hygroma. 4. Atrophy and chronic microvascular ischemic white matter disease.   Electronically Signed   By: Jacqulynn Cadet M.D.   On: 12/16/2013 09:30   Dg Chest Port 1 View  12/16/2013   CLINICAL DATA:  Right-side weakness. Confusion. Diabetic hypertensive patient. History of testicular cancer.  EXAM: PORTABLE CHEST - 1 VIEW  COMPARISON:  03/04/2013.  FINDINGS: Heart size top-normal.  Mild central pulmonary vascular prominence.  Evaluation lung bases slightly limited.  No segmental consolidation.  No plain film evidence of pulmonary malignancy detected on this decreased inspiratory portable exam. Patient would eventually benefit from followup two view chest.  Acromioclavicular joint degenerative changes.  IMPRESSION: Mild central pulmonary vascular prominence and top-normal heart size. Please see above.   Electronically Signed   By: Chauncey Cruel M.D.   On: 12/16/2013 17:28    CBC  Recent Labs Lab 12/16/13 0848 12/20/13 0915  WBC 9.0 7.0  HGB 13.6 13.2  HCT 41.3 39.8  PLT 180 208  MCV 96.5 95.4  MCH 31.8 31.7  MCHC 32.9 33.2  RDW 14.1 14.2    Chemistries   Recent Labs Lab 12/16/13 0848 12/20/13 0915  NA 143 140  K 4.4 4.6  CL 109 107  CO2 19 20  GLUCOSE 157* 180*  BUN 28* 25*  CREATININE 1.10 1.08  CALCIUM 8.6 9.0  AST 11  --   ALT 8  --   ALKPHOS 113  --   BILITOT 0.5  --    ------------------------------------------------------------------------------------------------------------------ CrCl is unknown because both a height and weight (above a minimum accepted value) are required for this calculation. ------------------------------------------------------------------------------------------------------------------ No results found for this basename: HGBA1C,  in the last 72 hours ------------------------------------------------------------------------------------------------------------------ No results found for this basename: CHOL, HDL,  LDLCALC, TRIG, CHOLHDL, LDLDIRECT,  in the last 72 hours ------------------------------------------------------------------------------------------------------------------ No results found for this basename: TSH, T4TOTAL, FREET3, T3FREE, THYROIDAB,  in the last 72 hours ------------------------------------------------------------------------------------------------------------------ No results found for this basename: VITAMINB12, FOLATE, FERRITIN, TIBC, IRON, RETICCTPCT,  in the last 72 hours  Coagulation profile No results found for this basename: INR, PROTIME,  in the last 168 hours  No results found for this basename: DDIMER,  in the last 72 hours  Cardiac Enzymes  Recent Labs Lab 12/20/13 1702 12/21/13 0440  TROPONINI <0.30 <0.30   ------------------------------------------------------------------------------------------------------------------  No components found with this basename: POCBNP,      Time Spent in minutes   35   Thurnell Lose M.D on 12/21/2013 at 10:33 AM  Between 7am to 7pm - Pager - [phone]  After 7pm go to [email or web address] - password [email or web address]  And look for the night coverage person covering for me after hours  [facility] Office  [phone]

## 2013-12-21 NOTE — Progress Notes (Signed)
Stroke Team Progress Note  HISTORY [name] is an 80 y.o. male with history of right SDH S/P bur hole for evacuation for a large chronic subdural hematoma with marked mass effect. Patient also has history of seizures which he currently is on Keppra 500 mg BID. Per wife HE is wheel chair bound, has recently shown increased memory decline, able to feed himself but otherwise needs assistance with all ADL's. HE has a Home health CNA which cares for him.  Over the past month she has noted he does not use his right arm and allows it to hang over his wheel chair. In addition she has noted over the last few days he has had less vision in the right field. Yesterday at 1600 he was noted to have increased slurred speech but this improved over the night. This AM he was found on the floor next to his bed and confused by granddaughter. It is not clear how he arrived on the floor. He has no bruises or hematomas. He complains of no pain.  Per wife he continues to sound mor slurred than usual.   Patient was not a TPA candidate secondary to duration of deficit.   SUBJECTIVE His wife is at bedside. Patient is aphasic. Wife indicates this began on day of admit.  OBJECTIVE Most recent Vital Signs: Filed Vitals:   12/20/13 2130 12/21/13 0111 12/21/13 0548 12/21/13 0900  BP: 121/49 113/49 142/57 165/77  Pulse: 66 65 65 86  Temp: 97.7 F (36.5 C) 98.4 F (36.9 C) 97.9 F (36.6 C) 98.1 F (36.7 C)  TempSrc: Oral Oral Oral Oral  Resp: 16 18 18 18   SpO2: 100% 100% 97% 95%   CBG (last 3)   Recent Labs  12/20/13 1658 12/20/13 2148 12/21/13 0637  GLUCAP 146* 197* 87    IV Fluid Intake:   . sodium chloride      MEDICATIONS  . carvedilol  3.125 mg Oral BID WC  . darifenacin  7.5 mg Oral Daily  . ezetimibe  10 mg Oral Daily  . insulin aspart  0-5 Units Subcutaneous QHS  . insulin aspart  0-9 Units Subcutaneous TID WC  . insulin glargine  8 Units Subcutaneous Q breakfast  . levETIRAcetam  500 mg  Oral BID  . lisinopril  2.5 mg Oral Daily  . simvastatin  40 mg Oral QHS  . tamsulosin  0.4 mg Oral Daily   PRN:  acetaminophen, acetaminophen, haloperidol lactate, senna-docusate  Diet:  Carb Control thin liquids Activity:  Up with assistance DVT Prophylaxis:  Apixaban  CLINICALLY SIGNIFICANT STUDIES Basic Metabolic Panel:  Recent Labs Lab 12/16/13 0848 12/20/13 0915  NA 143 140  K 4.4 4.6  CL 109 107  CO2 19 20  GLUCOSE 157* 180*  BUN 28* 25*  CREATININE 1.10 1.08  CALCIUM 8.6 9.0   Liver Function Tests:  Recent Labs Lab 12/16/13 0848  AST 11  ALT 8  ALKPHOS 113  BILITOT 0.5  PROT 6.9  ALBUMIN 3.0*   CBC:  Recent Labs Lab 12/16/13 0848 12/20/13 0915  WBC 9.0 7.0  HGB 13.6 13.2  HCT 41.3 39.8  MCV 96.5 95.4  PLT 180 208   Coagulation: No results found for this basename: LABPROT, INR,  in the last 168 hours Cardiac Enzymes:  Recent Labs Lab 12/20/13 1702 12/21/13 0440  TROPONINI <0.30 <0.30   Urinalysis:  Recent Labs Lab 12/16/13 1054  COLORURINE YELLOW  LABSPEC 1.022  PHURINE 5.5  GLUCOSEU NEGATIVE  HGBUR LARGE*  BILIRUBINUR NEGATIVE  KETONESUR NEGATIVE  PROTEINUR NEGATIVE  UROBILINOGEN 1.0  NITRITE NEGATIVE  LEUKOCYTESUR NEGATIVE   Lipid Panel    Component Value Date/Time   CHOL 92 12/17/2013 0600   TRIG 61 12/17/2013 0600   HDL 40 12/17/2013 0600   CHOLHDL 2.3 12/17/2013 0600   VLDL 12 12/17/2013 0600   LDLCALC 40 12/17/2013 0600   HgbA1C  Lab Results  Component Value Date   HGBA1C 7.4* 12/17/2013    Urine Drug Screen:   No results found for this basename: labopia, cocainscrnur, labbenz, amphetmu, thcu, labbarb    Alcohol Level: No results found for this basename: ETH,  in the last 168 hours  Dg Chest 1 View  12/20/2013   CLINICAL DATA:  Cough, hypertension  EXAM: CHEST - 1 VIEW  COMPARISON:  12/16/2013  FINDINGS: Persistent low lung volumes with streaky bibasilar densities compatible with atelectasis. Stable prominent hilar  vascularity. No effusion or pneumothorax. Trachea is midline. Degenerative changes noted of the spine.  IMPRESSION: Stable low lung volumes and basilar atelectasis.   Electronically Signed   By: Daryll Brod M.D.   On: 12/20/2013 08:39   Ct Head Wo Contrast  12/20/2013   CLINICAL DATA:  Worsening mental status following CVA  EXAM: CT HEAD WITHOUT CONTRAST  TECHNIQUE: Contiguous axial images were obtained from the base of the skull through the vertex without intravenous contrast.  COMPARISON:  CT HEAD W/O CM dated 12/16/2013; CT HEAD W/O CM dated 04/15/2013  FINDINGS: Similar findings advanced atrophy and sulcal prominence. Stable sequela of right frontal calvarial burr hole with associated prominence of the extra-axial space about the right frontal lobe and chronic adjacent subdural thickening. No definite acute intraparenchymal or extra-axial hemorrhage.  Asymmetric ill-defined areas of hypoattenuating attenuation within the left frontal and parietal lobe appear grossly unchanged. Ill-defined geographic area of hypoattenuation within the right parietal-occipital lobe (image 20) is also unchanged. Given extensive back parenchymal abnormalities, there is no CT evidence of acute large territory infarct. No intraparenchymal or extra-axial mass or hemorrhage. Unchanged size and configuration of the ventricles and basilar cisterns. No midline shift. Intracranial atherosclerosis. Regional soft tissues appear normal. Post bilateral cataract surgery. No displaced calvarial fracture. Limited visualization of the paranasal sinuses and mastoid air cells appear normal.  IMPRESSION: 1. No acute intracranial process. 2. Similar findings of chronic/subacute watershed infarcts within the right parietal occipital and left frontal parietal lobes without interval change. 3. Stable sequela of prior right frontal lobe subdural hematoma. 4. Stable findings of atrophy and microvascular ischemic disease.   Electronically Signed   By: Sandi Mariscal M.D.   On: 12/20/2013 08:31    CT of the brain   IMPRESSION:  1. No acute intracranial process.  2. Similar findings of chronic/subacute watershed infarcts within  the right parietal occipital and left frontal parietal lobes without  interval change.  3. Stable sequela of prior right frontal lobe subdural hematoma.  4. Stable findings of atrophy and microvascular ischemic disease.   MRI of the brain  Patient unable to tolerate.  MRA of the brain    2D Echocardiogram   Study Conclusions  - Left ventricle: Global hypokinesis; akineticinferior lateral wall Systolic function was severely reduced. The estimated ejection fraction was in the range of 25% to 30%. - Aortic valve: Trivial regurgitation. - Mitral valve: No evidence of vegetation. - Left atrium: The atrium was moderately to severely dilated. No evidence of thrombus in the atrial cavity or appendage. There was mild spontaneous echo  contrast ("smoke") in the cavity. - Atrial septum: Atrial septal aneurysm No defect or patent foramen ovale was identified. - Tricuspid valve: No evidence of vegetation. - Pulmonic valve: No evidence of vegetation. Impressions:  - Negative saline microcavitation study.   Carotid Doppler   Carotid duplex completed.  Preliminary report: 1-39% right ICA stenosis. Left ICA occluded. Right vertebral artery flow antegrade. Left vertebral artery not insonate.    CXR   IMPRESSION:  Stable low lung volumes and basilar atelectasis.   EKG    Therapy Recommendations Pending  Physical Exam  General: The patient is alert and cooperative at the time of the examination.  Skin: Bilateral BKA   Neurologic Exam  Mental status: The patient is alert, aphasic.  Cranial nerves: Facial symmetry is present. Speech is aphasic, he will repeat and name objects, he will not follow verbal commands. Extraocular movements are full. Visual fields are notable for a right HH.  Motor: The patient has  good strength in the left upper extremity. Right arm is almost plegic, bilateral BKA.  Sensory examination: Patient seems to respond to pain sensation on all 4 extremities.  Coordination: The patient has inability to follow commands for cerebellar testing.  Gait and station: The gait could not be tested.  Reflexes: Deep tendon reflexes are symmetric, decreased.    ASSESSMENT Mr. [name] is a 80 y.o. male presenting with confusion and aphasia. No TPA due to duration of deficit. Subacute bihemispheric CVA noted. Likely embolic, main deficit from left MCA distribution CVA.   Bihemispheric CVA  Low EF by echo, atrial smoke  DM  HTN  CAD  PVD  Diabetic PN  Hospital day # 5  TREATMENT/PLAN  Apixaban for anticoagulation  PT and OT to see  CTA of the head and neck  Cardiology to see  Stroke team to follow   12/21/2013 10:56 AM  Kathrynn Ducking

## 2013-12-21 NOTE — Consult Note (Signed)
Consulting cardiologist: Dr Arnoldo Lenis  Clinical Summary Mr. Closson is a 80 y.o.male hx of DM, HTN, CAD, PAD, CHF admitted with slurred speech and right arm weakness. Per notes patient was found on the floor responsive but confused with slurred speech. He had right arm weakness for the days leading up to this episode. CT head showed no acute process, but did show hypoattenuation in the left frontal and right posterior watershed regions consistent with prior subacute ischemic insult. This has been followed and managed by internal medicine and neurology.  EKG NSR with low voltage, 12/18/13 TTE with normal LVEF. 12/20/13 TEE reports decreased LVEF 25-30% with moderate to severe LAE, with mild spontaneous echo contrast ("smoke"). The discrepancy in the studies as far as LVEF is unclear.     Allergies  Allergen Reactions  . Ativan [Lorazepam] Other (See Comments)    Very lethargic  . Benadryl [Diphenhydramine Hcl] Other (See Comments)    Very lethargic    Medications Scheduled Medications: . apixaban  5 mg Oral BID  . carvedilol  3.125 mg Oral BID WC  . darifenacin  7.5 mg Oral Daily  . ezetimibe  10 mg Oral Daily  . insulin aspart  0-5 Units Subcutaneous QHS  . insulin aspart  0-9 Units Subcutaneous TID WC  . insulin glargine  8 Units Subcutaneous Q breakfast  . levETIRAcetam  500 mg Oral BID  . lisinopril  2.5 mg Oral Daily  . simvastatin  40 mg Oral QHS  . tamsulosin  0.4 mg Oral Daily     Infusions: . sodium chloride 50 mL/hr at 12/21/13 1045     PRN Medications:  acetaminophen, acetaminophen, haloperidol lactate, senna-docusate   Past Medical History  Diagnosis Date  . Diabetes mellitus   . Hypertension   . Testicular cancer dx'd 08/2010    surg only  . CHF (congestive heart failure)   . CAD (coronary artery disease)   . Peripheral vascular disease   . Vitamin B12 deficiency   . Diabetic retinopathy   . Diabetic peripheral neuropathy   .  Cholelithiasis   . Family history of colon cancer     brother   . 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 Osteomyelitis of finger of right hand 03/2013    R middle finger  . Pressure ulcer of buttock 03/2013    stage 2    Past Surgical History  Procedure Laterality Date  . Below knee leg amputation  2005    Right  . Below knee leg amputation  2006    Left  . Appendectomy    . Eye surgery  2012    Laser  . Ptca    . Trudee Kuster hole Right 03/04/2013    Procedure: Haskell Flirt;  Surgeon: Charlie Pitter, MD;  Location: [facility];  Service: Neurosurgery;  Laterality: Right;  Haskell Flirt     Family History  Problem Relation Age of Onset  . Colon cancer Brother     Social History Mr. Hackman reports that he has quit smoking. He has never used smokeless tobacco. Mr. Humber reports that he does not drink alcohol.  Review of Systems CONSTITUTIONAL: No weight loss, fever, chills, weakness or fatigue.  HEENT: Eyes: No visual loss, blurred vision, double vision or yellow sclerae. No hearing loss, sneezing, congestion, runny nose or sore throat.  SKIN: No rash or itching.  CARDIOVASCULAR: No chest pain, chest pressure or chest discomfort. No palpitations or edema.  RESPIRATORY: No shortness of breath,  cough or sputum.  GASTROINTESTINAL: No anorexia, nausea, vomiting or diarrhea. No abdominal pain or blood.  GENITOURINARY: no polyuria, no dysuria NEUROLOGICAL: No headache, dizziness, syncope, paralysis, ataxia, numbness or tingling in the extremities. No change in bowel or bladder control.  MUSCULOSKELETAL: No muscle, back pain, joint pain or stiffness.  HEMATOLOGIC: No anemia, bleeding or bruising.  LYMPHATICS: No enlarged nodes. No history of splenectomy.  PSYCHIATRIC: No history of depression or anxiety.      Physical Examination Blood pressure 165/77, pulse 86, temperature 98.1 F (36.7 C), temperature source Oral, resp. rate 18, height 0' (0 m), SpO2 95.00%.  Intake/Output Summary (Last 24 hours) at  12/21/13 1354 Last data filed at 12/20/13 1900  Gross per 24 hour  Intake     80 ml  Output      0 ml  Net     80 ml    Cardiovascular: RRR, no m/r/g  Respiratory: CTAB  GI: abdomen soft, NT, ND  MSK: no LE edema  Psych: appropriate   Lab Results  Basic Metabolic Panel:  Recent Labs Lab 12/16/13 0848 12/20/13 0915  NA 143 140  K 4.4 4.6  CL 109 107  CO2 19 20  GLUCOSE 157* 180*  BUN 28* 25*  CREATININE 1.10 1.08  CALCIUM 8.6 9.0    Liver Function Tests:  Recent Labs Lab 12/16/13 0848  AST 11  ALT 8  ALKPHOS 113  BILITOT 0.5  PROT 6.9  ALBUMIN 3.0*    CBC:  Recent Labs Lab 12/16/13 0848 12/20/13 0915  WBC 9.0 7.0  HGB 13.6 13.2  HCT 41.3 39.8  MCV 96.5 95.4  PLT 180 208    Cardiac Enzymes:  Recent Labs Lab 12/20/13 1702 12/21/13 0440  TROPONINI <0.30 <0.30    BNP: No components found with this basename: POCBNP,    ECG   Imaging 12/18/2013 Transthoracic Echo Study Conclusions  - Left ventricle: Technically limited study. Hypokinesis of the inferior and inferolateral walls. The estimated ejection fraction was 50%. Doppler parameters are consistent with abnormal left ventricular relaxation (grade 1 diastolic dysfunction). - Aortic valve: Significant thickening with calcium of the aortic leaflets. Data obtained does not show significant increase in gradient. However, there is some aortic stenosis. - Left atrium: The atrium was moderately dilated. - Right ventricle: The cavity size was normal. Systolic function was normal. - Impressions: No cardiac source of embolism was identified, but cannot be ruled out on the basis of this examination. Impressions:  - No cardiac source of embolism was identified, but cannot be ruled out on the basis of this examination.   12/20/13 TEE Study Conclusions  - Left ventricle: Global hypokinesis; akineticinferior lateral wall Systolic function was severely reduced. The estimated  ejection fraction was in the range of 25% to 30%. - Aortic valve: Trivial regurgitation. - Mitral valve: No evidence of vegetation. - Left atrium: The atrium was moderately to severely dilated. No evidence of thrombus in the atrial cavity or appendage. There was mild spontaneous echo contrast ("smoke") in the cavity. - Atrial septum: Atrial septal aneurysm No defect or patent foramen ovale was identified. - Tricuspid valve: No evidence of vegetation. - Pulmonic valve: No evidence of vegetation. Impressions:  - Negative saline microcavitation study.   Impression/Recommendations 1. Low LVEF - the discrepancy between LV function reported in the TTE and TEE is unclear at this time, the echo reading stations are down at the time of this consult and I am not able to review. There is a  repeat echo that has been ordered as well that is pending - will review studies when available, would not make drastic therapeutic changes or commit to extensive cardiac testing until this questions is resolved.    2. CVA - per neurology and primary team, in setting of LA "smoke" and multiterritory infarct agree with anticoagulation - though would not alter anticoagulation, would recommend outpatient cardiac monitor to look for afib or potentially arrhythmia that may have been associated with his fall in the first place that may require alternative therapy.  Carlyle Dolly, M.D., F.A.C.C.

## 2013-12-21 NOTE — Progress Notes (Signed)
Patient wouldn't let me scan his arm band this morning to give him his medication. Attempted to explain the importance of his Lantus but he angrily refused the medication. Burnell Blanks, RN

## 2013-12-22 DIAGNOSIS — I63239 Cerebral infarction due to unspecified occlusion or stenosis of unspecified carotid arteries: Secondary | ICD-10-CM

## 2013-12-22 DIAGNOSIS — I509 Heart failure, unspecified: Secondary | ICD-10-CM

## 2013-12-22 DIAGNOSIS — E1149 Type 2 diabetes mellitus with other diabetic neurological complication: Secondary | ICD-10-CM

## 2013-12-22 LAB — GLUCOSE, CAPILLARY
GLUCOSE-CAPILLARY: 204 mg/dL — AB (ref 70–99)
Glucose-Capillary: 163 mg/dL — ABNORMAL HIGH (ref 70–99)
Glucose-Capillary: 199 mg/dL — ABNORMAL HIGH (ref 70–99)
Glucose-Capillary: 172 mg/dL — ABNORMAL HIGH (ref 70–99)

## 2013-12-22 NOTE — Consult Note (Signed)
Echo reading stations remain down, unable at this time to review echo results, though should be up later today. His true cardiac function remains unclear at this time. Tele without evidence of afib/aflutter. Given LA smoke and multiterritory infarct agree with anticoagulation. Once echoes available for review will resolve questions about potential underlying cardiac dysfuncion.    Carlyle Dolly MD

## 2013-12-22 NOTE — Progress Notes (Addendum)
PATIENT DETAILS Name: [name] Age: 80 y.o. Sex: male Date of Birth: 02-16-1933 Admit Date: 12/16/2013 Admitting Physician Ripudeep Krystal Eaton, MD [id number], Gwyndolyn Saxon, MD  Subjective: No major complaints   Assessment/Plan: Principal Problem:  Subacute CVA (cerebral infarction) - Patient was admitted on 12/16/13 after he was found by his granddaughter with garbled speech and significant upper extremity weakness. Per family, they have been noticing that the patient's right upper extremity has been weak for a few weeks prior to this admission. Initial CT of the head on 5/11 showed infarct in the left frontal and right posterior watershed lesions.MRI could not be done attempted x3 due to patient non cooperation. On admission neurology was consulted, patient underwent further workup which included a carotid Doppler which showed 1-39% right ICA stenosis, left ICA was occluded. He also underwent a transthoracic echocardiogram on 5/13, which showed an EF around 50%. Given the fact that patient had multiple areas of infarction, likely cardioembolic, he underwent transesophageal echocardiogram on 5/15 which showed global hypokinesis with an EF around 25-30%, it also showed smoked in the left atrial cavity. Neurology subsequently recommended patient be started on Eliquis. He is to have significant weakness in his right upper extremity. Telemetry shows normal sinus rhythm with no evidence of A. Fib/flutter. Please see below for further details regarding cardiomyopathy - Further workup revealed LDL 40, and hemoglobin A1c 7.4. - Current plans are to discharge to skilled nursing facility once workup is complete. -Please note-Dr Candiss Norse has discussed with son and wife patient needs to be careful from fall standpoint in the future. Hopefully will go to SNF now and will be in a monitored setting. They both understand the risks and benefits of being vs not being on anticoagulation.  Suspected chronic systolic  heart failure - Since patient had a CVA, patient underwent transthoracic echocardiogram which showed preserved ejection fraction, however since the suspicion for cardioembolic CVA was high, patient then underwent a transesophageal echocardiogram which showed a significantly low EF around 25-30%. Since there is a big discrepancy between the 2 studies, cardiology has been consulted, patient remains on Eliquis given smoke seen on the left atrium on TEE. Further workup will depend on cardiology recommendations. However very of prior charts in Epic does document known chronic systolic heart failure with an EF around 20-25%. - Clinically compensated, being managed with low-dose lisinopril and Coreg.  H/O SDH-July 2014 - Stable-difficult situation in his head significant falls in the past with subdural hematoma requiring evacuation in 2014. See anti-coagulation discussion above.  Hx of Seizures -c/w Keppra-stable  Diabetes - CBGs stable, continue with Lantus and SSI. -A1c 7.4.  History of CAD - No acute issues, not on antiplatelet agents-as on Eliquis. Continue statins and beta blockers.  HYPERLIPIDEMIA  - Continue statins and Zetia - LDL 40  HYPERTENSION  - Moderate controlled with lisinopril and Coreg  S/P bilateral BKA (below knee amputation)  Disposition: Remain inpatient-SNF on discharge  DVT Prophylaxis: Not needed as on Eliquis  Code Status: Full code   Family Communication None at bedside  Procedures:  TEE-5/15  CONSULTS:  cardiology and neurology  Time spent 40 minutes-which includes 50% of the time with face-to-face with patient/ family and coordinating care related to the above assessment and plan.  MEDICATIONS: Scheduled Meds: . apixaban  5 mg Oral BID  . carvedilol  3.125 mg Oral BID WC  . darifenacin  7.5 mg Oral Daily  . ezetimibe  10 mg Oral Daily  .  insulin aspart  0-5 Units Subcutaneous QHS  . insulin aspart  0-9 Units Subcutaneous TID WC  . insulin  glargine  8 Units Subcutaneous Q breakfast  . levETIRAcetam  500 mg Oral BID  . lisinopril  2.5 mg Oral Daily  . simvastatin  40 mg Oral QHS  . tamsulosin  0.4 mg Oral Daily   Continuous Infusions: . sodium chloride 50 mL/hr at 12/22/13 0006   PRN Meds:.acetaminophen, acetaminophen, haloperidol lactate, senna-docusate  Antibiotics: Anti-infectives   None       PHYSICAL EXAM: Vital signs in last 24 hours: Filed Vitals:   12/21/13 2126 12/22/13 0134 12/22/13 0511 12/22/13 0939  BP: 141/60 112/49 155/74   Pulse: 81 70 72 77  Temp: 98.1 F (36.7 C) 97.4 F (36.3 C) 98 F (36.7 C) 98.4 F (36.9 C)  TempSrc: Oral Oral Oral Oral  Resp: 22 20 20 19   SpO2: 97% 99% 100% 96%    Weight change:  There were no vitals filed for this visit. There is no weight on file to calculate BMI.   Gen Exam: Awake and alert  Neck: Supple, No JVD.   Chest: B/L Clear. Abdomen: soft, BS +, non tender, non distended.  Extremities: b/l Bka Neurologic: Still with significant right upper ext weakness Skin: No Rash.   Wounds: N/A.  Intake/Output from previous day:  Intake/Output Summary (Last 24 hours) at 12/22/13 1030 Last data filed at 12/22/13 0900  Gross per 24 hour  Intake      0 ml  Output   2750 ml  Net  -2750 ml     LAB RESULTS: CBC  Recent Labs Lab 12/16/13 0848 12/20/13 0915  WBC 9.0 7.0  HGB 13.6 13.2  HCT 41.3 39.8  PLT 180 208  MCV 96.5 95.4  MCH 31.8 31.7  MCHC 32.9 33.2  RDW 14.1 14.2    Chemistries   Recent Labs Lab 12/16/13 0848 12/20/13 0915  NA 143 140  K 4.4 4.6  CL 109 107  CO2 19 20  GLUCOSE 157* 180*  BUN 28* 25*  CREATININE 1.10 1.08  CALCIUM 8.6 9.0    CBG:  Recent Labs Lab 12/21/13 0637 12/21/13 1110 12/21/13 1639 12/21/13 2121 12/22/13 0648  GLUCAP 87 162* 170* 175* 163*    GFR The CrCl is unknown because both a height and weight (above a minimum accepted value) are required for this calculation.  Coagulation profile No  results found for this basename: INR, PROTIME,  in the last 168 hours  Cardiac Enzymes  Recent Labs Lab 12/20/13 1702 12/21/13 0440 12/21/13 1550  TROPONINI <0.30 <0.30 <0.30    No components found with this basename: POCBNP,  No results found for this basename: DDIMER,  in the last 72 hours No results found for this basename: HGBA1C,  in the last 72 hours No results found for this basename: CHOL, HDL, LDLCALC, TRIG, CHOLHDL, LDLDIRECT,  in the last 72 hours No results found for this basename: TSH, T4TOTAL, FREET3, T3FREE, THYROIDAB,  in the last 72 hours No results found for this basename: VITAMINB12, FOLATE, FERRITIN, TIBC, IRON, RETICCTPCT,  in the last 72 hours No results found for this basename: LIPASE, AMYLASE,  in the last 72 hours  Urine Studies No results found for this basename: UACOL, UAPR, USPG, UPH, UTP, UGL, UKET, UBIL, UHGB, UNIT, UROB, ULEU, UEPI, UWBC, URBC, UBAC, CAST, CRYS, UCOM, BILUA,  in the last 72 hours  MICROBIOLOGY: No results found for this or any previous visit (from  the past 240 hour(s)).  RADIOLOGY STUDIES/RESULTS: Dg Chest 1 View  12/20/2013   CLINICAL DATA:  Cough, hypertension  EXAM: CHEST - 1 VIEW  COMPARISON:  12/16/2013  FINDINGS: Persistent low lung volumes with streaky bibasilar densities compatible with atelectasis. Stable prominent hilar vascularity. No effusion or pneumothorax. Trachea is midline. Degenerative changes noted of the spine.  IMPRESSION: Stable low lung volumes and basilar atelectasis.   Electronically Signed   By: Daryll Brod M.D.   On: 12/20/2013 08:39   Ct Head Wo Contrast  12/20/2013   CLINICAL DATA:  Worsening mental status following CVA  EXAM: CT HEAD WITHOUT CONTRAST  TECHNIQUE: Contiguous axial images were obtained from the base of the skull through the vertex without intravenous contrast.  COMPARISON:  CT HEAD W/O CM dated 12/16/2013; CT HEAD W/O CM dated 04/15/2013  FINDINGS: Similar findings advanced atrophy and sulcal  prominence. Stable sequela of right frontal calvarial burr hole with associated prominence of the extra-axial space about the right frontal lobe and chronic adjacent subdural thickening. No definite acute intraparenchymal or extra-axial hemorrhage.  Asymmetric ill-defined areas of hypoattenuating attenuation within the left frontal and parietal lobe appear grossly unchanged. Ill-defined geographic area of hypoattenuation within the right parietal-occipital lobe (image 20) is also unchanged. Given extensive back parenchymal abnormalities, there is no CT evidence of acute large territory infarct. No intraparenchymal or extra-axial mass or hemorrhage. Unchanged size and configuration of the ventricles and basilar cisterns. No midline shift. Intracranial atherosclerosis. Regional soft tissues appear normal. Post bilateral cataract surgery. No displaced calvarial fracture. Limited visualization of the paranasal sinuses and mastoid air cells appear normal.  IMPRESSION: 1. No acute intracranial process. 2. Similar findings of chronic/subacute watershed infarcts within the right parietal occipital and left frontal parietal lobes without interval change. 3. Stable sequela of prior right frontal lobe subdural hematoma. 4. Stable findings of atrophy and microvascular ischemic disease.   Electronically Signed   By: Sandi Mariscal M.D.   On: 12/20/2013 08:31   Ct Head Wo Contrast  12/16/2013   CLINICAL DATA:  Dysarthria, right facial droop  EXAM: CT HEAD WITHOUT CONTRAST  TECHNIQUE: Contiguous axial images were obtained from the base of the skull through the vertex without intravenous contrast.  COMPARISON:  Prior head CT 04/15/2013  FINDINGS: Resolving heterogeneous extra-axial collection overlying the right frontal convexity consistent with a chronic subdural hematoma/ hygroma. The collection is smaller compared to 04/15/2013. New ill-defined hypoattenuation extending to the cortex in the left frontal watershed region which is  an interval finding compared to 04/15/2013. There appears to be some associated encephalomalacia and is favored to be subacute to remote. Similarly, there is a new focus of hypoattenuation in the right posterior watershed region which also has an appearance suggestive of a subacute to remote process. Additionally, there is global cerebral and cerebellar atrophy and sequelae of chronic microvascular ischemic white matter disease. No focal soft tissue or calvarial abnormality. Normal aeration of the mastoid air cells and paranasal sinuses. Burr hole defect in the right frontal calvarium.  IMPRESSION: 1. No definite acute intracranial abnormality. 2. Hypoattenuation of the left frontal and right posterior watershed regions represents an interval change compared 04/15/2013 consistent with interval ischemic insults, however both regions have an appearance suggestive of subacute to chronic processes. 3. Slight interval improvement in chronic right frontal subdural hematoma/hygroma. 4. Atrophy and chronic microvascular ischemic white matter disease.   Electronically Signed   By: Jacqulynn Cadet M.D.   On: 12/16/2013 09:30   Ct  Angio Neck W/cm &/or Wo/cm  12/21/2013   CLINICAL DATA:  Stroke. Confusion and aphasia. Right-sided weakness.  EXAM: CT ANGIOGRAPHY HEAD AND NECK  TECHNIQUE: Multidetector CT imaging of the head and neck was performed using the standard protocol during bolus administration of intravenous contrast. Multiplanar CT image reconstructions and MIPs were obtained to evaluate the vascular anatomy. Carotid stenosis measurements (when applicable) are obtained utilizing NASCET criteria, using the distal internal carotid diameter as the denominator.  CONTRAST:  46mL OMNIPAQUE IOHEXOL 350 MG/ML SOLN  COMPARISON:  None.  CT head 12/20/2013  FINDINGS: CTA HEAD FINDINGS  Ill-defined hypodensity in the left frontal parietal lobe suspicious for subacute infarct. There are scattered small areas of lower density in  the left frontal parietal lobe over the convexity consistent with infarction. These were not present on 04/15/2013 but were present on the admitting CT of 12/16/2013. This infarct is difficult to date with certainty but I suspected is subacute. Generalized atrophy. Chronic infarct in the left occipital lobe. Prior burr hole drainage of a subdural hematoma on the right. Mild low-density extra-axial fluid collection on the right is unchanged. This shows some dural enhancement indicative of chronic subdural hematoma and prior surgery. No acute hemorrhage identified. Postcontrast imaging reveals no enhancing mass lesion.  Left internal carotid artery is occluded in the neck and reconstitutes in the supra clinoid portion. The cavernous carotid is heavily calcified bilaterally with moderately severe stenosis on the right and occlusion on the left. Right middle cerebral artery is patent with a mild to moderate stenosis in the M1 segment. Both anterior cerebral arteries are patent with mild disease. The left MCA territory is hypoperfused as it is supplied by collateral vessels.  Atherosclerotic calcification in the distal vertebral artery bilaterally. Both vertebral arteries contribute to the basilar. The basilar shows diffuse mild disease. Moderate atherosclerotic disease in the posterior cerebral arteries bilaterally.  Negative for cerebral aneurysm.  Review of the MIP images confirms the above findings.  CTA NECK FINDINGS  Proximal great vessels are patent. No mass or adenopathy in the neck.  A right carotid: Right common carotid artery widely patent. Mild atherosclerotic disease the carotid bifurcation without significant stenosis. Right internal carotid artery is patent to the skullbase.  Left carotid: Left common carotid artery widely patent. There is atherosclerotic disease of the carotid bifurcation with occlusion of the left internal carotid artery just past the origin. This vessel is occluded through the cavernous  segment.  Vertebral arteries: Scattered atherosclerotic calcification is present in the vertebral arteries bilaterally including the origin the right vertebral artery in the distal vertebral arteries bilaterally. Both vertebral arteries are patent to the basilar.  Review of the MIP images confirms the above findings.  IMPRESSION: Left MCA infarct, suspect this is subacute in duration.  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 There is reconstitution of the left supra clinoid internal carotid artery by collateral vessels. Left MCA territory is patent but hypoperfused.  There is moderate to severe diffuse intracranial atherosclerotic disease.   Electronically Signed   By: Franchot Gallo M.D.   On: 12/21/2013 14:02   Dg Chest Port 1 View  12/16/2013   CLINICAL DATA:  Right-side weakness. Confusion. Diabetic hypertensive patient. History of testicular cancer.  EXAM: PORTABLE CHEST - 1 VIEW  COMPARISON:  03/04/2013.  FINDINGS: Heart size top-normal.  Mild central pulmonary vascular prominence.  Evaluation lung bases slightly limited.  No segmental consolidation.  No plain film evidence of pulmonary malignancy detected on this  decreased inspiratory portable exam. Patient would eventually benefit from followup two view chest.  Acromioclavicular joint degenerative changes.  IMPRESSION: Mild central pulmonary vascular prominence and top-normal heart size. Please see above.   Electronically Signed   By: Chauncey Cruel M.D.   On: 12/16/2013 17:28    Gordana Kewley Kristeen Mans, MD  [facility] Pager:[phone]  If 7PM-7AM, please contact night-coverage [email or web address] Password [email or web address] 12/22/2013, 10:30 AM   LOS: 6 days   **Disclaimer: This note may have been dictated with voice recognition software. Similar sounding words can inadvertently be transcribed and this note may contain transcription errors which may not have been corrected upon publication of note.**

## 2013-12-22 NOTE — Progress Notes (Signed)
Stroke Team Progress Note  HISTORY [name] is a 80 y.o. male with history of right SDH S/P bur hole for evacuation for a large chronic subdural hematoma with marked mass effect. Patient also has history of seizures which he currently is on Keppra 500 mg BID. Per wife he is wheel chair bound, has recently shown increased memory decline, able to feed himself but otherwise needs assistance with all ADL's. He has a home health CNA which cares for him.  Over the past month she has noted he does not use his right arm and allows it to hang over his wheel chair. In addition she has noted over the last few days he has had less vision in the right field. On 12/15/2013 at 1600 he was noted to have increased slurred speech but this improved over the night. On the morning of admission, 12/16/2013, he was found on the floor next to his bed and confused by granddaughter. It is not clear how he arrived on the floor. He has no bruises or hematomas. He complains of no pain.  Per wife he continues to sound more slurred than usual.   Patient was not a TPA candidate secondary to duration of deficit.   SUBJECTIVE There are no family members present. The patient is aphasic but able to follow some simple commands.  OBJECTIVE Most recent Vital Signs: Filed Vitals:   12/21/13 1500 12/21/13 2126 12/22/13 0134 12/22/13 0511  BP: 114/56 141/60 112/49 155/74  Pulse: 72 81 70 72  Temp: 98.5 F (36.9 C) 98.1 F (36.7 C) 97.4 F (36.3 C) 98 F (36.7 C)  TempSrc: Oral Oral Oral Oral  Resp: 17 22 20 20   SpO2: 98% 97% 99% 100%   CBG (last 3)   Recent Labs  12/21/13 1639 12/21/13 2121 12/22/13 0648  GLUCAP 170* 175* 163*    IV Fluid Intake:   . sodium chloride 50 mL/hr at 12/22/13 0006    MEDICATIONS  . apixaban  5 mg Oral BID  . carvedilol  3.125 mg Oral BID WC  . darifenacin  7.5 mg Oral Daily  . ezetimibe  10 mg Oral Daily  . insulin aspart  0-5 Units Subcutaneous QHS  . insulin aspart  0-9 Units  Subcutaneous TID WC  . insulin glargine  8 Units Subcutaneous Q breakfast  . levETIRAcetam  500 mg Oral BID  . lisinopril  2.5 mg Oral Daily  . simvastatin  40 mg Oral QHS  . tamsulosin  0.4 mg Oral Daily   PRN:  acetaminophen, acetaminophen, haloperidol lactate, senna-docusate  Diet:  Carb Control thin liquids Activity:  Up with assistance DVT Prophylaxis:  Apixaban  CLINICALLY SIGNIFICANT STUDIES Basic Metabolic Panel:   Recent Labs Lab 12/16/13 0848 12/20/13 0915  NA 143 140  K 4.4 4.6  CL 109 107  CO2 19 20  GLUCOSE 157* 180*  BUN 28* 25*  CREATININE 1.10 1.08  CALCIUM 8.6 9.0   Liver Function Tests:   Recent Labs Lab 12/16/13 0848  AST 11  ALT 8  ALKPHOS 113  BILITOT 0.5  PROT 6.9  ALBUMIN 3.0*   CBC:   Recent Labs Lab 12/16/13 0848 12/20/13 0915  WBC 9.0 7.0  HGB 13.6 13.2  HCT 41.3 39.8  MCV 96.5 95.4  PLT 180 208   Coagulation: No results found for this basename: LABPROT, INR,  in the last 168 hours Cardiac Enzymes:   Recent Labs Lab 12/20/13 1702 12/21/13 0440 12/21/13 1550  TROPONINI <0.30 <0.30 <0.30  Urinalysis:   Recent Labs Lab 12/16/13 1054  COLORURINE YELLOW  LABSPEC 1.022  PHURINE 5.5  GLUCOSEU NEGATIVE  HGBUR LARGE*  BILIRUBINUR NEGATIVE  KETONESUR NEGATIVE  PROTEINUR NEGATIVE  UROBILINOGEN 1.0  NITRITE NEGATIVE  LEUKOCYTESUR NEGATIVE   Lipid Panel    Component Value Date/Time   CHOL 92 12/17/2013 0600   TRIG 61 12/17/2013 0600   HDL 40 12/17/2013 0600   CHOLHDL 2.3 12/17/2013 0600   VLDL 12 12/17/2013 0600   LDLCALC 40 12/17/2013 0600   HgbA1C  Lab Results  Component Value Date   HGBA1C 7.4* 12/17/2013    Urine Drug Screen:   No results found for this basename: labopia,  cocainscrnur,  labbenz,  amphetmu,  thcu,  labbarb    Alcohol Level: No results found for this basename: ETH,  in the last 168 hours  Dg Chest 1 View 12/20/2013    Stable low lung volumes and basilar atelectasis.   Ct Head Wo  Contrast 12/20/2013    1. No acute intracranial process.  2. Similar findings of chronic/subacute watershed infarcts within the right parietal occipital and left frontal parietal lobes without interval change.  3. Stable sequela of prior right frontal lobe subdural hematoma.  4. Stable findings of atrophy and microvascular ischemic disease.     Ct Angio Neck W/cm &/or Wo/cm 12/21/2013    Left MCA infarct, suspect this is subacute in duration.  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 There is reconstitution of the left supra clinoid internal carotid artery by collateral vessels. Left MCA territory is patent but hypoperfused.  There is moderate to severe diffuse intracranial atherosclerotic disease.     MRI of the brain  Patient unable to tolerate.  MRA of the brain    2D Echocardiogram  ejection fraction 50%. No cardiac source of emboli identified.  TEE - Left ventricle: Global hypokinesis; akinetic inferior lateral wall Systolic function was severely reduced.  The estimated ejection fraction was in the range of 25% to 30%. - Aortic valve: Trivial regurgitation. - Mitral valve: No evidence of vegetation. - Left atrium: The atrium was moderately to severely dilated.     No evidence of thrombus in the atrial cavity or appendage.  There was mild spontaneous echo contrast ("smoke") in the cavity. - Atrial septum: Atrial septal aneurysm  No defect or patent foramen ovale was identified. - Tricuspid valve: No evidence of vegetation. - Pulmonic valve: No evidence of vegetation. Impressions: - Negative saline microcavitation study.   Carotid Doppler   Carotid duplex completed.  Preliminary report: 1-39% right ICA stenosis. Left ICA occluded. Right vertebral artery flow antegrade. Left vertebral artery not insonate.    CXR   IMPRESSION:  Stable low lung volumes and basilar atelectasis.   EKG    Therapy Recommendations Pending  Physical Exam  General: The  patient is alert and cooperative at the time of the examination. Skin: Bilateral BKA  Neurologic Exam  Mental status: The patient is alert, aphasic. Cranial nerves: Facial symmetry is present. Speech is aphasic, he will repeat and name objects, he will not follow verbal commands. Extraocular movements are full. Visual fields are notable for a right HH. Motor: The patient has good strength in the left upper extremity. Right arm is almost plegic, bilateral BKA. Sensory examination: Patient seems to respond to pain sensation on all 4 extremities. Coordination: The patient has inability to follow commands for cerebellar testing. Gait and station: The gait could not be tested. Reflexes: Deep tendon  reflexes are symmetric, decreased.    ASSESSMENT Mr. [name] is a 80 y.o. male presenting with confusion and aphasia. No TPA due to duration of deficit. Subacute bihemispheric CVA noted. Likely embolic, main deficit from left MCA distribution CVA.   Bihemispheric CVA  Low EF 25-30% by TEE atrial smoke (TTE - EF 50%)  DM  HTN  CAD  PVD - bilateral BKAs  Diabetic PN  Occlusion of left internal carotid artery  Seizure disorder [facility] day # 6  TREATMENT/PLAN  Apixaban for anticoagulation  PT and OT to see  CTA of the head and neck as above  Cardiology is following, agree with reccomedation for prolonged cardiac monitoring  Stroke team to follow   Lowry Ram [facility] Pager [phone] 12/22/2013, 8:05 AM   Kathrynn Ducking

## 2013-12-23 ENCOUNTER — Encounter (HOSPITAL_COMMUNITY): Payer: Self-pay | Admitting: Cardiology

## 2013-12-23 DIAGNOSIS — I739 Peripheral vascular disease, unspecified: Secondary | ICD-10-CM

## 2013-12-23 LAB — GLUCOSE, CAPILLARY
GLUCOSE-CAPILLARY: 206 mg/dL — AB (ref 70–99)
Glucose-Capillary: 144 mg/dL — ABNORMAL HIGH (ref 70–99)
Glucose-Capillary: 184 mg/dL — ABNORMAL HIGH (ref 70–99)
Glucose-Capillary: 151 mg/dL — ABNORMAL HIGH (ref 70–99)

## 2013-12-23 MED ORDER — ASPIRIN EC 81 MG PO TBEC
81.0000 mg | DELAYED_RELEASE_TABLET | Freq: Every day | ORAL | Status: DC
  Administered 2013-12-24 – 2013-12-25 (×2): 81 mg via ORAL
  Filled 2013-12-23 (×2): qty 1

## 2013-12-23 MED ORDER — CLOPIDOGREL BISULFATE 75 MG PO TABS
75.0000 mg | ORAL_TABLET | Freq: Every day | ORAL | Status: DC
  Administered 2013-12-24 – 2013-12-25 (×2): 75 mg via ORAL
  Filled 2013-12-23 (×3): qty 1

## 2013-12-23 NOTE — Progress Notes (Signed)
Physical Therapy Treatment Patient Details Name: [name] MRN: [id number] DOB: 1933-08-03 Today's Date: 12/23/2013    History of Present Illness 80 y.o. male admitted to [facility] on 12/16/13 after granddaughter found him on the floor by the bed.  Per chart he had garbeled speech and progressive right upper extremity weakness.  Stroke workup in progress as well as assessment for seizure (pt refused EEG).  CT scan revealed, "Hypoattenuation of the left frontal and right posterior watershed regions represents an interval change compared 04/15/2013 consistent with interval ischemic insults, however both regions have an appearance suggestive of subacute to chronic processes."  Pt with significant PMHx of bil BKA, DM, HTN, CHF, CAD, s/p burr hole for SDH 02/2013.      PT Comments    Pt continues to push against mobility attempts even rolling in bed.  He is strong (on his left), but resistant (physically pushing in the opposite direction), so attempts at mobility or even cleaning him are difficult.  Wife continues to seek SNF placement at discharge.   Follow Up Recommendations  SNF     Equipment Recommendations  None recommended by PT    Recommendations for Other Services   NA     Precautions / Restrictions Precautions Precautions: Fall Precaution Comments: h/o falls    Mobility  Bed Mobility Overal bed mobility: +2 for physical assistance Bed Mobility: Rolling           General bed mobility comments: rolling bil needed two person total assist, pt pushing in opposite direction against movement.  Unable to attempt sitting EOB due to resistance.    Modified Rankin (Stroke Patients Only) Modified Rankin (Stroke Patients Only) Pre-Morbid Rankin Score: Severe disability Modified Rankin: Severe disability        Cognition Arousal/Alertness: Awake/alert Behavior During Therapy: WFL for tasks assessed/performed Overall Cognitive Status: Impaired/Different from baseline Area of  Impairment: Orientation;Attention;Memory;Following commands;Safety/judgement;Awareness;Problem solving Orientation Level: Disoriented to;Place;Time;Situation Current Attention Level: Focused Memory: Decreased short-term memory Following Commands: Follows one step commands inconsistently Safety/Judgement: Decreased awareness of safety;Decreased awareness of deficits Awareness: Intellectual Problem Solving: Slow processing;Decreased initiation;Difficulty sequencing;Requires verbal cues;Requires tactile cues General Comments: Pt resistant to all mobility, rolling, repeating what therapist and RN are saying in room.     Exercises General Exercises - Upper Extremity Shoulder Flexion: PROM;AAROM;Left;Right;10 reps;Supine Elbow Flexion: PROM;AAROM;Right;Left;10 reps;Supine General Exercises - Lower Extremity Heel Slides: AROM;AAROM;Right;Left;10 reps;Supine Hip ABduction/ADduction: AROM;PROM;Right;Left;10 reps;Supine Straight Leg Raises: PROM;AAROM;Right;Left;10 reps;Supine        Pertinent Vitals/Pain See vitals flow sheet.            PT Goals (current goals can now be found in the care plan section) Acute Rehab PT Goals Patient Stated Goal: wife would like to seek SNF placement Progress towards PT goals: Not progressing toward goals - comment (still resistant to mobility (physically pushing back))    Frequency  Min 2X/week    PT Plan Current plan remains appropriate            Time: 1730-1748 PT Time Calculation (min): 18 min  Charges:  $Therapeutic Exercise: 8-22 mins                      Robinn Overholt B. [place], [place], DPT [phone]   12/23/2013, 5:59 PM

## 2013-12-23 NOTE — Progress Notes (Signed)
PATIENT DETAILS Name: [name] Age: 80 y.o. Sex: male Date of Birth: 06-05-1933 Admit Date: 12/16/2013 Admitting Physician Ripudeep Krystal Eaton, MD [id number], Gwyndolyn Saxon, MD  Subjective: No major complaints this am. Uneventful night-spouse at bedside.  Assessment/Plan: Principal Problem:  Subacute CVA (cerebral infarction) - Patient was admitted on 12/16/13 after he was found by his granddaughter with garbled speech and significant upper extremity weakness. Per family, they have been noticing that the patient's right upper extremity has been weak for a few weeks prior to this admission. Initial CT of the head on 5/11 showed infarct in the left frontal and right posterior watershed lesions.MRI could not be done attempted x3 due to patient non cooperation. On admission neurology was consulted, patient underwent further workup which included a carotid Doppler which showed 1-39% right ICA stenosis, left ICA was occluded. He also underwent a transthoracic echocardiogram on 5/13, which showed an EF around 50%. Given the fact that patient had multiple areas of infarction, likely cardioembolic, he underwent transesophageal echocardiogram on 5/15 which showed global hypokinesis with an EF around 25-30%, it also showed smoked in the left atrial cavity. Neurology subsequently recommended patient be started on Eliquis. He is to have significant weakness in his right upper/lower extremity. Telemetry shows normal sinus rhythm with no evidence of A. Fib/flutter. Please see below for further details regarding cardiomyopathy - Further workup revealed LDL 40, and hemoglobin A1c 7.4. - Current plans are to discharge to skilled nursing facility once workup is complete. -Please note-Dr Candiss Norse and myself have discussed with son and wife patient needs to be careful from fall standpoint in the future. Hopefully will go to SNF now and will be in a monitored setting. They both understand the risks and benefits of being vs  not being on anticoagulation.  Chronic systolic heart failure - Since patient had a CVA, patient underwent transthoracic echocardiogram which showed preserved ejection fraction, however since the suspicion for cardioembolic CVA was high, patient then underwent a transesophageal echocardiogram which showed a significantly low EF around 25-30%. Since there is a big discrepancy between the 2 studies, cardiology has been consulted, patient remains on Eliquis given smoke seen on the left atrium on TEE. Repeat TTE on 5/15 confirms low EF. Await cardiology and Neurology recommendations.  - Clinically compensated, being managed with low-dose lisinopril and Coreg.  H/O SDH-July 2014 - Stable-difficult situation in his head significant falls in the past with subdural hematoma requiring evacuation in 2014. See anti-coagulation discussion above.  Hx of Seizures -c/w Keppra-stable  Diabetes - CBGs stable, continue with Lantus and SSI. -A1c 7.4.  History of CAD - No acute issues, not on antiplatelet agents-as on Eliquis. Continue statins and beta blockers.  HYPERLIPIDEMIA  - Continue statins and Zetia - LDL 40  HYPERTENSION  - Moderate controlled with lisinopril and Coreg  S/P bilateral BKA (below knee amputation)  Disposition: Remain inpatient-SNF on discharge  DVT Prophylaxis: Not needed as on Eliquis  Code Status: Full code   Family Communication Son over the phone, spouse at bedside.  Procedures:  TEE-5/15  CONSULTS:  cardiology and neurology  MEDICATIONS: Scheduled Meds: . apixaban  5 mg Oral BID  . carvedilol  3.125 mg Oral BID WC  . darifenacin  7.5 mg Oral Daily  . ezetimibe  10 mg Oral Daily  . insulin aspart  0-5 Units Subcutaneous QHS  . insulin aspart  0-9 Units Subcutaneous TID WC  . insulin glargine  8 Units Subcutaneous Q  breakfast  . levETIRAcetam  500 mg Oral BID  . lisinopril  2.5 mg Oral Daily  . simvastatin  40 mg Oral QHS  . tamsulosin  0.4 mg Oral  Daily   Continuous Infusions:   PRN Meds:.acetaminophen, acetaminophen, haloperidol lactate, senna-docusate  Antibiotics: Anti-infectives   None       PHYSICAL EXAM: Vital signs in last 24 hours: Filed Vitals:   12/23/13 0227 12/23/13 0543 12/23/13 0546 12/23/13 1020  BP: 119/56 114/54 140/59 121/73  Pulse: 67 66 71 73  Temp: 97.8 F (36.6 C) 97.6 F (36.4 C) 98.8 F (37.1 C) 98.5 F (36.9 C)  TempSrc: Axillary Oral Oral Oral  Resp: 20 20 20 20   Height:      Weight:      SpO2: 94% 99% 99% 93%    Weight change:  Filed Weights   12/22/13 1300  Weight: 98.476 kg (217 lb 1.6 oz)   Body mass index is 30.29 kg/(m^2).   Gen Exam: Awake and alert  Neck: Supple, No JVD.   Chest: B/L Clear. Abdomen: soft, BS +, non tender, non distended.  Extremities: b/l Bka Neurologic: Still with significant right upper ext weakness Skin: No Rash.   Wounds: N/A.  Intake/Output from previous day:  Intake/Output Summary (Last 24 hours) at 12/23/13 1122 Last data filed at 12/23/13 0900  Gross per 24 hour  Intake    240 ml  Output    750 ml  Net   -510 ml     LAB RESULTS: CBC  Recent Labs Lab 12/20/13 0915  WBC 7.0  HGB 13.2  HCT 39.8  PLT 208  MCV 95.4  MCH 31.7  MCHC 33.2  RDW 14.2    Chemistries   Recent Labs Lab 12/20/13 0915  NA 140  K 4.6  CL 107  CO2 20  GLUCOSE 180*  BUN 25*  CREATININE 1.08  CALCIUM 9.0    CBG:  Recent Labs Lab 12/22/13 0648 12/22/13 1123 12/22/13 1656 12/22/13 2111 12/23/13 0649  GLUCAP 163* 199* 204* 172* 144*    GFR Estimated Creatinine Clearance: 67.5 ml/min (by C-G formula based on Cr of 1.08).  Coagulation profile No results found for this basename: INR, PROTIME,  in the last 168 hours  Cardiac Enzymes  Recent Labs Lab 12/20/13 1702 12/21/13 0440 12/21/13 1550  TROPONINI <0.30 <0.30 <0.30    No components found with this basename: POCBNP,  No results found for this basename: DDIMER,  in the last  72 hours No results found for this basename: HGBA1C,  in the last 72 hours No results found for this basename: CHOL, HDL, LDLCALC, TRIG, CHOLHDL, LDLDIRECT,  in the last 72 hours No results found for this basename: TSH, T4TOTAL, FREET3, T3FREE, THYROIDAB,  in the last 72 hours No results found for this basename: VITAMINB12, FOLATE, FERRITIN, TIBC, IRON, RETICCTPCT,  in the last 72 hours No results found for this basename: LIPASE, AMYLASE,  in the last 72 hours  Urine Studies No results found for this basename: UACOL, UAPR, USPG, UPH, UTP, UGL, UKET, UBIL, UHGB, UNIT, UROB, ULEU, UEPI, UWBC, URBC, UBAC, CAST, CRYS, UCOM, BILUA,  in the last 72 hours  MICROBIOLOGY: No results found for this or any previous visit (from the past 240 hour(s)).  RADIOLOGY STUDIES/RESULTS: Dg Chest 1 View  12/20/2013   CLINICAL DATA:  Cough, hypertension  EXAM: CHEST - 1 VIEW  COMPARISON:  12/16/2013  FINDINGS: Persistent low lung volumes with streaky bibasilar densities compatible with atelectasis. Stable  prominent hilar vascularity. No effusion or pneumothorax. Trachea is midline. Degenerative changes noted of the spine.  IMPRESSION: Stable low lung volumes and basilar atelectasis.   Electronically Signed   By: Daryll Brod M.D.   On: 12/20/2013 08:39   Ct Head Wo Contrast  12/20/2013   CLINICAL DATA:  Worsening mental status following CVA  EXAM: CT HEAD WITHOUT CONTRAST  TECHNIQUE: Contiguous axial images were obtained from the base of the skull through the vertex without intravenous contrast.  COMPARISON:  CT HEAD W/O CM dated 12/16/2013; CT HEAD W/O CM dated 04/15/2013  FINDINGS: Similar findings advanced atrophy and sulcal prominence. Stable sequela of right frontal calvarial burr hole with associated prominence of the extra-axial space about the right frontal lobe and chronic adjacent subdural thickening. No definite acute intraparenchymal or extra-axial hemorrhage.  Asymmetric ill-defined areas of hypoattenuating  attenuation within the left frontal and parietal lobe appear grossly unchanged. Ill-defined geographic area of hypoattenuation within the right parietal-occipital lobe (image 20) is also unchanged. Given extensive back parenchymal abnormalities, there is no CT evidence of acute large territory infarct. No intraparenchymal or extra-axial mass or hemorrhage. Unchanged size and configuration of the ventricles and basilar cisterns. No midline shift. Intracranial atherosclerosis. Regional soft tissues appear normal. Post bilateral cataract surgery. No displaced calvarial fracture. Limited visualization of the paranasal sinuses and mastoid air cells appear normal.  IMPRESSION: 1. No acute intracranial process. 2. Similar findings of chronic/subacute watershed infarcts within the right parietal occipital and left frontal parietal lobes without interval change. 3. Stable sequela of prior right frontal lobe subdural hematoma. 4. Stable findings of atrophy and microvascular ischemic disease.   Electronically Signed   By: Sandi Mariscal M.D.   On: 12/20/2013 08:31   Ct Head Wo Contrast  12/16/2013   CLINICAL DATA:  Dysarthria, right facial droop  EXAM: CT HEAD WITHOUT CONTRAST  TECHNIQUE: Contiguous axial images were obtained from the base of the skull through the vertex without intravenous contrast.  COMPARISON:  Prior head CT 04/15/2013  FINDINGS: Resolving heterogeneous extra-axial collection overlying the right frontal convexity consistent with a chronic subdural hematoma/ hygroma. The collection is smaller compared to 04/15/2013. New ill-defined hypoattenuation extending to the cortex in the left frontal watershed region which is an interval finding compared to 04/15/2013. There appears to be some associated encephalomalacia and is favored to be subacute to remote. Similarly, there is a new focus of hypoattenuation in the right posterior watershed region which also has an appearance suggestive of a subacute to remote  process. Additionally, there is global cerebral and cerebellar atrophy and sequelae of chronic microvascular ischemic white matter disease. No focal soft tissue or calvarial abnormality. Normal aeration of the mastoid air cells and paranasal sinuses. Burr hole defect in the right frontal calvarium.  IMPRESSION: 1. No definite acute intracranial abnormality. 2. Hypoattenuation of the left frontal and right posterior watershed regions represents an interval change compared 04/15/2013 consistent with interval ischemic insults, however both regions have an appearance suggestive of subacute to chronic processes. 3. Slight interval improvement in chronic right frontal subdural hematoma/hygroma. 4. Atrophy and chronic microvascular ischemic white matter disease.   Electronically Signed   By: Jacqulynn Cadet M.D.   On: 12/16/2013 09:30   Ct Angio Neck W/cm &/or Wo/cm  12/21/2013   CLINICAL DATA:  Stroke. Confusion and aphasia. Right-sided weakness.  EXAM: CT ANGIOGRAPHY HEAD AND NECK  TECHNIQUE: Multidetector CT imaging of the head and neck was performed using the standard protocol during bolus administration  of intravenous contrast. Multiplanar CT image reconstructions and MIPs were obtained to evaluate the vascular anatomy. Carotid stenosis measurements (when applicable) are obtained utilizing NASCET criteria, using the distal internal carotid diameter as the denominator.  CONTRAST:  16mL OMNIPAQUE IOHEXOL 350 MG/ML SOLN  COMPARISON:  None.  CT head 12/20/2013  FINDINGS: CTA HEAD FINDINGS  Ill-defined hypodensity in the left frontal parietal lobe suspicious for subacute infarct. There are scattered small areas of lower density in the left frontal parietal lobe over the convexity consistent with infarction. These were not present on 04/15/2013 but were present on the admitting CT of 12/16/2013. This infarct is difficult to date with certainty but I suspected is subacute. Generalized atrophy. Chronic infarct in the left  occipital lobe. Prior burr hole drainage of a subdural hematoma on the right. Mild low-density extra-axial fluid collection on the right is unchanged. This shows some dural enhancement indicative of chronic subdural hematoma and prior surgery. No acute hemorrhage identified. Postcontrast imaging reveals no enhancing mass lesion.  Left internal carotid artery is occluded in the neck and reconstitutes in the supra clinoid portion. The cavernous carotid is heavily calcified bilaterally with moderately severe stenosis on the right and occlusion on the left. Right middle cerebral artery is patent with a mild to moderate stenosis in the M1 segment. Both anterior cerebral arteries are patent with mild disease. The left MCA territory is hypoperfused as it is supplied by collateral vessels.  Atherosclerotic calcification in the distal vertebral artery bilaterally. Both vertebral arteries contribute to the basilar. The basilar shows diffuse mild disease. Moderate atherosclerotic disease in the posterior cerebral arteries bilaterally.  Negative for cerebral aneurysm.  Review of the MIP images confirms the above findings.  CTA NECK FINDINGS  Proximal great vessels are patent. No mass or adenopathy in the neck.  A right carotid: Right common carotid artery widely patent. Mild atherosclerotic disease the carotid bifurcation without significant stenosis. Right internal carotid artery is patent to the skullbase.  Left carotid: Left common carotid artery widely patent. There is atherosclerotic disease of the carotid bifurcation with occlusion of the left internal carotid artery just past the origin. This vessel is occluded through the cavernous segment.  Vertebral arteries: Scattered atherosclerotic calcification is present in the vertebral arteries bilaterally including the origin the right vertebral artery in the distal vertebral arteries bilaterally. Both vertebral arteries are patent to the basilar.  Review of the MIP images  confirms the above findings.  IMPRESSION: Left MCA infarct, suspect this is subacute in duration.  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 There is reconstitution of the left supra clinoid internal carotid artery by collateral vessels. Left MCA territory is patent but hypoperfused.  There is moderate to severe diffuse intracranial atherosclerotic disease.   Electronically Signed   By: Marlan Palau M.D.   On: 12/21/2013 14:02   Dg Chest Port 1 View  12/16/2013   CLINICAL DATA:  Right-side weakness. Confusion. Diabetic hypertensive patient. History of testicular cancer.  EXAM: PORTABLE CHEST - 1 VIEW  COMPARISON:  03/04/2013.  FINDINGS: Heart size top-normal.  Mild central pulmonary vascular prominence.  Evaluation lung bases slightly limited.  No segmental consolidation.  No plain film evidence of pulmonary malignancy detected on this decreased inspiratory portable exam. Patient would eventually benefit from followup two view chest.  Acromioclavicular joint degenerative changes.  IMPRESSION: Mild central pulmonary vascular prominence and top-normal heart size. Please see above.   Electronically Signed   By: Kandice Hams.D.  On: 12/16/2013 17:28    Shanker Kristeen Mans, MD  [facility] Pager:[phone]  If 7PM-7AM, please contact night-coverage [email or web address] Password [email or web address] 12/23/2013, 11:22 AM   LOS: 7 days   **Disclaimer: This note may have been dictated with voice recognition software. Similar sounding words can inadvertently be transcribed and this note may contain transcription errors which may not have been corrected upon publication of note.**

## 2013-12-23 NOTE — Progress Notes (Signed)
Patient: [name] / Admit Date: 12/16/2013 / Date of Encounter: 12/23/2013, 10:50 AM  Subjective  Remains aphasic.  Objective   Telemetry: NSR occ PVCs  Physical Exam: Blood pressure 121/73, pulse 73, temperature 98.5 F (36.9 C), temperature source Oral, resp. rate 20, height 5\' 11"  (1.803 m), weight 217 lb 1.6 oz (98.476 kg), SpO2 93.00%. General: Well developed, well nourished AAM in no acute distress. Head: Normocephalic, atraumatic, sclera non-icteric, no xanthomas, nares are without discharge. Neck: Supple. Lungs: Clear bilaterally to auscultation without wheezes, rales, or rhonchi. Breathing is unlabored. Heart: RRR S1 S2 without murmurs, rubs, or gallops.  Abdomen: Soft, non-tender, non-distended with normoactive bowel sounds. No rebound/guarding. Extremities: s/p bilateral LE amputations Neuro: Aphasic speech but awake, alert.   Intake/Output Summary (Last 24 hours) at 12/23/13 1050 Last data filed at 12/23/13 0900  Gross per 24 hour  Intake    240 ml  Output    750 ml  Net   -510 ml    Inpatient Medications:  . apixaban  5 mg Oral BID  . carvedilol  3.125 mg Oral BID WC  . darifenacin  7.5 mg Oral Daily  . ezetimibe  10 mg Oral Daily  . insulin aspart  0-5 Units Subcutaneous QHS  . insulin aspart  0-9 Units Subcutaneous TID WC  . insulin glargine  8 Units Subcutaneous Q breakfast  . levETIRAcetam  500 mg Oral BID  . lisinopril  2.5 mg Oral Daily  . simvastatin  40 mg Oral QHS  . tamsulosin  0.4 mg Oral Daily   Infusions:    Labs: No results found for this basename: NA, K, CL, CO2, GLUCOSE, BUN, CREATININE, CALCIUM, MG, PHOS,  in the last 72 hours No results found for this basename: AST, ALT, ALKPHOS, BILITOT, PROT, ALBUMIN,  in the last 72 hours No results found for this basename: WBC, NEUTROABS, HGB, HCT, MCV, PLT,  in the last 72 hours  Recent Labs  12/20/13 1702 12/21/13 0440 12/21/13 1550  TROPONINI <0.30 <0.30 <0.30   No components found  with this basename: POCBNP,  No results found for this basename: HGBA1C,  in the last 72 hours   Radiology/Studies:  Ct Angio Head W/cm &/or Wo Cm  12/23/2013   CLINICAL DATA:  Stroke. Confusion and aphasia. Right-sided weakness.  EXAM: CT ANGIOGRAPHY HEAD AND NECK  TECHNIQUE: Multidetector CT imaging of the head and neck was performed using the standard protocol during bolus administration of intravenous contrast. Multiplanar CT image reconstructions and MIPs were obtained to evaluate the vascular anatomy. Carotid stenosis measurements (when applicable) are obtained utilizing NASCET criteria, using the distal internal carotid diameter as the denominator.  CONTRAST:  89mL OMNIPAQUE IOHEXOL 350 MG/ML SOLN  COMPARISON:  None.  CT head 12/20/2013  FINDINGS: CTA HEAD FINDINGS  Ill-defined hypodensity in the left frontal parietal lobe suspicious for subacute infarct. There are scattered small areas of lower density in the left frontal parietal lobe over the convexity consistent with infarction. These were not present on 04/15/2013 but were present on the admitting CT of 12/16/2013. This infarct is difficult to date with certainty but I suspected is subacute. Generalized atrophy. Chronic infarct in the left occipital lobe. Prior burr hole drainage of a subdural hematoma on the right. Mild low-density extra-axial fluid collection on the right is unchanged. This shows some dural enhancement indicative of chronic subdural hematoma and prior surgery. No acute hemorrhage identified. Postcontrast imaging reveals no enhancing mass lesion.  Left internal carotid artery is occluded  in the neck and reconstitutes in the supra clinoid portion. The cavernous carotid is heavily calcified bilaterally with moderately severe stenosis on the right and occlusion on the left. Right middle cerebral artery is patent with a mild to moderate stenosis in the M1 segment. Both anterior cerebral arteries are patent with mild disease. The left  MCA territory is hypoperfused as it is supplied by collateral vessels.  Atherosclerotic calcification in the distal vertebral artery bilaterally. Both vertebral arteries contribute to the basilar. The basilar shows diffuse mild disease. Moderate atherosclerotic disease in the posterior cerebral arteries bilaterally.  Negative for cerebral aneurysm.  Review of the MIP images confirms the above findings.  CTA NECK FINDINGS  Proximal great vessels are patent. No mass or adenopathy in the neck.  A right carotid: Right common carotid artery widely patent. Mild atherosclerotic disease the carotid bifurcation without significant stenosis. Right internal carotid artery is patent to the skullbase.  Left carotid: Left common carotid artery widely patent. There is atherosclerotic disease of the carotid bifurcation with occlusion of the left internal carotid artery just past the origin. This vessel is occluded through the cavernous segment.  Vertebral arteries: Scattered atherosclerotic calcification is present in the vertebral arteries bilaterally including the origin the right vertebral artery in the distal vertebral arteries bilaterally. Both vertebral arteries are patent to the basilar.  Review of the MIP images confirms the above findings.  IMPRESSION: Left MCA infarct, suspect this is subacute in duration.  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 There is reconstitution of the left supra clinoid internal carotid artery by collateral vessels. Left MCA territory is patent but hypoperfused.  There is moderate to severe diffuse intracranial atherosclerotic disease.   Electronically Signed   By: Franchot Gallo M.D.   On: 12/23/2013 08:28   Dg Chest 1 View  12/20/2013   CLINICAL DATA:  Cough, hypertension  EXAM: CHEST - 1 VIEW  COMPARISON:  12/16/2013  FINDINGS: Persistent low lung volumes with streaky bibasilar densities compatible with atelectasis. Stable prominent hilar vascularity. No effusion or  pneumothorax. Trachea is midline. Degenerative changes noted of the spine.  IMPRESSION: Stable low lung volumes and basilar atelectasis.   Electronically Signed   By: Daryll Brod M.D.   On: 12/20/2013 08:39   Ct Head Wo Contrast  12/20/2013   CLINICAL DATA:  Worsening mental status following CVA  EXAM: CT HEAD WITHOUT CONTRAST  TECHNIQUE: Contiguous axial images were obtained from the base of the skull through the vertex without intravenous contrast.  COMPARISON:  CT HEAD W/O CM dated 12/16/2013; CT HEAD W/O CM dated 04/15/2013  FINDINGS: Similar findings advanced atrophy and sulcal prominence. Stable sequela of right frontal calvarial burr hole with associated prominence of the extra-axial space about the right frontal lobe and chronic adjacent subdural thickening. No definite acute intraparenchymal or extra-axial hemorrhage.  Asymmetric ill-defined areas of hypoattenuating attenuation within the left frontal and parietal lobe appear grossly unchanged. Ill-defined geographic area of hypoattenuation within the right parietal-occipital lobe (image 20) is also unchanged. Given extensive back parenchymal abnormalities, there is no CT evidence of acute large territory infarct. No intraparenchymal or extra-axial mass or hemorrhage. Unchanged size and configuration of the ventricles and basilar cisterns. No midline shift. Intracranial atherosclerosis. Regional soft tissues appear normal. Post bilateral cataract surgery. No displaced calvarial fracture. Limited visualization of the paranasal sinuses and mastoid air cells appear normal.  IMPRESSION: 1. No acute intracranial process. 2. Similar findings of chronic/subacute watershed infarcts within the right  parietal occipital and left frontal parietal lobes without interval change. 3. Stable sequela of prior right frontal lobe subdural hematoma. 4. Stable findings of atrophy and microvascular ischemic disease.   Electronically Signed   By: Sandi Mariscal M.D.   On:  12/20/2013 08:31   Ct Head Wo Contrast  12/16/2013   CLINICAL DATA:  Dysarthria, right facial droop  EXAM: CT HEAD WITHOUT CONTRAST  TECHNIQUE: Contiguous axial images were obtained from the base of the skull through the vertex without intravenous contrast.  COMPARISON:  Prior head CT 04/15/2013  FINDINGS: Resolving heterogeneous extra-axial collection overlying the right frontal convexity consistent with a chronic subdural hematoma/ hygroma. The collection is smaller compared to 04/15/2013. New ill-defined hypoattenuation extending to the cortex in the left frontal watershed region which is an interval finding compared to 04/15/2013. There appears to be some associated encephalomalacia and is favored to be subacute to remote. Similarly, there is a new focus of hypoattenuation in the right posterior watershed region which also has an appearance suggestive of a subacute to remote process. Additionally, there is global cerebral and cerebellar atrophy and sequelae of chronic microvascular ischemic white matter disease. No focal soft tissue or calvarial abnormality. Normal aeration of the mastoid air cells and paranasal sinuses. Burr hole defect in the right frontal calvarium.  IMPRESSION: 1. No definite acute intracranial abnormality. 2. Hypoattenuation of the left frontal and right posterior watershed regions represents an interval change compared 04/15/2013 consistent with interval ischemic insults, however both regions have an appearance suggestive of subacute to chronic processes. 3. Slight interval improvement in chronic right frontal subdural hematoma/hygroma. 4. Atrophy and chronic microvascular ischemic white matter disease.   Electronically Signed   By: Jacqulynn Cadet M.D.   On: 12/16/2013 09:30   Ct Angio Neck W/cm &/or Wo/cm  12/21/2013   CLINICAL DATA:  Stroke. Confusion and aphasia. Right-sided weakness.  EXAM: CT ANGIOGRAPHY HEAD AND NECK  TECHNIQUE: Multidetector CT imaging of the head and neck  was performed using the standard protocol during bolus administration of intravenous contrast. Multiplanar CT image reconstructions and MIPs were obtained to evaluate the vascular anatomy. Carotid stenosis measurements (when applicable) are obtained utilizing NASCET criteria, using the distal internal carotid diameter as the denominator.  CONTRAST:  64mL OMNIPAQUE IOHEXOL 350 MG/ML SOLN  COMPARISON:  None.  CT head 12/20/2013  FINDINGS: CTA HEAD FINDINGS  Ill-defined hypodensity in the left frontal parietal lobe suspicious for subacute infarct. There are scattered small areas of lower density in the left frontal parietal lobe over the convexity consistent with infarction. These were not present on 04/15/2013 but were present on the admitting CT of 12/16/2013. This infarct is difficult to date with certainty but I suspected is subacute. Generalized atrophy. Chronic infarct in the left occipital lobe. Prior burr hole drainage of a subdural hematoma on the right. Mild low-density extra-axial fluid collection on the right is unchanged. This shows some dural enhancement indicative of chronic subdural hematoma and prior surgery. No acute hemorrhage identified. Postcontrast imaging reveals no enhancing mass lesion.  Left internal carotid artery is occluded in the neck and reconstitutes in the supra clinoid portion. The cavernous carotid is heavily calcified bilaterally with moderately severe stenosis on the right and occlusion on the left. Right middle cerebral artery is patent with a mild to moderate stenosis in the M1 segment. Both anterior cerebral arteries are patent with mild disease. The left MCA territory is hypoperfused as it is supplied by collateral vessels.  Atherosclerotic calcification in the  distal vertebral artery bilaterally. Both vertebral arteries contribute to the basilar. The basilar shows diffuse mild disease. Moderate atherosclerotic disease in the posterior cerebral arteries bilaterally.  Negative  for cerebral aneurysm.  Review of the MIP images confirms the above findings.  CTA NECK FINDINGS  Proximal great vessels are patent. No mass or adenopathy in the neck.  A right carotid: Right common carotid artery widely patent. Mild atherosclerotic disease the carotid bifurcation without significant stenosis. Right internal carotid artery is patent to the skullbase.  Left carotid: Left common carotid artery widely patent. There is atherosclerotic disease of the carotid bifurcation with occlusion of the left internal carotid artery just past the origin. This vessel is occluded through the cavernous segment.  Vertebral arteries: Scattered atherosclerotic calcification is present in the vertebral arteries bilaterally including the origin the right vertebral artery in the distal vertebral arteries bilaterally. Both vertebral arteries are patent to the basilar.  Review of the MIP images confirms the above findings.  IMPRESSION: Left MCA infarct, suspect this is subacute in duration.  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 There is reconstitution of the left supra clinoid internal carotid artery by collateral vessels. Left MCA territory is patent but hypoperfused.  There is moderate to severe diffuse intracranial atherosclerotic disease.   Electronically Signed   By: Franchot Gallo M.D.   On: 12/21/2013 14:02   Dg Chest Port 1 View  12/16/2013   CLINICAL DATA:  Right-side weakness. Confusion. Diabetic hypertensive patient. History of testicular cancer.  EXAM: PORTABLE CHEST - 1 VIEW  COMPARISON:  03/04/2013.  FINDINGS: Heart size top-normal.  Mild central pulmonary vascular prominence.  Evaluation lung bases slightly limited.  No segmental consolidation.  No plain film evidence of pulmonary malignancy detected on this decreased inspiratory portable exam. Patient would eventually benefit from followup two view chest.  Acromioclavicular joint degenerative changes.  IMPRESSION: Mild central  pulmonary vascular prominence and top-normal heart size. Please see above.   Electronically Signed   By: Chauncey Cruel M.D.   On: 12/16/2013 17:28     Assessment and Plan  1. Subacute bihemispheric CVA 2. Possible low LVEF - 50% by TTE 5/13, 25-30% by TEE 5/15 (h/o EF 20-25% in 2006), 25-30% by TTE 12/20/13 3. Diabetes mellitus c/b neuropathy, uncontrolled with A1C 7.4 4. HTN 5. CAD s/p stent to OM1 remotely, DES to [facility] 2006 6. PVD s/p bilateral LE amputations 7. Occlusion of left internal carotid artery per dopplers 12/18/13 8. Seizure disorder on Keppra 9. H/o right SDH s/p Trudee Kuster hole for evacuation 2014  Neurology has placed patient on Eliquis for stroke given multiterritory infarct and smoke on TEE. However, they are now asking IM if this is OK. Will ask Dr. Gwenlyn Found to weigh in on this. Dr. Harl Bowie felt like this was appropriate in yesterday's note. If he does not go home on anticoag, would consider loop recorder implantation.  There has been some confusion about discrepancy in EF this admission (see above). Repeat TTE from 5/15 confirms low EF which is consistent with patient's history. Dr. Sloan Leiter was able to get hard copy of echo from 12/20/13 - he reports EF as 25-30%, no RWMA, grade 1 d/d, RV systolic fcn normal. For now the patient remains on appropriate therapy regardless for LV dysfunction including Coreg and ACEI. Will defer discussion of ICD implantation to outpatient setting. Appears euvolemic. Will defer to Dr. Gwenlyn Found whether or not chronic LICA has to be managed/followed in some way in the setting of  this stroke. Have scheduled him for followup appt in our office 01/13/14 at 2pm.  Signed, Melina Copa PA-C  Agree with note by Melina Copa PA-C Pt admitted for CVA. Has an occluded LICA by CTA, NSR. His TTE shows severe LV dysf with LA smoke but no thrombus. He is on appropriate meds for LVD. Per DR. Sethi there is NO EVIDENCE for oral AC with Eliquis if no documented Afib, therefore Rx with  ASA/Plavix. Will arrange to have a loop recorder implanted prior to D/C to a rehab facility.  Lorretta Harp, M.D., [place], [facility], [place] [address] [place], [place]  [id number]  [phone] 12/23/2013 12:49 PM

## 2013-12-23 NOTE — Progress Notes (Signed)
Stroke Team Progress Note  HISTORY [name] is a 80 y.o. male with history of right SDH S/P burr hole for evacuation for a large chronic subdural hematoma with marked mass effect. Patient also has history of seizures which he currently is on Keppra 500 mg BID. Per wife he is wheel chair bound, has recently shown increased memory decline, able to feed himself but otherwise needs assistance with all ADL's. He has a home health CNA which cares for him.  Over the past month she has noted he does not use his right arm and allows it to hang over his wheel chair. In addition she has noted over the last few days he has had less vision in the right field. On 12/15/2013 at 1600 he was noted to have increased slurred speech but this improved over the night. On the morning of admission, 12/16/2013, he was found on the floor next to his bed and confused by granddaughter. It is not clear how he arrived on the floor. He has no bruises or hematomas. He complains of no pain. Per wife he continues to sound more slurred than usual. Patient was not a TPA candidate secondary to delay in arrival.  SUBJECTIVE Wife at bedside. Remains aphasic and not speaking much  OBJECTIVE Most recent Vital Signs: Filed Vitals:   12/23/13 0227 12/23/13 0543 12/23/13 0546 12/23/13 1020  BP: 119/56 114/54 140/59 121/73  Pulse: 67 66 71 73  Temp: 97.8 F (36.6 C) 97.6 F (36.4 C) 98.8 F (37.1 C) 98.5 F (36.9 C)  TempSrc: Axillary Oral Oral Oral  Resp: 20 20 20 20   Height:      Weight:      SpO2: 94% 99% 99% 93%   CBG (last 3)   Recent Labs  12/22/13 1656 12/22/13 2111 12/23/13 0649  GLUCAP 204* 172* 144*    IV Fluid Intake:      MEDICATIONS  . apixaban  5 mg Oral BID  . carvedilol  3.125 mg Oral BID WC  . darifenacin  7.5 mg Oral Daily  . ezetimibe  10 mg Oral Daily  . insulin aspart  0-5 Units Subcutaneous QHS  . insulin aspart  0-9 Units Subcutaneous TID WC  . insulin glargine  8 Units Subcutaneous Q  breakfast  . levETIRAcetam  500 mg Oral BID  . lisinopril  2.5 mg Oral Daily  . simvastatin  40 mg Oral QHS  . tamsulosin  0.4 mg Oral Daily   PRN:  acetaminophen, acetaminophen, haloperidol lactate, senna-docusate  Diet:  Carb Control thin liquids Activity:  Up with assistance DVT Prophylaxis:  Apixaban  CLINICALLY SIGNIFICANT STUDIES Basic Metabolic Panel:   Recent Labs Lab 12/20/13 0915  NA 140  K 4.6  CL 107  CO2 20  GLUCOSE 180*  BUN 25*  CREATININE 1.08  CALCIUM 9.0   Liver Function Tests:  No results found for this basename: AST, ALT, ALKPHOS, BILITOT, PROT, ALBUMIN,  in the last 168 hours CBC:   Recent Labs Lab 12/20/13 0915  WBC 7.0  HGB 13.2  HCT 39.8  MCV 95.4  PLT 208   Coagulation: No results found for this basename: LABPROT, INR,  in the last 168 hours Cardiac Enzymes:   Recent Labs Lab 12/20/13 1702 12/21/13 0440 12/21/13 1550  TROPONINI <0.30 <0.30 <0.30   Urinalysis:  No results found for this basename: COLORURINE, APPERANCEUR, LABSPEC, PHURINE, GLUCOSEU, HGBUR, BILIRUBINUR, KETONESUR, PROTEINUR, UROBILINOGEN, NITRITE, LEUKOCYTESUR,  in the last 168 hours Lipid Panel  Component Value Date/Time   CHOL 92 12/17/2013 0600   TRIG 61 12/17/2013 0600   HDL 40 12/17/2013 0600   CHOLHDL 2.3 12/17/2013 0600   VLDL 12 12/17/2013 0600   LDLCALC 40 12/17/2013 0600   HgbA1C  Lab Results  Component Value Date   HGBA1C 7.4* 12/17/2013    Urine Drug Screen:   No results found for this basename: labopia,  cocainscrnur,  labbenz,  amphetmu,  thcu,  labbarb    Alcohol Level: No results found for this basename: ETH,  in the last 168 hours  Ct Head Wo Contrast 12/20/2013    1. No acute intracranial process.  2. Similar findings of chronic/subacute watershed infarcts within the right parietal occipital and left frontal parietal lobes without interval change.  3. Stable sequela of prior right frontal lobe subdural hematoma.  4. Stable findings of  atrophy and microvascular ischemic disease.    12/17/2103 1. No definite acute intracranial abnormality.  2. Hypoattenuation of the left frontal and right posterior watershed regions represents an interval change compared 04/15/2013 consistent with interval ischemic insults, however both regions have an appearance suggestive of subacute to chronic processes.  3. Slight interval improvement in chronic right frontal subdural hematoma/hygroma.  4. Atrophy and chronic microvascular ischemic white matter disease.  Ct Angio Neck W/cm &/or Wo/cm 12/21/2013    Left MCA infarct, suspect this is subacute in duration.  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 There is reconstitution of the left supra clinoid internal carotid artery by collateral vessels. Left MCA territory is patent but hypoperfused.  There is moderate to severe diffuse intracranial atherosclerotic disease.     MRI/A of the brain  Patient unable to tolerate.  2D Echocardiogram   12/20/2013 ejection fraction 25-30%  12/18/2013 ejection fraction 50%. No cardiac source of emboli identified.  TEE - Left ventricle: Global hypokinesis; akinetic inferior lateral wall Systolic function was severely reduced. The estimated ejection fraction was in the range of 25% to 30%. - Aortic valve: Trivial regurgitation. - Mitral valve: No evidence of vegetation. - Left atrium: The atrium was moderately to severely dilated.    No evidence of thrombus in the atrial cavity or appendage. There was mild spontaneous echo contrast ("smoke") in the cavity. - Atrial septum: Atrial septal aneurysm  No defect or patent foramen ovale was identified. - Tricuspid valve: No evidence of vegetation. - Pulmonic valve: No evidence of vegetation. Impressions: - Negative saline microcavitation study.  Carotid Doppler   1-39% right ICA stenosis. Left ICA occluded. Right vertebral artery flow antegrade. Left vertebral artery not insonate.   CXR   12/20/2013     Stable low lung volumes and basilar atelectasis.   EKG  normal sinus rhythm  Therapy Recommendations SNF  Physical Exam General: The patient is alert and cooperative at the time of the examination. Skin: Bilateral BKA Neurologic Exam Mental status: The patient is alert, aphasic. Cranial nerves: Facial symmetry is present. Speech is aphasic, he will repeat and name objects, he will not follow verbal commands. Extraocular movements are full. Visual fields are notable for a right HH. Motor: The patient has good strength in the left upper extremity. Right arm is almost plegic, bilateral BKA. Sensory examination: Patient seems to respond to pain sensation on all 4 extremities. Coordination: The patient has inability to follow commands for cerebellar testing. Gait and station: The gait could not be tested. Reflexes: Deep tendon reflexes are symmetric, decreased.  ASSESSMENT Mr. Emileo Semel is a 80  y.o. male presenting with confusion and aphasia. Imaging confirms subacute left MCA infarct and chronic/subacute right parietal occipital infarct. L MCA infarct associated with L ICA occlusion. This infarct felt to be embolic secondary to unknown source, most likely atrial fibrillation. Tele thus far negative for atrial fibrillation.  On no antithrombotic prior to admission. On Eliquis now.   Low EF 25-30% by TEE atrial smoke (TTE - EF 50%)  DM  HTN  HLD, on zocor   CAD  PVD - bilateral BKAs  Diabetic PN  Left internal carotid artery occlusion   Seizure disorder - Keppra  Patient active with [facility] prior to admission  Hospital day # 7  TREATMENT/PLAN  Change Apixaban to aspirin and plavix x 3 months then plavix alone. No confirmed indication for anticoagulation from stroke standpoint.  Dr. Leonie Man recommends placement of an place implantable loop recorder to evaluate for atrial fibrillation as etiology of stroke. This has been explained to patient/family by Dr. Leonie Man and they are  agreeable.  Dispo:  SNF  Dr. Leonie Man discussed diagnosis, prognosis,  treatment options and plan of care with pt, his wife and Dr. Sloan Leiter. And Dr. Gwenlyn Found.  Burnetta Sabin, MSN, RN, ANVP-BC, ANP-BC, GNP-BC [facility] Pager: [phone] 12/23/2013 12:00 PM  I have personally obtained a history, examined the patient, evaluated imaging results, and formulated the assessment and plan of care. I agree with the above. Antony Contras, MD

## 2013-12-24 ENCOUNTER — Other Ambulatory Visit: Payer: Self-pay | Admitting: Physician Assistant

## 2013-12-24 DIAGNOSIS — R002 Palpitations: Secondary | ICD-10-CM

## 2013-12-24 DIAGNOSIS — I6200 Nontraumatic subdural hemorrhage, unspecified: Secondary | ICD-10-CM

## 2013-12-24 DIAGNOSIS — S88119A Complete traumatic amputation at level between knee and ankle, unspecified lower leg, initial encounter: Secondary | ICD-10-CM

## 2013-12-24 LAB — GLUCOSE, CAPILLARY
GLUCOSE-CAPILLARY: 114 mg/dL — AB (ref 70–99)
GLUCOSE-CAPILLARY: 184 mg/dL — AB (ref 70–99)
GLUCOSE-CAPILLARY: 161 mg/dL — AB (ref 70–99)
GLUCOSE-CAPILLARY: 147 mg/dL — AB (ref 70–99)

## 2013-12-24 MED ORDER — INSULIN ASPART 100 UNIT/ML ~~LOC~~ SOLN: SUBCUTANEOUS | Status: DC

## 2013-12-24 MED ORDER — ASPIRIN 81 MG PO TBEC: 81.0000 mg | DELAYED_RELEASE_TABLET | Freq: Every day | ORAL | Status: DC

## 2013-12-24 MED ORDER — HEPARIN SODIUM (PORCINE) 5000 UNIT/ML IJ SOLN
5000.0000 [IU] | Freq: Three times a day (TID) | INTRAMUSCULAR | Status: DC
Start: 2013-12-24 — End: 2013-12-25
  Administered 2013-12-24: 5000 [IU] via SUBCUTANEOUS
  Filled 2013-12-24 (×7): qty 1

## 2013-12-24 MED ORDER — CARVEDILOL 3.125 MG PO TABS: 3.1250 mg | ORAL_TABLET | Freq: Two times a day (BID) | ORAL | Status: DC

## 2013-12-24 MED ORDER — CLOPIDOGREL BISULFATE 75 MG PO TABS: 75.0000 mg | ORAL_TABLET | Freq: Every day | ORAL | Status: DC

## 2013-12-24 MED ORDER — LISINOPRIL 2.5 MG PO TABS: 2.5000 mg | ORAL_TABLET | Freq: Every day | ORAL | Status: DC

## 2013-12-24 NOTE — Discharge Summary (Signed)
PATIENT DETAILS Name: [name] Age: 80 y.o. Sex: male Date of Birth: 1933-07-17 MRN: [id number]. Admit Date: 12/16/2013 Admitting Physician: Mendel Corning, MD [id number], Gwyndolyn Saxon, MD  Recommendations for Outpatient Follow-up:  1. Needs follow up Cardiology for 30 day event monitor 2. Please check CBC, Electrolytes periodically while at SNF  PRIMARY DISCHARGE DIAGNOSIS:  Principal Problem:   CVA (cerebral infarction) Active Problems:   DIABETES MELLITUS   HYPERLIPIDEMIA   HYPERTENSION   CAD   Right arm weakness   S/P bilateral BKA (below knee amputation)      PAST MEDICAL HISTORY: Past Medical History  Diagnosis Date  . Diabetes mellitus   . Hypertension   . Testicular cancer dx'd 08/2010    surg only  . CHF (congestive heart failure)   . CAD (coronary artery disease)     s/p stent to OM1 remotely, DES to mRCA 2006  . Peripheral vascular disease   . Vitamin B12 deficiency   . Diabetic retinopathy   . Diabetic peripheral neuropathy   . Cholelithiasis   . Family history of colon cancer     brother   . 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 Osteomyelitis of finger of right hand 03/2013    R middle finger  . Pressure ulcer of buttock 03/2013    stage 2    DISCHARGE MEDICATIONS:   Medication List    STOP taking these medications       diphenhydrAMINE 25 mg capsule  Commonly known as:  BENADRYL     HYDROcodone-acetaminophen 5-325 MG per tablet  Commonly known as:  NORCO/VICODIN     traMADol 50 MG tablet  Commonly known as:  ULTRAM     VOLTAREN 1 % Gel  Generic drug:  diclofenac sodium      TAKE these medications       aspirin 81 MG EC tablet  Take 1 tablet (81 mg total) by mouth daily.     carvedilol 3.125 MG tablet  Commonly known as:  COREG  Take 1 tablet (3.125 mg total) by mouth 2 (two) times daily with a meal.     clopidogrel 75 MG tablet  Commonly known as:  PLAVIX  Take 1 tablet (75 mg total) by mouth daily with breakfast.     ezetimibe 10 MG  tablet  Commonly known as:  ZETIA  Take 10 mg by mouth daily.     insulin aspart 100 UNIT/ML injection  Commonly known as:  novoLOG  - 0-9 Units, Subcutaneous, 3 times daily with meals  - CBG < 70: implement hypoglycemia protocol  - CBG 70 - 120: 0 units  - CBG 121 - 150: 1 unit  - CBG 151 - 200: 2 units  - CBG 201 - 250: 3 units  - CBG 251 - 300: 5 units  - CBG 301 - 350: 7 units  - CBG 351 - 400: 9 units  - CBG > 400: call MD     insulin glargine 100 UNIT/ML injection  Commonly known as:  LANTUS  Inject 8 Units into the skin daily with breakfast.     levETIRAcetam 500 MG tablet  Commonly known as:  KEPPRA  Take 1 tablet (500 mg total) by mouth 2 (two) times daily.     lisinopril 2.5 MG tablet  Commonly known as:  PRINIVIL,ZESTRIL  Take 1 tablet (2.5 mg total) by mouth daily.     simvastatin 40 MG tablet  Commonly known as:  ZOCOR  Take 40 mg by mouth at  bedtime.     solifenacin 5 MG tablet  Commonly known as:  VESICARE  Take 5 mg by mouth daily.     tamsulosin 0.4 MG Caps capsule  Commonly known as:  FLOMAX  Take 0.4 mg by mouth daily.        ALLERGIES:   Allergies  Allergen Reactions  . Ativan [Lorazepam] Other (See Comments)    Very lethargic  . Benadryl [Diphenhydramine Hcl] Other (See Comments)    Very lethargic    BRIEF HPI:  See H&P, Labs, Consult and Test reports for all details in brief,Patient is a 79 year old male with history of diabetes, hypertension, CAD, bilateral BKA was brought to the ER after he was found on the floor by his bedside, garbled speech, right upper arm weakness.Initial CT of the head on 5/11 showed infarct in the left frontal and right posterior watershed lesions  CONSULTATIONS:   cardiology and neurology  PERTINENT RADIOLOGIC STUDIES: Ct Angio Head W/cm &/or Wo Cm  12/23/2013   CLINICAL DATA:  Stroke. Confusion and aphasia. Right-sided weakness.  EXAM: CT ANGIOGRAPHY HEAD AND NECK  TECHNIQUE: Multidetector CT  imaging of the head and neck was performed using the standard protocol during bolus administration of intravenous contrast. Multiplanar CT image reconstructions and MIPs were obtained to evaluate the vascular anatomy. Carotid stenosis measurements (when applicable) are obtained utilizing NASCET criteria, using the distal internal carotid diameter as the denominator.  CONTRAST:  32mL OMNIPAQUE IOHEXOL 350 MG/ML SOLN  COMPARISON:  None.  CT head 12/20/2013  FINDINGS: CTA HEAD FINDINGS  Ill-defined hypodensity in the left frontal parietal lobe suspicious for subacute infarct. There are scattered small areas of lower density in the left frontal parietal lobe over the convexity consistent with infarction. These were not present on 04/15/2013 but were present on the admitting CT of 12/16/2013. This infarct is difficult to date with certainty but I suspected is subacute. Generalized atrophy. Chronic infarct in the left occipital lobe. Prior burr hole drainage of a subdural hematoma on the right. Mild low-density extra-axial fluid collection on the right is unchanged. This shows some dural enhancement indicative of chronic subdural hematoma and prior surgery. No acute hemorrhage identified. Postcontrast imaging reveals no enhancing mass lesion.  Left internal carotid artery is occluded in the neck and reconstitutes in the supra clinoid portion. The cavernous carotid is heavily calcified bilaterally with moderately severe stenosis on the right and occlusion on the left. Right middle cerebral artery is patent with a mild to moderate stenosis in the M1 segment. Both anterior cerebral arteries are patent with mild disease. The left MCA territory is hypoperfused as it is supplied by collateral vessels.  Atherosclerotic calcification in the distal vertebral artery bilaterally. Both vertebral arteries contribute to the basilar. The basilar shows diffuse mild disease. Moderate atherosclerotic disease in the posterior cerebral  arteries bilaterally.  Negative for cerebral aneurysm.  Review of the MIP images confirms the above findings.  CTA NECK FINDINGS  Proximal great vessels are patent. No mass or adenopathy in the neck.  A right carotid: Right common carotid artery widely patent. Mild atherosclerotic disease the carotid bifurcation without significant stenosis. Right internal carotid artery is patent to the skullbase.  Left carotid: Left common carotid artery widely patent. There is atherosclerotic disease of the carotid bifurcation with occlusion of the left internal carotid artery just past the origin. This vessel is occluded through the cavernous segment.  Vertebral arteries: Scattered atherosclerotic calcification is present in the vertebral arteries bilaterally including the origin  the right vertebral artery in the distal vertebral arteries bilaterally. Both vertebral arteries are patent to the basilar.  Review of the MIP images confirms the above findings.  IMPRESSION: Left MCA infarct, suspect this is subacute in duration.  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 There is reconstitution of the left supra clinoid internal carotid artery by collateral vessels. Left MCA territory is patent but hypoperfused.  There is moderate to severe diffuse intracranial atherosclerotic disease.   Electronically Signed   By: Franchot Gallo M.D.   On: 12/23/2013 08:28   Dg Chest 1 View  12/20/2013   CLINICAL DATA:  Cough, hypertension  EXAM: CHEST - 1 VIEW  COMPARISON:  12/16/2013  FINDINGS: Persistent low lung volumes with streaky bibasilar densities compatible with atelectasis. Stable prominent hilar vascularity. No effusion or pneumothorax. Trachea is midline. Degenerative changes noted of the spine.  IMPRESSION: Stable low lung volumes and basilar atelectasis.   Electronically Signed   By: Daryll Brod M.D.   On: 12/20/2013 08:39   Ct Head Wo Contrast  12/20/2013   CLINICAL DATA:  Worsening mental status following  CVA  EXAM: CT HEAD WITHOUT CONTRAST  TECHNIQUE: Contiguous axial images were obtained from the base of the skull through the vertex without intravenous contrast.  COMPARISON:  CT HEAD W/O CM dated 12/16/2013; CT HEAD W/O CM dated 04/15/2013  FINDINGS: Similar findings advanced atrophy and sulcal prominence. Stable sequela of right frontal calvarial burr hole with associated prominence of the extra-axial space about the right frontal lobe and chronic adjacent subdural thickening. No definite acute intraparenchymal or extra-axial hemorrhage.  Asymmetric ill-defined areas of hypoattenuating attenuation within the left frontal and parietal lobe appear grossly unchanged. Ill-defined geographic area of hypoattenuation within the right parietal-occipital lobe (image 20) is also unchanged. Given extensive back parenchymal abnormalities, there is no CT evidence of acute large territory infarct. No intraparenchymal or extra-axial mass or hemorrhage. Unchanged size and configuration of the ventricles and basilar cisterns. No midline shift. Intracranial atherosclerosis. Regional soft tissues appear normal. Post bilateral cataract surgery. No displaced calvarial fracture. Limited visualization of the paranasal sinuses and mastoid air cells appear normal.  IMPRESSION: 1. No acute intracranial process. 2. Similar findings of chronic/subacute watershed infarcts within the right parietal occipital and left frontal parietal lobes without interval change. 3. Stable sequela of prior right frontal lobe subdural hematoma. 4. Stable findings of atrophy and microvascular ischemic disease.   Electronically Signed   By: Sandi Mariscal M.D.   On: 12/20/2013 08:31   Ct Head Wo Contrast  12/16/2013   CLINICAL DATA:  Dysarthria, right facial droop  EXAM: CT HEAD WITHOUT CONTRAST  TECHNIQUE: Contiguous axial images were obtained from the base of the skull through the vertex without intravenous contrast.  COMPARISON:  Prior head CT 04/15/2013   FINDINGS: Resolving heterogeneous extra-axial collection overlying the right frontal convexity consistent with a chronic subdural hematoma/ hygroma. The collection is smaller compared to 04/15/2013. New ill-defined hypoattenuation extending to the cortex in the left frontal watershed region which is an interval finding compared to 04/15/2013. There appears to be some associated encephalomalacia and is favored to be subacute to remote. Similarly, there is a new focus of hypoattenuation in the right posterior watershed region which also has an appearance suggestive of a subacute to remote process. Additionally, there is global cerebral and cerebellar atrophy and sequelae of chronic microvascular ischemic white matter disease. No focal soft tissue or calvarial abnormality. Normal aeration of the mastoid air  cells and paranasal sinuses. Burr hole defect in the right frontal calvarium.  IMPRESSION: 1. No definite acute intracranial abnormality. 2. Hypoattenuation of the left frontal and right posterior watershed regions represents an interval change compared 04/15/2013 consistent with interval ischemic insults, however both regions have an appearance suggestive of subacute to chronic processes. 3. Slight interval improvement in chronic right frontal subdural hematoma/hygroma. 4. Atrophy and chronic microvascular ischemic white matter disease.   Electronically Signed   By: Jacqulynn Cadet M.D.   On: 12/16/2013 09:30   Ct Angio Neck W/cm &/or Wo/cm  12/21/2013   CLINICAL DATA:  Stroke. Confusion and aphasia. Right-sided weakness.  EXAM: CT ANGIOGRAPHY HEAD AND NECK  TECHNIQUE: Multidetector CT imaging of the head and neck was performed using the standard protocol during bolus administration of intravenous contrast. Multiplanar CT image reconstructions and MIPs were obtained to evaluate the vascular anatomy. Carotid stenosis measurements (when applicable) are obtained utilizing NASCET criteria, using the distal internal  carotid diameter as the denominator.  CONTRAST:  42mL OMNIPAQUE IOHEXOL 350 MG/ML SOLN  COMPARISON:  None.  CT head 12/20/2013  FINDINGS: CTA HEAD FINDINGS  Ill-defined hypodensity in the left frontal parietal lobe suspicious for subacute infarct. There are scattered small areas of lower density in the left frontal parietal lobe over the convexity consistent with infarction. These were not present on 04/15/2013 but were present on the admitting CT of 12/16/2013. This infarct is difficult to date with certainty but I suspected is subacute. Generalized atrophy. Chronic infarct in the left occipital lobe. Prior burr hole drainage of a subdural hematoma on the right. Mild low-density extra-axial fluid collection on the right is unchanged. This shows some dural enhancement indicative of chronic subdural hematoma and prior surgery. No acute hemorrhage identified. Postcontrast imaging reveals no enhancing mass lesion.  Left internal carotid artery is occluded in the neck and reconstitutes in the supra clinoid portion. The cavernous carotid is heavily calcified bilaterally with moderately severe stenosis on the right and occlusion on the left. Right middle cerebral artery is patent with a mild to moderate stenosis in the M1 segment. Both anterior cerebral arteries are patent with mild disease. The left MCA territory is hypoperfused as it is supplied by collateral vessels.  Atherosclerotic calcification in the distal vertebral artery bilaterally. Both vertebral arteries contribute to the basilar. The basilar shows diffuse mild disease. Moderate atherosclerotic disease in the posterior cerebral arteries bilaterally.  Negative for cerebral aneurysm.  Review of the MIP images confirms the above findings.  CTA NECK FINDINGS  Proximal great vessels are patent. No mass or adenopathy in the neck.  A right carotid: Right common carotid artery widely patent. Mild atherosclerotic disease the carotid bifurcation without significant  stenosis. Right internal carotid artery is patent to the skullbase.  Left carotid: Left common carotid artery widely patent. There is atherosclerotic disease of the carotid bifurcation with occlusion of the left internal carotid artery just past the origin. This vessel is occluded through the cavernous segment.  Vertebral arteries: Scattered atherosclerotic calcification is present in the vertebral arteries bilaterally including the origin the right vertebral artery in the distal vertebral arteries bilaterally. Both vertebral arteries are patent to the basilar.  Review of the MIP images confirms the above findings.  IMPRESSION: Left MCA infarct, suspect this is subacute in duration.  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 There is reconstitution of the left supra clinoid internal carotid artery by collateral vessels. Left MCA territory is patent but hypoperfused.  There is moderate to severe diffuse intracranial atherosclerotic disease.   Electronically Signed   By: Franchot Gallo M.D.   On: 12/21/2013 14:02   Dg Chest Port 1 View  12/16/2013   CLINICAL DATA:  Right-side weakness. Confusion. Diabetic hypertensive patient. History of testicular cancer.  EXAM: PORTABLE CHEST - 1 VIEW  COMPARISON:  03/04/2013.  FINDINGS: Heart size top-normal.  Mild central pulmonary vascular prominence.  Evaluation lung bases slightly limited.  No segmental consolidation.  No plain film evidence of pulmonary malignancy detected on this decreased inspiratory portable exam. Patient would eventually benefit from followup two view chest.  Acromioclavicular joint degenerative changes.  IMPRESSION: Mild central pulmonary vascular prominence and top-normal heart size. Please see above.   Electronically Signed   By: Chauncey Cruel M.D.   On: 12/16/2013 17:28     PERTINENT LAB RESULTS: CBC: No results found for this basename: WBC, HGB, HCT, PLT,  in the last 72 hours CMET CMP     Component Value Date/Time   NA  140 12/20/2013 0915   NA 138 02/12/2013 0955   NA 142 01/26/2012 0931   K 4.6 12/20/2013 0915   K 3.8 02/12/2013 0955   K 5.5* 01/26/2012 0931   CL 107 12/20/2013 0915   CL 107 08/17/2012 1015   CL 102 01/26/2012 0931   CO2 20 12/20/2013 0915   CO2 26 02/12/2013 0955   CO2 29 01/26/2012 0931   GLUCOSE 180* 12/20/2013 0915   GLUCOSE 212* 02/12/2013 0955   GLUCOSE 139* 08/17/2012 1015   GLUCOSE 142* 01/26/2012 0931   BUN 25* 12/20/2013 0915   BUN 14.6 02/12/2013 0955   BUN 14 01/26/2012 0931   CREATININE 1.08 12/20/2013 0915   CREATININE 1.01 04/25/2013 1353   CREATININE 1.1 02/12/2013 0955   CALCIUM 9.0 12/20/2013 0915   CALCIUM 8.3* 02/12/2013 0955   CALCIUM 9.1 01/26/2012 0931   PROT 6.9 12/16/2013 0848   PROT 6.2* 02/12/2013 0955   PROT 7.7 01/26/2012 0931   ALBUMIN 3.0* 12/16/2013 0848   ALBUMIN 2.7* 02/12/2013 0955   AST 11 12/16/2013 0848   AST 9 02/12/2013 0955   AST 17 01/26/2012 0931   ALT 8 12/16/2013 0848   ALT 8 02/12/2013 0955   ALT 11 01/26/2012 0931   ALKPHOS 113 12/16/2013 0848   ALKPHOS 105 02/12/2013 0955   ALKPHOS 112* 01/26/2012 0931   BILITOT 0.5 12/16/2013 0848   BILITOT 0.34 02/12/2013 0955   BILITOT 0.60 01/26/2012 0931   GFRNONAA 64* 12/20/2013 0915   GFRAA 74* 12/20/2013 0915    GFR Estimated Creatinine Clearance: 67.5 ml/min (by C-G formula based on Cr of 1.08). No results found for this basename: LIPASE, AMYLASE,  in the last 72 hours No results found for this basename: CKTOTAL, CKMB, CKMBINDEX, TROPONINI,  in the last 72 hours No components found with this basename: POCBNP,  No results found for this basename: DDIMER,  in the last 72 hours No results found for this basename: HGBA1C,  in the last 72 hours No results found for this basename: CHOL, HDL, LDLCALC, TRIG, CHOLHDL, LDLDIRECT,  in the last 72 hours No results found for this basename: TSH, T4TOTAL, FREET3, T3FREE, THYROIDAB,  in the last 72 hours No results found for this basename: VITAMINB12, FOLATE, FERRITIN, TIBC, IRON,  RETICCTPCT,  in the last 72 hours Coags: No results found for this basename: PT, INR,  in the last 72 hours Microbiology: No results found for this or any previous visit (from the  past 240 hour(s)).   BRIEF HOSPITAL COURSE:   Principal Problem: Subacute CVA (cerebral infarction)  - Patient was admitted on 12/16/13 after he was found by his granddaughter with garbled speech and significant upper extremity weakness. Per family, they have been noticing that the patient's right upper extremity has been weak for a few weeks prior to this admission. Initial CT of the head on 5/11 showed infarct in the left frontal and right posterior watershed lesions.MRI could not be done attempted x3 due to patient non cooperation. On admission neurology was consulted, patient underwent further workup which included a carotid Doppler which showed 1-39% right ICA stenosis, left ICA was occluded. He also underwent a transthoracic echocardiogram on 5/13, which showed an EF around 50%. Given the fact that patient had multiple areas of infarction, likely cardioembolic, he underwent transesophageal echocardiogram on 5/15 which showed global hypokinesis with an EF around 25-30%, it also showed smoked in the left atrial cavity. Neurology subsequently recommended patient be started on Eliquis. He is to have significant weakness in his right upper/lower extremity. Telemetry shows normal sinus rhythm with no evidence of A. Fib/flutter.Patient was subsequently seen by Stroke team/Dr Leonie Man on 5/18 who recommended to stop Eliquis and place on ASA/Plavix as there was no evidence of benefit of anticoagulation in absence of Atrial fibrillation.Initial plans were to implant a loop recorder, however seen by EP-Dr Allred who recomends a 30 day event monitor first, which will be arranged by cardiology.Please see below for further details regarding cardiomyopathy  - Further workup revealed LDL 40, and hemoglobin A1c 7.4.  - Current plans are to  discharge to skilled nursing facility once workup is complete.  Chronic systolic heart failure  - Since patient had a CVA, patient underwent transthoracic echocardiogram which showed preserved ejection fraction, however since the suspicion for cardioembolic CVA was high, patient then underwent a transesophageal echocardiogram which showed a significantly low EF around 25-30%. Since there is a big discrepancy between the 2 studies, cardiology has been consulted, patient was briefly started on Eliquis given smoke seen on the left atrium on TEE. Repeat TTE on 5/15 confirms low EF. Seen by Dr Leonie Man on 5/18 who recommended to stop Eliquis and start ASA/Plavix, as there was no benefit for anticoagulation in this setting without Afib. Please see above regarding anticoagulation.  - Clinically compensated, being managed with low-dose lisinopril and Coreg  H/O SDH-July 2014  - Stable-difficult situation in his head significant falls in the past with subdural hematoma requiring evacuation in 2014.   Hx of Seizures  -c/w Keppra-stable   Diabetes  - CBGs stable, continue with Lantus and SSI.  -A1c 7.4.   History of CAD  - No acute issues, not on antiplatelet agents-as on Eliquis. Continue statins and beta blockers.   HYPERLIPIDEMIA  - Continue statins and Zetia  - LDL 40   HYPERTENSION  - Moderate controlled with lisinopril and Coreg   S/P bilateral BKA (below knee amputation)  TODAY-DAY OF DISCHARGE:  Subjective:   Serafin [place] today has no headache,no chest abdominal pain,no new weakness tingling or numbness, feels much better wants to go home today.   Objective:   Blood pressure 105/53, pulse 70, temperature 98.1 F (36.7 C), temperature source Oral, resp. rate 20, height 5\' 11"  (1.803 m), weight 98.476 kg (217 lb 1.6 oz), SpO2 98.00%.  Intake/Output Summary (Last 24 hours) at 12/24/13 1639 Last data filed at 12/24/13 1353  Gross per 24 hour  Intake    720 ml  Output    350 ml  Net     370 ml   Filed Weights   12/22/13 1300  Weight: 98.476 kg (217 lb 1.6 oz)    Exam Awake Alert, Oriented *3, No new F.N deficits, Normal affect [place].AT,PERRAL Supple Neck,No JVD, No cervical lymphadenopathy appriciated.  Symmetrical Chest wall movement, Good air movement bilaterally, CTAB RRR,No Gallops,Rubs or new Murmurs, No Parasternal Heave +ve B.Sounds, Abd Soft, Non tender, No organomegaly appriciated, No rebound -guarding or rigidity. No Cyanosis, Clubbing or edema, No new Rash or bruise  DISCHARGE CONDITION: Stable  DISPOSITION: SNF  DISCHARGE INSTRUCTIONS:    Activity:  As tolerated with Full fall precautions use walker/cane & assistance as needed  Diet recommendation: Diabetic Diet Heart Healthy diet Fluid restriction 1.5 lit/day Aspiration precautions:yes/No      Discharge Instructions   (HEART FAILURE PATIENTS) Call MD:  Anytime you have any of the following symptoms: 1) 3 pound weight gain in 24 hours or 5 pounds in 1 week 2) shortness of breath, with or without a dry hacking cough 3) swelling in the hands, feet or stomach 4) if you have to sleep on extra pillows at night in order to breathe.    Complete by:  As directed      Diet - low sodium heart healthy    Complete by:  As directed      Diet Carb Modified    Complete by:  As directed      Increase activity slowly    Complete by:  As directed            Follow-up Information   Follow up with Xu,Jindong, MD In 2 months. ([facility] - 01/13/14 at 2pm, Stroke Clinic)    Specialty:  Neurology   Contact information:   [address] [place] [address] [phone]       Follow up with Forbes Cellar, MD. Schedule an appointment as soon as possible for a visit in 2 months.   Specialties:  Neurology, Radiology   Contact information:   [address] [place] [address] [phone]       Follow up with Richardson Dopp, PA-C On 01/13/2014. (appt at 2pm)      Specialty:  Physician Assistant   Contact information:   [address] [place] [address] [phone]         Total Time spent on discharge equals 45 minutes.  Signed: Jonetta Osgood 12/24/2013 4:39 PM  **Disclaimer: This note may have been dictated with voice recognition software. Similar sounding words can inadvertently be transcribed and this note may contain transcription errors which may not have been corrected upon publication of note.**

## 2013-12-24 NOTE — Progress Notes (Signed)
Stroke Team Progress Note  HISTORY [name] is a 80 y.o. male with a history of a right SDH S/P burr hole for evacuation for a large chronic subdural hematoma with marked mass effect. Patient also has history of seizures which he currently is on Keppra 500 mg BID. Per wife he is wheel chair bound, has recently shown increased memory decline, able to feed himself but otherwise needs assistance with all ADL's. He has a home health CNA which cares for him.  Over the past month she has noted he does not use his right arm and allows it to hang over his wheel chair. In addition she has noted over the last few days he has had less vision in the right field. On 12/15/2013 at 1600 he was noted to have increased slurred speech but this improved over the night. On the morning of admission, 12/16/2013, he was found on the floor next to his bed and confused by granddaughter. It is not clear how he arrived on the floor. He has no bruises or hematomas. He complains of no pain. Per wife he continues to sound more slurred than usual. Patient was not a TPA candidate secondary to delay in arrival.  SUBJECTIVE No family members present. The patient is without complaints.  OBJECTIVE Most recent Vital Signs: Filed Vitals:   12/23/13 1846 12/23/13 2214 12/24/13 0132 12/24/13 0516  BP: 133/77 127/60 122/59 138/60  Pulse: 63 74 69 70  Temp: 97.9 F (36.6 C) 98.6 F (37 C) 98.7 F (37.1 C) 98.3 F (36.8 C)  TempSrc: Oral Oral Oral Oral  Resp: 20 16 18 18   Height:      Weight:      SpO2: 97% 93% 92% 92%   CBG (last 3)   Recent Labs  12/23/13 1658 12/23/13 2212 12/24/13 0651  GLUCAP 184* 151* 114*    IV Fluid Intake:      MEDICATIONS  . aspirin EC  81 mg Oral Daily  . carvedilol  3.125 mg Oral BID WC  . clopidogrel  75 mg Oral Q breakfast  . darifenacin  7.5 mg Oral Daily  . ezetimibe  10 mg Oral Daily  . insulin aspart  0-5 Units Subcutaneous QHS  . insulin aspart  0-9 Units Subcutaneous TID  WC  . insulin glargine  8 Units Subcutaneous Q breakfast  . levETIRAcetam  500 mg Oral BID  . lisinopril  2.5 mg Oral Daily  . simvastatin  40 mg Oral QHS  . tamsulosin  0.4 mg Oral Daily   PRN:  acetaminophen, acetaminophen, haloperidol lactate, senna-docusate  Diet:  Carb Control thin liquids Activity:  Up with assistance DVT Prophylaxis:  Apixaban  CLINICALLY SIGNIFICANT STUDIES Basic Metabolic Panel:   Recent Labs Lab 12/20/13 0915  NA 140  K 4.6  CL 107  CO2 20  GLUCOSE 180*  BUN 25*  CREATININE 1.08  CALCIUM 9.0   Liver Function Tests:  No results found for this basename: AST, ALT, ALKPHOS, BILITOT, PROT, ALBUMIN,  in the last 168 hours CBC:   Recent Labs Lab 12/20/13 0915  WBC 7.0  HGB 13.2  HCT 39.8  MCV 95.4  PLT 208   Coagulation: No results found for this basename: LABPROT, INR,  in the last 168 hours Cardiac Enzymes:   Recent Labs Lab 12/20/13 1702 12/21/13 0440 12/21/13 1550  TROPONINI <0.30 <0.30 <0.30   Urinalysis:  No results found for this basename: COLORURINE, APPERANCEUR, LABSPEC, PHURINE, GLUCOSEU, HGBUR, BILIRUBINUR, KETONESUR, PROTEINUR, UROBILINOGEN, NITRITE,  LEUKOCYTESUR,  in the last 168 hours Lipid Panel    Component Value Date/Time   CHOL 92 12/17/2013 0600   TRIG 61 12/17/2013 0600   HDL 40 12/17/2013 0600   CHOLHDL 2.3 12/17/2013 0600   VLDL 12 12/17/2013 0600   LDLCALC 40 12/17/2013 0600   HgbA1C  Lab Results  Component Value Date   HGBA1C 7.4* 12/17/2013    Urine Drug Screen:   No results found for this basename: labopia,  cocainscrnur,  labbenz,  amphetmu,  thcu,  labbarb    Alcohol Level: No results found for this basename: ETH,  in the last 168 hours  Ct Head Wo Contrast 12/20/2013    1. No acute intracranial process.  2. Similar findings of chronic/subacute watershed infarcts within the right parietal occipital and left frontal parietal lobes without interval change.  3. Stable sequela of prior right frontal lobe  subdural hematoma.  4. Stable findings of atrophy and microvascular ischemic disease.    12/17/2103 1. No definite acute intracranial abnormality.  2. Hypoattenuation of the left frontal and right posterior watershed regions represents an interval change compared 04/15/2013 consistent with interval ischemic insults, however both regions have an appearance suggestive of subacute to chronic processes.  3. Slight interval improvement in chronic right frontal subdural hematoma/hygroma.  4. Atrophy and chronic microvascular ischemic white matter disease.  Ct Angio Neck W/cm &/or Wo/cm 12/21/2013    Left MCA infarct, suspect this is subacute in duration.  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 There is reconstitution of the left supra clinoid internal carotid artery by collateral vessels. Left MCA territory is patent but hypoperfused.  There is moderate to severe diffuse intracranial atherosclerotic disease.     MRI/A of the brain  Patient unable to tolerate.  2D Echocardiogram   12/20/2013 ejection fraction 25-30%  12/18/2013 ejection fraction 50%. No cardiac source of emboli identified.  TEE - 12/20/2013 - Left ventricle: Global hypokinesis; akinetic inferior lateral wall Systolic function was severely reduced. The estimated ejection fraction was in the range of 25% to 30%. - Aortic valve: Trivial regurgitation. - Mitral valve: No evidence of vegetation. - Left atrium: The atrium was moderately to severely dilated.    No evidence of thrombus in the atrial cavity or appendage. There was mild spontaneous echo contrast ("smoke") in the cavity. - Atrial septum: Atrial septal aneurysm  No defect or patent foramen ovale was identified. - Tricuspid valve: No evidence of vegetation. - Pulmonic valve: No evidence of vegetation. Impressions: - Negative saline microcavitation study.  Carotid Doppler   1-39% right ICA stenosis. Left ICA occluded. Right vertebral artery flow antegrade.  Left vertebral artery not insonate.   CXR   12/20/2013    Stable low lung volumes and basilar atelectasis.   EKG  normal sinus rhythm  Therapy Recommendations SNF  Physical Exam General: The patient is alert and cooperative at the time of the examination. Skin: Bilateral BKA Neurologic Exam Mental status: The patient is alert. Cranial nerves: Facial symmetry is present.  Somewhat aphasic and dysarthric. He will repeat and name objects, he will not follow verbal commands. Extraocular movements are full. Visual fields are notable for a right HH. Motor: The patient has good strength in the left upper extremity. Right arm is almost plegic, bilateral BKA. Sensory examination: Patient seems to respond to pain sensation on all 4 extremities. Coordination: The patient has inability to follow commands for cerebellar testing. Gait and station: The gait could not be tested.  Reflexes: Deep tendon reflexes are symmetric, decreased.  ASSESSMENT Mr. [name] is a 80 y.o. male presenting with confusion and aphasia. Imaging confirms subacute left MCA infarct and chronic/subacute right parietal occipital infarct. L MCA infarct associated with L ICA occlusion. This infarct felt to be embolic secondary to unknown source, most likely atrial fibrillation. Tele thus far negative for atrial fibrillation.  On no antithrombotic prior to admission. On Eliquis now.   Low EF 25-30% by TEE atrial smoke (TTE - EF 50%)  DM  HTN  HLD, on zocor   CAD  PVD - bilateral BKAs  Diabetic PN  Left internal carotid artery occlusion   Seizure disorder - Keppra  Patient active with [facility] prior to admission  Hospital day # 8  TREATMENT/PLAN   Change Apixaban to aspirin and plavix x 3 months then plavix alone. No confirmed indication for anticoagulation from stroke standpoint.  Dr. Leonie Man recommends placement of an implantable loop recorder to evaluate for atrial fibrillation as etiology of stroke. This has  been explained to patient/family by Dr. Leonie Man and they are agreeable. Cardiology aware.  Dispo:  SNF  Stroke team will sign off. Please call for further assistance as needed.  Followup Dr. Leonie Man in 2 months.    Mikey Bussing PA-C [facility] Pager [phone] 12/24/2013, 8:21 AM  I have personally obtained a history, examined the patient, evaluated imaging results, and formulated the assessment and plan of care. I agree with the above.  Jim Like, DO [facility] Pager: [phone]

## 2013-12-24 NOTE — Progress Notes (Signed)
PATIENT DETAILS Name: [name] Age: 80 y.o. Sex: male Date of Birth: Dec 25, 1932 Admit Date: 12/16/2013 Admitting Physician Ripudeep Krystal Eaton, MD [id number], Gwyndolyn Saxon, MD  Subjective: No complaints this am  Assessment/Plan: Principal Problem:  Subacute CVA (cerebral infarction) - Patient was admitted on 12/16/13 after he was found by his granddaughter with garbled speech and significant upper extremity weakness. Per family, they have been noticing that the patient's right upper extremity has been weak for a few weeks prior to this admission. Initial CT of the head on 5/11 showed infarct in the left frontal and right posterior watershed lesions.MRI could not be done attempted x3 due to patient non cooperation. On admission neurology was consulted, patient underwent further workup which included a carotid Doppler which showed 1-39% right ICA stenosis, left ICA was occluded. He also underwent a transthoracic echocardiogram on 5/13, which showed an EF around 50%. Given the fact that patient had multiple areas of infarction, likely cardioembolic, he underwent transesophageal echocardiogram on 5/15 which showed global hypokinesis with an EF around 25-30%, it also showed smoked in the left atrial cavity. Neurology subsequently recommended patient be started on Eliquis. He is to have significant weakness in his right upper/lower extremity. Telemetry shows normal sinus rhythm with no evidence of A. Fib/flutter.Patient was subsequently seen by Stroke team/Dr Leonie Man on 5/18 who recommended to stop Eliquis and place on ASA/Plavix as there was no evidence of benefit of anticoagulation in absence of AfIB.Please see below for further details regarding cardiomyopathy - Further workup revealed LDL 40, and hemoglobin A1c 7.4. - Current plans are to discharge to skilled nursing facility once workup is complete.  Chronic systolic heart failure - Since patient had a CVA, patient underwent transthoracic  echocardiogram which showed preserved ejection fraction, however since the suspicion for cardioembolic CVA was high, patient then underwent a transesophageal echocardiogram which showed a significantly low EF around 25-30%. Since there is a big discrepancy between the 2 studies, cardiology has been consulted, patient remains on Eliquis given smoke seen on the left atrium on TEE. Repeat TTE on 5/15 confirms low EF. Please see above regarding anticoagulation. - Clinically compensated, being managed with low-dose lisinopril and Coreg.  H/O SDH-July 2014 - Stable-difficult situation in his head significant falls in the past with subdural hematoma requiring evacuation in 2014.   Hx of Seizures -c/w Keppra-stable  Diabetes - CBGs stable, continue with Lantus and SSI. -A1c 7.4.  History of CAD - No acute issues, not on antiplatelet agents-as on Eliquis. Continue statins and beta blockers.  HYPERLIPIDEMIA  - Continue statins and Zetia - LDL 40  HYPERTENSION  - Moderate controlled with lisinopril and Coreg  S/P bilateral BKA (below knee amputation)  Disposition: Remain inpatient-SNF on discharge-await loop recorder implantation.  DVT Prophylaxis: Heparin SQ  Code Status: Full code   Family Communication Son over the phone, spouse at bedside on 5/19  Procedures:  TEE-5/15  CONSULTS:  cardiology and neurology  MEDICATIONS: Scheduled Meds: . aspirin EC  81 mg Oral Daily  . carvedilol  3.125 mg Oral BID WC  . clopidogrel  75 mg Oral Q breakfast  . darifenacin  7.5 mg Oral Daily  . ezetimibe  10 mg Oral Daily  . insulin aspart  0-5 Units Subcutaneous QHS  . insulin aspart  0-9 Units Subcutaneous TID WC  . insulin glargine  8 Units Subcutaneous Q breakfast  . levETIRAcetam  500 mg Oral BID  . lisinopril  2.5 mg Oral  Daily  . simvastatin  40 mg Oral QHS  . tamsulosin  0.4 mg Oral Daily   Continuous Infusions:   PRN Meds:.acetaminophen, acetaminophen, haloperidol lactate,  senna-docusate  Antibiotics: Anti-infectives   None       PHYSICAL EXAM: Vital signs in last 24 hours: Filed Vitals:   12/23/13 1846 12/23/13 2214 12/24/13 0132 12/24/13 0516  BP: 133/77 127/60 122/59 138/60  Pulse: 63 74 69 70  Temp: 97.9 F (36.6 C) 98.6 F (37 C) 98.7 F (37.1 C) 98.3 F (36.8 C)  TempSrc: Oral Oral Oral Oral  Resp: 20 16 18 18   Height:      Weight:      SpO2: 97% 93% 92% 92%    Weight change:  Filed Weights   12/22/13 1300  Weight: 98.476 kg (217 lb 1.6 oz)   Body mass index is 30.29 kg/(m^2).   Gen Exam: Awake and alert  Neck: Supple, No JVD.   Chest: B/L Clear. Abdomen: soft, BS +, non tender, non distended.  Extremities: b/l Bka Neurologic: Still with significant right upper ext weakness Skin: No Rash.   Wounds: N/A.  Intake/Output from previous day:  Intake/Output Summary (Last 24 hours) at 12/24/13 0848 Last data filed at 12/23/13 1700  Gross per 24 hour  Intake    480 ml  Output   1000 ml  Net   -520 ml     LAB RESULTS: CBC  Recent Labs Lab 12/20/13 0915  WBC 7.0  HGB 13.2  HCT 39.8  PLT 208  MCV 95.4  MCH 31.7  MCHC 33.2  RDW 14.2    Chemistries   Recent Labs Lab 12/20/13 0915  NA 140  K 4.6  CL 107  CO2 20  GLUCOSE 180*  BUN 25*  CREATININE 1.08  CALCIUM 9.0    CBG:  Recent Labs Lab 12/23/13 0649 12/23/13 1141 12/23/13 1658 12/23/13 2212 12/24/13 0651  GLUCAP 144* 206* 184* 151* 114*    GFR Estimated Creatinine Clearance: 67.5 ml/min (by C-G formula based on Cr of 1.08).  Coagulation profile No results found for this basename: INR, PROTIME,  in the last 168 hours  Cardiac Enzymes  Recent Labs Lab 12/20/13 1702 12/21/13 0440 12/21/13 1550  TROPONINI <0.30 <0.30 <0.30    No components found with this basename: POCBNP,  No results found for this basename: DDIMER,  in the last 72 hours No results found for this basename: HGBA1C,  in the last 72 hours No results found for this  basename: CHOL, HDL, LDLCALC, TRIG, CHOLHDL, LDLDIRECT,  in the last 72 hours No results found for this basename: TSH, T4TOTAL, FREET3, T3FREE, THYROIDAB,  in the last 72 hours No results found for this basename: VITAMINB12, FOLATE, FERRITIN, TIBC, IRON, RETICCTPCT,  in the last 72 hours No results found for this basename: LIPASE, AMYLASE,  in the last 72 hours  Urine Studies No results found for this basename: UACOL, UAPR, USPG, UPH, UTP, UGL, UKET, UBIL, UHGB, UNIT, UROB, ULEU, UEPI, UWBC, URBC, UBAC, CAST, CRYS, UCOM, BILUA,  in the last 72 hours  MICROBIOLOGY: No results found for this or any previous visit (from the past 240 hour(s)).  RADIOLOGY STUDIES/RESULTS: Dg Chest 1 View  12/20/2013   CLINICAL DATA:  Cough, hypertension  EXAM: CHEST - 1 VIEW  COMPARISON:  12/16/2013  FINDINGS: Persistent low lung volumes with streaky bibasilar densities compatible with atelectasis. Stable prominent hilar vascularity. No effusion or pneumothorax. Trachea is midline. Degenerative changes noted of the spine.  IMPRESSION: Stable low lung volumes and basilar atelectasis.   Electronically Signed   By: Daryll Brod M.D.   On: 12/20/2013 08:39   Ct Head Wo Contrast  12/20/2013   CLINICAL DATA:  Worsening mental status following CVA  EXAM: CT HEAD WITHOUT CONTRAST  TECHNIQUE: Contiguous axial images were obtained from the base of the skull through the vertex without intravenous contrast.  COMPARISON:  CT HEAD W/O CM dated 12/16/2013; CT HEAD W/O CM dated 04/15/2013  FINDINGS: Similar findings advanced atrophy and sulcal prominence. Stable sequela of right frontal calvarial burr hole with associated prominence of the extra-axial space about the right frontal lobe and chronic adjacent subdural thickening. No definite acute intraparenchymal or extra-axial hemorrhage.  Asymmetric ill-defined areas of hypoattenuating attenuation within the left frontal and parietal lobe appear grossly unchanged. Ill-defined geographic  area of hypoattenuation within the right parietal-occipital lobe (image 20) is also unchanged. Given extensive back parenchymal abnormalities, there is no CT evidence of acute large territory infarct. No intraparenchymal or extra-axial mass or hemorrhage. Unchanged size and configuration of the ventricles and basilar cisterns. No midline shift. Intracranial atherosclerosis. Regional soft tissues appear normal. Post bilateral cataract surgery. No displaced calvarial fracture. Limited visualization of the paranasal sinuses and mastoid air cells appear normal.  IMPRESSION: 1. No acute intracranial process. 2. Similar findings of chronic/subacute watershed infarcts within the right parietal occipital and left frontal parietal lobes without interval change. 3. Stable sequela of prior right frontal lobe subdural hematoma. 4. Stable findings of atrophy and microvascular ischemic disease.   Electronically Signed   By: Sandi Mariscal M.D.   On: 12/20/2013 08:31   Ct Head Wo Contrast  12/16/2013   CLINICAL DATA:  Dysarthria, right facial droop  EXAM: CT HEAD WITHOUT CONTRAST  TECHNIQUE: Contiguous axial images were obtained from the base of the skull through the vertex without intravenous contrast.  COMPARISON:  Prior head CT 04/15/2013  FINDINGS: Resolving heterogeneous extra-axial collection overlying the right frontal convexity consistent with a chronic subdural hematoma/ hygroma. The collection is smaller compared to 04/15/2013. New ill-defined hypoattenuation extending to the cortex in the left frontal watershed region which is an interval finding compared to 04/15/2013. There appears to be some associated encephalomalacia and is favored to be subacute to remote. Similarly, there is a new focus of hypoattenuation in the right posterior watershed region which also has an appearance suggestive of a subacute to remote process. Additionally, there is global cerebral and cerebellar atrophy and sequelae of chronic  microvascular ischemic white matter disease. No focal soft tissue or calvarial abnormality. Normal aeration of the mastoid air cells and paranasal sinuses. Burr hole defect in the right frontal calvarium.  IMPRESSION: 1. No definite acute intracranial abnormality. 2. Hypoattenuation of the left frontal and right posterior watershed regions represents an interval change compared 04/15/2013 consistent with interval ischemic insults, however both regions have an appearance suggestive of subacute to chronic processes. 3. Slight interval improvement in chronic right frontal subdural hematoma/hygroma. 4. Atrophy and chronic microvascular ischemic white matter disease.   Electronically Signed   By: Jacqulynn Cadet M.D.   On: 12/16/2013 09:30   Ct Angio Neck W/cm &/or Wo/cm  12/21/2013   CLINICAL DATA:  Stroke. Confusion and aphasia. Right-sided weakness.  EXAM: CT ANGIOGRAPHY HEAD AND NECK  TECHNIQUE: Multidetector CT imaging of the head and neck was performed using the standard protocol during bolus administration of intravenous contrast. Multiplanar CT image reconstructions and MIPs were obtained to evaluate the vascular anatomy. Carotid  stenosis measurements (when applicable) are obtained utilizing NASCET criteria, using the distal internal carotid diameter as the denominator.  CONTRAST:  66mL OMNIPAQUE IOHEXOL 350 MG/ML SOLN  COMPARISON:  None.  CT head 12/20/2013  FINDINGS: CTA HEAD FINDINGS  Ill-defined hypodensity in the left frontal parietal lobe suspicious for subacute infarct. There are scattered small areas of lower density in the left frontal parietal lobe over the convexity consistent with infarction. These were not present on 04/15/2013 but were present on the admitting CT of 12/16/2013. This infarct is difficult to date with certainty but I suspected is subacute. Generalized atrophy. Chronic infarct in the left occipital lobe. Prior burr hole drainage of a subdural hematoma on the right. Mild low-density  extra-axial fluid collection on the right is unchanged. This shows some dural enhancement indicative of chronic subdural hematoma and prior surgery. No acute hemorrhage identified. Postcontrast imaging reveals no enhancing mass lesion.  Left internal carotid artery is occluded in the neck and reconstitutes in the supra clinoid portion. The cavernous carotid is heavily calcified bilaterally with moderately severe stenosis on the right and occlusion on the left. Right middle cerebral artery is patent with a mild to moderate stenosis in the M1 segment. Both anterior cerebral arteries are patent with mild disease. The left MCA territory is hypoperfused as it is supplied by collateral vessels.  Atherosclerotic calcification in the distal vertebral artery bilaterally. Both vertebral arteries contribute to the basilar. The basilar shows diffuse mild disease. Moderate atherosclerotic disease in the posterior cerebral arteries bilaterally.  Negative for cerebral aneurysm.  Review of the MIP images confirms the above findings.  CTA NECK FINDINGS  Proximal great vessels are patent. No mass or adenopathy in the neck.  A right carotid: Right common carotid artery widely patent. Mild atherosclerotic disease the carotid bifurcation without significant stenosis. Right internal carotid artery is patent to the skullbase.  Left carotid: Left common carotid artery widely patent. There is atherosclerotic disease of the carotid bifurcation with occlusion of the left internal carotid artery just past the origin. This vessel is occluded through the cavernous segment.  Vertebral arteries: Scattered atherosclerotic calcification is present in the vertebral arteries bilaterally including the origin the right vertebral artery in the distal vertebral arteries bilaterally. Both vertebral arteries are patent to the basilar.  Review of the MIP images confirms the above findings.  IMPRESSION: Left MCA infarct, suspect this is subacute in duration.   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 There is reconstitution of the left supra clinoid internal carotid artery by collateral vessels. Left MCA territory is patent but hypoperfused.  There is moderate to severe diffuse intracranial atherosclerotic disease.   Electronically Signed   By: Franchot Gallo M.D.   On: 12/21/2013 14:02   Dg Chest Port 1 View  12/16/2013   CLINICAL DATA:  Right-side weakness. Confusion. Diabetic hypertensive patient. History of testicular cancer.  EXAM: PORTABLE CHEST - 1 VIEW  COMPARISON:  03/04/2013.  FINDINGS: Heart size top-normal.  Mild central pulmonary vascular prominence.  Evaluation lung bases slightly limited.  No segmental consolidation.  No plain film evidence of pulmonary malignancy detected on this decreased inspiratory portable exam. Patient would eventually benefit from followup two view chest.  Acromioclavicular joint degenerative changes.  IMPRESSION: Mild central pulmonary vascular prominence and top-normal heart size. Please see above.   Electronically Signed   By: Chauncey Cruel M.D.   On: 12/16/2013 17:28    [place], MD  [facility] Pager:[phone]  If 7PM-7AM, please contact night-coverage [email or web address] Password [email or web address] 12/24/2013, 8:48 AM   LOS: 8 days   **Disclaimer: This note may have been dictated with voice recognition software. Similar sounding words can inadvertently be transcribed and this note may contain transcription errors which may not have been corrected upon publication of note.**

## 2013-12-24 NOTE — Clinical Social Work Note (Addendum)
Clinical Social Worker continuing to follow patient and family for support and discharge planning needs.  CSW spoke with CM who states that patient must have loop recorded implanted prior to discharge to SNF.  CSW spoke with facility liaison who states that patient is able to come at any time today if medically ready.  CSW to facilitate patient discharge to [facility] once medically ready.  Barbette Or, LCSW [phone]  (Covering Cassandra Lantry, [place])  15:15 CSW spoke with patient wife at bedside who is agreeable with SNF placement at [facility].  Patient wife plans to ride with patient on ambulance and requests transport after lunch - CSW to update covering CSW tomorrow.

## 2013-12-24 NOTE — Consult Note (Signed)
ELECTROPHYSIOLOGY CONSULT NOTE    Patient ID: [name] MRN: [id number], DOB/AGE: 78/22/1934 78 y.o.  Admit date: 12/16/2013 Date of Consult: 12-24-2013  Primary Physician: Marton Redwood, MD Primary Cardiologist: previously Pillager - no recent cardiology follow-up  Reason for Consultation: cryptogenic stroke, recommendations for implantable loop recorder  HPI:  [name] is a 80 y.o. male with a past medical history of ischemic cardiomyopathy, bilateral BKA, diabetes, hypertension, prior right SDH s/p bur hole evacuation and PVD.  He was admitted 12-16-13 with right arm weakness, vision changes, and slurred speech.  On the day of admission he was found on the floor next to his bed by his granddaughter.  He denies frank syncope and thinks that he fell.  Imaging demonstrated subacute bihemispheric CVA.  Vascular imaging this admission has demonstrated occluded left ICA.  He has a long standing known cardiomyopathy with EF 20-30%.    He endorses frequent palpitations.  He denies frank syncope, chest pain, shortness of breath.  He is wheelchair bound but was fairly independent prior to admission.  Plans are to go to SNF at discharge.  TEE this admission demonstrated EF 25-30%, akinetic inferior lateral wall, LA moderately to severely dilated, smoke in left atrial cavity.    EP has been asked to evaluate for treatment options.   ROS is negative except as outlined above.   Past Medical History  Diagnosis Date  . Diabetes mellitus   . Hypertension   . Testicular cancer dx'd 08/2010    surg only  . CHF (congestive heart failure)   . CAD (coronary artery disease)     s/p stent to OM1 remotely, DES to mRCA 2006  . Peripheral vascular disease   . Vitamin B12 deficiency   . Diabetic retinopathy   . Diabetic peripheral neuropathy   . Cholelithiasis   . Family history of colon cancer     brother   . 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 Osteomyelitis of finger of right hand 03/2013   R middle finger  . Pressure ulcer of buttock 03/2013    stage 2     Surgical History:  Past Surgical History  Procedure Laterality Date  . Below knee leg amputation  2005    Right  . Below knee leg amputation  2006    Left  . Appendectomy    . Eye surgery  2012    Laser  . Ptca    . Trudee Kuster hole Right 03/04/2013    Procedure: Haskell Flirt;  Surgeon: Charlie Pitter, MD;  Location: [facility];  Service: Neurosurgery;  Laterality: Right;  Burr Holes   . Tee without cardioversion N/A 12/20/2013    Procedure: TRANSESOPHAGEAL ECHOCARDIOGRAM (TEE);  Surgeon: Lelon Perla, MD;  Location: [facility];  Service: Cardiovascular;  Laterality: N/A;     Prescriptions prior to admission  Medication Sig Dispense Refill  . carvedilol (COREG) 6.25 MG tablet Take 6.25 mg by mouth daily.      . diphenhydrAMINE (BENADRYL) 25 mg capsule Take 25 mg by mouth every 8 (eight) hours as needed for itching.      . ezetimibe (ZETIA) 10 MG tablet Take 10 mg by mouth daily.      Marland Kitchen HYDROcodone-acetaminophen (NORCO/VICODIN) 5-325 MG per tablet Take 1 tablet by mouth every 4 (four) hours as needed for pain.  120 tablet  5  . insulin aspart (NOVOLOG) 100 UNIT/ML injection Inject 4 Units into the skin 3 (three) times daily with meals.      Marland Kitchen  insulin glargine (LANTUS) 100 UNIT/ML injection Inject 8 Units into the skin daily with breakfast.      . levETIRAcetam (KEPPRA) 500 MG tablet Take 1 tablet (500 mg total) by mouth 2 (two) times daily.  1 tablet  0  . lisinopril (PRINIVIL,ZESTRIL) 10 MG tablet Take 10 mg by mouth daily.        . simvastatin (ZOCOR) 40 MG tablet Take 40 mg by mouth at bedtime.        . solifenacin (VESICARE) 5 MG tablet Take 5 mg by mouth daily.      . Tamsulosin HCl (FLOMAX) 0.4 MG CAPS Take 0.4 mg by mouth daily.        . traMADol (ULTRAM) 50 MG tablet Take 50 mg by mouth every 6 (six) hours as needed for moderate pain.       Marland Kitchen VOLTAREN 1 % GEL Apply 1 application topically 2 (two) times daily as  needed (pain).         Inpatient Medications:  . aspirin EC  81 mg Oral Daily  . carvedilol  3.125 mg Oral BID WC  . clopidogrel  75 mg Oral Q breakfast  . darifenacin  7.5 mg Oral Daily  . ezetimibe  10 mg Oral Daily  . heparin subcutaneous  5,000 Units Subcutaneous 3 times per day  . insulin aspart  0-5 Units Subcutaneous QHS  . insulin aspart  0-9 Units Subcutaneous TID WC  . insulin glargine  8 Units Subcutaneous Q breakfast  . levETIRAcetam  500 mg Oral BID  . lisinopril  2.5 mg Oral Daily  . simvastatin  40 mg Oral QHS  . tamsulosin  0.4 mg Oral Daily    Allergies:  Allergies  Allergen Reactions  . Ativan [Lorazepam] Other (See Comments)    Very lethargic  . Benadryl [Diphenhydramine Hcl] Other (See Comments)    Very lethargic    History   Social History  . Marital Status: Married    Spouse Name: N/A    Number of Children: 2  . Years of Education: N/A   Occupational History  . Disabled    Social History Main Topics  . Smoking status: Former Research scientist (life sciences)  . Smokeless tobacco: Never Used     Comment: quit smoking 30 years ago  . Alcohol Use: No  . Drug Use: No  . Sexual Activity: Not on file   Other Topics Concern  . Not on file   Social History Narrative  . No narrative on file     Family History  Problem Relation Age of Onset  . Colon cancer Brother     Physical Exam: Filed Vitals:   12/24/13 0132 12/24/13 0516 12/24/13 1026 12/24/13 1353  BP: 122/59 138/60 110/52 105/53  Pulse: 69 70 75 70  Temp: 98.7 F (37.1 C) 98.3 F (36.8 C) 98.5 F (36.9 C) 98.1 F (36.7 C)  TempSrc: Oral Oral Oral Oral  Resp: 18 18 20 20   Height:      Weight:      SpO2: 92% 92% 95% 98%    GEN- The patient is elderly and chronically ill appearing, alert but difficult to understand Head- normocephalic, atraumatic Eyes-  Sclera clear, conjunctiva pink Ears- hearing intact Oropharynx- clear Neck- supple,  Lungs- Clear to ausculation bilaterally, normal work of  breathing Heart- Regular rate and rhythm, no murmurs, rubs or gallops  GI- soft, NT, ND, + BS Extremities- no clubbing, cyanosis, or edema MS- s/p bilateral BKA Skin- no rash or lesion  Psych- euthymic mood, full affect   Labs:   Lab Results  Component Value Date   WBC 7.0 12/20/2013   HGB 13.2 12/20/2013   HCT 39.8 12/20/2013   MCV 95.4 12/20/2013   PLT 208 12/20/2013    Recent Labs Lab 12/20/13 0915  NA 140  K 4.6  CL 107  CO2 20  BUN 25*  CREATININE 1.08  CALCIUM 9.0  GLUCOSE 180*     Radiology/Studies: Ct Angio Head W/cm &/or Wo Cm 12/23/2013   CLINICAL DATA:  Stroke. Confusion and aphasia. Right-sided weakness.  EXAM: CT ANGIOGRAPHY HEAD AND NECK  TECHNIQUE: Multidetector CT imaging of the head and neck was performed using the standard protocol during bolus administration of intravenous contrast. Multiplanar CT image reconstructions and MIPs were obtained to evaluate the vascular anatomy. Carotid stenosis measurements (when applicable) are obtained utilizing NASCET criteria, using the distal internal carotid diameter as the denominator.  CONTRAST:  53mL OMNIPAQUE IOHEXOL 350 MG/ML SOLN  COMPARISON:  None.  CT head 12/20/2013  FINDINGS: CTA HEAD FINDINGS  Ill-defined hypodensity in the left frontal parietal lobe suspicious for subacute infarct. There are scattered small areas of lower density in the left frontal parietal lobe over the convexity consistent with infarction. These were not present on 04/15/2013 but were present on the admitting CT of 12/16/2013. This infarct is difficult to date with certainty but I suspected is subacute. Generalized atrophy. Chronic infarct in the left occipital lobe. Prior burr hole drainage of a subdural hematoma on the right. Mild low-density extra-axial fluid collection on the right is unchanged. This shows some dural enhancement indicative of chronic subdural hematoma and prior surgery. No acute hemorrhage identified. Postcontrast imaging reveals no  enhancing mass lesion.  Left internal carotid artery is occluded in the neck and reconstitutes in the supra clinoid portion. The cavernous carotid is heavily calcified bilaterally with moderately severe stenosis on the right and occlusion on the left. Right middle cerebral artery is patent with a mild to moderate stenosis in the M1 segment. Both anterior cerebral arteries are patent with mild disease. The left MCA territory is hypoperfused as it is supplied by collateral vessels.  Atherosclerotic calcification in the distal vertebral artery bilaterally. Both vertebral arteries contribute to the basilar. The basilar shows diffuse mild disease. Moderate atherosclerotic disease in the posterior cerebral arteries bilaterally.  Negative for cerebral aneurysm.  Review of the MIP images confirms the above findings.  CTA NECK FINDINGS  Proximal great vessels are patent. No mass or adenopathy in the neck.  A right carotid: Right common carotid artery widely patent. Mild atherosclerotic disease the carotid bifurcation without significant stenosis. Right internal carotid artery is patent to the skullbase.  Left carotid: Left common carotid artery widely patent. There is atherosclerotic disease of the carotid bifurcation with occlusion of the left internal carotid artery just past the origin. This vessel is occluded through the cavernous segment.  Vertebral arteries: Scattered atherosclerotic calcification is present in the vertebral arteries bilaterally including the origin the right vertebral artery in the distal vertebral arteries bilaterally. Both vertebral arteries are patent to the basilar.  Review of the MIP images confirms the above findings.  IMPRESSION: Left MCA infarct, suspect this is subacute in duration.  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 There is reconstitution of the left supra clinoid internal carotid artery by collateral vessels. Left MCA territory is patent but hypoperfused.  There  is moderate to severe diffuse intracranial atherosclerotic disease.   Electronically Signed  By: Franchot Gallo M.D.   On: 12/23/2013 08:28   Dg Chest 1 View 12/20/2013   CLINICAL DATA:  Cough, hypertension  EXAM: CHEST - 1 VIEW  COMPARISON:  12/16/2013  FINDINGS: Persistent low lung volumes with streaky bibasilar densities compatible with atelectasis. Stable prominent hilar vascularity. No effusion or pneumothorax. Trachea is midline. Degenerative changes noted of the spine.  IMPRESSION: Stable low lung volumes and basilar atelectasis.   Electronically Signed   By: Daryll Brod M.D.   On: 12/20/2013 08:39   [id number] rhythm, rate 80, normal intervals  TELEMETRY: no longer available for review, but per notes, no afib documented  Assessment and Plan:  The patient has chronic debility and multiple medical problems.  He has an ischemic CM which is longstanding.  He is not a candidate for ICD implantation.  Given his moderate to severe LA enlargement and frequent palpitations, I think that the chance of him having afib is quite high.  I would therefore advise 30 day event monitor rather than an implantable monitor for this patient.  If he does not have afib on his 30 day monitor then we could consider ILR placement at that time.  No further inpatient or outpatient EP workup is planned.  He should re-establish with general cardiology going forward.  EP to see as needed while here. OK to discharge from my standpoint.

## 2013-12-25 ENCOUNTER — Other Ambulatory Visit: Payer: Self-pay | Admitting: *Deleted

## 2013-12-25 DIAGNOSIS — I639 Cerebral infarction, unspecified: Secondary | ICD-10-CM

## 2013-12-25 LAB — GLUCOSE, CAPILLARY
GLUCOSE-CAPILLARY: 171 mg/dL — AB (ref 70–99)
Glucose-Capillary: 126 mg/dL — ABNORMAL HIGH (ref 70–99)

## 2013-12-25 NOTE — Progress Notes (Signed)
[name], is a 80 y.o. male, DOB - 02-09-1933, OZD:[id number]   Patient seen briefly today due for discharge soon per Discharge done yesterday by Dr Sloan Leiter, no further issues, Vital signs stable, to be discharged to rehabilitation/CIR as per plan formulated under the care of neurology and cardiology for CVA.      Filed Vitals:   12/24/13 1800 12/24/13 2129 12/25/13 0138 12/25/13 0538  BP: 113/61 134/52 137/60 112/49  Pulse: 66 66 72 67  Temp: 97.9 F (36.6 C) 98.6 F (37 C) 97.8 F (36.6 C) 97.7 F (36.5 C)  TempSrc: Oral Oral Oral Oral  Resp: 20 20 16 18   Height:      Weight:      SpO2: 100% 94% 96% 93%    Brief exam  In bed in no apparent distress, still is aphasic, will follow basic commands  Right arm strength is 2/5, right leg stump strength 4/5,  CTA B.  Abdomen soft nontender, catheter in place Bilateral BKA    Data Review   Micro Results No results found for this or any previous visit (from the past 240 hour(s)).  Radiology Reports Ct Angio Head W/cm &/or Wo Cm  12/23/2013   CLINICAL DATA:  Stroke. Confusion and aphasia. Right-sided weakness.  EXAM: CT ANGIOGRAPHY HEAD AND NECK  TECHNIQUE: Multidetector CT imaging of the head and neck was performed using the standard protocol during bolus administration of intravenous contrast. Multiplanar CT image reconstructions and MIPs were obtained to evaluate the vascular anatomy. Carotid stenosis measurements (when applicable) are obtained utilizing NASCET criteria, using the distal internal carotid diameter as the denominator.  CONTRAST:  69mL OMNIPAQUE IOHEXOL 350 MG/ML SOLN  COMPARISON:  None.  CT head 12/20/2013  FINDINGS: CTA HEAD FINDINGS  Ill-defined hypodensity in the left frontal parietal lobe suspicious for subacute infarct. There are scattered small areas of lower density in the left frontal parietal lobe over the convexity consistent with infarction. These were not present on 04/15/2013 but were present on  the admitting CT of 12/16/2013. This infarct is difficult to date with certainty but I suspected is subacute. Generalized atrophy. Chronic infarct in the left occipital lobe. Prior burr hole drainage of a subdural hematoma on the right. Mild low-density extra-axial fluid collection on the right is unchanged. This shows some dural enhancement indicative of chronic subdural hematoma and prior surgery. No acute hemorrhage identified. Postcontrast imaging reveals no enhancing mass lesion.  Left internal carotid artery is occluded in the neck and reconstitutes in the supra clinoid portion. The cavernous carotid is heavily calcified bilaterally with moderately severe stenosis on the right and occlusion on the left. Right middle cerebral artery is patent with a mild to moderate stenosis in the M1 segment. Both anterior cerebral arteries are patent with mild disease. The left MCA territory is hypoperfused as it is supplied by collateral vessels.  Atherosclerotic calcification in the distal vertebral artery bilaterally. Both vertebral arteries contribute to the basilar. The basilar shows diffuse mild disease. Moderate atherosclerotic disease in the posterior cerebral arteries bilaterally.  Negative for cerebral aneurysm.  Review of the MIP images confirms the above findings.  CTA NECK FINDINGS  Proximal great vessels are patent. No mass or adenopathy in the neck.  A right carotid: Right common carotid artery widely patent. Mild atherosclerotic disease the carotid bifurcation without significant stenosis. Right internal carotid artery is patent to the skullbase.  Left carotid: Left common carotid artery widely patent. There is atherosclerotic disease of the carotid bifurcation with  occlusion of the left internal carotid artery just past the origin. This vessel is occluded through the cavernous segment.  Vertebral arteries: Scattered atherosclerotic calcification is present in the vertebral arteries bilaterally including the  origin the right vertebral artery in the distal vertebral arteries bilaterally. Both vertebral arteries are patent to the basilar.  Review of the MIP images confirms the above findings.  IMPRESSION: Left MCA infarct, suspect this is subacute in duration.  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 There is reconstitution of the left supra clinoid internal carotid artery by collateral vessels. Left MCA territory is patent but hypoperfused.  There is moderate to severe diffuse intracranial atherosclerotic disease.   Electronically Signed   By: Marlan Palau M.D.   On: 12/23/2013 08:28   Dg Chest 1 View  12/20/2013   CLINICAL DATA:  Cough, hypertension  EXAM: CHEST - 1 VIEW  COMPARISON:  12/16/2013  FINDINGS: Persistent low lung volumes with streaky bibasilar densities compatible with atelectasis. Stable prominent hilar vascularity. No effusion or pneumothorax. Trachea is midline. Degenerative changes noted of the spine.  IMPRESSION: Stable low lung volumes and basilar atelectasis.   Electronically Signed   By: Ruel Favors M.D.   On: 12/20/2013 08:39   Ct Head Wo Contrast  12/20/2013   CLINICAL DATA:  Worsening mental status following CVA  EXAM: CT HEAD WITHOUT CONTRAST  TECHNIQUE: Contiguous axial images were obtained from the base of the skull through the vertex without intravenous contrast.  COMPARISON:  CT HEAD W/O CM dated 12/16/2013; CT HEAD W/O CM dated 04/15/2013  FINDINGS: Similar findings advanced atrophy and sulcal prominence. Stable sequela of right frontal calvarial burr hole with associated prominence of the extra-axial space about the right frontal lobe and chronic adjacent subdural thickening. No definite acute intraparenchymal or extra-axial hemorrhage.  Asymmetric ill-defined areas of hypoattenuating attenuation within the left frontal and parietal lobe appear grossly unchanged. Ill-defined geographic area of hypoattenuation within the right parietal-occipital lobe (image 20)  is also unchanged. Given extensive back parenchymal abnormalities, there is no CT evidence of acute large territory infarct. No intraparenchymal or extra-axial mass or hemorrhage. Unchanged size and configuration of the ventricles and basilar cisterns. No midline shift. Intracranial atherosclerosis. Regional soft tissues appear normal. Post bilateral cataract surgery. No displaced calvarial fracture. Limited visualization of the paranasal sinuses and mastoid air cells appear normal.  IMPRESSION: 1. No acute intracranial process. 2. Similar findings of chronic/subacute watershed infarcts within the right parietal occipital and left frontal parietal lobes without interval change. 3. Stable sequela of prior right frontal lobe subdural hematoma. 4. Stable findings of atrophy and microvascular ischemic disease.   Electronically Signed   By: Simonne Come M.D.   On: 12/20/2013 08:31   Ct Head Wo Contrast  12/16/2013   CLINICAL DATA:  Dysarthria, right facial droop  EXAM: CT HEAD WITHOUT CONTRAST  TECHNIQUE: Contiguous axial images were obtained from the base of the skull through the vertex without intravenous contrast.  COMPARISON:  Prior head CT 04/15/2013  FINDINGS: Resolving heterogeneous extra-axial collection overlying the right frontal convexity consistent with a chronic subdural hematoma/ hygroma. The collection is smaller compared to 04/15/2013. New ill-defined hypoattenuation extending to the cortex in the left frontal watershed region which is an interval finding compared to 04/15/2013. There appears to be some associated encephalomalacia and is favored to be subacute to remote. Similarly, there is a new focus of hypoattenuation in the right posterior watershed region which also has an appearance suggestive of  a subacute to remote process. Additionally, there is global cerebral and cerebellar atrophy and sequelae of chronic microvascular ischemic white matter disease. No focal soft tissue or calvarial  abnormality. Normal aeration of the mastoid air cells and paranasal sinuses. Burr hole defect in the right frontal calvarium.  IMPRESSION: 1. No definite acute intracranial abnormality. 2. Hypoattenuation of the left frontal and right posterior watershed regions represents an interval change compared 04/15/2013 consistent with interval ischemic insults, however both regions have an appearance suggestive of subacute to chronic processes. 3. Slight interval improvement in chronic right frontal subdural hematoma/hygroma. 4. Atrophy and chronic microvascular ischemic white matter disease.   Electronically Signed   By: Jacqulynn Cadet M.D.   On: 12/16/2013 09:30   Ct Angio Neck W/cm &/or Wo/cm  12/21/2013   CLINICAL DATA:  Stroke. Confusion and aphasia. Right-sided weakness.  EXAM: CT ANGIOGRAPHY HEAD AND NECK  TECHNIQUE: Multidetector CT imaging of the head and neck was performed using the standard protocol during bolus administration of intravenous contrast. Multiplanar CT image reconstructions and MIPs were obtained to evaluate the vascular anatomy. Carotid stenosis measurements (when applicable) are obtained utilizing NASCET criteria, using the distal internal carotid diameter as the denominator.  CONTRAST:  72mL OMNIPAQUE IOHEXOL 350 MG/ML SOLN  COMPARISON:  None.  CT head 12/20/2013  FINDINGS: CTA HEAD FINDINGS  Ill-defined hypodensity in the left frontal parietal lobe suspicious for subacute infarct. There are scattered small areas of lower density in the left frontal parietal lobe over the convexity consistent with infarction. These were not present on 04/15/2013 but were present on the admitting CT of 12/16/2013. This infarct is difficult to date with certainty but I suspected is subacute. Generalized atrophy. Chronic infarct in the left occipital lobe. Prior burr hole drainage of a subdural hematoma on the right. Mild low-density extra-axial fluid collection on the right is unchanged. This shows some dural  enhancement indicative of chronic subdural hematoma and prior surgery. No acute hemorrhage identified. Postcontrast imaging reveals no enhancing mass lesion.  Left internal carotid artery is occluded in the neck and reconstitutes in the supra clinoid portion. The cavernous carotid is heavily calcified bilaterally with moderately severe stenosis on the right and occlusion on the left. Right middle cerebral artery is patent with a mild to moderate stenosis in the M1 segment. Both anterior cerebral arteries are patent with mild disease. The left MCA territory is hypoperfused as it is supplied by collateral vessels.  Atherosclerotic calcification in the distal vertebral artery bilaterally. Both vertebral arteries contribute to the basilar. The basilar shows diffuse mild disease. Moderate atherosclerotic disease in the posterior cerebral arteries bilaterally.  Negative for cerebral aneurysm.  Review of the MIP images confirms the above findings.  CTA NECK FINDINGS  Proximal great vessels are patent. No mass or adenopathy in the neck.  A right carotid: Right common carotid artery widely patent. Mild atherosclerotic disease the carotid bifurcation without significant stenosis. Right internal carotid artery is patent to the skullbase.  Left carotid: Left common carotid artery widely patent. There is atherosclerotic disease of the carotid bifurcation with occlusion of the left internal carotid artery just past the origin. This vessel is occluded through the cavernous segment.  Vertebral arteries: Scattered atherosclerotic calcification is present in the vertebral arteries bilaterally including the origin the right vertebral artery in the distal vertebral arteries bilaterally. Both vertebral arteries are patent to the basilar.  Review of the MIP images confirms the above findings.  IMPRESSION: Left MCA infarct, suspect this is subacute  in duration.  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 There is  reconstitution of the left supra clinoid internal carotid artery by collateral vessels. Left MCA territory is patent but hypoperfused.  There is moderate to severe diffuse intracranial atherosclerotic disease.   Electronically Signed   By: Franchot Gallo M.D.   On: 12/21/2013 14:02   Dg Chest Port 1 View  12/16/2013   CLINICAL DATA:  Right-side weakness. Confusion. Diabetic hypertensive patient. History of testicular cancer.  EXAM: PORTABLE CHEST - 1 VIEW  COMPARISON:  03/04/2013.  FINDINGS: Heart size top-normal.  Mild central pulmonary vascular prominence.  Evaluation lung bases slightly limited.  No segmental consolidation.  No plain film evidence of pulmonary malignancy detected on this decreased inspiratory portable exam. Patient would eventually benefit from followup two view chest.  Acromioclavicular joint degenerative changes.  IMPRESSION: Mild central pulmonary vascular prominence and top-normal heart size. Please see above.   Electronically Signed   By: Chauncey Cruel M.D.   On: 12/16/2013 17:28    CBC  Recent Labs Lab 12/20/13 0915  WBC 7.0  HGB 13.2  HCT 39.8  PLT 208  MCV 95.4  MCH 31.7  MCHC 33.2  RDW 14.2    Chemistries   Recent Labs Lab 12/20/13 0915  NA 140  K 4.6  CL 107  CO2 20  GLUCOSE 180*  BUN 25*  CREATININE 1.08  CALCIUM 9.0   ------------------------------------------------------------------------------------------------------------------ estimated creatinine clearance is 67.5 ml/min (by C-G formula based on Cr of 1.08). ------------------------------------------------------------------------------------------------------------------ No results found for this basename: HGBA1C,  in the last 72 hours ------------------------------------------------------------------------------------------------------------------ No results found for this basename: CHOL, HDL, LDLCALC, TRIG, CHOLHDL, LDLDIRECT,  in the last 72  hours ------------------------------------------------------------------------------------------------------------------ No results found for this basename: TSH, T4TOTAL, FREET3, T3FREE, THYROIDAB,  in the last 72 hours ------------------------------------------------------------------------------------------------------------------ No results found for this basename: VITAMINB12, FOLATE, FERRITIN, TIBC, IRON, RETICCTPCT,  in the last 72 hours  Coagulation profile No results found for this basename: INR, PROTIME,  in the last 168 hours  No results found for this basename: DDIMER,  in the last 72 hours  Cardiac Enzymes  Recent Labs Lab 12/20/13 1702 12/21/13 0440 12/21/13 1550  TROPONINI <0.30 <0.30 <0.30   ------------------------------------------------------------------------------------------------------------------ No components found with this basename: POCBNP,

## 2013-12-25 NOTE — Progress Notes (Signed)
Clinical social worker assisted with patient discharge to skilled nursing facility, [facility].  CSW addressed all family questions and concerns. CSW copied chart and added all important documents. CSW also set up patient transportation with [employer] operational officer. Clinical Social Worker will sign off for now as social work intervention is no longer needed.

## 2014-01-07 ENCOUNTER — Encounter (INDEPENDENT_AMBULATORY_CARE_PROVIDER_SITE_OTHER): Payer: Medicare Other

## 2014-01-07 ENCOUNTER — Encounter: Payer: Self-pay | Admitting: *Deleted

## 2014-01-07 DIAGNOSIS — R002 Palpitations: Secondary | ICD-10-CM

## 2014-01-07 NOTE — Progress Notes (Signed)
Patient ID: [name], male   DOB: 10-12-1932, 80 y.o.   MRN: [id number] [employer] 30 day cardiac event monitor applied to patient.  Patients wife, [name], present for instructions.

## 2014-01-13 ENCOUNTER — Encounter: Payer: Self-pay | Admitting: Physician Assistant

## 2014-01-13 ENCOUNTER — Ambulatory Visit (INDEPENDENT_AMBULATORY_CARE_PROVIDER_SITE_OTHER): Payer: Medicare Other | Admitting: Physician Assistant

## 2014-01-13 VITALS — BP 114/50 | HR 79 | Ht 71.0 in | Wt 211.5 lb

## 2014-01-13 DIAGNOSIS — I6350 Cerebral infarction due to unspecified occlusion or stenosis of unspecified cerebral artery: Secondary | ICD-10-CM

## 2014-01-13 DIAGNOSIS — I2510 Atherosclerotic heart disease of native coronary artery without angina pectoris: Secondary | ICD-10-CM

## 2014-01-13 DIAGNOSIS — I10 Essential (primary) hypertension: Secondary | ICD-10-CM

## 2014-01-13 DIAGNOSIS — I2589 Other forms of chronic ischemic heart disease: Secondary | ICD-10-CM

## 2014-01-13 DIAGNOSIS — I739 Peripheral vascular disease, unspecified: Secondary | ICD-10-CM

## 2014-01-13 DIAGNOSIS — I639 Cerebral infarction, unspecified: Secondary | ICD-10-CM

## 2014-01-13 DIAGNOSIS — I255 Ischemic cardiomyopathy: Secondary | ICD-10-CM | POA: Insufficient documentation

## 2014-01-13 DIAGNOSIS — I5022 Chronic systolic (congestive) heart failure: Secondary | ICD-10-CM

## 2014-01-13 NOTE — Patient Instructions (Signed)
MAKE SURE TO [place]  Your physician recommends that you continue on your current medications as directed. Please refer to the Current Medication list given to you today.  FOLLOW UP WITH DR. Mare Ferrari 04/17/14 @ 9:30 am

## 2014-01-13 NOTE — Progress Notes (Signed)
Cardiology Office Note   Date:  01/13/2014   ID:  [name], DOB [birth year], MRN [id number]  PCP:  Marton Redwood, MD  Cardiologist:  Dr. Darlin Coco   Electrophysiologist:  Dr. Thompson Grayer    History of Present Illness: [name] is a 80 y.o. male with a history of CAD, status post prior PCI to the OM1 and RCA in 2006, ischemic cardiomyopathy, systolic CHF, HTN, HL, diabetes, PVD status post prior left femoral-popliteal bypass and subsequent bilateral BKA's, prior subdural hematoma 02/2013 requiring evacuation, seizure disorder. He was recently admitted 5/11-5/19 with a subacute bi-hemispheric CVA, likely embolic. Initial transthoracic echocardiogram demonstrated an EF of 50%. Follow up transesophageal echocardiogram demonstrated an EF of 25-30% and no cardiac source of embolus. I reviewed his hospital chart. It appears that there was another transthoracic echocardiogram performed that was consistent with prior measurements of his ejection fraction (25-30%). This echo cannot be located in the chart by me.  Carotid US demonstrated an occluded LICA.  He was seen by Dr. Rayann Heman. He was not felt to be a candidate for ICD implantation. It was felt that the likelihood he has been having atrial fibrillation is quite high. 30 day event monitor was recommended. Should this return negative, consideration can be given towards placing ILR. He was originally placed on Eliquis.  However, neurology recommended Plavix and aspirin only for now.  He is staying at [facility].  He is here with his wife.  He denies chest pain, dyspnea, PND, edema, or syncope.  He is wheelchair bound and is not that active.  He has R sided weakness.   Studies:  - LHC (06/2005):  Proximal LAD 50%, mid LAD 40%, apical LAD 80%, ostial D2 90%, proximal OM1 stent patent, then 50 and 40% lesions, mid RCA 95% than 40%, distal RCA 70%, PDA and PL branch with scattered 70-80% throughout, EF 20-25%.  PCI: Cypher DES to the  mid RCA.  - Echo (12/18/13):   Hypokinesis of the inferior and inferolateral walls. EF 50%. Grade 1 diastolic dysfunction).  Mod LAE.    - TEEcho (12/20/13):   Akinetic inferior lateral wall EF 25% to 30%.  Mod to severe LAE.  No LA or LAA clot.  Neg microcavitation study.    - Carotid US (12/18/13):  R 1-39%.  LICA occluded.    Recent Labs: 12/16/2013: ALT 8  12/17/2013: HDL Cholesterol by NMR 40; LDL (calc) 40  12/20/2013: Creatinine 1.08; Hemoglobin 13.2; Potassium 4.6   Wt Readings from Last 3 Encounters:  12/22/13 217 lb 1.6 oz (98.476 kg)  12/22/13 217 lb 1.6 oz (98.476 kg)  03/18/13 225 lb 1.4 oz (102.1 kg)     Past Medical History  Diagnosis Date  . Diabetes mellitus   . Hypertension   . Testicular cancer dx'd 08/2010    surg only  . CHF (congestive heart failure)   . CAD (coronary artery disease)     s/p stent to OM1 remotely, DES to mRCA 2006  . Peripheral vascular disease   . Vitamin B12 deficiency   . Diabetic retinopathy   . Diabetic peripheral neuropathy   . Cholelithiasis   . Family history of colon cancer     brother   . 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 Osteomyelitis of finger of right hand 03/2013    R middle finger  . Pressure ulcer of buttock 03/2013    stage 2    Current Outpatient Prescriptions  Medication Sig Dispense Refill  . aspirin EC 81  MG EC tablet Take 1 tablet (81 mg total) by mouth daily.      . carvedilol (COREG) 3.125 MG tablet Take 1 tablet (3.125 mg total) by mouth 2 (two) times daily with a meal.      . clopidogrel (PLAVIX) 75 MG tablet Take 1 tablet (75 mg total) by mouth daily with breakfast.      . ezetimibe (ZETIA) 10 MG tablet Take 10 mg by mouth daily.      . insulin aspart (NOVOLOG) 100 UNIT/ML injection 0-9 Units, Subcutaneous, 3 times daily with meals CBG < 70: implement hypoglycemia protocol CBG 70 - 120: 0 units CBG 121 - 150: 1 unit CBG 151 - 200: 2 units CBG 201 - 250: 3 units CBG 251 - 300: 5 units CBG 301 - 350: 7 units CBG  351 - 400: 9 units CBG > 400: call MD  10 mL  11  . insulin glargine (LANTUS) 100 UNIT/ML injection Inject 8 Units into the skin daily with breakfast.      . levETIRAcetam (KEPPRA) 500 MG tablet Take 1 tablet (500 mg total) by mouth 2 (two) times daily.  1 tablet  0  . lisinopril (PRINIVIL,ZESTRIL) 2.5 MG tablet Take 1 tablet (2.5 mg total) by mouth daily.      . simvastatin (ZOCOR) 40 MG tablet Take 40 mg by mouth at bedtime.        . solifenacin (VESICARE) 5 MG tablet Take 5 mg by mouth daily.      . Tamsulosin HCl (FLOMAX) 0.4 MG CAPS Take 0.4 mg by mouth daily.         No current facility-administered medications for this visit.    Allergies:   Ativan and Benadryl   Social History:  The patient  reports that he has quit smoking. He has never used smokeless tobacco. He reports that he does not drink alcohol or use illicit drugs.   Family History:  The patient's family history includes Colon cancer in his brother.   ROS:  Please see the history of present illness.      All other systems reviewed and negative.   PHYSICAL EXAM: VS:  BP 114/50  Pulse 79  Ht 5\' 11"  (1.803 m)  Wt 211 lb 8 oz (95.936 kg)  BMI 29.51 kg/m2 Chronically ill-appearing male in a wheelchair in no acute distress HEENT: normal Neck: no JVDat 90 Cardiac:  normal S1, S2; RRR; no murmur Lungs:  clear to auscultation bilaterally, no wheezing, rhonchi or rales Abd: soft, nontender, no hepatomegaly Ext: no edemain thighs bilateral Skin: warm and dry Neuro:  CNs 2-12 intact, no focal abnormalities noted  EKG:  NSR, HR 79 low voltage, PVCs, nonspecific ST-T wave changes     ASSESSMENT AND PLAN:  1. CAD:  No angina. Continue aspirin, Plavix, beta blocker, statin. 2. Ischemic cardiomyopathy: He is not a candidate for ICD. Ejection fraction has remained stable over time. He does have any signs of volume excess. Continue beta blocker, ACE inhibitor. 3. Chronic systolic heart failure: Volume stable. Continue current  therapy. 4. HYPERTENSION: Controlled. 5. PVD:  He will likely need a followup carotid US in 1 year. 6. CVA (cerebral infarction): I reviewed the available strips from his event monitor. No atrial fibrillation has been documented to date. Dr. Rayann Heman recommended possibly proceeding with ILR if his event monitor is negative for atrial fibrillation. 7. Disposition: The patient was seen by a previous cardiologist. He has not been seen in many years. He was seen  by one of our cardiologists from [place] in the hospital. I will arrange follow up with Dr. Mare Ferrari (DOD).   Signed, Versie Starks, MHS 01/13/2014 12:09 PM    [facility] [place], [place], [place]  [address] Phone: [phone]; Fax: [phone]

## 2014-01-24 ENCOUNTER — Emergency Department (HOSPITAL_COMMUNITY): Payer: Medicare Other

## 2014-01-24 ENCOUNTER — Inpatient Hospital Stay (HOSPITAL_COMMUNITY)
Admission: EM | Admit: 2014-01-24 | Discharge: 2014-01-27 | DRG: 056 | Disposition: A | Discharge status: Skilled Nursing Facility | Payer: Medicare Other | Attending: Internal Medicine | Admitting: Internal Medicine

## 2014-01-24 ENCOUNTER — Encounter (HOSPITAL_COMMUNITY): Payer: Self-pay | Admitting: Emergency Medicine

## 2014-01-24 ENCOUNTER — Telehealth: Payer: Self-pay | Admitting: Neurology

## 2014-01-24 DIAGNOSIS — Z7982 Long term (current) use of aspirin: Secondary | ICD-10-CM

## 2014-01-24 DIAGNOSIS — R4182 Altered mental status, unspecified: Secondary | ICD-10-CM | POA: Diagnosis present

## 2014-01-24 DIAGNOSIS — I739 Peripheral vascular disease, unspecified: Secondary | ICD-10-CM

## 2014-01-24 DIAGNOSIS — Z79899 Other long term (current) drug therapy: Secondary | ICD-10-CM

## 2014-01-24 DIAGNOSIS — Z794 Long term (current) use of insulin: Secondary | ICD-10-CM

## 2014-01-24 DIAGNOSIS — K2970 Gastritis, unspecified, without bleeding: Secondary | ICD-10-CM

## 2014-01-24 DIAGNOSIS — E871 Hypo-osmolality and hyponatremia: Secondary | ICD-10-CM | POA: Diagnosis present

## 2014-01-24 DIAGNOSIS — E1142 Type 2 diabetes mellitus with diabetic polyneuropathy: Secondary | ICD-10-CM | POA: Diagnosis present

## 2014-01-24 DIAGNOSIS — E538 Deficiency of other specified B group vitamins: Secondary | ICD-10-CM

## 2014-01-24 DIAGNOSIS — R319 Hematuria, unspecified: Secondary | ICD-10-CM | POA: Diagnosis present

## 2014-01-24 DIAGNOSIS — I255 Ischemic cardiomyopathy: Secondary | ICD-10-CM

## 2014-01-24 DIAGNOSIS — I69920 Aphasia following unspecified cerebrovascular disease: Secondary | ICD-10-CM

## 2014-01-24 DIAGNOSIS — I2510 Atherosclerotic heart disease of native coronary artery without angina pectoris: Secondary | ICD-10-CM

## 2014-01-24 DIAGNOSIS — E1139 Type 2 diabetes mellitus with other diabetic ophthalmic complication: Secondary | ICD-10-CM | POA: Diagnosis present

## 2014-01-24 DIAGNOSIS — L97909 Non-pressure chronic ulcer of unspecified part of unspecified lower leg with unspecified severity: Secondary | ICD-10-CM

## 2014-01-24 DIAGNOSIS — E11319 Type 2 diabetes mellitus with unspecified diabetic retinopathy without macular edema: Secondary | ICD-10-CM | POA: Diagnosis present

## 2014-01-24 DIAGNOSIS — I2589 Other forms of chronic ischemic heart disease: Secondary | ICD-10-CM | POA: Diagnosis present

## 2014-01-24 DIAGNOSIS — Z89511 Acquired absence of right leg below knee: Secondary | ICD-10-CM

## 2014-01-24 DIAGNOSIS — E1165 Type 2 diabetes mellitus with hyperglycemia: Secondary | ICD-10-CM

## 2014-01-24 DIAGNOSIS — S88119A Complete traumatic amputation at level between knee and ankle, unspecified lower leg, initial encounter: Secondary | ICD-10-CM

## 2014-01-24 DIAGNOSIS — I69998 Other sequelae following unspecified cerebrovascular disease: Principal | ICD-10-CM

## 2014-01-24 DIAGNOSIS — I6203 Nontraumatic chronic subdural hemorrhage: Secondary | ICD-10-CM

## 2014-01-24 DIAGNOSIS — C499 Malignant neoplasm of connective and soft tissue, unspecified: Secondary | ICD-10-CM

## 2014-01-24 DIAGNOSIS — N400 Enlarged prostate without lower urinary tract symptoms: Secondary | ICD-10-CM | POA: Diagnosis present

## 2014-01-24 DIAGNOSIS — K2990 Gastroduodenitis, unspecified, without bleeding: Secondary | ICD-10-CM

## 2014-01-24 DIAGNOSIS — I5022 Chronic systolic (congestive) heart failure: Secondary | ICD-10-CM

## 2014-01-24 DIAGNOSIS — E1149 Type 2 diabetes mellitus with other diabetic neurological complication: Secondary | ICD-10-CM

## 2014-01-24 DIAGNOSIS — Z89512 Acquired absence of left leg below knee: Secondary | ICD-10-CM

## 2014-01-24 DIAGNOSIS — R5381 Other malaise: Secondary | ICD-10-CM | POA: Diagnosis present

## 2014-01-24 DIAGNOSIS — R29898 Other symptoms and signs involving the musculoskeletal system: Secondary | ICD-10-CM

## 2014-01-24 DIAGNOSIS — R569 Unspecified convulsions: Secondary | ICD-10-CM

## 2014-01-24 DIAGNOSIS — K8020 Calculus of gallbladder without cholecystitis without obstruction: Secondary | ICD-10-CM

## 2014-01-24 DIAGNOSIS — G459 Transient cerebral ischemic attack, unspecified: Secondary | ICD-10-CM

## 2014-01-24 DIAGNOSIS — Z800 Family history of malignant neoplasm of digestive organs: Secondary | ICD-10-CM

## 2014-01-24 DIAGNOSIS — I509 Heart failure, unspecified: Secondary | ICD-10-CM | POA: Diagnosis present

## 2014-01-24 DIAGNOSIS — M869 Osteomyelitis, unspecified: Secondary | ICD-10-CM

## 2014-01-24 DIAGNOSIS — Z8547 Personal history of malignant neoplasm of testis: Secondary | ICD-10-CM

## 2014-01-24 DIAGNOSIS — Z7902 Long term (current) use of antithrombotics/antiplatelets: Secondary | ICD-10-CM

## 2014-01-24 DIAGNOSIS — Z9861 Coronary angioplasty status: Secondary | ICD-10-CM

## 2014-01-24 DIAGNOSIS — I69991 Dysphagia following unspecified cerebrovascular disease: Secondary | ICD-10-CM

## 2014-01-24 DIAGNOSIS — R1310 Dysphagia, unspecified: Secondary | ICD-10-CM

## 2014-01-24 DIAGNOSIS — E41 Nutritional marasmus: Secondary | ICD-10-CM | POA: Diagnosis present

## 2014-01-24 DIAGNOSIS — E785 Hyperlipidemia, unspecified: Secondary | ICD-10-CM

## 2014-01-24 DIAGNOSIS — K921 Melena: Secondary | ICD-10-CM

## 2014-01-24 DIAGNOSIS — I10 Essential (primary) hypertension: Secondary | ICD-10-CM

## 2014-01-24 DIAGNOSIS — Z87891 Personal history of nicotine dependence: Secondary | ICD-10-CM

## 2014-01-24 DIAGNOSIS — I69959 Hemiplegia and hemiparesis following unspecified cerebrovascular disease affecting unspecified side: Secondary | ICD-10-CM

## 2014-01-24 DIAGNOSIS — J189 Pneumonia, unspecified organism: Secondary | ICD-10-CM

## 2014-01-24 DIAGNOSIS — R1012 Left upper quadrant pain: Secondary | ICD-10-CM

## 2014-01-24 DIAGNOSIS — E119 Type 2 diabetes mellitus without complications: Secondary | ICD-10-CM

## 2014-01-24 HISTORY — DX: Dysphagia, unspecified: R13.10

## 2014-01-24 LAB — CBC WITH DIFFERENTIAL/PLATELET
BASOS ABS: 0 10*3/uL (ref 0.0–0.1)
Basophils Relative: 0 % (ref 0–1)
EOS PCT: 2 % (ref 0–5)
Eosinophils Absolute: 0.1 10*3/uL (ref 0.0–0.7)
HCT: 44.4 % (ref 39.0–52.0)
Hemoglobin: 15.3 g/dL (ref 13.0–17.0)
LYMPHS PCT: 33 % (ref 12–46)
Lymphs Abs: 3.1 10*3/uL (ref 0.7–4.0)
MCH: 32.6 pg (ref 26.0–34.0)
MCHC: 34.5 g/dL (ref 30.0–36.0)
MCV: 94.7 fL (ref 78.0–100.0)
Monocytes Absolute: 0.5 10*3/uL (ref 0.1–1.0)
Monocytes Relative: 6 % (ref 3–12)
NEUTROS ABS: 5.7 10*3/uL (ref 1.7–7.7)
NEUTROS PCT: 59 % (ref 43–77)
Platelets: 198 10*3/uL (ref 150–400)
RBC: 4.69 MIL/uL (ref 4.22–5.81)
RDW: 13.7 % (ref 11.5–15.5)
WBC: 9.5 10*3/uL (ref 4.0–10.5)

## 2014-01-24 LAB — URINALYSIS, ROUTINE W REFLEX MICROSCOPIC
Bilirubin Urine: NEGATIVE
Glucose, UA: 100 mg/dL — AB
Ketones, ur: NEGATIVE mg/dL
LEUKOCYTES UA: NEGATIVE
Nitrite: NEGATIVE
PROTEIN: NEGATIVE mg/dL
Specific Gravity, Urine: 1.012 (ref 1.005–1.030)
Urobilinogen, UA: 1 mg/dL (ref 0.0–1.0)
pH: 5.5 (ref 5.0–8.0)

## 2014-01-24 LAB — GLUCOSE, CAPILLARY: GLUCOSE-CAPILLARY: 175 mg/dL — AB (ref 70–99)

## 2014-01-24 LAB — URINE MICROSCOPIC-ADD ON

## 2014-01-24 LAB — PHOSPHORUS: Phosphorus: 3.7 mg/dL (ref 2.3–4.6)

## 2014-01-24 LAB — TROPONIN I
Troponin I: <0.3 ng/mL (ref <0.30)
Troponin I: <0.3 ng/mL (ref <0.30)

## 2014-01-24 LAB — BASIC METABOLIC PANEL
BUN: 16 mg/dL (ref 6–23)
CALCIUM: 9.3 mg/dL (ref 8.4–10.5)
CHLORIDE: 98 meq/L (ref 96–112)
CO2: 26 mEq/L (ref 19–32)
Creatinine, Ser: 0.85 mg/dL (ref 0.50–1.35)
GFR calc Af Amer: >90 mL/min (ref >90)
GFR calc non Af Amer: 81 mL/min — ABNORMAL LOW (ref >90)
Glucose, Bld: 242 mg/dL — ABNORMAL HIGH (ref 70–99)
Potassium: 4.9 mEq/L (ref 3.7–5.3)
Sodium: 134 mEq/L — ABNORMAL LOW (ref 137–147)

## 2014-01-24 LAB — TSH: TSH: 2.48 u[IU]/mL (ref 0.350–4.500)

## 2014-01-24 LAB — PRO B NATRIURETIC PEPTIDE: PRO B NATRI PEPTIDE: 292.1 pg/mL (ref 0–450)

## 2014-01-24 LAB — MAGNESIUM: MAGNESIUM: 2 mg/dL (ref 1.5–2.5)

## 2014-01-24 MED ORDER — VANCOMYCIN HCL 10 G IV SOLR
1750.0000 mg | Freq: Once | INTRAVENOUS | Status: AC
  Administered 2014-01-24: 1750 mg via INTRAVENOUS
  Filled 2014-01-24: qty 1750

## 2014-01-24 MED ORDER — ONDANSETRON HCL 4 MG PO TABS
4.0000 mg | ORAL_TABLET | Freq: Four times a day (QID) | ORAL | Status: DC | PRN
  Filled 2014-01-24: qty 1

## 2014-01-24 MED ORDER — SODIUM CHLORIDE 0.9 % IV SOLN: 500.0000 mg | Freq: Two times a day (BID) | INTRAVENOUS | Status: DC

## 2014-01-24 MED ORDER — SODIUM CHLORIDE 0.9 % IV SOLN
500.0000 mg | Freq: Once | INTRAVENOUS | Status: AC
  Administered 2014-01-24: 500 mg via INTRAVENOUS
  Filled 2014-01-24: qty 5

## 2014-01-24 MED ORDER — SODIUM CHLORIDE 0.9 % IJ SOLN
3.0000 mL | Freq: Two times a day (BID) | INTRAMUSCULAR | Status: DC
  Administered 2014-01-25 – 2014-01-27 (×5): 3 mL via INTRAVENOUS

## 2014-01-24 MED ORDER — ENOXAPARIN SODIUM 40 MG/0.4ML ~~LOC~~ SOLN
40.0000 mg | SUBCUTANEOUS | Status: DC
  Administered 2014-01-24: 40 mg via SUBCUTANEOUS
  Filled 2014-01-24 (×2): qty 0.4

## 2014-01-24 MED ORDER — IOHEXOL 350 MG/ML SOLN
50.0000 mL | Freq: Once | INTRAVENOUS | Status: AC | PRN
  Administered 2014-01-24: 50 mL via INTRAVENOUS

## 2014-01-24 MED ORDER — INSULIN ASPART 100 UNIT/ML ~~LOC~~ SOLN
0.0000 [IU] | Freq: Three times a day (TID) | SUBCUTANEOUS | Status: DC
  Administered 2014-01-25: 5 [IU] via SUBCUTANEOUS
  Administered 2014-01-25: 3 [IU] via SUBCUTANEOUS
  Administered 2014-01-26 (×2): 5 [IU] via SUBCUTANEOUS
  Administered 2014-01-26 – 2014-01-27 (×2): 3 [IU] via SUBCUTANEOUS

## 2014-01-24 MED ORDER — LEVETIRACETAM 500 MG PO TABS: 500.0000 mg | ORAL_TABLET | Freq: Once | ORAL | Status: DC

## 2014-01-24 MED ORDER — VANCOMYCIN HCL IN DEXTROSE 1-5 GM/200ML-% IV SOLN
1000.0000 mg | Freq: Two times a day (BID) | INTRAVENOUS | Status: DC
  Administered 2014-01-25: 1000 mg via INTRAVENOUS
  Filled 2014-01-24 (×2): qty 200

## 2014-01-24 MED ORDER — LEVETIRACETAM 500 MG PO TABS
1000.0000 mg | ORAL_TABLET | Freq: Two times a day (BID) | ORAL | Status: DC
  Administered 2014-01-25 – 2014-01-27 (×5): 1000 mg via ORAL
  Filled 2014-01-24 (×7): qty 2

## 2014-01-24 MED ORDER — ONDANSETRON HCL 4 MG/2ML IJ SOLN: 4.0000 mg | Freq: Four times a day (QID) | INTRAMUSCULAR | Status: DC | PRN

## 2014-01-24 MED ORDER — DEXTROSE 5 % IV SOLN
1.0000 g | Freq: Three times a day (TID) | INTRAVENOUS | Status: DC
  Administered 2014-01-25 (×2): 1 g via INTRAVENOUS
  Filled 2014-01-24 (×6): qty 1

## 2014-01-24 MED ORDER — SODIUM CHLORIDE 0.9 % IV SOLN: INTRAVENOUS | Status: DC

## 2014-01-24 NOTE — ED Notes (Signed)
Patient transported to CT 

## 2014-01-24 NOTE — H&P (Addendum)
[facility] History and Physical  Tahjae Clausing AST:[id number] DOB: 1933/02/11 DOA: 01/24/2014  Referring physician: ED physician PCP: Garwin Brothers, MD   Chief Complaint: altered mental status   HPI:  Pt is 80 yo male, resident of SNF with very complex and multiple medical conditions including prior stroke with residual right hemiparesis, aphasia, bilateral BKA, uncontrolled diabetes with complications of neuropathy, retinopathy, HTN, HLD, CAD, PVD, seizures, presented to [facility] ED from SNF after noted to be confused. Please note that pt is unable to provide any history due to the already mentioned complications of chronic medical conditions. Per EMS, the confusion has been described as crying, garbled speech, agitation, each episode lasting 20-30 minutes, and has apparently been noticed almost 2 weeks ago but now much worse. No report of fevers, chills, cough, chest pain, abdominal or urinary concerns.  In ED, pt noted to be hemodynamically stable with intermittent episodes of slurred and garbled speech, able to follow simple commands but unable to provide history, he is able to answer some simple questions. CXR worrisome for potential PNA, CT head notable for chronic and severe intracranial atherosclerotic disease and evidence of prior stroke. [facility] asked to admit for further evaluation.   Assessment and Plan: Active Problems: Altered mental status - this appears to be multifactorial in etiology, ? PNA, UTI, CVA, seizures  - will admit to telemetry bed - place order for SLP, pt failed bedside swallow evaluation - keep pt NPO and hold all PO medications for now - treat with empiric ABX broad spectrum to cover for potential HCAP - obtain urine and sputum cultures - continue keppra for seizures but change to IV  ? PNA - per CXR, unable to obtain history to see if any specific symptoms such as cough, shortness of breath - low grade fever in ED noted 99. 1 F - cover with broad spectrum to  cover for HCAP - obtain sputum culture, urine legionella and strep penumo  History of strokes, left MCA (recent stroke)  - will see with neurology if MRI brain needed  - keep NPO, place order for SLP as pt failed bed side swallow eval   - change meds to IV if possible - keppra ordered via IV Diabetes mellitus with complications of neuropathy and retinopathy - place on SSI for now as pt is NPO  - hold Lantus  HTN - hold PO antihypertensives for now and place only on IV if it becomes needed until swallow evaluation done Severe CAD - hold aspirin and plavix for now until pt passes swallow eval Functional quadriplegia - secondary to progressive nature of the complicated medical conditions outlined above - will return to SNF once ready - discuss code status if family available in AM Severe malnutrition - secondary to stroke, progressive FTT and deconditioning - keep NPI and follow up on SLP   Radiological Exams on Admission: Ct Angio Head W/cm &/or Wo Cm  01/24/2014  Severe intracranial atherosclerotic disease as above. Occluded left internal carotid artery. Severe disease in the left middle cerebral artery with chronic left MCA infarct. No acute infarct.  Chronic burr hole in the right frontal bone. There is diffuse dural thickening and enhancement on the right which may be related to prior surgery. This could also be seen with chronic subdural hematoma, infection, and tumor. Correlate with any history of intracranial tumor. Given the burr hole, this may be a subacute subdural hematoma which enhances.     Dg Chest 2 View  01/24/2014 Increased density at  the left lung bases consistent with atelectasis or early pneumonia. There is no evidence of CHF.   Ct Head Wo Contrast  01/24/2014   The atrophy with prior infarcts on the left. An infarct at the left frontal -parietal junction shows evolution compared to the previous study. There is moderate periventricular small vessel disease. No acute infarct.   There is an essentially stable right frontal subdural hygroma which causes some mild impression on the superior right frontal lobe, stable. There is no acute appearing subdural or epidural fluid collection. There is no acute appearing hemorrhage or midline shift.     Code Status: Full Family Communication: No family at bedside  Disposition Plan: Admit for further evaluation    Review of Systems:  Unable to obtain due to altered mental status   Past Medical History  Diagnosis Date  . Diabetes mellitus   . Hypertension   . Testicular cancer dx'd 08/2010    surg only  . CHF (congestive heart failure)   . CAD (coronary artery disease)     s/p stent to OM1 remotely, DES to mRCA 2006  . Peripheral vascular disease   . Vitamin B12 deficiency   . Diabetic retinopathy   . Diabetic peripheral neuropathy   . Cholelithiasis   . Family history of colon cancer     brother   . 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 Osteomyelitis of finger of right hand 03/2013    R middle finger  . Pressure ulcer of buttock 03/2013    stage 2    Past Surgical History  Procedure Laterality Date  . Below knee leg amputation  2005    Right  . Below knee leg amputation  2006    Left  . Appendectomy    . Eye surgery  2012    Laser  . Ptca    . Trudee Kuster hole Right 03/04/2013    Procedure: Haskell Flirt;  Surgeon: Charlie Pitter, MD;  Location: [place];  Service: Neurosurgery;  Laterality: Right;  Burr Holes   . Tee without cardioversion N/A 12/20/2013    Procedure: TRANSESOPHAGEAL ECHOCARDIOGRAM (TEE);  Surgeon: Lelon Perla, MD;  Location: [facility] END[facility];  Service: Cardiovascular;  Laterality: N/A;    Social History:  reports that he has quit smoking. He has never used smokeless tobacco. He reports that he does not drink alcohol or use illicit drugs.  Allergies  Allergen Reactions  . Ativan [Lorazepam] Other (See Comments)    Very lethargic  . Benadryl [Diphenhydramine Hcl] Other (See Comments)    Very lethargic     Family History  Problem Relation Age of Onset  . Colon cancer Brother     Prior to Admission medications   Medication Sig Start Date End Date Taking? Authorizing Christ Fullenwider  aspirin EC 81 MG EC tablet Take 1 tablet (81 mg total) by mouth daily. 12/24/13  Yes Shanker Kristeen Mans, MD  atorvastatin (LIPITOR) 20 MG tablet Take 20 mg by mouth at bedtime.   Yes Historical Maveric Debono, MD  carvedilol (COREG) 3.125 MG tablet Take 1 tablet (3.125 mg total) by mouth 2 (two) times daily with a meal. 12/24/13  Yes Shanker Kristeen Mans, MD  clopidogrel (PLAVIX) 75 MG tablet Take 1 tablet (75 mg total) by mouth daily with breakfast. 12/24/13  Yes Shanker Kristeen Mans, MD  escitalopram (LEXAPRO) 10 MG tablet Take 10 mg by mouth daily.   Yes Historical Joesphine Schemm, MD  ezetimibe (ZETIA) 10 MG tablet Take 10 mg by mouth every evening.  Yes Historical Elora Wolter, MD  insulin aspart (NOVOLOG) 100 UNIT/ML injection 0-9 Units, Subcutaneous, 3 times daily with meals CBG < 70: implement hypoglycemia protocol CBG 70 - 120: 0 units CBG 121 - 150: 1 unit CBG 151 - 200: 2 units CBG 201 - 250: 3 units CBG 251 - 300: 5 units CBG 301 - 350: 7 units CBG 351 - 400: 9 units CBG > 400: call MD 12/24/13  Yes Shanker Kristeen Mans, MD  insulin glargine (LANTUS) 100 UNIT/ML injection Inject 8 Units into the skin daily with breakfast.   Yes Historical Jerri Hargadon, MD  levETIRAcetam (KEPPRA) 500 MG tablet Take 1 tablet (500 mg total) by mouth 2 (two) times daily. 03/20/13  Yes Hosie Spangle, MD  lisinopril (PRINIVIL,ZESTRIL) 2.5 MG tablet Take 1 tablet (2.5 mg total) by mouth daily. 12/24/13  Yes Shanker Kristeen Mans, MD  oxybutynin (DITROPAN-XL) 5 MG 24 hr tablet Take 5 mg by mouth at bedtime.   Yes Historical Ammar Moffatt, MD  solifenacin (VESICARE) 5 MG tablet Take 5 mg by mouth daily.   Yes Historical Sanvi Ehler, MD  Tamsulosin HCl (FLOMAX) 0.4 MG CAPS Take 0.4 mg by mouth daily.     Yes Historical Aariz Maish, MD  Vitamin D, Ergocalciferol, (DRISDOL)  50000 UNITS CAPS capsule Take 50,000 Units by mouth every 7 (seven) days. On Wednesday   Yes Historical Dakota Stangl, MD    Physical Exam: Filed Vitals:   01/24/14 1400 01/24/14 1430 01/24/14 1630 01/24/14 1649  BP: 127/61 110/60 123/77 123/77  Pulse: 73 70 39 70  Temp:    98.8 F (37.1 C)  TempSrc:    Oral  Resp: 21 23 15 25   Weight:      SpO2: 99% 100% 95% 96%    Physical Exam  Constitutional: Appears chronically ill, unable to provide history but answers some simple questions  HENT: Normocephalic. External right and left ear normal. Dry MM Eyes: Conjunctivae and EOM are normal. PERRLA, no scleral icterus.  Neck: Normal ROM. Neck supple. No JVD. No tracheal deviation. No thyromegaly.  CVS: RRR, no gallops, no carotid bruit.  Pulmonary: Effort and breath sounds normal, diminished breath sounds at bases with rales  Abdominal: Soft. BS +,  no distension, tenderness, rebound or guarding.  Musculoskeletal: Bilateral BKA, no edema  Lymphadenopathy: No lymphadenopathy noted, cervical, inguinal. Neuro: right hemiparesis, noted to move left side spontaneously  Skin: Skin is warm and dry. No rash noted. Not diaphoretic.  Psychiatric: unable to assess due to altered mental status   Labs on Admission:  Basic Metabolic Panel:  Recent Labs Lab 01/24/14 1454  NA 134*  K 4.9  CL 98  CO2 26  GLUCOSE 242*  BUN 16  CREATININE 0.85  CALCIUM 9.3   CBC:  Recent Labs Lab 01/24/14 1454  WBC 9.5  NEUTROABS 5.7  HGB 15.3  HCT 44.4  MCV 94.7  PLT 198   Cardiac Enzymes:  Recent Labs Lab 01/24/14 1454  TROPONINI <0.30   EKG: pending   Faye Ramsay, MD  [facility] Pager [phone]  If 7PM-7AM, please contact night-coverage [email or web address] Password [facility] 01/24/2014, 7:14 PM

## 2014-01-24 NOTE — ED Notes (Signed)
This RN did not start BX due to [facility] not yet obtained

## 2014-01-24 NOTE — Consult Note (Signed)
Reason for Consult: Seizures Referring Physician: Doyle Askew  CC: Left sided shaking, slurred speech  HPI: [name] is an 80 y.o. male with a history of SDH and stroke that is in a SNF facility who was being visited by his wife today.  At lunch she noted that he became unable to feed himself and speech became slurred.  Patient was then noted to have left sided jerking activity.  The patient was taken back to his room where he subsequently had 3 other same events.  The physician was called who reported that the patient had one eariler in the day as well.  It was determined at that point that the patient should be sent to the hospital for further evaluation.      Past Medical History  Diagnosis Date  . Diabetes mellitus   . Hypertension   . Testicular cancer dx'd 08/2010    surg only  . CHF (congestive heart failure)   . CAD (coronary artery disease)     s/p stent to OM1 remotely, DES to mRCA 2006  . Peripheral vascular disease   . Vitamin B12 deficiency   . Diabetic retinopathy   . Diabetic peripheral neuropathy   . Cholelithiasis   . Family history of colon cancer     brother   . 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 Osteomyelitis of finger of right hand 03/2013    R middle finger  . Pressure ulcer of buttock 03/2013    stage 2  . Dysphagia     pt eats a mechanical diet     Past Surgical History  Procedure Laterality Date  . Below knee leg amputation  2005    Right  . Below knee leg amputation  2006    Left  . Appendectomy    . Eye surgery  2012    Laser  . Ptca    . Trudee Kuster hole Right 03/04/2013    Procedure: Haskell Flirt;  Surgeon: Charlie Pitter, MD;  Location: [facility];  Service: Neurosurgery;  Laterality: Right;  Burr Holes   . Tee without cardioversion N/A 12/20/2013    Procedure: TRANSESOPHAGEAL ECHOCARDIOGRAM (TEE);  Surgeon: Lelon Perla, MD;  Location: [facility];  Service: Cardiovascular;  Laterality: N/A;    Family History  Problem Relation Age of Onset  . Colon cancer  Brother     Social History:  reports that he has quit smoking. He has never used smokeless tobacco. He reports that he does not drink alcohol or use illicit drugs.  Allergies  Allergen Reactions  . Ativan [Lorazepam] Other (See Comments)    Very lethargic  . Benadryl [Diphenhydramine Hcl] Other (See Comments)    Very lethargic    Medications: I have reviewed the patient's current medications. Prior to Admission:  Current facility-administered medications:0.9 %  sodium chloride infusion, , Intravenous, Continuous, Alfonzo Feller, DO;  ceFEPIme (MAXIPIME) 1 g in dextrose 5 % 50 mL IVPB, 1 g, Intravenous, Q8H, Theodis Blaze, MD;  vancomycin (VANCOCIN) 1,750 mg in sodium chloride 0.9 % 500 mL IVPB, 1,750 mg, Intravenous, Once, Dareen Piano, [facility] [START ON 01/25/2014] vancomycin (VANCOCIN) IVPB 1000 mg/200 mL premix, 1,000 mg, Intravenous, Q12H, Dareen Piano, [facility] Current outpatient prescriptions:aspirin EC 81 MG EC tablet, Take 1 tablet (81 mg total) by mouth daily., Disp: , Rfl: ;  atorvastatin (LIPITOR) 20 MG tablet, Take 20 mg by mouth at bedtime., Disp: , Rfl: ;  carvedilol (COREG) 3.125 MG tablet, Take 1 tablet (3.125 mg total)  by mouth 2 (two) times daily with a meal., Disp: , Rfl: ;  clopidogrel (PLAVIX) 75 MG tablet, Take 1 tablet (75 mg total) by mouth daily with breakfast., Disp: , Rfl:  escitalopram (LEXAPRO) 10 MG tablet, Take 10 mg by mouth daily., Disp: , Rfl: ;  ezetimibe (ZETIA) 10 MG tablet, Take 10 mg by mouth every evening. , Disp: , Rfl:  insulin aspart (NOVOLOG) 100 UNIT/ML injection, 0-9 Units, Subcutaneous, 3 times daily with meals CBG < 70: implement hypoglycemia protocol CBG 70 - 120: 0 units CBG 121 - 150: 1 unit CBG 151 - 200: 2 units CBG 201 - 250: 3 units CBG 251 - 300: 5 units CBG 301 - 350: 7 units CBG 351 - 400: 9 units CBG > 400: call MD, Disp: 10 mL, Rfl: 11 insulin glargine (LANTUS) 100 UNIT/ML injection, Inject 8 Units into the skin daily with  breakfast., Disp: , Rfl: ;  levETIRAcetam (KEPPRA) 500 MG tablet, Take 1 tablet (500 mg total) by mouth 2 (two) times daily., Disp: 1 tablet, Rfl: 0;  lisinopril (PRINIVIL,ZESTRIL) 2.5 MG tablet, Take 1 tablet (2.5 mg total) by mouth daily., Disp: , Rfl: ;  oxybutynin (DITROPAN-XL) 5 MG 24 hr tablet, Take 5 mg by mouth at bedtime., Disp: , Rfl:  solifenacin (VESICARE) 5 MG tablet, Take 5 mg by mouth daily., Disp: , Rfl: ;  Tamsulosin HCl (FLOMAX) 0.4 MG CAPS, Take 0.4 mg by mouth daily.  , Disp: , Rfl: ;  Vitamin D, Ergocalciferol, (DRISDOL) 50000 UNITS CAPS capsule, Take 50,000 Units by mouth every 7 (seven) days. On Wednesday, Disp: , Rfl:   ROS: History obtained from the patient  General ROS: negative for - chills, fatigue, fever, night sweats, weight gain or weight loss Psychological ROS: negative for - behavioral disorder, hallucinations, memory difficulties, mood swings or suicidal ideation Ophthalmic ROS: negative for - blurry vision, double vision, eye pain or loss of vision ENT ROS: ear pain Allergy and Immunology ROS: negative for - hives or itchy/watery eyes Hematological and Lymphatic ROS: negative for - bleeding problems, bruising or swollen lymph nodes Endocrine ROS: negative for - galactorrhea, hair pattern changes, polydipsia/polyuria or temperature intolerance Respiratory ROS: negative for - cough, hemoptysis, shortness of breath or wheezing Cardiovascular ROS: negative for - chest pain, dyspnea on exertion, edema or irregular heartbeat Gastrointestinal ROS: negative for - abdominal pain, diarrhea, hematemesis, nausea/vomiting or stool incontinence Genito-Urinary ROS: negative for - dysuria, hematuria, incontinence or urinary frequency/urgency Musculoskeletal ROS: negative for - joint swelling or muscular weakness Neurological ROS: as noted in HPI Dermatological ROS: breakdown on right side of nose  Physical Examination: Blood pressure 113/56, pulse 70, temperature 98.8 F  (37.1 C), temperature source Oral, resp. rate 21, weight 99.791 kg (220 lb), SpO2 95.00%.  Neurologic Examination Mental Status: Alert.  Follows simple commands.  Gives "yes" and "no" answers to questions asked.  Cranial Nerves: II: Discs flat bilaterally; Does not blink to confrontation from the right, reactive to light and accommodation III,IV, VI: ptosis not present, extra-ocular motions intact bilaterally V,VII: decrease in the left NLF, facial light touch sensation decreased on the right VIII: hearing normal bilaterally IX,X: gag reflex present XI: shoulder shrug decreased on the right XII: midline tongue extension Motor: Increased tone on the right.  Patient 1-2/5 on the right.  Able to lift the left upper and lower extremity off the bed Sensory: Pinprick and light touch decreased on the right Deep Tendon Reflexes: 1+ in the upper extremities and absent at  the knees.   Plantars: Bilateral BKA Cerebellar: Unable to perform Gait: Unable to test secondary to BKA's CV: pulses palpable in the upper extremities   Laboratory Studies:   Basic Metabolic Panel:  Recent Labs Lab 01/24/14 1454  NA 134*  K 4.9  CL 98  CO2 26  GLUCOSE 242*  BUN 16  CREATININE 0.85  CALCIUM 9.3    Liver Function Tests: No results found for this basename: AST, ALT, ALKPHOS, BILITOT, PROT, ALBUMIN,  in the last 168 hours No results found for this basename: LIPASE, AMYLASE,  in the last 168 hours No results found for this basename: AMMONIA,  in the last 168 hours  CBC:  Recent Labs Lab 01/24/14 1454  WBC 9.5  NEUTROABS 5.7  HGB 15.3  HCT 44.4  MCV 94.7  PLT 198    Cardiac Enzymes:  Recent Labs Lab 01/24/14 1454  TROPONINI <0.30    BNP: No components found with this basename: POCBNP,   CBG: No results found for this basename: GLUCAP,  in the last 168 hours  Microbiology: Results for orders placed during the hospital encounter of 03/04/13  MRSA PCR SCREENING     Status:  None   Collection Time    03/04/13  9:01 PM      Result Value Ref Range Status   MRSA by PCR NEGATIVE  NEGATIVE Final   Comment:            The GeneXpert MRSA Assay (FDA     approved for NASAL specimens     only), is one component of a     comprehensive MRSA colonization     surveillance program. It is not     intended to diagnose MRSA     infection nor to guide or     monitor treatment for     MRSA infections.  CULTURE, BLOOD (SINGLE)     Status: None   Collection Time    03/10/13  4:53 PM      Result Value Ref Range Status   Specimen Description BLOOD LEFT ARM   Final   Special Requests BOTTLES DRAWN AEROBIC ONLY 2CC   Final   Culture  Setup Time     Final   Value: 03/10/2013 21:45     Performed at [facility]   Culture     Final   Value: NO GROWTH 5 DAYS     Performed at [facility]   Report Status 03/16/2013 FINAL   Final  WOUND CULTURE     Status: None   Collection Time    03/10/13  6:38 PM      Result Value Ref Range Status   Specimen Description WOUND   Final   Special Requests RIGHT MIDDLE FINGER UNDER NAIL   Final   Gram Stain     Final   Value: RARE WBC PRESENT, PREDOMINANTLY PMN     NO SQUAMOUS EPITHELIAL CELLS SEEN     NO ORGANISMS SEEN     Performed at [facility]   Culture     Final   Value: FEW PROTEUS MIRABILIS     Performed at [facility]   Report Status 03/13/2013 FINAL   Final   Organism ID, Bacteria PROTEUS MIRABILIS   Final  ANAEROBIC CULTURE     Status: None   Collection Time    03/10/13  6:38 PM      Result Value Ref Range Status   Specimen Description WOUND  Final   Special Requests RIGHT MIDDLE FINGER UNDER NAIL   Final   Gram Stain     Final   Value: RARE WBC PRESENT, PREDOMINANTLY PMN     NO SQUAMOUS EPITHELIAL CELLS SEEN     NO ORGANISMS SEEN     Performed at [facility]   Culture     Final   Value: NO ANAEROBES ISOLATED     Performed at [facility]   Report Status 03/16/2013  FINAL   Final  TISSUE CULTURE     Status: None   Collection Time    03/10/13  6:40 PM      Result Value Ref Range Status   Specimen Description TISSUE   Final   Special Requests RIGHT MIDDLE FINGER UNDER NAIL   Final   Gram Stain     Final   Value: NO WBC SEEN     NO ORGANISMS SEEN     Performed at [facility]   Culture     Final   Value: RARE PROTEUS MIRABILIS     Performed at [facility]   Report Status 03/13/2013 FINAL   Final   Organism ID, Bacteria PROTEUS MIRABILIS   Final    Coagulation Studies: No results found for this basename: LABPROT, INR,  in the last 72 hours  Urinalysis:  Recent Labs Lab 01/24/14 1634  COLORURINE YELLOW  LABSPEC 1.012  PHURINE 5.5  GLUCOSEU 100*  HGBUR MODERATE*  BILIRUBINUR NEGATIVE  KETONESUR NEGATIVE  PROTEINUR NEGATIVE  UROBILINOGEN 1.0  NITRITE NEGATIVE  LEUKOCYTESUR NEGATIVE    Lipid Panel:     Component Value Date/Time   CHOL 92 12/17/2013 0600   TRIG 61 12/17/2013 0600   HDL 40 12/17/2013 0600   CHOLHDL 2.3 12/17/2013 0600   VLDL 12 12/17/2013 0600   LDLCALC 40 12/17/2013 0600    HgbA1C:  Lab Results  Component Value Date   HGBA1C 7.4* 12/17/2013    Urine Drug Screen:   No results found for this basename: labopia, cocainscrnur, labbenz, amphetmu, thcu, labbarb    Alcohol Level: No results found for this basename: ETH,  in the last 168 hours  Other results: EKG: accelerated junctional rhythm at 72 bpm.  Imaging: Ct Angio Head W/cm &/or Wo Cm  01/24/2014   CLINICAL DATA:  Altered mental status. History of diabetes, hypertension, testicular cancer  EXAM: CT ANGIOGRAPHY HEAD  TECHNIQUE: Multidetector CT imaging of the head was performed using the standard protocol during bolus administration of intravenous contrast. Multiplanar CT image reconstructions and MIPs were obtained to evaluate the vascular anatomy.  CONTRAST:  59mL OMNIPAQUE IOHEXOL 350 MG/ML SOLN  COMPARISON:  CT head earlier today.  CT head  12/21/2013  FINDINGS: Right frontal burr hole is noted. Postcontrast imaging reveals enhancement of the dura on the right. This is predominately in the right frontal lobe but extends into the temporal parietal lobe as well. No enhancing intra-axial mass lesion.  Chronic left MCA infarct. No acute infarct. Generalized atrophy with ventricular enlargement.  Severe intracranial atherosclerotic disease.  Calcified severe stenosis of the distal vertebral artery bilaterally. Both vertebral arteries are diseased but contribute to the basilar. There is severe disease in the AICA bilaterally. The basilar is diseased without significant stenosis. Diffuse moderate disease in the posterior cerebral arteries bilaterally.  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 Right internal carotid artery is diseased with calcification. There is severe atherosclerotic calcification in the cavernous carotid bilaterally.  There is small caliber left  M1 segment which is diffusely diseased. Left middle cerebral artery branches are patent but diffusely diseased with small caliber due to low flow. Right M1 segment is diffusely disease. Anterior cerebral arteries are diffusely disease.  Negative for cerebral aneurysm.  Review of the MIP images confirms the above findings.  IMPRESSION: Severe intracranial atherosclerotic disease as above. Occluded left internal carotid artery. Severe disease in the left middle cerebral artery with chronic left MCA infarct. No acute infarct.  Chronic burr hole in the right frontal bone. There is diffuse dural thickening and enhancement on the right which may be related to prior surgery. This could also be seen with chronic subdural hematoma, infection, and tumor. Correlate with any history of intracranial tumor. Given the burr hole, this may be a subacute subdural hematoma which enhances.   Electronically Signed   By: Franchot Gallo M.D.   On: 01/24/2014 18:20   Dg Chest 2 View  01/24/2014   CLINICAL DATA:   Altered mental status ; history of diabetes and CHF  EXAM: CHEST  2 VIEW  COMPARISON:  Portable chest x-ray of Dec 20, 2013. And Dec 16, 2013.  FINDINGS: The lungs remain hypoinflated. There are coarse lung markings at the left lung base that are slightly more conspicuous today. Density in the right perihilar region is less conspicuous but this may be due to changes in positioning. There is no pleural effusion. The cardiac silhouette is mildly enlarged. The pulmonary vascularity is not engorged. The bony thorax is unremarkable.  IMPRESSION: Increased density at the left lung bases consistent with atelectasis or early pneumonia. There is no evidence of CHF.   Electronically Signed   By: David  Martinique   On: 01/24/2014 15:39   Ct Head Wo Contrast  01/24/2014   CLINICAL DATA:  Confusion and slurred speech  EXAM: CT HEAD WITHOUT CONTRAST  TECHNIQUE: Contiguous axial images were obtained from the base of the skull through the vertex without intravenous contrast.  COMPARISON:  Dec 21, 2013  FINDINGS: There is mild diffuse atrophy. There is extra-axial fluid over the right frontal convexity consistent with a prior subdural hematoma with residual fluid in this area. The amount of fluid in this area appears essentially stable. There is some mild mass-effect upon the right frontal lobe superiorly, stable. There is no acute subdural hematoma. There is no epidural fluid. There is no intra-axial mass, hemorrhage, midline shift.  There is evidence of a prior infarct in the posterior left frontal lobe. There is also evidence of prior infarct involving a portion of the left temporal lobe anteriorly. There is evidence of a focal infarct at the left frontal -parietal junction which has shown evolution compared to the previous study. Moderate periventricular small vessel disease is stable. No acute appearing infarct is seen on this examination.  The bony calvarium appears intact except for a burr hole in the right frontal bone, a  stable finding. Mastoid air cells bilaterally are clear.  IMPRESSION: The atrophy with prior infarcts on the left. An infarct at the left frontal -parietal junction shows evolution compared to the previous study. There is moderate periventricular small vessel disease. No acute infarct.  There is an essentially stable right frontal subdural hygroma which causes some mild impression on the superior right frontal lobe, stable. There is no acute appearing subdural or epidural fluid collection. There is no acute appearing hemorrhage or midline shift.   Electronically Signed   By: Lowella Grip M.D.   On: 01/24/2014 15:18  Assessment/Plan: 80 year old male presenting with episodes of left sided jerking and slurred speech. patient unable to follow commands at that time as well.  Patient with a history of SDH and stroke.  On Keppra prophylacticly and per wife patient has never had a seizure.  During evaluation with intermittent clonic activity noted in the left cheek.  At this time do not believe that his presentation represents an acute infarct but likely seizure activity related to his history of SDH.  Head CT reviewed and shows no acute changes.  On MRA severe arteriosclerotic disease noted with an occluded left MCA.  Patient on ASA and Plavix.    Recommendations: 1.  Keppra 500mg  IV now with 500mg  po to be given now as well. 2.  Increase maintenance Keppra to 1000mg  BID po to start in the morning 3.  Seizure precautions 4.  Continue ASA and Plavix  Alexis Goodell, MD [facility] [phone] 01/24/2014, 8:07 PM

## 2014-01-24 NOTE — ED Notes (Signed)
Pt arrived with holter monitor on

## 2014-01-24 NOTE — Telephone Encounter (Signed)
I called [place] back, pt is resident and he stated that the pt has had 2 episodes this am.  (one this am and another a few minutes ago).  I relayed that I would send to ED for evaluation.  He stated they would.

## 2014-01-24 NOTE — ED Notes (Signed)
pts spouse and son at bedside pts spouse reports rt side flaccidity is pts baseline

## 2014-01-24 NOTE — Progress Notes (Signed)
ANTIBIOTIC CONSULT NOTE - INITIAL  Pharmacy Consult for vancomycin Indication: pneumonia  Allergies  Allergen Reactions  . Ativan [Lorazepam] Other (See Comments)    Very lethargic  . Benadryl [Diphenhydramine Hcl] Other (See Comments)    Very lethargic    Patient Measurements: Weight: 220 lb (99.791 kg)  Vital Signs: Temp: 98.8 F (37.1 C) (06/19 1649) Temp src: Oral (06/19 1649) BP: 123/77 mmHg (06/19 1649) Pulse Rate: 70 (06/19 1649) Intake/Output from previous day:   Intake/Output from this shift:    Labs:  Recent Labs  01/24/14 1454  WBC 9.5  HGB 15.3  PLT 198  CREATININE 0.85   The CrCl is unknown because both a height and weight (above a minimum accepted value) are required for this calculation. No results found for this basename: VANCOTROUGH, VANCOPEAK, VANCORANDOM, GENTTROUGH, GENTPEAK, GENTRANDOM, TOBRATROUGH, TOBRAPEAK, TOBRARND, AMIKACINPEAK, AMIKACINTROU, AMIKACIN,  in the last 72 hours   Microbiology: No results found for this or any previous visit (from the past 720 hour(s)).  Medical History: Past Medical History  Diagnosis Date  . Diabetes mellitus   . Hypertension   . Testicular cancer dx'd 08/2010    surg only  . CHF (congestive heart failure)   . CAD (coronary artery disease)     s/p stent to OM1 remotely, DES to mRCA 2006  . Peripheral vascular disease   . Vitamin B12 deficiency   . Diabetic retinopathy   . Diabetic peripheral neuropathy   . Cholelithiasis   . Family history of colon cancer     brother   . 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 Osteomyelitis of finger of right hand 03/2013    R middle finger  . Pressure ulcer of buttock 03/2013    stage 2  . Dysphagia     pt eats a mechanical diet     Assessment: 80 yo male with suspected PNA to begin vancomycin (noted on cefepime per MD). WBC= 9.5, tmax= 99.1, SCr= 0.85 and CrCl ~ 60. CXR noted w/ Increased density at the left lung bases consistent with atelectasis or early  pneumonia.  6/19 vanc>> 6/19 cefepime>>  6/19 blood x2 6/19 resp 6/19 urine  Goal of Therapy:  Vancomycin trough level 15-20 mcg/ml  Plan:  -No cefepime changed needed -Vancomycin 1750mg  IV x1 now followed by 1000mg  IV q12h -Will follow renal function, cultures and clinical progress  Hildred Laser, Pharm D 01/24/2014 7:58 PM

## 2014-01-24 NOTE — Telephone Encounter (Signed)
I agree, he should go to the ER.

## 2014-01-24 NOTE — ED Notes (Signed)
Patient transported to X-ray 

## 2014-01-24 NOTE — ED Notes (Signed)
Per medics pt had total 3 episodes since this am, first at unkn time in am then again at 1200 for 10 min duration and at 1300 for 40 min duration of confusion and slurred "garbled" speech

## 2014-01-24 NOTE — ED Notes (Signed)
Report given to [facility], pt admitted to 4N room 11, [facility] EMT will transport pt to 4N via stretcher on a monitor

## 2014-01-24 NOTE — Telephone Encounter (Signed)
Linward Foster, nurse practitioner from [facility] calling to get an appointment ASAP for patient, states that patient has had 2 TIAs, the most recent one being this morning. Please return call and advise.

## 2014-01-24 NOTE — ED Notes (Signed)
Tasha from [facility] called stating they can  Not get pt to lay flat for MRI, they had the same issue last month and they were unable to complete the MRI then too.

## 2014-01-24 NOTE — ED Notes (Signed)
Admitting MD at bedside.

## 2014-01-24 NOTE — ED Provider Notes (Signed)
CSN: [id number]     Arrival date & time 01/24/14  1352 History   First MD Initiated Contact with Patient 01/24/14 1502     Chief Complaint  Patient presents with  . Altered Mental Status      Patient is a 80 y.o. male presenting with altered mental status. The history is provided by the EMS personnel and the spouse. The history is limited by the condition of the patient (Hx aphasia).  Altered Mental Status Pt was seen at 1510. Per EMS, NH report, and family: state pt has had multiple intermittent episodes of "confusion" for the past 2 weeks. Pt has had "1 episode last week" and "3 episodes since this morning." Episodes last between 10 and 40 minutes before spontaneously resolving. These "episodes" are described as: pt crying, garbled speech, word finding, and "shaking" arms. Facility states they are concerned regarding multiple TIA's as cause for symptoms. Pt has hx recent CVA with right sided residual weakness and speech that "isn't quite back to his usual yet." Denies fevers, no vomiting/diarrhea, no syncope, no SOB/cough.     Past Medical History  Diagnosis Date  . Diabetes mellitus   . Hypertension   . Testicular cancer dx'd 08/2010    surg only  . CHF (congestive heart failure)   . CAD (coronary artery disease)     s/p stent to OM1 remotely, DES to mRCA 2006  . Peripheral vascular disease   . Vitamin B12 deficiency   . Diabetic retinopathy   . Diabetic peripheral neuropathy   . Cholelithiasis   . Family history of colon cancer     brother   . 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 Osteomyelitis of finger of right hand 03/2013    R middle finger  . Pressure ulcer of buttock 03/2013    stage 2   Past Surgical History  Procedure Laterality Date  . Below knee leg amputation  2005    Right  . Below knee leg amputation  2006    Left  . Appendectomy    . Eye surgery  2012    Laser  . Ptca    . Trudee Kuster hole Right 03/04/2013    Procedure: Haskell Flirt;  Surgeon: Charlie Pitter, MD;  Location: [facility];  Service: Neurosurgery;  Laterality: Right;  Burr Holes   . Tee without cardioversion N/A 12/20/2013    Procedure: TRANSESOPHAGEAL ECHOCARDIOGRAM (TEE);  Surgeon: Lelon Perla, MD;  Location: [facility];  Service: Cardiovascular;  Laterality: N/A;   Family History  Problem Relation Age of Onset  . Colon cancer Brother    History  Substance Use Topics  . Smoking status: Former Research scientist (life sciences)  . Smokeless tobacco: Never Used     Comment: quit smoking 30 years ago  . Alcohol Use: No    Review of Systems  Unable to perform ROS: Patient nonverbal      Allergies  Ativan and Benadryl  Home Medications   Prior to Admission medications   Medication Sig Start Date End Date Taking? Authorizing Provider  aspirin EC 81 MG EC tablet Take 1 tablet (81 mg total) by mouth daily. 12/24/13  Yes Shanker Kristeen Mans, MD  atorvastatin (LIPITOR) 20 MG tablet Take 20 mg by mouth at bedtime.   Yes Historical Provider, MD  carvedilol (COREG) 3.125 MG tablet Take 1 tablet (3.125 mg total) by mouth 2 (two) times daily with a meal. 12/24/13  Yes Shanker Kristeen Mans, MD  clopidogrel (PLAVIX) 75 MG tablet Take 1 tablet (  75 mg total) by mouth daily with breakfast. 12/24/13  Yes Shanker Kristeen Mans, MD  escitalopram (LEXAPRO) 10 MG tablet Take 10 mg by mouth daily.   Yes Historical Provider, MD  ezetimibe (ZETIA) 10 MG tablet Take 10 mg by mouth every evening.    Yes Historical Provider, MD  insulin aspart (NOVOLOG) 100 UNIT/ML injection 0-9 Units, Subcutaneous, 3 times daily with meals CBG < 70: implement hypoglycemia protocol CBG 70 - 120: 0 units CBG 121 - 150: 1 unit CBG 151 - 200: 2 units CBG 201 - 250: 3 units CBG 251 - 300: 5 units CBG 301 - 350: 7 units CBG 351 - 400: 9 units CBG > 400: call MD 12/24/13  Yes Shanker Kristeen Mans, MD  insulin glargine (LANTUS) 100 UNIT/ML injection Inject 8 Units into the skin daily with breakfast.   Yes Historical Provider, MD  levETIRAcetam (KEPPRA) 500 MG tablet  Take 1 tablet (500 mg total) by mouth 2 (two) times daily. 03/20/13  Yes Hosie Spangle, MD  lisinopril (PRINIVIL,ZESTRIL) 2.5 MG tablet Take 1 tablet (2.5 mg total) by mouth daily. 12/24/13  Yes Shanker Kristeen Mans, MD  oxybutynin (DITROPAN-XL) 5 MG 24 hr tablet Take 5 mg by mouth at bedtime.   Yes Historical Provider, MD  solifenacin (VESICARE) 5 MG tablet Take 5 mg by mouth daily.   Yes Historical Provider, MD  Tamsulosin HCl (FLOMAX) 0.4 MG CAPS Take 0.4 mg by mouth daily.     Yes Historical Provider, MD  Vitamin D, Ergocalciferol, (DRISDOL) 50000 UNITS CAPS capsule Take 50,000 Units by mouth every 7 (seven) days. On Wednesday   Yes Historical Provider, MD   BP 110/60  Pulse 70  Temp(Src) 99.1 F (37.3 C) (Oral)  Resp 23  Wt 220 lb (99.791 kg)  SpO2 100% Physical Exam 1515: Physical examination:  Nursing notes reviewed; Vital signs and O2 SAT reviewed;  Constitutional: Well developed, Well nourished, In no acute distress; Head:  Normocephalic, atraumatic; Eyes: EOMI, PERRL, No scleral icterus; ENMT: Mouth and pharynx normal, Mucous membranes dry; Neck: Supple, Full range of motion, No lymphadenopathy; Cardiovascular: Regular rate and rhythm, No gallop; Respiratory: Breath sounds clear & equal bilaterally, No wheezes. Normal respiratory effort/excursion; Chest: Nontender, Movement normal; Abdomen: Soft, Nontender, Nondistended, Normal bowel sounds; Genitourinary: No CVA tenderness; Extremities: Pulses normal, No tenderness, No edema, Bilat BKA..; Neuro: Awake, alert. Non-verbal. No facial droop. +RUE and RLE hemiparesis per hx. Pt will move LLE and LUE spontaneously and to command without apparent gross focal deficits..; Skin: Color normal, Warm, Dry.   ED Course  Procedures     EKG Interpretation   Date/Time:  Friday January 24 2014 13:54:32 EDT Ventricular Rate:  72 PR Interval:    QRS Duration: 108 QT Interval:  390 QTC Calculation: 427 R Axis:   -30 Text Interpretation:  Poor data  quality Normal sinus rhythm Left axis  deviation Artifact Poor data quality in current ECG precludes serial  comparison Confirmed by [facility]  MD, Nunzio Cory [id number]) on 01/24/2014  3:34:21 PM      MDM  MDM Reviewed: previous chart, nursing note and vitals Reviewed previous: labs and ECG Interpretation: labs, ECG, x-ray, CT scan and MRI    Results for orders placed during the hospital encounter of 01/24/14  CBC WITH DIFFERENTIAL      Result Value Ref Range   WBC 9.5  4.0 - 10.5 K/uL   RBC 4.69  4.22 - 5.81 MIL/uL   Hemoglobin 15.3  13.0 - 17.0  g/dL   HCT 44.4  39.0 - 52.0 %   MCV 94.7  78.0 - 100.0 fL   MCH 32.6  26.0 - 34.0 pg   MCHC 34.5  30.0 - 36.0 g/dL   RDW 13.7  11.5 - 15.5 %   Platelets 198  150 - 400 K/uL   Neutrophils Relative % 59  43 - 77 %   Neutro Abs 5.7  1.7 - 7.7 K/uL   Lymphocytes Relative 33  12 - 46 %   Lymphs Abs 3.1  0.7 - 4.0 K/uL   Monocytes Relative 6  3 - 12 %   Monocytes Absolute 0.5  0.1 - 1.0 K/uL   Eosinophils Relative 2  0 - 5 %   Eosinophils Absolute 0.1  0.0 - 0.7 K/uL   Basophils Relative 0  0 - 1 %   Basophils Absolute 0.0  0.0 - 0.1 K/uL  BASIC METABOLIC PANEL      Result Value Ref Range   Sodium 134 (*) 137 - 147 mEq/L   Potassium 4.9  3.7 - 5.3 mEq/L   Chloride 98  96 - 112 mEq/L   CO2 26  19 - 32 mEq/L   Glucose, Bld 242 (*) 70 - 99 mg/dL   BUN 16  6 - 23 mg/dL   Creatinine, Ser 0.85  0.50 - 1.35 mg/dL   Calcium 9.3  8.4 - 10.5 mg/dL   GFR calc non Af Amer 81 (*) >90 mL/min   GFR calc Af Amer >90  >90 mL/min  URINALYSIS, ROUTINE W REFLEX MICROSCOPIC      Result Value Ref Range   Color, Urine YELLOW  YELLOW   APPearance CLEAR  CLEAR   Specific Gravity, Urine 1.012  1.005 - 1.030   pH 5.5  5.0 - 8.0   Glucose, UA 100 (*) NEGATIVE mg/dL   Hgb urine dipstick MODERATE (*) NEGATIVE   Bilirubin Urine NEGATIVE  NEGATIVE   Ketones, ur NEGATIVE  NEGATIVE mg/dL   Protein, ur NEGATIVE  NEGATIVE mg/dL   Urobilinogen, UA 1.0  0.0 - 1.0  mg/dL   Nitrite NEGATIVE  NEGATIVE   Leukocytes, UA NEGATIVE  NEGATIVE  TROPONIN I      Result Value Ref Range   Troponin I <0.30  <0.30 ng/mL  URINE MICROSCOPIC-ADD ON      Result Value Ref Range   Squamous Epithelial / LPF RARE  RARE   WBC, UA 0-2  <3 WBC/hpf   RBC / HPF 3-6  <3 RBC/hpf   Ct Angio Head W/cm &/or Wo Cm 01/24/2014   CLINICAL DATA:  Altered mental status. History of diabetes, hypertension, testicular cancer  EXAM: CT ANGIOGRAPHY HEAD  TECHNIQUE: Multidetector CT imaging of the head was performed using the standard protocol during bolus administration of intravenous contrast. Multiplanar CT image reconstructions and MIPs were obtained to evaluate the vascular anatomy.  CONTRAST:  46mL OMNIPAQUE IOHEXOL 350 MG/ML SOLN  COMPARISON:  CT head earlier today.  CT head 12/21/2013  FINDINGS: Right frontal burr hole is noted. Postcontrast imaging reveals enhancement of the dura on the right. This is predominately in the right frontal lobe but extends into the temporal parietal lobe as well. No enhancing intra-axial mass lesion.  Chronic left MCA infarct. No acute infarct. Generalized atrophy with ventricular enlargement.  Severe intracranial atherosclerotic disease.  Calcified severe stenosis of the distal vertebral artery bilaterally. Both vertebral arteries are diseased but contribute to the basilar. There is severe disease in the AICA bilaterally.  The basilar is diseased without significant stenosis. Diffuse moderate disease in the posterior cerebral arteries bilaterally.  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 Right internal carotid artery is diseased with calcification. There is severe atherosclerotic calcification in the cavernous carotid bilaterally.  There is small caliber left M1 segment which is diffusely diseased. Left middle cerebral artery branches are patent but diffusely diseased with small caliber due to low flow. Right M1 segment is diffusely disease. Anterior cerebral arteries  are diffusely disease.  Negative for cerebral aneurysm.  Review of the MIP images confirms the above findings.  IMPRESSION: Severe intracranial atherosclerotic disease as above. Occluded left internal carotid artery. Severe disease in the left middle cerebral artery with chronic left MCA infarct. No acute infarct.  Chronic burr hole in the right frontal bone. There is diffuse dural thickening and enhancement on the right which may be related to prior surgery. This could also be seen with chronic subdural hematoma, infection, and tumor. Correlate with any history of intracranial tumor. Given the burr hole, this may be a subacute subdural hematoma which enhances.   Electronically Signed   By: Franchot Gallo M.D.   On: 01/24/2014 18:20   Dg Chest 2 View 01/24/2014   CLINICAL DATA:  Altered mental status ; history of diabetes and CHF  EXAM: CHEST  2 VIEW  COMPARISON:  Portable chest x-ray of Dec 20, 2013. And Dec 16, 2013.  FINDINGS: The lungs remain hypoinflated. There are coarse lung markings at the left lung base that are slightly more conspicuous today. Density in the right perihilar region is less conspicuous but this may be due to changes in positioning. There is no pleural effusion. The cardiac silhouette is mildly enlarged. The pulmonary vascularity is not engorged. The bony thorax is unremarkable.  IMPRESSION: Increased density at the left lung bases consistent with atelectasis or early pneumonia. There is no evidence of CHF.   Electronically Signed   By: David  Martinique   On: 01/24/2014 15:39   Ct Head Wo Contrast 01/24/2014   CLINICAL DATA:  Confusion and slurred speech  EXAM: CT HEAD WITHOUT CONTRAST  TECHNIQUE: Contiguous axial images were obtained from the base of the skull through the vertex without intravenous contrast.  COMPARISON:  Dec 21, 2013  FINDINGS: There is mild diffuse atrophy. There is extra-axial fluid over the right frontal convexity consistent with a prior subdural hematoma with residual  fluid in this area. The amount of fluid in this area appears essentially stable. There is some mild mass-effect upon the right frontal lobe superiorly, stable. There is no acute subdural hematoma. There is no epidural fluid. There is no intra-axial mass, hemorrhage, midline shift.  There is evidence of a prior infarct in the posterior left frontal lobe. There is also evidence of prior infarct involving a portion of the left temporal lobe anteriorly. There is evidence of a focal infarct at the left frontal -parietal junction which has shown evolution compared to the previous study. Moderate periventricular small vessel disease is stable. No acute appearing infarct is seen on this examination.  The bony calvarium appears intact except for a burr hole in the right frontal bone, a stable finding. Mastoid air cells bilaterally are clear.  IMPRESSION: The atrophy with prior infarcts on the left. An infarct at the left frontal -parietal junction shows evolution compared to the previous study. There is moderate periventricular small vessel disease. No acute infarct.  There is an essentially stable right frontal subdural hygroma which causes some mild impression  on the superior right frontal lobe, stable. There is no acute appearing subdural or epidural fluid collection. There is no acute appearing hemorrhage or midline shift.   Electronically Signed   By: Lowella Grip M.D.   On: 01/24/2014 15:18    1845: No acute stroke on CT-A. Pt failed bedside swallow test; will start IVF. Dx and testing d/w pt and family.  Questions answered.  Verb understanding, agreeable to admit.  T/C to Neuro Dr. Armida Sans, case discussed, including:  HPI, pertinent PM/SHx, VS/PE, dx testing, ED course and treatment:  Agreeable to consult, requests to admit to medicine service. T/C to [facility] Dr. Doyle Askew, case discussed, including:  HPI, pertinent PM/SHx, VS/PE, dx testing, ED course and treatment:  Agreeable to admit, requests to write temporary  orders, obtain tele bed to team 10.   Alfonzo Feller, DO 01/26/14 1521

## 2014-01-25 DIAGNOSIS — R259 Unspecified abnormal involuntary movements: Secondary | ICD-10-CM

## 2014-01-25 DIAGNOSIS — I2589 Other forms of chronic ischemic heart disease: Secondary | ICD-10-CM

## 2014-01-25 LAB — GLUCOSE, CAPILLARY
GLUCOSE-CAPILLARY: 225 mg/dL — AB (ref 70–99)
Glucose-Capillary: 170 mg/dL — ABNORMAL HIGH (ref 70–99)
Glucose-Capillary: 292 mg/dL — ABNORMAL HIGH (ref 70–99)
Glucose-Capillary: 264 mg/dL — ABNORMAL HIGH (ref 70–99)

## 2014-01-25 LAB — CBC
HEMATOCRIT: 45.3 % (ref 39.0–52.0)
Hemoglobin: 15.4 g/dL (ref 13.0–17.0)
MCH: 33 pg (ref 26.0–34.0)
MCHC: 34 g/dL (ref 30.0–36.0)
MCV: 97.2 fL (ref 78.0–100.0)
Platelets: 161 10*3/uL (ref 150–400)
RBC: 4.66 MIL/uL (ref 4.22–5.81)
RDW: 13.9 % (ref 11.5–15.5)
WBC: 9.8 10*3/uL (ref 4.0–10.5)

## 2014-01-25 LAB — URINE CULTURE
CULTURE: NO GROWTH
Colony Count: NO GROWTH

## 2014-01-25 LAB — HEMOGLOBIN A1C
HEMOGLOBIN A1C: 8.2 % — AB (ref <5.7)
Mean Plasma Glucose: 189 mg/dL — ABNORMAL HIGH (ref <117)

## 2014-01-25 LAB — BASIC METABOLIC PANEL
BUN: 14 mg/dL (ref 6–23)
CHLORIDE: 101 meq/L (ref 96–112)
CO2: 19 mEq/L (ref 19–32)
Calcium: 9.3 mg/dL (ref 8.4–10.5)
Creatinine, Ser: 0.68 mg/dL (ref 0.50–1.35)
GFR, EST NON AFRICAN AMERICAN: 89 mL/min — AB (ref >90)
Glucose, Bld: 206 mg/dL — ABNORMAL HIGH (ref 70–99)
POTASSIUM: 4.4 meq/L (ref 3.7–5.3)
SODIUM: 133 meq/L — AB (ref 137–147)

## 2014-01-25 LAB — TROPONIN I: Troponin I: <0.3 ng/mL (ref <0.30)

## 2014-01-25 LAB — LEGIONELLA ANTIGEN, URINE: Legionella Antigen, Urine: NEGATIVE

## 2014-01-25 LAB — STREP PNEUMONIAE URINARY ANTIGEN: Strep Pneumo Urinary Antigen: NEGATIVE

## 2014-01-25 MED ORDER — TAMSULOSIN HCL 0.4 MG PO CAPS
0.4000 mg | ORAL_CAPSULE | Freq: Every day | ORAL | Status: DC
  Administered 2014-01-25 – 2014-01-27 (×3): 0.4 mg via ORAL
  Filled 2014-01-25 (×4): qty 1

## 2014-01-25 MED ORDER — AMOXICILLIN-POT CLAVULANATE 875-125 MG PO TABS
1.0000 | ORAL_TABLET | Freq: Two times a day (BID) | ORAL | Status: DC
  Administered 2014-01-25 – 2014-01-27 (×5): 1 via ORAL
  Filled 2014-01-25 (×6): qty 1

## 2014-01-25 MED ORDER — CLOPIDOGREL BISULFATE 75 MG PO TABS
75.0000 mg | ORAL_TABLET | Freq: Every day | ORAL | Status: DC
  Administered 2014-01-25 – 2014-01-27 (×3): 75 mg via ORAL
  Filled 2014-01-25 (×3): qty 1

## 2014-01-25 MED ORDER — CARVEDILOL 3.125 MG PO TABS
3.1250 mg | ORAL_TABLET | Freq: Two times a day (BID) | ORAL | Status: DC
  Administered 2014-01-25 – 2014-01-27 (×4): 3.125 mg via ORAL
  Filled 2014-01-25 (×6): qty 1

## 2014-01-25 MED ORDER — ENOXAPARIN SODIUM 40 MG/0.4ML ~~LOC~~ SOLN
40.0000 mg | Freq: Every day | SUBCUTANEOUS | Status: DC
  Filled 2014-01-25: qty 0.4

## 2014-01-25 MED ORDER — RESOURCE THICKENUP CLEAR PO POWD
ORAL | Status: DC | PRN
  Filled 2014-01-25 (×2): qty 125

## 2014-01-25 MED ORDER — EZETIMIBE 10 MG PO TABS
10.0000 mg | ORAL_TABLET | Freq: Every evening | ORAL | Status: DC
  Administered 2014-01-25 – 2014-01-26 (×2): 10 mg via ORAL
  Filled 2014-01-25 (×3): qty 1

## 2014-01-25 MED ORDER — ESCITALOPRAM OXALATE 10 MG PO TABS
10.0000 mg | ORAL_TABLET | Freq: Every day | ORAL | Status: DC
  Administered 2014-01-25 – 2014-01-27 (×3): 10 mg via ORAL
  Filled 2014-01-25 (×3): qty 1

## 2014-01-25 MED ORDER — ASPIRIN 81 MG PO CHEW
81.0000 mg | CHEWABLE_TABLET | Freq: Every day | ORAL | Status: DC
  Administered 2014-01-25 – 2014-01-27 (×3): 81 mg via ORAL
  Filled 2014-01-25 (×4): qty 1

## 2014-01-25 MED ORDER — DARIFENACIN HYDROBROMIDE ER 7.5 MG PO TB24
7.5000 mg | ORAL_TABLET | Freq: Every day | ORAL | Status: DC
Start: 2014-01-25 — End: 2014-01-27
  Administered 2014-01-25 – 2014-01-27 (×3): 7.5 mg via ORAL
  Filled 2014-01-25 (×3): qty 1

## 2014-01-25 MED ORDER — OXYBUTYNIN CHLORIDE ER 5 MG PO TB24
5.0000 mg | ORAL_TABLET | Freq: Every day | ORAL | Status: DC
  Administered 2014-01-25 – 2014-01-26 (×2): 5 mg via ORAL
  Filled 2014-01-25 (×4): qty 1

## 2014-01-25 MED ORDER — ATORVASTATIN CALCIUM 20 MG PO TABS
20.0000 mg | ORAL_TABLET | Freq: Every day | ORAL | Status: DC
  Administered 2014-01-25 – 2014-01-26 (×2): 20 mg via ORAL
  Filled 2014-01-25 (×3): qty 1

## 2014-01-25 MED ORDER — ENSURE PUDDING PO PUDG
1.0000 | Freq: Three times a day (TID) | ORAL | Status: DC
  Administered 2014-01-25 – 2014-01-27 (×7): 1 via ORAL

## 2014-01-25 MED ORDER — INSULIN GLARGINE 100 UNIT/ML ~~LOC~~ SOLN
4.0000 [IU] | Freq: Every day | SUBCUTANEOUS | Status: DC
  Administered 2014-01-25 – 2014-01-26 (×2): 4 [IU] via SUBCUTANEOUS
  Filled 2014-01-25 (×3): qty 0.04

## 2014-01-25 NOTE — Progress Notes (Signed)
Walden Field, NP paged regarding pt's order for Lovenox. Pt still having hematuria. Lynch ordered for Lovenox to be held for tonight. Will continue to monitor pt's output. Elspeth Cho, RN 01/25/2014 [phone]

## 2014-01-25 NOTE — Progress Notes (Signed)
Patient Demographics  [name], is a 80 y.o. male, DOB - 1933-03-14, GEZ:[id number]  Admit date - 01/24/2014   Admitting Physician Theodis Blaze, MD  Outpatient Primary MD for the patient is Garwin Brothers, MD  LOS - 1   Chief Complaint  Patient presents with  . Altered Mental Status           Subjective:   Amedio Bowlby today denies any headache, no chest or abdominal pain no shortness of breath. But does have baseline expressive aphasia and says yes  To pretty much every question.    Assessment & Plan    Altered mental status likely secondary to seizures versus encephalopathy due to progression of previous stroke in the left MCA territory   Has dense right-sided hemiparesis, continue aspirin Plavix for secondary prevention along with statin.  Neurology following, currently on IV Keppra, EEG pending  Any further neurological testing per day.     History of strokes, left MCA (recent stroke)  - Neuro following, aspirin Plavix and statin for secondary prevention, speech eval.     Functional quadriplegia  - secondary to previous stroke with dense right-sided hemiparesis and expressive aphasia along with, bilateral BKA , quite supportive care.     ? 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 Will flush the Foley every 4 hours, monitor closely. Skip today's Lovenox dose.       Code Status: Full  Family Communication: None present  Disposition Plan: Likely SNF   Procedures CT head, CTA head, EEG   Consults  neuro   Medications  Scheduled Meds: . amoxicillin-clavulanate  1 tablet Oral Q12H  . [START ON 01/26/2014] enoxaparin (LOVENOX) injection  40 mg Subcutaneous QHS  . insulin aspart  0-9 Units Subcutaneous TID WC  . levETIRAcetam  1,000 mg Oral BID  . levETIRAcetam  500 mg Oral Once  . sodium chloride  3 mL Intravenous Q12H   Continuous Infusions:  PRN Meds:.ondansetron (ZOFRAN) IV, ondansetron, RESOURCE THICKENUP CLEAR  DVT Prophylaxis  Lovenox    Lab Results  Component Value Date   PLT 161 01/25/2014    Antibiotics     Anti-infectives   Start     Dose/Rate Route Frequency Ordered Stop   01/25/14 1000  amoxicillin-clavulanate (AUGMENTIN) 875-125 MG per tablet 1 tablet     1 tablet Oral Every 12 hours 01/25/14 0947 01/30/14 0959   01/25/14 0900  vancomycin (VANCOCIN) IVPB 1000 mg/200 mL premix  Status:  Discontinued  1,000 mg 200 mL/hr over 60 Minutes Intravenous Every 12 hours 01/24/14 2002 01/25/14 0947   01/24/14 2015  ceFEPIme (MAXIPIME) 1 g in dextrose 5 % 50 mL IVPB  Status:  Discontinued     1 g 100 mL/hr over 30 Minutes Intravenous Every 8 hours 01/24/14 1941 01/25/14 0947   01/24/14 2015  vancomycin (VANCOCIN) 1,750 mg in sodium chloride 0.9 % 500 mL IVPB     1,750 mg 250 mL/hr over 120 Minutes Intravenous  Once 01/24/14 2002 01/25/14 0127          Objective:   Filed Vitals:   01/24/14 2032 01/25/14 0059 01/25/14 0500 01/25/14 1023  BP: 122/70 135/61 125/69 120/78  Pulse: 70 77 74 76  Temp: 98.2 F (36.8 C) 98.4 F (36.9 C) 97.8 F (36.6 C) 97.9 F  (36.6 C)  TempSrc: Oral  Axillary Oral  Resp: 20 20 20 20   Weight:      SpO2: 98% 98% 99% 99%    Wt Readings from Last 3 Encounters:  01/24/14 99.791 kg (220 lb)  01/13/14 95.936 kg (211 lb 8 oz)  12/22/13 98.476 kg (217 lb 1.6 oz)     Intake/Output Summary (Last 24 hours) at 01/25/14 1101 Last data filed at 01/25/14 0500  Gross per 24 hour  Intake      0 ml  Output    600 ml  Net   -600 ml     Physical Exam  Awake Alert, baseline expressive aphasia says yes to everything, Chr right-sided hemiparesis [place].AT,PERRAL Supple Neck,No JVD, No cervical lymphadenopathy appriciated.  Symmetrical Chest wall movement, Good air movement bilaterally, CTAB RRR,No Gallops,Rubs or new Murmurs, No Parasternal Heave +ve B.Sounds, Abd Soft, No tenderness, No organomegaly appriciated, No rebound - guarding or rigidity. No Cyanosis, Clubbing or edema, No new Rash or bruise , bilateral BKA    Data Review   Micro Results No results found for this or any previous visit (from the past 240 hour(s)).  Radiology Reports Ct Angio Head W/cm &/or Wo Cm  01/24/2014   CLINICAL DATA:  Altered mental status. History of diabetes, hypertension, testicular cancer  EXAM: CT ANGIOGRAPHY HEAD  TECHNIQUE: Multidetector CT imaging of the head was performed using the standard protocol during bolus administration of intravenous contrast. Multiplanar CT image reconstructions and MIPs were obtained to evaluate the vascular anatomy.  CONTRAST:  17mL OMNIPAQUE IOHEXOL 350 MG/ML SOLN  COMPARISON:  CT head earlier today.  CT head 12/21/2013  FINDINGS: Right frontal burr hole is noted. Postcontrast imaging reveals enhancement of the dura on the right. This is predominately in the right frontal lobe but extends into the temporal parietal lobe as well. No enhancing intra-axial mass lesion.  Chronic left MCA infarct. No acute infarct. Generalized atrophy with ventricular enlargement.  Severe intracranial atherosclerotic disease.   Calcified severe stenosis of the distal vertebral artery bilaterally. Both vertebral arteries are diseased but contribute to the basilar. There is severe disease in the AICA bilaterally. The basilar is diseased without significant stenosis. Diffuse moderate disease in the posterior cerebral arteries bilaterally.  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 Right internal carotid artery is diseased with calcification. There is severe atherosclerotic calcification in the cavernous carotid bilaterally.  There is small caliber left M1 segment which is diffusely diseased. Left middle cerebral artery branches are patent but diffusely diseased with small caliber due to low flow. Right M1 segment is diffusely disease. Anterior cerebral arteries are diffusely disease.  Negative for cerebral aneurysm.  Review  of the MIP images confirms the above findings.  IMPRESSION: Severe intracranial atherosclerotic disease as above. Occluded left internal carotid artery. Severe disease in the left middle cerebral artery with chronic left MCA infarct. No acute infarct.  Chronic burr hole in the right frontal bone. There is diffuse dural thickening and enhancement on the right which may be related to prior surgery. This could also be seen with chronic subdural hematoma, infection, and tumor. Correlate with any history of intracranial tumor. Given the burr hole, this may be a subacute subdural hematoma which enhances.   Electronically Signed   By: Franchot Gallo M.D.   On: 01/24/2014 18:20   Dg Chest 2 View  01/24/2014   CLINICAL DATA:  Altered mental status ; history of diabetes and CHF  EXAM: CHEST  2 VIEW  COMPARISON:  Portable chest x-ray of Dec 20, 2013. And Dec 16, 2013.  FINDINGS: The lungs remain hypoinflated. There are coarse lung markings at the left lung base that are slightly more conspicuous today. Density in the right perihilar region is less conspicuous but this may be due to changes in positioning. There is no pleural  effusion. The cardiac silhouette is mildly enlarged. The pulmonary vascularity is not engorged. The bony thorax is unremarkable.  IMPRESSION: Increased density at the left lung bases consistent with atelectasis or early pneumonia. There is no evidence of CHF.   Electronically Signed   By: David  Martinique   On: 01/24/2014 15:39   Ct Head Wo Contrast  01/24/2014   CLINICAL DATA:  Confusion and slurred speech  EXAM: CT HEAD WITHOUT CONTRAST  TECHNIQUE: Contiguous axial images were obtained from the base of the skull through the vertex without intravenous contrast.  COMPARISON:  Dec 21, 2013  FINDINGS: There is mild diffuse atrophy. There is extra-axial fluid over the right frontal convexity consistent with a prior subdural hematoma with residual fluid in this area. The amount of fluid in this area appears essentially stable. There is some mild mass-effect upon the right frontal lobe superiorly, stable. There is no acute subdural hematoma. There is no epidural fluid. There is no intra-axial mass, hemorrhage, midline shift.  There is evidence of a prior infarct in the posterior left frontal lobe. There is also evidence of prior infarct involving a portion of the left temporal lobe anteriorly. There is evidence of a focal infarct at the left frontal -parietal junction which has shown evolution compared to the previous study. Moderate periventricular small vessel disease is stable. No acute appearing infarct is seen on this examination.  The bony calvarium appears intact except for a burr hole in the right frontal bone, a stable finding. Mastoid air cells bilaterally are clear.  IMPRESSION: The atrophy with prior infarcts on the left. An infarct at the left frontal -parietal junction shows evolution compared to the previous study. There is moderate periventricular small vessel disease. No acute infarct.  There is an essentially stable right frontal subdural hygroma which causes some mild impression on the superior right  frontal lobe, stable. There is no acute appearing subdural or epidural fluid collection. There is no acute appearing hemorrhage or midline shift.   Electronically Signed   By: Lowella Grip M.D.   On: 01/24/2014 15:18    CBC  Recent Labs Lab 01/24/14 1454 01/25/14 0235  WBC 9.5 9.8  HGB 15.3 15.4  HCT 44.4 45.3  PLT 198 161  MCV 94.7 97.2  MCH 32.6 33.0  MCHC 34.5 34.0  RDW 13.7 13.9  LYMPHSABS 3.1  --   MONOABS 0.5  --   EOSABS 0.1  --   BASOSABS 0.0  --     Chemistries   Recent Labs Lab 01/24/14 1454 01/24/14 2132 01/25/14 0235  NA 134*  --  133*  K 4.9  --  4.4  CL 98  --  101  CO2 26  --  19  GLUCOSE 242*  --  206*  BUN 16  --  14  CREATININE 0.85  --  0.68  CALCIUM 9.3  --  9.3  MG  --  2.0  --    ------------------------------------------------------------------------------------------------------------------ CrCl is unknown because both a height and weight (above a minimum accepted value) are required for this calculation. ------------------------------------------------------------------------------------------------------------------  Recent Labs  01/24/14 2132  HGBA1C 8.2*   ------------------------------------------------------------------------------------------------------------------ No results found for this basename: CHOL, HDL, LDLCALC, TRIG, CHOLHDL, LDLDIRECT,  in the last 72 hours ------------------------------------------------------------------------------------------------------------------  Recent Labs  01/24/14 2132  TSH 2.480   ------------------------------------------------------------------------------------------------------------------ No results found for this basename: VITAMINB12, FOLATE, FERRITIN, TIBC, IRON, RETICCTPCT,  in the last 72 hours  Coagulation profile No results found for this basename: INR, PROTIME,  in the last 168 hours  No results found for this basename: DDIMER,  in the last 72 hours  Cardiac  Enzymes  Recent Labs Lab 01/24/14 2132 01/25/14 0235 01/25/14 0844  TROPONINI <0.30 <0.30 <0.30   ------------------------------------------------------------------------------------------------------------------ No components found with this basename: POCBNP,      Time Spent in minutes   35   SINGH,PRASHANT K M.D on 01/25/2014 at 11:01 AM  Between 7am to 7pm - Pager - [phone]  After 7pm go to [email or web address] - password [email or web address]  And look for the night coverage person covering for me after hours  [facility] Office  [phone]   **Disclaimer: This note may have been dictated with voice recognition software. Similar sounding words can inadvertently be transcribed and this note may contain transcription errors which may not have been corrected upon publication of note.**

## 2014-01-25 NOTE — Evaluation (Signed)
Clinical/Bedside Swallow Evaluation Patient Details  Name: [name] MRN: [id number] Date of Birth: 11/26/1932  Today's Date: 01/25/2014 Time: 6546-5035 SLP Time Calculation (min): 32 min  Past Medical History:  Past Medical History  Diagnosis Date  . Diabetes mellitus   . Hypertension   . Testicular cancer dx'd 08/2010    surg only  . CHF (congestive heart failure)   . CAD (coronary artery disease)     s/p stent to OM1 remotely, DES to mRCA 2006  . Peripheral vascular disease   . Vitamin B12 deficiency   . Diabetic retinopathy   . Diabetic peripheral neuropathy   . Cholelithiasis   . Family history of colon cancer     brother   . 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 Osteomyelitis of finger of right hand 03/2013    R middle finger  . Pressure ulcer of buttock 03/2013    stage 2  . Dysphagia     pt eats a mechanical diet    Past Surgical History:  Past Surgical History  Procedure Laterality Date  . Below knee leg amputation  2005    Right  . Below knee leg amputation  2006    Left  . Appendectomy    . Eye surgery  2012    Laser  . Ptca    . Trudee Kuster hole Right 03/04/2013    Procedure: Haskell Flirt;  Surgeon: Charlie Pitter, MD;  Location: [name];  Service: Neurosurgery;  Laterality: Right;  Burr Holes   . Tee without cardioversion N/A 12/20/2013    Procedure: TRANSESOPHAGEAL ECHOCARDIOGRAM (TEE);  Surgeon: Lelon Perla, MD;  Location: [name];  Service: Cardiovascular;  Laterality: N/A;   HPI:  80 yo male adm to [facility] from SNF after found unable to feed himself lunch and noted to have slurred speech by spouse.  Then pt noted to have left sided jerking activity - 3 similar occured that day.  Pt CT negative for acute change. CXR 6/19 indicated left lower density increased consistent with ATX vs infiltrate.  PMH of right SDH S/P bur hole for evacuation for a large chronic subdural hematoma with marked mass effect. Patient also has history of seizures which he currently is on  Keppra.  Per wife he is wheel chair bound, has recently shown increased memory decline, able to feed himself but otherwise needs assistance with all ADL's.  He has a Home health CNA that cares for him.  Pt has a h/o dysphagia reportedly requiring thickened liquids, with MBS 03/07/13 revealingsilent aspiration before the swallow. Pt was recommended to consume regular textures and thin liquids with a chin tuck. Per pt's family, he was cleared by SLP at [name] for regular textures and thin lqiuids without need for chin tuck.    Assessment / Plan / Recommendation Clinical Impression  Pt presents with + s/s of aspiration with thin liquid via cup characterized by delayed strong cough. Suspect pt with premature spillage of liquid into larynx prior to swallow.  Pt admits to h/o dysphagia and choking on liquids.  Adequate tolerance of tsp thin, nectar via tsp, cup, straw, puree, soft cracker noted - although delay in oral transiting/swallow coordination.  Pt will benefit from full supervision due to cognitive and motor deficits.  CT negative for acute event, suspect exacerbation of baseline dysphagia and given CXR findings and medical event, recommend conservative diet.   Pt educated to findings, aspiration precautions and is agreeable to plan.     Aspiration Risk  Moderate  Diet Recommendation Dysphagia 3 (Mechanical Soft);Nectar-thick liquid   Liquid Administration via: Cup;Straw Medication Administration: Crushed with puree Supervision: Patient able to self feed;Staff to assist with self feeding;Full supervision/cueing for compensatory strategies Compensations: Slow rate;Small sips/bites;Check for pocketing Postural Changes and/or Swallow Maneuvers: Seated upright 90 degrees    Other  Recommendations Oral Care Recommendations: Oral care BID   Follow Up Recommendations  Skilled Nursing facility;24 hour supervision/assistance    Frequency and Duration min 2x/week  2 weeks   Pertinent Vitals/Pain  Afebrile, decreased      Swallow Study Prior Functional Status   pt resident of snf    General Date of Onset: 01/25/14 HPI: 80 yo male adm to [facility] from SNF after found unable to feed himself lunch and noted to have slurred speech by spouse.  Then pt noted to have left sided jerking activity - 3 similar occured that day.  Pt CT negative for acute change. CXR 6/19 indicated left lower density increased consistent with ATX vs infiltrate.  PMH of right SDH S/P bur hole for evacuation for a large chronic subdural hematoma with marked mass effect. Patient also has history of seizures which he currently is on Keppra.  Per wife he is wheel chair bound, has recently shown increased memory decline, able to feed himself but otherwise needs assistance with all ADL's.  He has a Home health CNA that cares for him.  Pt has a h/o dysphagia reportedly requiring thickened liquids, with MBS 03/07/13 revealingsilent aspiration before the swallow. Pt was recommended to consume regular textures and thin liquids with a chin tuck. Per pt's family, he was cleared by SLP at [name] for regular textures and thin lqiuids without need for chin tuck.  Type of Study: Bedside swallow evaluation Previous Swallow Assessment: MBS 03/07/13 - please refer to HPI for further details Diet Prior to this Study: NPO Temperature Spikes Noted: No Respiratory Status: Room air History of Recent Intubation: No Behavior/Cognition: Alert;Cooperative;Pleasant mood;Confused;Requires cueing Baseline Vocal Quality: Clear Volitional Cough: Cognitively unable to elicit Volitional Swallow: Unable to elicit    Oral/Motor/Sensory Function Overall Oral Motor/Sensory Function: Other (comment) (difficult to assess given reduced ability to follow commands, twitching left side of face, RN made aware) Labial ROM: Reduced left Labial Symmetry: Abnormal symmetry left Labial Strength: Reduced Lingual Strength: Reduced Facial ROM: Within Functional Limits Facial  Symmetry: Within Functional Limits Facial Strength: Reduced Facial Sensation: Within Functional Limits Velum: Within Functional Limits Mandible: Within Functional Limits   Ice Chips Ice chips: Not tested   Thin Liquid Thin Liquid: Impaired Presentation: Cup;Self Fed;Spoon Oral Phase Impairments: Impaired anterior to posterior transit;Reduced lingual movement/coordination Oral Phase Functional Implications: Prolonged oral transit Pharyngeal  Phase Impairments: Cough - Delayed;Suspected delayed Swallow    Nectar Thick Nectar Thick Liquid: Impaired Presentation: Cup;Straw Oral Phase Impairments: Impaired anterior to posterior transit;Reduced lingual movement/coordination Oral phase functional implications: Prolonged oral transit Pharyngeal Phase Impairments: Suspected delayed Swallow   Honey Thick Honey Thick Liquid: Not tested   Puree Puree: Impaired Presentation: Spoon;Self Fed Oral Phase Impairments: Impaired anterior to posterior transit;Reduced lingual movement/coordination Oral Phase Functional Implications: Prolonged oral transit;Left anterior spillage Pharyngeal Phase Impairments: Suspected delayed Swallow   Solid   GO    Solid: Impaired Oral Phase Impairments: Reduced lingual movement/coordination;Impaired anterior to posterior transit Oral Phase Functional Implications: Other (comment) Pharyngeal Phase Impairments: Suspected delayed Elonda Husky, [facility] SLP [phone]

## 2014-01-25 NOTE — Progress Notes (Signed)
NEURO HOSPITALIST PROGRESS NOTE   SUBJECTIVE:                                                                                                                        [name] is resting in bed comfortably, ate breakfast. He is having mild infrequent twitching around corner left mouth. Keppra increased to 1 gram BID yesterday. No other neurological issues at this moment. EEG pending. Mild hyponatremia.  OBJECTIVE:                                                                                                                           Vital signs in last 24 hours: Temp:  [97.8 F (36.6 C)-99.1 F (37.3 C)] 97.9 F (36.6 C) (06/20 1023) Pulse Rate:  [39-77] 76 (06/20 1023) Resp:  [15-26] 20 (06/20 1023) BP: (110-136)/(45-78) 120/78 mmHg (06/20 1023) SpO2:  [95 %-100 %] 99 % (06/20 1023) Weight:  [99.791 kg (220 lb)] 99.791 kg (220 lb) (06/19 1356)  Intake/Output from previous day: 06/19 0701 - 06/20 0700 In: -  Out: 600 [Urine:600] Intake/Output this shift: Total I/O In: 120 [P.O.:120] Out: -  Nutritional status: Dysphagia  Past Medical History  Diagnosis Date  . Diabetes mellitus   . Hypertension   . Testicular cancer dx'd 08/2010    surg only  . CHF (congestive heart failure)   . CAD (coronary artery disease)     s/p stent to OM1 remotely, DES to mRCA 2006  . Peripheral vascular disease   . Vitamin B12 deficiency   . Diabetic retinopathy   . Diabetic peripheral neuropathy   . Cholelithiasis   . Family history of colon cancer     brother   . 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 Osteomyelitis of finger of right hand 03/2013    R middle finger  . Pressure ulcer of buttock 03/2013    stage 2  . Dysphagia     pt eats a mechanical diet     Neurologic Exam:  Mental Status:  Alert. Disoriented to year-month-place and situation. Answers " yes" and " no " to my questions. Follows simple commands. Cranial Nerves:  II: Discs flat bilaterally;  Does not blink to confrontation from the right, reactive to light and  accommodation  III,IV, VI: ptosis not present, extra-ocular motions intact bilaterally  V,VII: decrease in the left NLF, facial light touch sensation decreased on the right  VIII: hearing normal bilaterally  IX,X: gag reflex present  XI: shoulder shrug decreased on the right  XII: midline tongue extension  Motor:  Increased tone on the right. Patient 1-2/5 on the right. Able to lift the left upper and lower extremity off the bed  Sensory: Pinprick and light touch decreased on the right  Deep Tendon Reflexes: 1+ in the upper extremities and absent at the knees.  Plantars:  Bilateral BKA  Cerebellar:  Unable to perform  Gait: Unable to test secondary to BKA's   Lab Results: Lab Results  Component Value Date/Time   CHOL 92 12/17/2013  6:00 AM   Lipid Panel No results found for this basename: CHOL, TRIG, HDL, CHOLHDL, VLDL, LDLCALC,  in the last 72 hours  Studies/Results: Ct Angio Head W/cm &/or Wo Cm  01/24/2014   CLINICAL DATA:  Altered mental status. History of diabetes, hypertension, testicular cancer  EXAM: CT ANGIOGRAPHY HEAD  TECHNIQUE: Multidetector CT imaging of the head was performed using the standard protocol during bolus administration of intravenous contrast. Multiplanar CT image reconstructions and MIPs were obtained to evaluate the vascular anatomy.  CONTRAST:  2mL OMNIPAQUE IOHEXOL 350 MG/ML SOLN  COMPARISON:  CT head earlier today.  CT head 12/21/2013  FINDINGS: Right frontal burr hole is noted. Postcontrast imaging reveals enhancement of the dura on the right. This is predominately in the right frontal lobe but extends into the temporal parietal lobe as well. No enhancing intra-axial mass lesion.  Chronic left MCA infarct. No acute infarct. Generalized atrophy with ventricular enlargement.  Severe intracranial atherosclerotic disease.  Calcified severe stenosis of the distal vertebral artery bilaterally.  Both vertebral arteries are diseased but contribute to the basilar. There is severe disease in the AICA bilaterally. The basilar is diseased without significant stenosis. Diffuse moderate disease in the posterior cerebral arteries bilaterally.  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 Right internal carotid artery is diseased with calcification. There is severe atherosclerotic calcification in the cavernous carotid bilaterally.  There is small caliber left M1 segment which is diffusely diseased. Left middle cerebral artery branches are patent but diffusely diseased with small caliber due to low flow. Right M1 segment is diffusely disease. Anterior cerebral arteries are diffusely disease.  Negative for cerebral aneurysm.  Review of the MIP images confirms the above findings.  IMPRESSION: Severe intracranial atherosclerotic disease as above. Occluded left internal carotid artery. Severe disease in the left middle cerebral artery with chronic left MCA infarct. No acute infarct.  Chronic burr hole in the right frontal bone. There is diffuse dural thickening and enhancement on the right which may be related to prior surgery. This could also be seen with chronic subdural hematoma, infection, and tumor. Correlate with any history of intracranial tumor. Given the burr hole, this may be a subacute subdural hematoma which enhances.   Electronically Signed   By: Franchot Gallo M.D.   On: 01/24/2014 18:20   Dg Chest 2 View  01/24/2014   CLINICAL DATA:  Altered mental status ; history of diabetes and CHF  EXAM: CHEST  2 VIEW  COMPARISON:  Portable chest x-ray of Dec 20, 2013. And Dec 16, 2013.  FINDINGS: The lungs remain hypoinflated. There are coarse lung markings at the left lung base that are slightly more conspicuous today. Density in the right perihilar region is less conspicuous  but this may be due to changes in positioning. There is no pleural effusion. The cardiac silhouette is mildly enlarged. The pulmonary  vascularity is not engorged. The bony thorax is unremarkable.  IMPRESSION: Increased density at the left lung bases consistent with atelectasis or early pneumonia. There is no evidence of CHF.   Electronically Signed   By: David  Martinique   On: 01/24/2014 15:39   Ct Head Wo Contrast  01/24/2014   CLINICAL DATA:  Confusion and slurred speech  EXAM: CT HEAD WITHOUT CONTRAST  TECHNIQUE: Contiguous axial images were obtained from the base of the skull through the vertex without intravenous contrast.  COMPARISON:  Dec 21, 2013  FINDINGS: There is mild diffuse atrophy. There is extra-axial fluid over the right frontal convexity consistent with a prior subdural hematoma with residual fluid in this area. The amount of fluid in this area appears essentially stable. There is some mild mass-effect upon the right frontal lobe superiorly, stable. There is no acute subdural hematoma. There is no epidural fluid. There is no intra-axial mass, hemorrhage, midline shift.  There is evidence of a prior infarct in the posterior left frontal lobe. There is also evidence of prior infarct involving a portion of the left temporal lobe anteriorly. There is evidence of a focal infarct at the left frontal -parietal junction which has shown evolution compared to the previous study. Moderate periventricular small vessel disease is stable. No acute appearing infarct is seen on this examination.  The bony calvarium appears intact except for a burr hole in the right frontal bone, a stable finding. Mastoid air cells bilaterally are clear.  IMPRESSION: The atrophy with prior infarcts on the left. An infarct at the left frontal -parietal junction shows evolution compared to the previous study. There is moderate periventricular small vessel disease. No acute infarct.  There is an essentially stable right frontal subdural hygroma which causes some mild impression on the superior right frontal lobe, stable. There is no acute appearing subdural or  epidural fluid collection. There is no acute appearing hemorrhage or midline shift.   Electronically Signed   By: Lowella Grip M.D.   On: 01/24/2014 15:18    MEDICATIONS                                                                                                                        Scheduled: . amoxicillin-clavulanate  1 tablet Oral Q12H  . aspirin  81 mg Oral Daily  . atorvastatin  20 mg Oral QHS  . carvedilol  3.125 mg Oral BID WC  . clopidogrel  75 mg Oral Daily  . darifenacin  7.5 mg Oral Daily  . [START ON 01/26/2014] enoxaparin (LOVENOX) injection  40 mg Subcutaneous QHS  . escitalopram  10 mg Oral Daily  . ezetimibe  10 mg Oral QPM  . feeding supplement (ENSURE)  1 Container Oral TID BM  . insulin aspart  0-9 Units Subcutaneous TID WC  . insulin glargine  4 Units  Subcutaneous QHS  . levETIRAcetam  1,000 mg Oral BID  . levETIRAcetam  500 mg Oral Once  . oxybutynin  5 mg Oral QHS  . sodium chloride  3 mL Intravenous Q12H  . tamsulosin  0.4 mg Oral Daily    ASSESSMENT/PLAN:                                                                                                            80 y.o. male with a history of right frontal SDH and left MCA distribution ischemic stroke admitted with probable left focal motor seizures that seem to be better controlled on increased dose of keppra. EEG pending. Will continue current treatment for now.  Dorian Pod, MD [facility] [phone]  01/25/2014, 11:14 AM

## 2014-01-26 LAB — GLUCOSE, CAPILLARY
Glucose-Capillary: 208 mg/dL — ABNORMAL HIGH (ref 70–99)
Glucose-Capillary: 258 mg/dL — ABNORMAL HIGH (ref 70–99)
Glucose-Capillary: 279 mg/dL — ABNORMAL HIGH (ref 70–99)
Glucose-Capillary: 311 mg/dL — ABNORMAL HIGH (ref 70–99)

## 2014-01-26 MED ORDER — ACETAMINOPHEN 325 MG PO TABS
650.0000 mg | ORAL_TABLET | Freq: Four times a day (QID) | ORAL | Status: DC | PRN
Start: 2014-01-26 — End: 2014-01-27
  Administered 2014-01-26 (×2): 650 mg via ORAL
  Filled 2014-01-26 (×2): qty 2

## 2014-01-26 MED ORDER — ENOXAPARIN SODIUM 40 MG/0.4ML ~~LOC~~ SOLN
40.0000 mg | Freq: Every day | SUBCUTANEOUS | Status: DC
  Filled 2014-01-26: qty 0.4

## 2014-01-26 NOTE — Progress Notes (Signed)
Patient Demographics  [name], is a 80 y.o. male, DOB - 1933-01-14, GUY:[id number]  Admit date - 01/24/2014   Admitting Physician Theodis Blaze, MD  Outpatient Primary MD for the patient is Garwin Brothers, MD  LOS - 2   Chief Complaint  Patient presents with  . Altered Mental Status           Subjective:   [name] today denies any headache, no chest or abdominal pain no shortness of breath. But does have baseline expressive aphasia and says yes to  every question.    Assessment & Plan    Altered mental status likely secondary to seizures versus encephalopathy due to progression of previous stroke in the left MCA territory   Has dense right-sided hemiparesis, continue aspirin - Plavix, statin for secondary prevention along with statin.  Neurology following, currently on  Keppra, EEG pending  Any further neurological testing per day.     History of strokes, left MCA (recent stroke)  - Neuro following, aspirin Plavix and statin for secondary prevention, speech eval.     Functional quadriplegia  - secondary to previous stroke with dense right-sided hemiparesis and expressive aphasia along with, bilateral BKA , quite supportive care.     ? 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 Will flush the Foley every 4 hours, monitor closely. Skip today's Lovenox dose.       Code Status: Full  Family Communication: None present  Disposition Plan: Likely SNF   Procedures CT head, CTA head, EEG   Consults  neuro   Medications  Scheduled Meds: . amoxicillin-clavulanate  1 tablet Oral Q12H  . aspirin  81 mg Oral Daily  . atorvastatin  20 mg Oral QHS  . carvedilol  3.125 mg Oral BID WC  . clopidogrel  75 mg Oral Daily  . darifenacin  7.5 mg Oral Daily  . enoxaparin (LOVENOX) injection  40 mg Subcutaneous QHS  . escitalopram  10 mg Oral Daily  . ezetimibe  10 mg Oral QPM  . feeding supplement (ENSURE)  1 Container Oral TID BM  . insulin aspart  0-9 Units Subcutaneous TID WC  . insulin glargine  4 Units Subcutaneous QHS  . levETIRAcetam  1,000 mg Oral BID  . levETIRAcetam  500 mg Oral Once  . oxybutynin  5 mg Oral QHS  . sodium chloride  3 mL Intravenous Q12H  . tamsulosin  0.4 mg Oral Daily   Continuous Infusions:  PRN Meds:.ondansetron (ZOFRAN) IV, ondansetron, RESOURCE THICKENUP CLEAR  DVT Prophylaxis  Lovenox    Lab Results  Component Value Date   PLT 161 01/25/2014    Antibiotics     Anti-infectives   Start     Dose/Rate Route Frequency Ordered Stop   01/25/14 1000  amoxicillin-clavulanate (AUGMENTIN) 875-125 MG per tablet 1 tablet     1 tablet Oral Every 12 hours 01/25/14 0947 01/30/14 0959   01/25/14 0900  vancomycin (VANCOCIN) IVPB 1000 mg/200 mL premix  Status:  Discontinued     1,000 mg 200 mL/hr over 60 Minutes Intravenous Every 12 hours 01/24/14 2002 01/25/14 0947   01/24/14 2015  ceFEPIme (MAXIPIME) 1 g in dextrose 5 % 50 mL IVPB  Status:  Discontinued     1 g 100 mL/hr over 30 Minutes Intravenous Every 8 hours 01/24/14 1941  01/25/14 0947   01/24/14 2015  vancomycin (VANCOCIN) 1,750 mg in sodium chloride 0.9 % 500 mL IVPB     1,750 mg 250 mL/hr over 120 Minutes Intravenous  Once 01/24/14 2002 01/25/14 0127          Objective:   Filed Vitals:   01/25/14 2131 01/26/14 0130 01/26/14 0450 01/26/14 0600  BP: 141/75 134/69  117/72  Pulse: 86 82  80  Temp: 98 F (36.7 C) 98.3 F (36.8 C)  97.2 F (36.2 C)  TempSrc: Oral Oral  Oral  Resp: 20 20  20   Height:   5\' 11"  (1.803 m)   Weight:   95.119 kg (209 lb 11.2 oz)   SpO2: 97% 96%  98%    Wt Readings from Last 3 Encounters:  01/26/14 95.119 kg (209 lb 11.2 oz)  01/13/14 95.936 kg (211 lb 8 oz)  12/22/13 98.476 kg (217 lb 1.6 oz)     Intake/Output Summary (Last 24 hours) at 01/26/14 0954 Last data filed at 01/26/14 0115  Gross per 24 hour  Intake    920 ml  Output   1475 ml  Net   -555 ml     Physical Exam  Awake Alert, baseline expressive aphasia says yes to everything, Chr right-sided hemiparesis [place].AT,PERRAL Supple Neck,No JVD, No cervical lymphadenopathy appriciated.  Symmetrical Chest wall movement, Good air movement bilaterally, CTAB RRR,No Gallops,Rubs or new Murmurs, No Parasternal Heave +ve B.Sounds, Abd Soft, No tenderness, No organomegaly appriciated, No rebound - guarding or rigidity. No Cyanosis, Clubbing or edema, No new Rash or bruise , bilateral BKA , his Foley with red urine in the bag   Data Review   Micro Results Recent Results (from the past 240 hour(s))  URINE CULTURE     Status: None   Collection Time    01/24/14  4:34 PM      Result Value Ref Range Status   Specimen Description URINE, CATHETERIZED   Final   Special Requests NONE   Final   Culture  Setup Time     Final   Value: 01/24/2014 18:29     Performed at [facility]     Final   Value: NO GROWTH     Performed at [facility]   Culture     Final   Value: NO GROWTH     Performed at [facility]   Report Status  01/25/2014 FINAL   Final    Radiology Reports Ct Angio Head W/cm &/or Wo Cm  01/24/2014   CLINICAL DATA:  Altered mental status. History of diabetes, hypertension, testicular  cancer  EXAM: CT ANGIOGRAPHY HEAD  TECHNIQUE: Multidetector CT imaging of the head was performed using the standard protocol during bolus administration of intravenous contrast. Multiplanar CT image reconstructions and MIPs were obtained to evaluate the vascular anatomy.  CONTRAST:  6mL OMNIPAQUE IOHEXOL 350 MG/ML SOLN  COMPARISON:  CT head earlier today.  CT head 12/21/2013  FINDINGS: Right frontal burr hole is noted. Postcontrast imaging reveals enhancement of the dura on the right. This is predominately in the right frontal lobe but extends into the temporal parietal lobe as well. No enhancing intra-axial mass lesion.  Chronic left MCA infarct. No acute infarct. Generalized atrophy with ventricular enlargement.  Severe intracranial atherosclerotic disease.  Calcified severe stenosis of the distal vertebral artery bilaterally. Both vertebral arteries are diseased but contribute to the basilar. There is severe disease in the AICA bilaterally. The basilar is diseased without significant stenosis. Diffuse moderate disease in the posterior cerebral arteries bilaterally.  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 Right internal carotid artery is diseased with calcification. There is severe atherosclerotic calcification in the cavernous carotid bilaterally.  There is small caliber left M1 segment which is diffusely diseased. Left middle cerebral artery branches are patent but diffusely diseased with small caliber due to low flow. Right M1 segment is diffusely disease. Anterior cerebral arteries are diffusely disease.  Negative for cerebral aneurysm.  Review of the MIP images confirms the above findings.  IMPRESSION: Severe intracranial atherosclerotic disease as above. Occluded left internal carotid artery. Severe disease in the left middle  cerebral artery with chronic left MCA infarct. No acute infarct.  Chronic burr hole in the right frontal bone. There is diffuse dural thickening and enhancement on the right which may be related to prior surgery. This could also be seen with chronic subdural hematoma, infection, and tumor. Correlate with any history of intracranial tumor. Given the burr hole, this may be a subacute subdural hematoma which enhances.   Electronically Signed   By: Franchot Gallo M.D.   On: 01/24/2014 18:20   Dg Chest 2 View  01/24/2014   CLINICAL DATA:  Altered mental status ; history of diabetes and CHF  EXAM: CHEST  2 VIEW  COMPARISON:  Portable chest x-ray of Dec 20, 2013. And Dec 16, 2013.  FINDINGS: The lungs remain hypoinflated. There are coarse lung markings at the left lung base that are slightly more conspicuous today. Density in the right perihilar region is less conspicuous but this may be due to changes in positioning. There is no pleural effusion. The cardiac silhouette is mildly enlarged. The pulmonary vascularity is not engorged. The bony thorax is unremarkable.  IMPRESSION: Increased density at the left lung bases consistent with atelectasis or early pneumonia. There is no evidence of CHF.   Electronically Signed   By: David  Martinique   On: 01/24/2014 15:39   Ct Head Wo Contrast  01/24/2014   CLINICAL DATA:  Confusion and slurred speech  EXAM: CT HEAD WITHOUT CONTRAST  TECHNIQUE: Contiguous axial images were obtained from the base of the skull through the vertex without intravenous contrast.  COMPARISON:  Dec 21, 2013  FINDINGS: There is mild diffuse atrophy. There is extra-axial fluid over the right frontal convexity consistent with a prior subdural hematoma with residual fluid in this area. The amount of fluid in this area appears essentially stable. There is some mild mass-effect upon the right frontal lobe superiorly, stable. There is no acute subdural hematoma. There is no epidural fluid. There is no  intra-axial  mass, hemorrhage, midline shift.  There is evidence of a prior infarct in the posterior left frontal lobe. There is also evidence of prior infarct involving a portion of the left temporal lobe anteriorly. There is evidence of a focal infarct at the left frontal -parietal junction which has shown evolution compared to the previous study. Moderate periventricular small vessel disease is stable. No acute appearing infarct is seen on this examination.  The bony calvarium appears intact except for a burr hole in the right frontal bone, a stable finding. Mastoid air cells bilaterally are clear.  IMPRESSION: The atrophy with prior infarcts on the left. An infarct at the left frontal -parietal junction shows evolution compared to the previous study. There is moderate periventricular small vessel disease. No acute infarct.  There is an essentially stable right frontal subdural hygroma which causes some mild impression on the superior right frontal lobe, stable. There is no acute appearing subdural or epidural fluid collection. There is no acute appearing hemorrhage or midline shift.   Electronically Signed   By: Lowella Grip M.D.   On: 01/24/2014 15:18    CBC  Recent Labs Lab 01/24/14 1454 01/25/14 0235  WBC 9.5 9.8  HGB 15.3 15.4  HCT 44.4 45.3  PLT 198 161  MCV 94.7 97.2  MCH 32.6 33.0  MCHC 34.5 34.0  RDW 13.7 13.9  LYMPHSABS 3.1  --   MONOABS 0.5  --   EOSABS 0.1  --   BASOSABS 0.0  --     Chemistries   Recent Labs Lab 01/24/14 1454 01/24/14 2132 01/25/14 0235  NA 134*  --  133*  K 4.9  --  4.4  CL 98  --  101  CO2 26  --  19  GLUCOSE 242*  --  206*  BUN 16  --  14  CREATININE 0.85  --  0.68  CALCIUM 9.3  --  9.3  MG  --  2.0  --    ------------------------------------------------------------------------------------------------------------------ estimated creatinine clearance is 89.6 ml/min (by C-G formula based on Cr of  0.68). ------------------------------------------------------------------------------------------------------------------  Recent Labs  01/24/14 2132  HGBA1C 8.2*   ------------------------------------------------------------------------------------------------------------------ No results found for this basename: CHOL, HDL, LDLCALC, TRIG, CHOLHDL, LDLDIRECT,  in the last 72 hours ------------------------------------------------------------------------------------------------------------------  Recent Labs  01/24/14 2132  TSH 2.480   ------------------------------------------------------------------------------------------------------------------ No results found for this basename: VITAMINB12, FOLATE, FERRITIN, TIBC, IRON, RETICCTPCT,  in the last 72 hours  Coagulation profile No results found for this basename: INR, PROTIME,  in the last 168 hours  No results found for this basename: DDIMER,  in the last 72 hours  Cardiac Enzymes  Recent Labs Lab 01/24/14 2132 01/25/14 0235 01/25/14 0844  TROPONINI <0.30 <0.30 <0.30   ------------------------------------------------------------------------------------------------------------------ No components found with this basename: POCBNP,      Time Spent in minutes   35   SINGH,PRASHANT K M.D on 01/26/2014 at 9:54 AM  Between 7am to 7pm - Pager - [phone]  After 7pm go to [email or web address] - password [email or web address]  And look for the night coverage person covering for me after hours  [facility] Office  [phone]   **Disclaimer: This note may have been dictated with voice recognition software. Similar sounding words can inadvertently be transcribed and this note may contain transcription errors which may not have been corrected upon publication of note.**

## 2014-01-26 NOTE — Progress Notes (Signed)
NEURO HOSPITALIST PROGRESS NOTE   SUBJECTIVE:                                                                                                                        No new neurological developments reported.  The mild twitching of the left lower face is not present at the time of this assessment. No further jerking movements left side. On keppra 1 gram BID. EEG pending. OBJECTIVE:                                                                                                                           Vital signs in last 24 hours: Temp:  [97.2 F (36.2 C)-98.3 F (36.8 C)] 97.2 F (36.2 C) (06/21 0600) Pulse Rate:  [76-88] 80 (06/21 0600) Resp:  [20] 20 (06/21 0600) BP: (117-141)/(60-78) 117/72 mmHg (06/21 0600) SpO2:  [96 %-100 %] 98 % (06/21 0600) Weight:  [95.119 kg (209 lb 11.2 oz)] 95.119 kg (209 lb 11.2 oz) (06/21 0450)  Intake/Output from previous day: 06/20 0701 - 06/21 0700 In: 920 [P.O.:620; IV Piggyback:300] Out: 1475 [Urine:1475] Intake/Output this shift:   Nutritional status: Dysphagia  Past Medical History  Diagnosis Date  . Diabetes mellitus   . Hypertension   . Testicular cancer dx'd 08/2010    surg only  . CHF (congestive heart failure)   . CAD (coronary artery disease)     s/p stent to OM1 remotely, DES to mRCA 2006  . Peripheral vascular disease   . Vitamin B12 deficiency   . Diabetic retinopathy   . Diabetic peripheral neuropathy   . Cholelithiasis   . Family history of colon cancer     brother   . 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 Osteomyelitis of finger of right hand 03/2013    R middle finger  . Pressure ulcer of buttock 03/2013    stage 2  . Dysphagia     pt eats a mechanical diet     Neurologic Exam:  Alert. Disoriented to year-month-place and situation. Answers " yes" and " no " to my questions. Follows simple commands.  Cranial Nerves:  II: Discs flat bilaterally; Does not blink to confrontation from the  right, reactive to light and accommodation  III,IV, VI:  ptosis not present, extra-ocular motions intact bilaterally  V,VII: decrease in the left NLF, facial light touch sensation decreased on the right  VIII: hearing normal bilaterally  IX,X: gag reflex present  XI: shoulder shrug decreased on the right  XII: midline tongue extension  Motor:  Increased tone on the right. Patient 1-2/5 on the right. Able to lift the left upper and lower extremity off the bed  Sensory: Pinprick and light touch decreased on the right  Deep Tendon Reflexes: 1+ in the upper extremities and absent at the knees.  Plantars:  Bilateral BKA  Cerebellar:  Unable to perform    Lab Results: Lab Results  Component Value Date/Time   CHOL 92 12/17/2013  6:00 AM   Lipid Panel No results found for this basename: CHOL, TRIG, HDL, CHOLHDL, VLDL, LDLCALC,  in the last 72 hours  Studies/Results: Ct Angio Head W/cm &/or Wo Cm  01/24/2014   CLINICAL DATA:  Altered mental status. History of diabetes, hypertension, testicular cancer  EXAM: CT ANGIOGRAPHY HEAD  TECHNIQUE: Multidetector CT imaging of the head was performed using the standard protocol during bolus administration of intravenous contrast. Multiplanar CT image reconstructions and MIPs were obtained to evaluate the vascular anatomy.  CONTRAST:  57mL OMNIPAQUE IOHEXOL 350 MG/ML SOLN  COMPARISON:  CT head earlier today.  CT head 12/21/2013  FINDINGS: Right frontal burr hole is noted. Postcontrast imaging reveals enhancement of the dura on the right. This is predominately in the right frontal lobe but extends into the temporal parietal lobe as well. No enhancing intra-axial mass lesion.  Chronic left MCA infarct. No acute infarct. Generalized atrophy with ventricular enlargement.  Severe intracranial atherosclerotic disease.  Calcified severe stenosis of the distal vertebral artery bilaterally. Both vertebral arteries are diseased but contribute to the basilar. There is  severe disease in the AICA bilaterally. The basilar is diseased without significant stenosis. Diffuse moderate disease in the posterior cerebral arteries bilaterally.  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 Right internal carotid artery is diseased with calcification. There is severe atherosclerotic calcification in the cavernous carotid bilaterally.  There is small caliber left M1 segment which is diffusely diseased. Left middle cerebral artery branches are patent but diffusely diseased with small caliber due to low flow. Right M1 segment is diffusely disease. Anterior cerebral arteries are diffusely disease.  Negative for cerebral aneurysm.  Review of the MIP images confirms the above findings.  IMPRESSION: Severe intracranial atherosclerotic disease as above. Occluded left internal carotid artery. Severe disease in the left middle cerebral artery with chronic left MCA infarct. No acute infarct.  Chronic burr hole in the right frontal bone. There is diffuse dural thickening and enhancement on the right which may be related to prior surgery. This could also be seen with chronic subdural hematoma, infection, and tumor. Correlate with any history of intracranial tumor. Given the burr hole, this may be a subacute subdural hematoma which enhances.   Electronically Signed   By: Franchot Gallo M.D.   On: 01/24/2014 18:20   Dg Chest 2 View  01/24/2014   CLINICAL DATA:  Altered mental status ; history of diabetes and CHF  EXAM: CHEST  2 VIEW  COMPARISON:  Portable chest x-ray of Dec 20, 2013. And Dec 16, 2013.  FINDINGS: The lungs remain hypoinflated. There are coarse lung markings at the left lung base that are slightly more conspicuous today. Density in the right perihilar region is less conspicuous but this may be due to changes in positioning. There is  no pleural effusion. The cardiac silhouette is mildly enlarged. The pulmonary vascularity is not engorged. The bony thorax is unremarkable.  IMPRESSION: Increased  density at the left lung bases consistent with atelectasis or early pneumonia. There is no evidence of CHF.   Electronically Signed   By: David  Martinique   On: 01/24/2014 15:39   Ct Head Wo Contrast  01/24/2014   CLINICAL DATA:  Confusion and slurred speech  EXAM: CT HEAD WITHOUT CONTRAST  TECHNIQUE: Contiguous axial images were obtained from the base of the skull through the vertex without intravenous contrast.  COMPARISON:  Dec 21, 2013  FINDINGS: There is mild diffuse atrophy. There is extra-axial fluid over the right frontal convexity consistent with a prior subdural hematoma with residual fluid in this area. The amount of fluid in this area appears essentially stable. There is some mild mass-effect upon the right frontal lobe superiorly, stable. There is no acute subdural hematoma. There is no epidural fluid. There is no intra-axial mass, hemorrhage, midline shift.  There is evidence of a prior infarct in the posterior left frontal lobe. There is also evidence of prior infarct involving a portion of the left temporal lobe anteriorly. There is evidence of a focal infarct at the left frontal -parietal junction which has shown evolution compared to the previous study. Moderate periventricular small vessel disease is stable. No acute appearing infarct is seen on this examination.  The bony calvarium appears intact except for a burr hole in the right frontal bone, a stable finding. Mastoid air cells bilaterally are clear.  IMPRESSION: The atrophy with prior infarcts on the left. An infarct at the left frontal -parietal junction shows evolution compared to the previous study. There is moderate periventricular small vessel disease. No acute infarct.  There is an essentially stable right frontal subdural hygroma which causes some mild impression on the superior right frontal lobe, stable. There is no acute appearing subdural or epidural fluid collection. There is no acute appearing hemorrhage or midline shift.    Electronically Signed   By: Lowella Grip M.D.   On: 01/24/2014 15:18    MEDICATIONS                                                                                                                        Scheduled: . amoxicillin-clavulanate  1 tablet Oral Q12H  . aspirin  81 mg Oral Daily  . atorvastatin  20 mg Oral QHS  . carvedilol  3.125 mg Oral BID WC  . clopidogrel  75 mg Oral Daily  . darifenacin  7.5 mg Oral Daily  . enoxaparin (LOVENOX) injection  40 mg Subcutaneous QHS  . escitalopram  10 mg Oral Daily  . ezetimibe  10 mg Oral QPM  . feeding supplement (ENSURE)  1 Container Oral TID BM  . insulin aspart  0-9 Units Subcutaneous TID WC  . insulin glargine  4 Units Subcutaneous QHS  . levETIRAcetam  1,000 mg Oral BID  . levETIRAcetam  500 mg Oral Once  . oxybutynin  5 mg Oral QHS  . sodium chloride  3 mL Intravenous Q12H  . tamsulosin  0.4 mg Oral Daily    ASSESSMENT/PLAN:                                                                                                            80 y.o. male with a history of right frontal SDH and left MCA distribution ischemic stroke admitted with probable left focal motor seizures that seem to be better controlled on increased dose of keppra. No further paroxysmal events concerning for focal seizures in the last 24 hours. EEG is pending but this won't be feasible today and he can certainly get it done as outpatient. Continue keppra.  Dorian Pod, MD [facility] [phone]  01/26/2014, 7:48 AM

## 2014-01-26 NOTE — Progress Notes (Signed)
Neurologist made aware of pt's continued hematuria.

## 2014-01-27 LAB — GLUCOSE, CAPILLARY: GLUCOSE-CAPILLARY: 244 mg/dL — AB (ref 70–99)

## 2014-01-27 MED ORDER — RESOURCE THICKENUP CLEAR PO POWD: ORAL | Status: DC

## 2014-01-27 MED ORDER — LEVETIRACETAM 1000 MG PO TABS: 1000.0000 mg | ORAL_TABLET | Freq: Two times a day (BID) | ORAL | Status: DC

## 2014-01-27 MED ORDER — ENSURE PUDDING PO PUDG: 1.0000 | Freq: Three times a day (TID) | ORAL | Status: DC

## 2014-01-27 NOTE — Clinical Social Work Psychosocial (Signed)
Clinical Social Work Department BRIEF PSYCHOSOCIAL ASSESSMENT 01/27/2014  Patient:  [name], [name]     Account Number:  [id number]     Admit date:  01/24/2014  Clinical Social Worker:  Delrae Sawyers  Date/Time:  01/27/2014 10:08 AM  Referred by:  Physician  Date Referred:  01/27/2014 Referred for  SNF Placement   Other Referral:   none.   Interview type:  Family Other interview type:   CSW spoke with pt's son, Logen Heintzelman.    PSYCHOSOCIAL DATA Living Status:  FACILITY Admitted from facility:  [facility] Level of care:  [place] Primary support name:  Larenz Frasier Primary support relationship to patient:  CHILD, ADULT Degree of support available:   Strong support system.    CURRENT CONCERNS Current Concerns  Post-Acute Placement   Other Concerns:   none.    SOCIAL WORK ASSESSMENT / PLAN CSW received consult for pt to return to [facility] at time of discharge. CSW spoke with pt's son, Demico Ploch, regarding discharge disposition. Pt's son stated family would prefer for pt to return to [facility] at time of discharge. CSW to continue to follow and assist with discharge planning needs.   Assessment/plan status:  Psychosocial Support/Ongoing Assessment of Needs Other assessment/ plan:   none.   Information/referral to community resources:   Pt returning to [employer].    PATIENT'S/FAMILY'S RESPONSE TO PLAN OF CARE: Pt and pt's family understanding and agreeable to CSW plan of care. Pt's family currently has no further questions or concerns.       Lubertha Sayres, MSW, [facility] Licensed Clinical Social Worker [phone] and [phone]

## 2014-01-27 NOTE — Discharge Summary (Signed)
[name], is a 80 y.o. male  DOB [birth year]  MRN [id number].  Admission date:  01/24/2014  Admitting Physician  Theodis Blaze, MD  Discharge Date:  01/27/2014   Primary MD  Garwin Brothers, MD  Recommendations for primary care physician for things to follow:   Please make sure patient follows with his primary neurologist within a week   Admission Diagnosis  Blood in stool [578.1] Abdominal pain, left upper quadrant [789.02] Ischemic cardiomyopathy [414.8] Dysphagia [787.20] Transient cerebral ischemia, unspecified transient cerebral ischemia type [435.9]   Discharge Diagnosis  Blood in stool [578.1] Abdominal pain, left upper quadrant [789.02] Ischemic cardiomyopathy [414.8] Dysphagia [787.20] Transient cerebral ischemia, unspecified transient cerebral ischemia type [435.9]    Active Problems:    Altered mental state   Seizures      Past Medical History  Diagnosis Date  . Diabetes mellitus   . Hypertension   . Testicular cancer dx'd 08/2010    surg only  . CHF (congestive heart failure)   . CAD (coronary artery disease)     s/p stent to OM1 remotely, DES to mRCA 2006  . Peripheral vascular disease   . Vitamin B12 deficiency   . Diabetic retinopathy   . Diabetic peripheral neuropathy   . Cholelithiasis   . Family history of colon cancer     brother   . 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 Osteomyelitis of finger of right hand 03/2013    R middle finger  . Pressure ulcer of buttock 03/2013    stage 2  . Dysphagia     pt eats a mechanical diet     Past Surgical History  Procedure Laterality Date  . Below knee leg amputation  2005    Right  . Below knee leg amputation  2006    Left  . Appendectomy    . Eye surgery  2012    Laser  . Ptca    . Trudee Kuster hole Right 03/04/2013    Procedure: Haskell Flirt;  Surgeon: Charlie Pitter, MD;  Location: [facility];  Service: Neurosurgery;  Laterality: Right;  Burr Holes   . Tee without cardioversion N/A 12/20/2013    Procedure: TRANSESOPHAGEAL ECHOCARDIOGRAM (TEE);  Surgeon: Lelon Perla, MD;  Location: [facility] END[facility];  Service: Cardiovascular;  Laterality: N/A;     Discharge Condition: stable   Follow UP  Follow-up Information   Follow up with AMIN, SAAD, MD. Schedule an appointment as soon as possible for a visit in 1 week.   Specialty:  Internal Medicine   Contact information:   [address] [place] [address] [phone]       Follow up with [facility]. Schedule an appointment as soon as possible for a visit in 1 week.   Contact information:   [address]     [place] [address] [phone]        Discharge Instructions  and  Discharge Medications         Discharge  Instructions   Discharge instructions    Complete by:  As directed   Follow with Primary MD AMIN, SAAD, MD in 7 days, monitor bladder emptying closely  Get CBC, CMP, 2 view Chest X ray checked  by Primary MD next visit.    Activity: As tolerated with Full fall precautions use walker/cane & assistance as needed   Disposition SNF   Diet: Dysphagia 3 diet with nectar thick liquids Heart Healthy low carb with feeding assistance and aspiration precautions .  For Heart failure patients - Check your Weight same time everyday, if you gain over 2 pounds, or you develop in leg swelling, experience more shortness of breath or chest pain, call your Primary MD immediately. Follow Cardiac Low Salt Diet and 1.8 lit/day fluid restriction.   On your next visit with her primary care physician please Get Medicines reviewed and adjusted.  Please request your Prim.MD to go over all Hospital Tests and Procedure/Radiological results at the follow up, please get all Hospital records sent to your Prim MD by signing hospital release before you go  home.   If you experience worsening of your admission symptoms, develop shortness of breath, life threatening emergency, suicidal or homicidal thoughts you must seek medical attention immediately by calling 911 or calling your MD immediately  if symptoms less severe.  You Must read complete instructions/literature along with all the possible adverse reactions/side effects for all the Medicines you take and that have been prescribed to you. Take any new Medicines after you have completely understood and accpet all the possible adverse reactions/side effects.   Do not drive and provide baby sitting services if your were admitted for syncope or siezures until you have seen by Primary MD or a Neurologist and advised to do so again.  Do not drive when taking Pain medications.    Do not take more than prescribed Pain, Sleep and Anxiety Medications  Special Instructions: If you have smoked or chewed Tobacco  in the last 2 yrs please stop smoking, stop any regular Alcohol  and or any Recreational drug use.  Wear Seat belts while driving.   Please note  You were cared for by a hospitalist during your hospital stay. If you have any questions about your discharge medications or the care you received while you were in the hospital after you are discharged, you can call the unit and asked to speak with the hospitalist on call if the hospitalist that took care of you is not available. Once you are discharged, your primary care physician will handle any further medical issues. Please note that NO REFILLS for any discharge medications will be authorized once you are discharged, as it is imperative that you return to your primary care physician (or establish a relationship with a primary care physician if you do not have one) for your aftercare needs so that they can reassess your need for medications and monitor your lab values.  Follow with Primary MD AMIN, SAAD, MD in 7 days, monitor bladder emptying  closely  Get CBC, CMP, 2 view Chest X ray checked  by Primary MD next visit.    Activity: As tolerated with Full fall precautions use walker/cane & assistance as needed   Disposition SNF   Diet: Dysphagia 3 diet with nectar thick liquids Heart Healthy low carb with feeding assistance and aspiration precautions .  For Heart failure patients - Check your Weight same time everyday, if you gain over 2 pounds, or you develop in leg swelling,  experience more shortness of breath or chest pain, call your Primary MD immediately. Follow Cardiac Low Salt Diet and 1.8 lit/day fluid restriction.   On your next visit with her primary care physician please Get Medicines reviewed and adjusted.  Please request your Prim.MD to go over all Hospital Tests and Procedure/Radiological results at the follow up, please get all Hospital records sent to your Prim MD by signing hospital release before you go home.   If you experience worsening of your admission symptoms, develop shortness of breath, life threatening emergency, suicidal or homicidal thoughts you must seek medical attention immediately by calling 911 or calling your MD immediately  if symptoms less severe.  You Must read complete instructions/literature along with all the possible adverse reactions/side effects for all the Medicines you take and that have been prescribed to you. Take any new Medicines after you have completely understood and accpet all the possible adverse reactions/side effects.   Do not drive and provide baby sitting services if your were admitted for syncope or siezures until you have seen by Primary MD or a Neurologist and advised to do so again.  Do not drive when taking Pain medications.    Do not take more than prescribed Pain, Sleep and Anxiety Medications  Special Instructions: If you have smoked or chewed Tobacco  in the last 2 yrs please stop smoking, stop any regular Alcohol  and or any Recreational drug use.  Wear  Seat belts while driving.   Please note  You were cared for by a hospitalist during your hospital stay. If you have any questions about your discharge medications or the care you received while you were in the hospital after you are discharged, you can call the unit and asked to speak with the hospitalist on call if the hospitalist that took care of you is not available. Once you are discharged, your primary care physician will handle any further medical issues. Please note that NO REFILLS for any discharge medications will be authorized once you are discharged, as it is imperative that you return to your primary care physician (or establish a relationship with a primary care physician if you do not have one) for your aftercare needs so that they can reassess your need for medications and monitor your lab values.     Increase activity slowly    Complete by:  As directed             Medication List         aspirin 81 MG EC tablet  Take 1 tablet (81 mg total) by mouth daily.     atorvastatin 20 MG tablet  Commonly known as:  LIPITOR  Take 20 mg by mouth at bedtime.     carvedilol 3.125 MG tablet  Commonly known as:  COREG  Take 1 tablet (3.125 mg total) by mouth 2 (two) times daily with a meal.     clopidogrel 75 MG tablet  Commonly known as:  PLAVIX  Take 1 tablet (75 mg total) by mouth daily with breakfast.     escitalopram 10 MG tablet  Commonly known as:  LEXAPRO  Take 10 mg by mouth daily.     ezetimibe 10 MG tablet  Commonly known as:  ZETIA  Take 10 mg by mouth every evening.     feeding supplement (ENSURE) Pudg  Take 1 Container by mouth 3 (three) times daily between meals.     insulin aspart 100 UNIT/ML injection  Commonly known as:  novoLOG  - 0-9 Units, Subcutaneous, 3 times daily with meals  - CBG < 70: implement hypoglycemia protocol  - CBG 70 - 120: 0 units  - CBG 121 - 150: 1 unit  - CBG 151 - 200: 2 units  - CBG 201 - 250: 3 units  - CBG 251 -  300: 5 units  - CBG 301 - 350: 7 units  - CBG 351 - 400: 9 units  - CBG > 400: call MD     insulin glargine 100 UNIT/ML injection  Commonly known as:  LANTUS  Inject 8 Units into the skin daily with breakfast.     levETIRAcetam 1000 MG tablet  Commonly known as:  KEPPRA  Take 1 tablet (1,000 mg total) by mouth 2 (two) times daily.     lisinopril 2.5 MG tablet  Commonly known as:  PRINIVIL,ZESTRIL  Take 1 tablet (2.5 mg total) by mouth daily.     oxybutynin 5 MG 24 hr tablet  Commonly known as:  DITROPAN-XL  Take 5 mg by mouth at bedtime.     RESOURCE THICKENUP CLEAR Powd  Use thickening liquids as needed     solifenacin 5 MG tablet  Commonly known as:  VESICARE  Take 5 mg by mouth daily.     tamsulosin 0.4 MG Caps capsule  Commonly known as:  FLOMAX  Take 0.4 mg by mouth daily.     Vitamin D (Ergocalciferol) 50000 UNITS Caps capsule  Commonly known as:  DRISDOL  Take 50,000 Units by mouth every 7 (seven) days. On Wednesday          Diet and Activity recommendation: See Discharge Instructions above   Consults obtained - Neuro   Major procedures and Radiology Reports - PLEASE review detailed and final reports for all details, in brief -   EEG pending   Ct Angio Head W/cm &/or Wo Cm  01/24/2014   CLINICAL DATA:  Altered mental status. History of diabetes, hypertension, testicular cancer  EXAM: CT ANGIOGRAPHY HEAD  TECHNIQUE: Multidetector CT imaging of the head was performed using the standard protocol during bolus administration of intravenous contrast. Multiplanar CT image reconstructions and MIPs were obtained to evaluate the vascular anatomy.  CONTRAST:  68mL OMNIPAQUE IOHEXOL 350 MG/ML SOLN  COMPARISON:  CT head earlier today.  CT head 12/21/2013  FINDINGS: Right frontal burr hole is noted. Postcontrast imaging reveals enhancement of the dura on the right. This is predominately in the right frontal lobe but extends into the temporal parietal lobe as well. No  enhancing intra-axial mass lesion.  Chronic left MCA infarct. No acute infarct. Generalized atrophy with ventricular enlargement.  Severe intracranial atherosclerotic disease.  Calcified severe stenosis of the distal vertebral artery bilaterally. Both vertebral arteries are diseased but contribute to the basilar. There is severe disease in the AICA bilaterally. The basilar is diseased without significant stenosis. Diffuse moderate disease in the posterior cerebral arteries bilaterally.  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 Right internal carotid artery is diseased with calcification. There is severe atherosclerotic calcification in the cavernous carotid bilaterally.  There is small caliber left M1 segment which is diffusely diseased. Left middle cerebral artery branches are patent but diffusely diseased with small caliber due to low flow. Right M1 segment is diffusely disease. Anterior cerebral arteries are diffusely disease.  Negative for cerebral aneurysm.  Review of the MIP images confirms the above findings.  IMPRESSION: Severe intracranial atherosclerotic disease as above. Occluded left internal carotid artery. Severe disease in the  left middle cerebral artery with chronic left MCA infarct. No acute infarct.  Chronic burr hole in the right frontal bone. There is diffuse dural thickening and enhancement on the right which may be related to prior surgery. This could also be seen with chronic subdural hematoma, infection, and tumor. Correlate with any history of intracranial tumor. Given the burr hole, this may be a subacute subdural hematoma which enhances.   Electronically Signed   By: Franchot Gallo M.D.   On: 01/24/2014 18:20   Dg Chest 2 View  01/24/2014   CLINICAL DATA:  Altered mental status ; history of diabetes and CHF  EXAM: CHEST  2 VIEW  COMPARISON:  Portable chest x-ray of Dec 20, 2013. And Dec 16, 2013.  FINDINGS: The lungs remain hypoinflated. There are coarse lung markings at the left lung  base that are slightly more conspicuous today. Density in the right perihilar region is less conspicuous but this may be due to changes in positioning. There is no pleural effusion. The cardiac silhouette is mildly enlarged. The pulmonary vascularity is not engorged. The bony thorax is unremarkable.  IMPRESSION: Increased density at the left lung bases consistent with atelectasis or early pneumonia. There is no evidence of CHF.   Electronically Signed   By: David  Martinique   On: 01/24/2014 15:39   Ct Head Wo Contrast  01/24/2014   CLINICAL DATA:  Confusion and slurred speech  EXAM: CT HEAD WITHOUT CONTRAST  TECHNIQUE: Contiguous axial images were obtained from the base of the skull through the vertex without intravenous contrast.  COMPARISON:  Dec 21, 2013  FINDINGS: There is mild diffuse atrophy. There is extra-axial fluid over the right frontal convexity consistent with a prior subdural hematoma with residual fluid in this area. The amount of fluid in this area appears essentially stable. There is some mild mass-effect upon the right frontal lobe superiorly, stable. There is no acute subdural hematoma. There is no epidural fluid. There is no intra-axial mass, hemorrhage, midline shift.  There is evidence of a prior infarct in the posterior left frontal lobe. There is also evidence of prior infarct involving a portion of the left temporal lobe anteriorly. There is evidence of a focal infarct at the left frontal -parietal junction which has shown evolution compared to the previous study. Moderate periventricular small vessel disease is stable. No acute appearing infarct is seen on this examination.  The bony calvarium appears intact except for a burr hole in the right frontal bone, a stable finding. Mastoid air cells bilaterally are clear.  IMPRESSION: The atrophy with prior infarcts on the left. An infarct at the left frontal -parietal junction shows evolution compared to the previous study. There is moderate  periventricular small vessel disease. No acute infarct.  There is an essentially stable right frontal subdural hygroma which causes some mild impression on the superior right frontal lobe, stable. There is no acute appearing subdural or epidural fluid collection. There is no acute appearing hemorrhage or midline shift.   Electronically Signed   By: Lowella Grip M.D.   On: 01/24/2014 15:18    Micro Results      Recent Results (from the past 240 hour(s))  URINE CULTURE     Status: None   Collection Time    01/24/14  4:34 PM      Result Value Ref Range Status   Specimen Description URINE, CATHETERIZED   Final   Special Requests NONE   Final   Culture  Setup Time  Final   Value: 01/24/2014 18:29     Performed at [facility]     Final   Value: NO GROWTH     Performed at [facility]   Culture     Final   Value: NO GROWTH     Performed at [facility]   Report Status 01/25/2014 FINAL   Final  CULTURE, BLOOD (ROUTINE X 2)     Status: None   Collection Time    01/24/14  9:32 PM      Result Value Ref Range Status   Specimen Description BLOOD LEFT FOREARM   Final   Special Requests BOTTLES DRAWN AEROBIC ONLY 10CC   Final   Culture  Setup Time     Final   Value: 01/25/2014 00:46     Performed at [facility]   Culture     Final   Value:        BLOOD CULTURE RECEIVED NO GROWTH TO DATE CULTURE WILL BE HELD FOR 5 DAYS BEFORE ISSUING A FINAL NEGATIVE REPORT     Performed at [facility]   Report Status PENDING   Incomplete  CULTURE, BLOOD (ROUTINE X 2)     Status: None   Collection Time    01/24/14  9:39 PM      Result Value Ref Range Status   Specimen Description BLOOD RIGHT HAND   Final   Special Requests BOTTLES DRAWN AEROBIC ONLY [place]   Final   Culture  Setup Time     Final   Value: 01/25/2014 00:46     Performed at [facility]   Culture     Final   Value:        BLOOD CULTURE RECEIVED NO GROWTH TO DATE  CULTURE WILL BE HELD FOR 5 DAYS BEFORE ISSUING A FINAL NEGATIVE REPORT     Performed at [facility]   Report Status PENDING   Incomplete     History of present illness and  Hospital Course:     Kindly see H&P for history of present illness and admission details, please review complete Labs, Consult reports and Test reports for all details in brief [name], is a 80 y.o. male, patient with history of   history of CVA with dense right-sided hemiparesis, continued expressive aphasia, bilateral BKA, poorly controlled type 2 diabetes mellitus with diabetic peripheral neuropathy and retinopathy, hypertension, dyslipidemia, CAD, PAD, underlying seizures was brought in to the hospital from nursing home for decreased mental status which likely appears to be secondary to a breakthrough seizure. He was seen by neurology his Keppra dose has been increased he is now currently to baseline and will be transferred back to the nursing home.      Altered mental status likely secondary to seizures versus encephalopathy due to progression of previous stroke in the left MCA territory   Has dense right-sided hemiparesis, continue aspirin - Plavix, statin for secondary prevention along with statin.  Neurology following, currently on Keppra at double the home dose, EEG pending we'll request following with primary neurologist within a week. Appears to be at baseline and seizure free. No further neurological testing per neurology.     History of strokes, left MCA (recent stroke) with dense right-sided hemiparesis expressive aphasia and dysphagia.   Seen by neurology continue on aspirin Plavix and statin for secondary prevention. On dysphagia 3 diet with nectar thick liquids, tinea to follow aspiration precautions will need to  be seen by speech on a close       Functional quadriplegia  - secondary to previous stroke with dense right-sided hemiparesis and expressive aphasia along with, bilateral  BKA , quite supportive care.      ? PNA(HCAP)  - Not convinced clinically, chest x-ray stable stop antibiotics continue aspiration precautions.     Diabetes mellitus in poor control with complications of neuropathy and retinopathy   - Discharge him home regimen we'll request nursing home and he to kindly monitor CBGs A1c and adjust insulin dose as needed the  Lab Results   Component  Value  Date    HGBA1C  8.2*  01/24/2014    CBG (last 3)   Recent Labs   01/25/14 1622  01/25/14 2115  01/26/14 0641   GLUCAP  225*  264*  208*       HTN - continue Coreg and monitor     Severe CAD  - Continue aspirin - plavix, Coreg and statin along with plavix for secondary prevention     Severe malnutrition  - Placed on protein supplements      BPH with traumatic hematuria  Continue home medications which include Flomax along with Ditropan XL, urine has cleared Foley will be removed please monitor bladder emptying and urine output       Today   Subjective:   [name] today has no headache,no chest abdominal pain,no new weakness tingling or numbness, feels much better .  Objective:   Blood pressure 128/65, pulse 70, temperature 97.5 F (36.4 C), temperature source Oral, resp. rate 16, height 5\' 11"  (1.803 m), weight 94.121 kg (207 lb 8 oz), SpO2 96.00%.   Intake/Output Summary (Last 24 hours) at 01/27/14 1013 Last data filed at 01/27/14 0400  Gross per 24 hour  Intake    618 ml  Output    600 ml  Net     18 ml    Exam Awake Alert, does have expressive aphasia at baseline has difficulty following commands and answer questions reliably, No new F.N deficits,   [place].AT,PERRAL Supple Neck,No JVD, No cervical lymphadenopathy appriciated.  Symmetrical Chest wall movement, Good air movement bilaterally, CTAB RRR,No Gallops,Rubs or new Murmurs, No Parasternal Heave +ve B.Sounds, Abd Soft, Non tender, No organomegaly appriciated, No rebound -guarding or  rigidity. No Cyanosis, Clubbing or edema, No new Rash or bruise, bilateral BKA  Data Review   CBC w Diff: Lab Results  Component Value Date   WBC 9.8 01/25/2014   WBC 8.8 02/12/2013   HGB 15.4 01/25/2014   HGB 13.2 02/12/2013   HCT 45.3 01/25/2014   HCT 39.2 02/12/2013   PLT 161 01/25/2014   PLT 224 02/12/2013   LYMPHOPCT 33 01/24/2014   LYMPHOPCT 26.1 02/12/2013   MONOPCT 6 01/24/2014   MONOPCT 5.7 02/12/2013   EOSPCT 2 01/24/2014   EOSPCT 4.9 02/12/2013   BASOPCT 0 01/24/2014   BASOPCT 0.8 02/12/2013    CMP: Lab Results  Component Value Date   NA 133* 01/25/2014   NA 138 02/12/2013   NA 142 01/26/2012   K 4.4 01/25/2014   K 3.8 02/12/2013   K 5.5* 01/26/2012   CL 101 01/25/2014   CL 107 08/17/2012   CL 102 01/26/2012   CO2 19 01/25/2014   CO2 26 02/12/2013   CO2 29 01/26/2012   BUN 14 01/25/2014   BUN 14.6 02/12/2013   BUN 14 01/26/2012   CREATININE 0.68 01/25/2014   CREATININE 1.01 04/25/2013  CREATININE 1.1 02/12/2013   PROT 6.9 12/16/2013   PROT 6.2* 02/12/2013   PROT 7.7 01/26/2012   ALBUMIN 3.0* 12/16/2013   ALBUMIN 2.7* 02/12/2013   BILITOT 0.5 12/16/2013   BILITOT 0.34 02/12/2013   BILITOT 0.60 01/26/2012   ALKPHOS 113 12/16/2013   ALKPHOS 105 02/12/2013   ALKPHOS 112* 01/26/2012   AST 11 12/16/2013   AST 9 02/12/2013   AST 17 01/26/2012   ALT 8 12/16/2013   ALT 8 02/12/2013   ALT 11 01/26/2012  . Lab Results  Component Value Date   HGBA1C 8.2* 01/24/2014    Lab Results  Component Value Date   CHOL 92 12/17/2013   HDL 40 12/17/2013   LDLCALC 40 12/17/2013   TRIG 61 12/17/2013   CHOLHDL 2.3 12/17/2013     Total Time in preparing paper work, data evaluation and todays exam - 35 minutes  Thurnell Lose M.D on 01/27/2014 at 10:13 AM  [facility] Office  [phone]   **Disclaimer: This note may have been dictated with voice recognition software. Similar sounding words can inadvertently be transcribed and this note may contain transcription errors which may not have been corrected upon  publication of note.**

## 2014-01-27 NOTE — Progress Notes (Addendum)
NEURO HOSPITALIST PROGRESS NOTE   SUBJECTIVE:                                                                                                                        Uneventful night. No further seizure like activity noted. On keppra 1 gram BID. EEG pending.  OBJECTIVE:                                                                                                                           Vital signs in last 24 hours: Temp:  [97.5 F (36.4 C)-98.7 F (37.1 C)] 97.5 F (36.4 C) (06/22 0549) Pulse Rate:  [73-87] 73 (06/22 0549) Resp:  [16-18] 16 (06/22 0549) BP: (104-119)/(60-67) 111/67 mmHg (06/22 0549) SpO2:  [95 %-98 %] 96 % (06/22 0549) Weight:  [94.121 kg (207 lb 8 oz)] 94.121 kg (207 lb 8 oz) (06/22 0500)  Intake/Output from previous day: 06/21 0701 - 06/22 0700 In: 48 [P.O.:338] Out: 600 [Urine:600] Intake/Output this shift:   Nutritional status: Dysphagia  Past Medical History  Diagnosis Date  . Diabetes mellitus   . Hypertension   . Testicular cancer dx'd 08/2010    surg only  . CHF (congestive heart failure)   . CAD (coronary artery disease)     s/p stent to OM1 remotely, DES to mRCA 2006  . Peripheral vascular disease   . Vitamin B12 deficiency   . Diabetic retinopathy   . Diabetic peripheral neuropathy   . Cholelithiasis   . Family history of colon cancer     brother   . 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 Osteomyelitis of finger of right hand 03/2013    R middle finger  . Pressure ulcer of buttock 03/2013    stage 2  . Dysphagia     pt eats a mechanical diet    Neurologic Exam:  Mental status: Alert. Disoriented to year-month-place and situation. Answers " yes" and " no " to my questions. Follows simple commands.  Cranial Nerves:  II: Discs flat bilaterally; Does not blink to confrontation from the right, reactive to light and accommodation  III,IV, VI: ptosis not present, extra-ocular motions intact bilaterally  V,VII:  decrease in the left NLF, facial light touch sensation decreased on the  right  VIII: hearing normal bilaterally  IX,X: gag reflex present  XI: shoulder shrug decreased on the right  XII: midline tongue extension  Motor:  Increased tone on the right. Patient 1-2/5 on the right. Able to lift the left upper and lower extremity off the bed  Sensory: Pinprick and light touch decreased on the right  Deep Tendon Reflexes: 1+ in the upper extremities and absent at the knees.  Plantars:  Bilateral BKA  Cerebellar:  Unable to perform   Lab Results: Lab Results  Component Value Date/Time   CHOL 92 12/17/2013  6:00 AM   Lipid Panel No results found for this basename: CHOL, TRIG, HDL, CHOLHDL, VLDL, LDLCALC,  in the last 72 hours  Studies/Results: No results found.  MEDICATIONS                                                                                                                        Scheduled: . amoxicillin-clavulanate  1 tablet Oral Q12H  . aspirin  81 mg Oral Daily  . atorvastatin  20 mg Oral QHS  . carvedilol  3.125 mg Oral BID WC  . clopidogrel  75 mg Oral Daily  . darifenacin  7.5 mg Oral Daily  . enoxaparin (LOVENOX) injection  40 mg Subcutaneous QHS  . escitalopram  10 mg Oral Daily  . ezetimibe  10 mg Oral QPM  . feeding supplement (ENSURE)  1 Container Oral TID BM  . insulin aspart  0-9 Units Subcutaneous TID WC  . insulin glargine  4 Units Subcutaneous QHS  . levETIRAcetam  1,000 mg Oral BID  . oxybutynin  5 mg Oral QHS  . sodium chloride  3 mL Intravenous Q12H  . tamsulosin  0.4 mg Oral Daily    ASSESSMENT/PLAN:                                                                                                            80 y.o. male with a history of right frontal SDH and left MCA distribution ischemic stroke admitted with probable left focal motor seizures that seem to be better controlled on increased dose of keppra.  No further paroxysmal events concerning  for focal seizures in the last 48 hours. EEG pending. Continue keppra 1 gram BID.  Dorian Pod, MD [facility] [phone]  01/27/2014, 8:43 AM

## 2014-01-27 NOTE — Discharge Instructions (Signed)
Follow with Primary MD AMIN, SAAD, MD in 7 days, monitor bladder emptying closely  Get CBC, CMP, 2 view Chest X ray checked  by Primary MD next visit.    Activity: As tolerated with Full fall precautions use walker/cane & assistance as needed   Disposition SNF   Diet: Dysphagia 3 diet with nectar thick liquids Heart Healthy low carb with feeding assistance and aspiration precautions .  For Heart failure patients - Check your Weight same time everyday, if you gain over 2 pounds, or you develop in leg swelling, experience more shortness of breath or chest pain, call your Primary MD immediately. Follow Cardiac Low Salt Diet and 1.8 lit/day fluid restriction.   On your next visit with her primary care physician please Get Medicines reviewed and adjusted.  Please request your Prim.MD to go over all Hospital Tests and Procedure/Radiological results at the follow up, please get all Hospital records sent to your Prim MD by signing hospital release before you go home.   If you experience worsening of your admission symptoms, develop shortness of breath, life threatening emergency, suicidal or homicidal thoughts you must seek medical attention immediately by calling 911 or calling your MD immediately  if symptoms less severe.  You Must read complete instructions/literature along with all the possible adverse reactions/side effects for all the Medicines you take and that have been prescribed to you. Take any new Medicines after you have completely understood and accpet all the possible adverse reactions/side effects.   Do not drive and provide baby sitting services if your were admitted for syncope or siezures until you have seen by Primary MD or a Neurologist and advised to do so again.  Do not drive when taking Pain medications.    Do not take more than prescribed Pain, Sleep and Anxiety Medications  Special Instructions: If you have smoked or chewed Tobacco  in the last 2 yrs please stop  smoking, stop any regular Alcohol  and or any Recreational drug use.  Wear Seat belts while driving.   Please note  You were cared for by a hospitalist during your hospital stay. If you have any questions about your discharge medications or the care you received while you were in the hospital after you are discharged, you can call the unit and asked to speak with the hospitalist on call if the hospitalist that took care of you is not available. Once you are discharged, your primary care physician will handle any further medical issues. Please note that NO REFILLS for any discharge medications will be authorized once you are discharged, as it is imperative that you return to your primary care physician (or establish a relationship with a primary care physician if you do not have one) for your aftercare needs so that they can reassess your need for medications and monitor your lab values.

## 2014-01-27 NOTE — Progress Notes (Signed)
Discharge orders received.  Pt transported via [facility] to [facility] center.  Stable upon discharge.  Attempted to call report to [facility] x 2 attempts.  Cori Razor, RN 01/27/2014

## 2014-01-27 NOTE — Clinical Social Work Note (Signed)
Discharge packet is complete and placed on pt's shadow chart. Discharge summary provided to [facility]. CSW confirmed discharge disposition with pt's son, Piper Albro. CSW has arranged for ambulance transportation via EMS ([facility]). RN updated with information above and provided with facility number to call report.   Lubertha Sayres, MSW, [facility] Licensed Clinical Social Worker [phone] and [phone]

## 2014-01-27 NOTE — Progress Notes (Signed)
UR COMPLETED  

## 2014-01-28 ENCOUNTER — Telehealth: Payer: Self-pay | Admitting: Neurology

## 2014-01-28 NOTE — Telephone Encounter (Signed)
Tammy with [facility] @ [phone].  Per patients discharge papers, he needs to be seen within 1 week (TIA'S).  Please call and advise.

## 2014-01-28 NOTE — Telephone Encounter (Signed)
Called [name] she stated the patient is still having TIA's and seizures , [name] advised the patient go back to the hospital.

## 2014-01-31 LAB — CULTURE, BLOOD (ROUTINE X 2)
CULTURE: NO GROWTH
Culture: NO GROWTH

## 2014-02-13 ENCOUNTER — Ambulatory Visit (HOSPITAL_BASED_OUTPATIENT_CLINIC_OR_DEPARTMENT_OTHER): Payer: Medicare Other

## 2014-02-13 ENCOUNTER — Ambulatory Visit (HOSPITAL_COMMUNITY)
Admission: RE | Admit: 2014-02-13 | Discharge: 2014-02-13 | Disposition: A | Discharge status: Home / Self Care | Payer: Medicare Other | Source: Ambulatory Visit | Attending: Oncology | Admitting: Oncology

## 2014-02-13 ENCOUNTER — Telehealth: Payer: Self-pay | Admitting: Oncology

## 2014-02-13 DIAGNOSIS — C499 Malignant neoplasm of connective and soft tissue, unspecified: Secondary | ICD-10-CM

## 2014-02-13 DIAGNOSIS — I2510 Atherosclerotic heart disease of native coronary artery without angina pectoris: Secondary | ICD-10-CM | POA: Diagnosis not present

## 2014-02-13 DIAGNOSIS — C6290 Malignant neoplasm of unspecified testis, unspecified whether descended or undescended: Secondary | ICD-10-CM

## 2014-02-13 DIAGNOSIS — I700 Atherosclerosis of aorta: Secondary | ICD-10-CM | POA: Diagnosis not present

## 2014-02-13 DIAGNOSIS — N289 Disorder of kidney and ureter, unspecified: Secondary | ICD-10-CM | POA: Diagnosis not present

## 2014-02-13 DIAGNOSIS — R599 Enlarged lymph nodes, unspecified: Secondary | ICD-10-CM | POA: Insufficient documentation

## 2014-02-13 DIAGNOSIS — Z8547 Personal history of malignant neoplasm of testis: Secondary | ICD-10-CM

## 2014-02-13 DIAGNOSIS — I517 Cardiomegaly: Secondary | ICD-10-CM | POA: Diagnosis not present

## 2014-02-13 LAB — CBC WITH DIFFERENTIAL/PLATELET
BASO%: 0.8 % (ref 0.0–2.0)
Basophils Absolute: 0.1 10*3/uL (ref 0.0–0.1)
EOS ABS: 0.2 10*3/uL (ref 0.0–0.5)
EOS%: 1.6 % (ref 0.0–7.0)
HCT: 46.1 % (ref 38.4–49.9)
HGB: 15.4 g/dL (ref 13.0–17.1)
LYMPH#: 2.5 10*3/uL (ref 0.9–3.3)
LYMPH%: 24 % (ref 14.0–49.0)
MCH: 32.5 pg (ref 27.2–33.4)
MCHC: 33.4 g/dL (ref 32.0–36.0)
MCV: 97.4 fL (ref 79.3–98.0)
MONO#: 0.8 10*3/uL (ref 0.1–0.9)
MONO%: 7.6 % (ref 0.0–14.0)
NEUT%: 66 % (ref 39.0–75.0)
NEUTROS ABS: 6.9 10*3/uL — AB (ref 1.5–6.5)
PLATELETS: 223 10*3/uL (ref 140–400)
RBC: 4.74 10*6/uL (ref 4.20–5.82)
RDW: 14.4 % (ref 11.0–14.6)
WBC: 10.5 10*3/uL — ABNORMAL HIGH (ref 4.0–10.3)

## 2014-02-13 LAB — COMPREHENSIVE METABOLIC PANEL (CC13)
ALT: 40 U/L (ref 0–55)
AST: 26 U/L (ref 5–34)
Albumin: 2.8 g/dL — ABNORMAL LOW (ref 3.5–5.0)
Alkaline Phosphatase: 154 U/L — ABNORMAL HIGH (ref 40–150)
Anion Gap: 7 mEq/L (ref 3–11)
BUN: 12.2 mg/dL (ref 7.0–26.0)
CALCIUM: 9 mg/dL (ref 8.4–10.4)
CHLORIDE: 103 meq/L (ref 98–109)
CO2: 27 meq/L (ref 22–29)
Creatinine: 0.9 mg/dL (ref 0.7–1.3)
Glucose: 308 mg/dl — ABNORMAL HIGH (ref 70–140)
POTASSIUM: 4.4 meq/L (ref 3.5–5.1)
SODIUM: 137 meq/L (ref 136–145)
TOTAL PROTEIN: 6.6 g/dL (ref 6.4–8.3)
Total Bilirubin: 0.43 mg/dL (ref 0.20–1.20)

## 2014-02-13 MED ORDER — IOHEXOL 300 MG/ML  SOLN
50.0000 mL | Freq: Once | INTRAMUSCULAR | Status: AC | PRN
  Administered 2014-02-13: 50 mL via ORAL

## 2014-02-13 MED ORDER — IOHEXOL 300 MG/ML  SOLN
100.0000 mL | Freq: Once | INTRAMUSCULAR | Status: AC | PRN
Start: 2014-02-13 — End: 2014-02-13
  Administered 2014-02-13: 100 mL via INTRAVENOUS

## 2014-02-13 NOTE — Telephone Encounter (Signed)
pt camed in day early appt changed to today pt ok and aware

## 2014-02-14 ENCOUNTER — Ambulatory Visit (HOSPITAL_COMMUNITY): Payer: Medicare Other

## 2014-02-14 ENCOUNTER — Other Ambulatory Visit: Payer: Medicare Other

## 2014-02-18 ENCOUNTER — Encounter: Payer: Self-pay | Admitting: Oncology

## 2014-02-18 ENCOUNTER — Ambulatory Visit (HOSPITAL_BASED_OUTPATIENT_CLINIC_OR_DEPARTMENT_OTHER): Payer: Medicare Other | Admitting: Oncology

## 2014-02-18 VITALS — BP 139/69 | HR 75 | Temp 97.6°F | Resp 18

## 2014-02-18 DIAGNOSIS — C499 Malignant neoplasm of connective and soft tissue, unspecified: Secondary | ICD-10-CM

## 2014-02-18 DIAGNOSIS — Z8547 Personal history of malignant neoplasm of testis: Secondary | ICD-10-CM

## 2014-02-18 NOTE — Progress Notes (Signed)
Hematology and Oncology Follow Up Visit  [name] [id number] [birth year] 80 y.o. 02/18/2014 10:50 AM    CC: [name]. [name], M.D.  [name], M.D.    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   Prior Therapy: Patient status post a right scrotal and inguinal exploration and radical orchiectomy, right inguinal lymphadenectomy, pathology revealing a dedifferentiated liposarcoma, low grade.   Current therapy: Observation and surveillance.  Interim History:  [name] presents today for a followup visit with his family. Since his last visit, he has been hospitalized multiple times last of which was in June of 2015. He has suffered stroke and currently nonverbal and for the most part wheelchair-bound. He is currently residing in a skilled nursing facility. He had not reported any major complaints since last time I saw him.  Did not report any chest pain.  Did not report any shortness of breath.  Did not report any abdominal distension.  Did not report any abdominal masses.  He has  limited performance is very poor at this time. No pain in the groin at this time. So the review of systems was difficult to obtain.  Medications: I have reviewed the patient's current medications. Current Outpatient Prescriptions  Medication Sig Dispense Refill  . aspirin EC 81 MG EC tablet Take 1 tablet (81 mg total) by mouth daily.      Marland Kitchen atorvastatin (LIPITOR) 20 MG tablet Take 20 mg by mouth at bedtime.      . carvedilol (COREG) 3.125 MG tablet Take 1 tablet (3.125 mg total) by mouth 2 (two) times daily with a meal.      . clopidogrel (PLAVIX) 75 MG tablet Take 1 tablet (75 mg total) by mouth daily with breakfast.      . escitalopram (LEXAPRO) 10 MG tablet Take 10 mg by mouth daily.      Marland Kitchen ezetimibe (ZETIA) 10 MG tablet Take 10 mg by mouth every evening.       . feeding supplement, ENSURE, (ENSURE) PUDG Take 1 Container by  mouth 3 (three) times daily between meals.    0  . insulin aspart (NOVOLOG) 100 UNIT/ML injection 0-9 Units, Subcutaneous, 3 times daily with meals CBG < 70: implement hypoglycemia protocol CBG 70 - 120: 0 units CBG 121 - 150: 1 unit CBG 151 - 200: 2 units CBG 201 - 250: 3 units CBG 251 - 300: 5 units CBG 301 - 350: 7 units CBG 351 - 400: 9 units CBG > 400: call MD  10 mL  11  . insulin glargine (LANTUS) 100 UNIT/ML injection Inject 8 Units into the skin daily with breakfast.      . levETIRAcetam (KEPPRA) 1000 MG tablet Take 1 tablet (1,000 mg total) by mouth 2 (two) times daily.      Marland Kitchen lisinopril (PRINIVIL,ZESTRIL) 2.5 MG tablet Take 1 tablet (2.5 mg total) by mouth daily.      . Maltodextrin-Xanthan Gum (RESOURCE THICKENUP CLEAR) POWD Use thickening liquids as needed      . oxybutynin (DITROPAN-XL) 5 MG 24 hr tablet Take 5 mg by mouth at bedtime.      . solifenacin (VESICARE) 5 MG tablet Take 5 mg by mouth daily.      . Tamsulosin HCl (FLOMAX) 0.4 MG CAPS Take 0.4 mg by mouth daily.        . Vitamin D, Ergocalciferol, (DRISDOL) 50000 UNITS CAPS capsule Take 50,000 Units by mouth every 7 (seven)  days. On Wednesday       No current facility-administered medications for this visit.    Allergies:  Allergies  Allergen Reactions  . Ativan [Lorazepam] Other (See Comments)    Very lethargic  . Benadryl [Diphenhydramine Hcl] Other (See Comments)    Very lethargic    Past Medical History, Surgical history, Social history, and Family History were reviewed and updated.    Physical Exam: Blood pressure 139/69, pulse 75, temperature 97.6 F (36.4 C), temperature source Oral, resp. rate 18, weight 0 lb (0 kg), SpO2 96.00%. ECOG: 3 General appearance: alert Head: Normocephalic, without obvious abnormality, atraumatic Neck: no adenopathy Lymph nodes: Cervical, supraclavicular, and axillary nodes normal. Heart:regular rate and rhythm, S1, S2 normal, no murmur, click, rub or  gallop Lung:chest clear, no wheezing, rales, normal symmetric air entry Abdomen: soft, non-tender, without masses or organomegaly EXT:no erythema, induration, or nodules. Bilateral BKA amputee   Lab Results: Lab Results  Component Value Date   WBC 10.5* 02/13/2014   HGB 15.4 02/13/2014   HCT 46.1 02/13/2014   MCV 97.4 02/13/2014   PLT 223 02/13/2014     EXAM:  CT CHEST, ABDOMEN, AND PELVIS WITH CONTRAST  TECHNIQUE:  Multidetector CT imaging of the chest, abdomen and pelvis was  performed following the standard protocol during bolus  administration of intravenous contrast.  CONTRAST: 159mL OMNIPAQUE IOHEXOL 300 MG/ML SOLN  COMPARISON: CT of the abdomen and pelvis 02/12/2013. Chest CT  12/05/2003.  FINDINGS:  CT CHEST FINDINGS  Mediastinum: Heart size is mildly enlarged. There is no significant  pericardial fluid, thickening or pericardial calcification. There is  atherosclerosis of the thoracic aorta, the great vessels of the  mediastinum and the coronary arteries, including calcified  atherosclerotic plaque in the left main, left anterior descending,  left circumflex and right coronary arteries. Stents are present in  the left circumflex and right coronary arteries. There is marked  myocardial thinning of the lateral wall of the left ventricle,  presumably from prior left circumflex coronary artery territory  myocardial infarction. No pathologically enlarged mediastinal or  hilar lymph nodes. Esophagus is unremarkable in appearance.  Lungs/Pleura: Evaluation of the lung parenchyma is limited by  considerable patient respiratory motion. With these limitations in  mind, there is no large suspicious appearing pulmonary nodule or  mass. No acute consolidative airspace disease. No pleural effusions.  There appears to be some thickening of the peribronchovascular  interstitium and subpleural reticulation in the lower lobes of the  lungs bilaterally, however, this is poorly evaluated on  today's  examination secondary to motion.  Musculoskeletal: There are no aggressive appearing lytic or blastic  lesions noted in the visualized portions of the skeleton.  CT ABDOMEN AND PELVIS FINDINGS  Abdomen/Pelvis: Status post cholecystectomy. The appearance of the  liver, pancreas, spleen, bilateral adrenal glands and the right  kidney is unremarkable. Sub cm low-attenuation lesion in the lower  pole of the left kidney is incompletely characterized, but similar  to prior examination, favored to represent a tiny cyst. Extensive  atherosclerosis throughout the abdominal and pelvic vasculature,  without evidence of aneurysm or dissection. No significant volume of  ascites. No pneumoperitoneum. No pathologic distention of small  bowel. Prostate gland and urinary bladder are unremarkable in  appearance. No lymphadenopathy a identified in the abdomen or  pelvis. There is a mildly enlarged left inguinal lymph node  measuring 1.4 cm in short axis. Operative changes in the right  inguinal region related to prior right-sided orchiectomy.  Musculoskeletal:  There are no aggressive appearing lytic or blastic  lesions noted in the visualized portions of the skeleton.  IMPRESSION:  1. Mildly enlarged left inguinal lymph node has decreased in size  compared to prior study 02/12/2013, and is nonspecific but favored  to be benign. No definite signs of metastatic disease in the chest,  abdomen or pelvis are otherwise noted on today's examination.  2. Atherosclerosis, including left main and 3 vessel coronary artery  disease. In addition, there is evidence of prior left circumflex  coronary artery territory myocardial infarction, as discussed above.  3. The appearance of the lower lobes of the lungs is suspicious for  fibrosis. This is poorly evaluated on today's study secondary to  respiratory motion, however, similar findings are present in  retrospect on remote prior scan from 2005. Given the  basilar  distribution of these findings (and a lack of findings in the other  portions of lungs), this is favored to represent sequela of  recurrent aspiration. This could be better evaluated with followup  nonemergent high-resolution chest CT if there is any clinical  concern for other interstitial lung disease.  4. Additional incidental findings, as above.    Impression and Plan: This is a pleasant 80 year old gentleman with the following issues:  1. 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  No evidence of any recurrent disease at this point, doing well clinically.  Laboratory values, CT scan from 02/13/2014 that was discussed today with the patient and his family. and physical exam do not suggest recurrent disease.  I think he is a rather marginal candidate for radiation therapy and we have elected to proceed with observation only.  Given the fact that he has been disease-free for over 3-1/2 years. I see no role for continuous surveillance at this time. No further imaging studies will be recommended. Given his other comorbid conditions and the difficulty for him to present for appointments I will recommend oncology followup to be as needed. 2. History of hemorrhage in his groin back in April 2012.  That has subsided. 3. DM. On multiple insulins per PCP. 4. Follow-up. As needed.      Saydee Zolman 7/14/201510:50 AM

## 2014-04-04 ENCOUNTER — Inpatient Hospital Stay (HOSPITAL_COMMUNITY): Payer: Medicare Other

## 2014-04-04 ENCOUNTER — Inpatient Hospital Stay (HOSPITAL_COMMUNITY)
Admission: EM | Admit: 2014-04-04 | Discharge: 2014-04-10 | DRG: 871 | Disposition: A | Discharge status: Other Acute Inpatient Hospital | Payer: Medicare Other | Attending: Family Medicine | Admitting: Family Medicine

## 2014-04-04 ENCOUNTER — Emergency Department (HOSPITAL_COMMUNITY): Payer: Medicare Other

## 2014-04-04 ENCOUNTER — Encounter (HOSPITAL_COMMUNITY): Payer: Self-pay | Admitting: Emergency Medicine

## 2014-04-04 DIAGNOSIS — M869 Osteomyelitis, unspecified: Secondary | ICD-10-CM

## 2014-04-04 DIAGNOSIS — L8995 Pressure ulcer of unspecified site, unstageable: Secondary | ICD-10-CM | POA: Diagnosis present

## 2014-04-04 DIAGNOSIS — Z800 Family history of malignant neoplasm of digestive organs: Secondary | ICD-10-CM | POA: Diagnosis not present

## 2014-04-04 DIAGNOSIS — I255 Ischemic cardiomyopathy: Secondary | ICD-10-CM

## 2014-04-04 DIAGNOSIS — R6521 Severe sepsis with septic shock: Secondary | ICD-10-CM

## 2014-04-04 DIAGNOSIS — E538 Deficiency of other specified B group vitamins: Secondary | ICD-10-CM

## 2014-04-04 DIAGNOSIS — E1139 Type 2 diabetes mellitus with other diabetic ophthalmic complication: Secondary | ICD-10-CM | POA: Diagnosis present

## 2014-04-04 DIAGNOSIS — Z22322 Carrier or suspected carrier of Methicillin resistant Staphylococcus aureus: Secondary | ICD-10-CM | POA: Diagnosis not present

## 2014-04-04 DIAGNOSIS — Z89512 Acquired absence of left leg below knee: Secondary | ICD-10-CM

## 2014-04-04 DIAGNOSIS — A419 Sepsis, unspecified organism: Principal | ICD-10-CM

## 2014-04-04 DIAGNOSIS — E876 Hypokalemia: Secondary | ICD-10-CM | POA: Diagnosis not present

## 2014-04-04 DIAGNOSIS — Z794 Long term (current) use of insulin: Secondary | ICD-10-CM

## 2014-04-04 DIAGNOSIS — E870 Hyperosmolality and hypernatremia: Secondary | ICD-10-CM | POA: Diagnosis not present

## 2014-04-04 DIAGNOSIS — I2589 Other forms of chronic ischemic heart disease: Secondary | ICD-10-CM | POA: Diagnosis present

## 2014-04-04 DIAGNOSIS — E785 Hyperlipidemia, unspecified: Secondary | ICD-10-CM

## 2014-04-04 DIAGNOSIS — Z8547 Personal history of malignant neoplasm of testis: Secondary | ICD-10-CM

## 2014-04-04 DIAGNOSIS — K2990 Gastroduodenitis, unspecified, without bleeding: Secondary | ICD-10-CM

## 2014-04-04 DIAGNOSIS — G40909 Epilepsy, unspecified, not intractable, without status epilepticus: Secondary | ICD-10-CM

## 2014-04-04 DIAGNOSIS — N179 Acute kidney failure, unspecified: Secondary | ICD-10-CM

## 2014-04-04 DIAGNOSIS — I509 Heart failure, unspecified: Secondary | ICD-10-CM | POA: Diagnosis present

## 2014-04-04 DIAGNOSIS — R4020 Unspecified coma: Secondary | ICD-10-CM

## 2014-04-04 DIAGNOSIS — N189 Chronic kidney disease, unspecified: Secondary | ICD-10-CM | POA: Diagnosis present

## 2014-04-04 DIAGNOSIS — R40244 Other coma, without documented Glasgow coma scale score, or with partial score reported, unspecified time: Secondary | ICD-10-CM

## 2014-04-04 DIAGNOSIS — J690 Pneumonitis due to inhalation of food and vomit: Secondary | ICD-10-CM | POA: Diagnosis present

## 2014-04-04 DIAGNOSIS — J189 Pneumonia, unspecified organism: Secondary | ICD-10-CM | POA: Diagnosis present

## 2014-04-04 DIAGNOSIS — Z7982 Long term (current) use of aspirin: Secondary | ICD-10-CM

## 2014-04-04 DIAGNOSIS — E1149 Type 2 diabetes mellitus with other diabetic neurological complication: Secondary | ICD-10-CM

## 2014-04-04 DIAGNOSIS — E872 Acidosis, unspecified: Secondary | ICD-10-CM | POA: Diagnosis present

## 2014-04-04 DIAGNOSIS — Z9861 Coronary angioplasty status: Secondary | ICD-10-CM

## 2014-04-04 DIAGNOSIS — R29898 Other symptoms and signs involving the musculoskeletal system: Secondary | ICD-10-CM

## 2014-04-04 DIAGNOSIS — C499 Malignant neoplasm of connective and soft tissue, unspecified: Secondary | ICD-10-CM

## 2014-04-04 DIAGNOSIS — G9341 Metabolic encephalopathy: Secondary | ICD-10-CM | POA: Diagnosis present

## 2014-04-04 DIAGNOSIS — R569 Unspecified convulsions: Secondary | ICD-10-CM

## 2014-04-04 DIAGNOSIS — J9601 Acute respiratory failure with hypoxia: Secondary | ICD-10-CM

## 2014-04-04 DIAGNOSIS — S88119A Complete traumatic amputation at level between knee and ankle, unspecified lower leg, initial encounter: Secondary | ICD-10-CM | POA: Diagnosis not present

## 2014-04-04 DIAGNOSIS — L97909 Non-pressure chronic ulcer of unspecified part of unspecified lower leg with unspecified severity: Secondary | ICD-10-CM

## 2014-04-04 DIAGNOSIS — J9600 Acute respiratory failure, unspecified whether with hypoxia or hypercapnia: Secondary | ICD-10-CM | POA: Diagnosis present

## 2014-04-04 DIAGNOSIS — R6520 Severe sepsis without septic shock: Secondary | ICD-10-CM | POA: Diagnosis present

## 2014-04-04 DIAGNOSIS — I129 Hypertensive chronic kidney disease with stage 1 through stage 4 chronic kidney disease, or unspecified chronic kidney disease: Secondary | ICD-10-CM | POA: Diagnosis present

## 2014-04-04 DIAGNOSIS — R1012 Left upper quadrant pain: Secondary | ICD-10-CM

## 2014-04-04 DIAGNOSIS — Z66 Do not resuscitate: Secondary | ICD-10-CM | POA: Diagnosis present

## 2014-04-04 DIAGNOSIS — L89109 Pressure ulcer of unspecified part of back, unspecified stage: Secondary | ICD-10-CM | POA: Diagnosis present

## 2014-04-04 DIAGNOSIS — R4701 Aphasia: Secondary | ICD-10-CM | POA: Diagnosis present

## 2014-04-04 DIAGNOSIS — I69959 Hemiplegia and hemiparesis following unspecified cerebrovascular disease affecting unspecified side: Secondary | ICD-10-CM

## 2014-04-04 DIAGNOSIS — E119 Type 2 diabetes mellitus without complications: Secondary | ICD-10-CM

## 2014-04-04 DIAGNOSIS — I2510 Atherosclerotic heart disease of native coronary artery without angina pectoris: Secondary | ICD-10-CM

## 2014-04-04 DIAGNOSIS — I739 Peripheral vascular disease, unspecified: Secondary | ICD-10-CM | POA: Diagnosis present

## 2014-04-04 DIAGNOSIS — E11319 Type 2 diabetes mellitus with unspecified diabetic retinopathy without macular edema: Secondary | ICD-10-CM | POA: Diagnosis present

## 2014-04-04 DIAGNOSIS — E1142 Type 2 diabetes mellitus with diabetic polyneuropathy: Secondary | ICD-10-CM | POA: Diagnosis present

## 2014-04-04 DIAGNOSIS — I5022 Chronic systolic (congestive) heart failure: Secondary | ICD-10-CM

## 2014-04-04 DIAGNOSIS — R1310 Dysphagia, unspecified: Secondary | ICD-10-CM | POA: Diagnosis present

## 2014-04-04 DIAGNOSIS — I6203 Nontraumatic chronic subdural hemorrhage: Secondary | ICD-10-CM

## 2014-04-04 DIAGNOSIS — Z87891 Personal history of nicotine dependence: Secondary | ICD-10-CM

## 2014-04-04 DIAGNOSIS — K8020 Calculus of gallbladder without cholecystitis without obstruction: Secondary | ICD-10-CM

## 2014-04-04 DIAGNOSIS — K921 Melena: Secondary | ICD-10-CM

## 2014-04-04 DIAGNOSIS — E1101 Type 2 diabetes mellitus with hyperosmolarity with coma: Secondary | ICD-10-CM

## 2014-04-04 DIAGNOSIS — Z89511 Acquired absence of right leg below knee: Secondary | ICD-10-CM

## 2014-04-04 DIAGNOSIS — I10 Essential (primary) hypertension: Secondary | ICD-10-CM

## 2014-04-04 DIAGNOSIS — K2970 Gastritis, unspecified, without bleeding: Secondary | ICD-10-CM

## 2014-04-04 LAB — COMPREHENSIVE METABOLIC PANEL
ALT: 30 U/L (ref 0–53)
ALT: 25 U/L (ref 0–53)
ANION GAP: 15 (ref 5–15)
AST: 29 U/L (ref 0–37)
AST: 30 U/L (ref 0–37)
Albumin: 2.1 g/dL — ABNORMAL LOW (ref 3.5–5.2)
Albumin: 1.7 g/dL — ABNORMAL LOW (ref 3.5–5.2)
Alkaline Phosphatase: 110 U/L (ref 39–117)
Alkaline Phosphatase: 90 U/L (ref 39–117)
Anion gap: 16 — ABNORMAL HIGH (ref 5–15)
BUN: 55 mg/dL — AB (ref 6–23)
BUN: 45 mg/dL — AB (ref 6–23)
CALCIUM: 7.8 mg/dL — AB (ref 8.4–10.5)
CHLORIDE: 120 meq/L — AB (ref 96–112)
CO2: 24 mEq/L (ref 19–32)
CO2: 21 mEq/L (ref 19–32)
CREATININE: 2.07 mg/dL — AB (ref 0.50–1.35)
CREATININE: 1.52 mg/dL — AB (ref 0.50–1.35)
Calcium: 8.7 mg/dL (ref 8.4–10.5)
Chloride: 110 mEq/L (ref 96–112)
GFR calc Af Amer: 49 mL/min — ABNORMAL LOW (ref >90)
GFR calc non Af Amer: 42 mL/min — ABNORMAL LOW (ref >90)
GFR, EST AFRICAN AMERICAN: 34 mL/min — AB (ref >90)
GFR, EST NON AFRICAN AMERICAN: 29 mL/min — AB (ref >90)
Glucose, Bld: 437 mg/dL — ABNORMAL HIGH (ref 70–99)
Glucose, Bld: 353 mg/dL — ABNORMAL HIGH (ref 70–99)
Potassium: 4.7 mEq/L (ref 3.7–5.3)
Potassium: 3.9 mEq/L (ref 3.7–5.3)
Sodium: 150 mEq/L — ABNORMAL HIGH (ref 137–147)
Sodium: 156 mEq/L — ABNORMAL HIGH (ref 137–147)
TOTAL PROTEIN: 5.8 g/dL — AB (ref 6.0–8.3)
Total Bilirubin: 0.6 mg/dL (ref 0.3–1.2)
Total Bilirubin: 0.5 mg/dL (ref 0.3–1.2)
Total Protein: 6.6 g/dL (ref 6.0–8.3)

## 2014-04-04 LAB — DIFFERENTIAL
BASOS PCT: 0 % (ref 0–1)
Basophils Absolute: 0 10*3/uL (ref 0.0–0.1)
Eosinophils Absolute: 0 10*3/uL (ref 0.0–0.7)
Eosinophils Relative: 0 % (ref 0–5)
LYMPHS ABS: 1.2 10*3/uL (ref 0.7–4.0)
Lymphocytes Relative: 8 % — ABNORMAL LOW (ref 12–46)
MONO ABS: 0.8 10*3/uL (ref 0.1–1.0)
MONOS PCT: 5 % (ref 3–12)
NEUTROS ABS: 13.2 10*3/uL — AB (ref 1.7–7.7)
NEUTROS PCT: 87 % — AB (ref 43–77)

## 2014-04-04 LAB — PHENYTOIN LEVEL, TOTAL

## 2014-04-04 LAB — RAPID URINE DRUG SCREEN, HOSP PERFORMED
AMPHETAMINES: NOT DETECTED
Barbiturates: NOT DETECTED
Benzodiazepines: NOT DETECTED
Cocaine: NOT DETECTED
Opiates: NOT DETECTED
Tetrahydrocannabinol: NOT DETECTED

## 2014-04-04 LAB — URINALYSIS, ROUTINE W REFLEX MICROSCOPIC
GLUCOSE, UA: 250 mg/dL — AB
KETONES UR: 15 mg/dL — AB
Nitrite: NEGATIVE
Protein, ur: NEGATIVE mg/dL
Specific Gravity, Urine: 1.023 (ref 1.005–1.030)
Urobilinogen, UA: 1 mg/dL (ref 0.0–1.0)
pH: 5 (ref 5.0–8.0)

## 2014-04-04 LAB — CBC
HEMATOCRIT: 47.3 % (ref 39.0–52.0)
HEMATOCRIT: 39.6 % (ref 39.0–52.0)
HEMOGLOBIN: 15.3 g/dL (ref 13.0–17.0)
Hemoglobin: 12.3 g/dL — ABNORMAL LOW (ref 13.0–17.0)
MCH: 33.5 pg (ref 26.0–34.0)
MCH: 32.7 pg (ref 26.0–34.0)
MCHC: 32.3 g/dL (ref 30.0–36.0)
MCHC: 31.1 g/dL (ref 30.0–36.0)
MCV: 103.5 fL — ABNORMAL HIGH (ref 78.0–100.0)
MCV: 105.3 fL — AB (ref 78.0–100.0)
PLATELETS: 203 10*3/uL (ref 150–400)
Platelets: 301 10*3/uL (ref 150–400)
RBC: 4.57 MIL/uL (ref 4.22–5.81)
RBC: 3.76 MIL/uL — ABNORMAL LOW (ref 4.22–5.81)
RDW: 14.8 % (ref 11.5–15.5)
RDW: 14.8 % (ref 11.5–15.5)
WBC: 15.2 10*3/uL — ABNORMAL HIGH (ref 4.0–10.5)
WBC: 11.7 10*3/uL — ABNORMAL HIGH (ref 4.0–10.5)

## 2014-04-04 LAB — I-STAT CHEM 8, ED
BUN: 50 mg/dL — ABNORMAL HIGH (ref 6–23)
CALCIUM ION: 1.15 mmol/L (ref 1.13–1.30)
Chloride: 114 mEq/L — ABNORMAL HIGH (ref 96–112)
Creatinine, Ser: 2 mg/dL — ABNORMAL HIGH (ref 0.50–1.35)
GLUCOSE: 417 mg/dL — AB (ref 70–99)
HCT: 49 % (ref 39.0–52.0)
Hemoglobin: 16.7 g/dL (ref 13.0–17.0)
Potassium: 4.5 mEq/L (ref 3.7–5.3)
Sodium: 150 mEq/L — ABNORMAL HIGH (ref 137–147)
TCO2: 23 mmol/L (ref 0–100)

## 2014-04-04 LAB — GLUCOSE, CAPILLARY: Glucose-Capillary: 361 mg/dL — ABNORMAL HIGH (ref 70–99)

## 2014-04-04 LAB — URINE MICROSCOPIC-ADD ON

## 2014-04-04 LAB — TROPONIN I: Troponin I: <0.3 ng/mL (ref <0.30)

## 2014-04-04 LAB — PRO B NATRIURETIC PEPTIDE: PRO B NATRI PEPTIDE: 865.8 pg/mL — AB (ref 0–450)

## 2014-04-04 LAB — I-STAT ARTERIAL BLOOD GAS, ED
Acid-base deficit: 2 mmol/L (ref 0.0–2.0)
BICARBONATE: 22.7 meq/L (ref 20.0–24.0)
O2 Saturation: 100 %
PH ART: 7.403 (ref 7.350–7.450)
TCO2: 24 mmol/L (ref 0–100)
pCO2 arterial: 36.4 mmHg (ref 35.0–45.0)
pO2, Arterial: 368 mmHg — ABNORMAL HIGH (ref 80.0–100.0)

## 2014-04-04 LAB — MAGNESIUM: Magnesium: 2 mg/dL (ref 1.5–2.5)

## 2014-04-04 LAB — PROTIME-INR
INR: 1.22 (ref 0.00–1.49)
INR: 1.27 (ref 0.00–1.49)
Prothrombin Time: 15.4 seconds — ABNORMAL HIGH (ref 11.6–15.2)
Prothrombin Time: 15.9 seconds — ABNORMAL HIGH (ref 11.6–15.2)

## 2014-04-04 LAB — MRSA PCR SCREENING: MRSA BY PCR: POSITIVE — AB

## 2014-04-04 LAB — STREP PNEUMONIAE URINARY ANTIGEN: Strep Pneumo Urinary Antigen: NEGATIVE

## 2014-04-04 LAB — I-STAT TROPONIN, ED: Troponin i, poc: 0.05 ng/mL (ref 0.00–0.08)

## 2014-04-04 LAB — APTT: aPTT: 30 seconds (ref 24–37)

## 2014-04-04 LAB — ETHANOL: Alcohol, Ethyl (B): <11 mg/dL (ref 0–11)

## 2014-04-04 LAB — CBG MONITORING, ED: Glucose-Capillary: 354 mg/dL — ABNORMAL HIGH (ref 70–99)

## 2014-04-04 LAB — PHOSPHORUS: PHOSPHORUS: 3.9 mg/dL (ref 2.3–4.6)

## 2014-04-04 LAB — I-STAT CG4 LACTIC ACID, ED: LACTIC ACID, VENOUS: 3.88 mmol/L — AB (ref 0.5–2.2)

## 2014-04-04 LAB — LIPASE, BLOOD: LIPASE: 9 U/L — AB (ref 11–59)

## 2014-04-04 MED ORDER — PIPERACILLIN-TAZOBACTAM 3.375 G IVPB
3.3750 g | Freq: Three times a day (TID) | INTRAVENOUS | Status: DC
  Administered 2014-04-04 – 2014-04-08 (×11): 3.375 g via INTRAVENOUS
  Filled 2014-04-04 (×13): qty 50

## 2014-04-04 MED ORDER — ROCURONIUM BROMIDE 50 MG/5ML IV SOLN
100.0000 mg | Freq: Once | INTRAVENOUS | Status: AC
  Administered 2014-04-04: 100 mg via INTRAVENOUS

## 2014-04-04 MED ORDER — DEXTROSE 5 % IV SOLN
2.0000 g | Freq: Once | INTRAVENOUS | Status: DC
  Filled 2014-04-04: qty 2

## 2014-04-04 MED ORDER — CLOPIDOGREL BISULFATE 75 MG PO TABS
75.0000 mg | ORAL_TABLET | Freq: Every day | ORAL | Status: DC
  Administered 2014-04-05 – 2014-04-10 (×6): 75 mg via ORAL
  Filled 2014-04-04 (×8): qty 1

## 2014-04-04 MED ORDER — ETOMIDATE 2 MG/ML IV SOLN
30.0000 mg | Freq: Once | INTRAVENOUS | Status: AC
  Administered 2014-04-04: 30 mg via INTRAVENOUS

## 2014-04-04 MED ORDER — SODIUM CHLORIDE 0.9 % IV SOLN
INTRAVENOUS | Status: DC
Start: 2014-04-04 — End: 2014-04-05
  Administered 2014-04-04: 21:00:00 via INTRAVENOUS

## 2014-04-04 MED ORDER — NALOXONE HCL 1 MG/ML IJ SOLN
2.0000 mg | Freq: Once | INTRAMUSCULAR | Status: AC
  Administered 2014-04-04: 2 mg via INTRAVENOUS
  Filled 2014-04-04: qty 2

## 2014-04-04 MED ORDER — DEXTROSE 5 % IV SOLN
1.0000 g | Freq: Once | INTRAVENOUS | Status: AC
  Administered 2014-04-04: 1 g via INTRAVENOUS
  Filled 2014-04-04: qty 1

## 2014-04-04 MED ORDER — DEXTROSE-NACL 5-0.45 % IV SOLN
INTRAVENOUS | Status: DC
Start: 2014-04-04 — End: 2014-04-06
  Administered 2014-04-05: 03:00:00 via INTRAVENOUS
  Administered 2014-04-05: 75 mL/h via INTRAVENOUS

## 2014-04-04 MED ORDER — PHENYTOIN 125 MG/5ML PO SUSP
100.0000 mg | Freq: Two times a day (BID) | ORAL | Status: DC
  Administered 2014-04-04 – 2014-04-06 (×4): 100 mg via ORAL
  Filled 2014-04-04 (×5): qty 4

## 2014-04-04 MED ORDER — PHENYTOIN 100 MG/4ML PO SUSP
100.0000 mg | Freq: Two times a day (BID) | ORAL | Status: DC
  Filled 2014-04-04 (×17): qty 4

## 2014-04-04 MED ORDER — SODIUM CHLORIDE 0.9 % IV BOLUS (SEPSIS)
1000.0000 mL | INTRAVENOUS | Status: DC | PRN
  Administered 2014-04-04: 1000 mL via INTRAVENOUS

## 2014-04-04 MED ORDER — POTASSIUM CHLORIDE 10 MEQ/100ML IV SOLN
10.0000 meq | INTRAVENOUS | Status: AC
  Administered 2014-04-04 (×2): 10 meq via INTRAVENOUS
  Filled 2014-04-04 (×2): qty 100

## 2014-04-04 MED ORDER — PIPERACILLIN-TAZOBACTAM 3.375 G IVPB 30 MIN: 3.3750 g | Freq: Three times a day (TID) | INTRAVENOUS | Status: DC

## 2014-04-04 MED ORDER — FAMOTIDINE 20 MG PO TABS
20.0000 mg | ORAL_TABLET | Freq: Two times a day (BID) | ORAL | Status: DC
  Filled 2014-04-04: qty 1

## 2014-04-04 MED ORDER — CHLORHEXIDINE GLUCONATE 0.12 % MT SOLN
15.0000 mL | Freq: Two times a day (BID) | OROMUCOSAL | Status: DC
  Administered 2014-04-05 – 2014-04-10 (×13): 15 mL via OROMUCOSAL
  Filled 2014-04-04 (×12): qty 15

## 2014-04-04 MED ORDER — VANCOMYCIN HCL 10 G IV SOLR
1250.0000 mg | INTRAVENOUS | Status: DC
  Filled 2014-04-04: qty 1250

## 2014-04-04 MED ORDER — HEPARIN SODIUM (PORCINE) 5000 UNIT/ML IJ SOLN
5000.0000 [IU] | Freq: Three times a day (TID) | INTRAMUSCULAR | Status: DC
  Administered 2014-04-04 – 2014-04-07 (×9): 5000 [IU] via SUBCUTANEOUS
  Filled 2014-04-04 (×11): qty 1

## 2014-04-04 MED ORDER — SODIUM CHLORIDE 0.9 % IV SOLN
INTRAVENOUS | Status: AC
  Administered 2014-04-04: 21:00:00 via INTRAVENOUS

## 2014-04-04 MED ORDER — HALOPERIDOL LACTATE 5 MG/ML IJ SOLN: 1.0000 mg | Freq: Four times a day (QID) | INTRAMUSCULAR | Status: DC | PRN

## 2014-04-04 MED ORDER — DEXTROSE 50 % IV SOLN: 25.0000 mL | INTRAVENOUS | Status: DC | PRN

## 2014-04-04 MED ORDER — VANCOMYCIN HCL 10 G IV SOLR
1250.0000 mg | Freq: Once | INTRAVENOUS | Status: AC
  Administered 2014-04-04: 1250 mg via INTRAVENOUS
  Filled 2014-04-04: qty 1250

## 2014-04-04 MED ORDER — PHENYLEPHRINE HCL 10 MG/ML IJ SOLN
30.0000 ug/min | INTRAVENOUS | Status: DC
  Administered 2014-04-04: 30 ug/min via INTRAVENOUS
  Filled 2014-04-04: qty 1

## 2014-04-04 MED ORDER — ASPIRIN EC 81 MG PO TBEC
81.0000 mg | DELAYED_RELEASE_TABLET | Freq: Every day | ORAL | Status: DC
  Administered 2014-04-05 – 2014-04-07 (×3): 81 mg via ORAL
  Filled 2014-04-04 (×4): qty 1

## 2014-04-04 MED ORDER — SODIUM CHLORIDE 0.9 % IV SOLN
1000.0000 mL | INTRAVENOUS | Status: DC
  Administered 2014-04-04: 1000 mL via INTRAVENOUS

## 2014-04-04 MED ORDER — SODIUM CHLORIDE 0.9 % IV BOLUS (SEPSIS)
30.0000 mL/kg | Freq: Once | INTRAVENOUS | Status: AC
  Administered 2014-04-04: 2817 mL via INTRAVENOUS

## 2014-04-04 MED ORDER — CETYLPYRIDINIUM CHLORIDE 0.05 % MT LIQD
7.0000 mL | Freq: Four times a day (QID) | OROMUCOSAL | Status: DC
  Administered 2014-04-05 – 2014-04-10 (×23): 7 mL via OROMUCOSAL

## 2014-04-04 MED ORDER — FAMOTIDINE 40 MG/5ML PO SUSR
20.0000 mg | Freq: Two times a day (BID) | ORAL | Status: DC
  Administered 2014-04-04 – 2014-04-06 (×4): 20 mg via ORAL
  Filled 2014-04-04 (×5): qty 2.5

## 2014-04-04 MED ORDER — VANCOMYCIN HCL 10 G IV SOLR: 1250.0000 mg | INTRAVENOUS | Status: DC

## 2014-04-04 MED ORDER — INSULIN REGULAR HUMAN 100 UNIT/ML IJ SOLN
INTRAMUSCULAR | Status: DC
  Administered 2014-04-04: 2.6 [IU]/h via INTRAVENOUS
  Filled 2014-04-04: qty 2.5

## 2014-04-04 MED ORDER — ASPIRIN 81 MG PO CHEW: 324.0000 mg | CHEWABLE_TABLET | ORAL | Status: AC

## 2014-04-04 MED ORDER — ASPIRIN EC 81 MG PO TBEC
81.0000 mg | DELAYED_RELEASE_TABLET | Freq: Every day | ORAL | Status: DC
  Filled 2014-04-04: qty 1

## 2014-04-04 MED ORDER — SODIUM CHLORIDE 0.9 % IV SOLN: 250.0000 mL | INTRAVENOUS | Status: DC | PRN

## 2014-04-04 MED ORDER — ASPIRIN 300 MG RE SUPP
300.0000 mg | RECTAL | Status: AC
  Administered 2014-04-04: 300 mg via RECTAL
  Filled 2014-04-04: qty 1

## 2014-04-04 MED ORDER — DEXTROSE 5 % IV SOLN: 2.0000 g | INTRAVENOUS | Status: DC

## 2014-04-04 MED ORDER — HEPARIN SODIUM (PORCINE) 5000 UNIT/ML IJ SOLN: 5000.0000 [IU] | Freq: Three times a day (TID) | INTRAMUSCULAR | Status: DC

## 2014-04-04 NOTE — ED Notes (Signed)
Pt returned from CT by RN.

## 2014-04-04 NOTE — ED Provider Notes (Signed)
Patient seen/examined in the Emergency Department in conjunction with Resident Physician Provider Lozier  Patient is unresponsive/altered from nursing home Exam : unresponsive, minimal response to pain, he is tachypneic Plan: will need intubation and ICU admission   Sharyon Cable, MD 04/04/14 1719

## 2014-04-04 NOTE — ED Notes (Signed)
Family brought to bedside by RN

## 2014-04-04 NOTE — Progress Notes (Signed)
11:59 PM  Positive MRSA PCR nasal swab Notified Dr Ancil Linsey Initiated contact precautions  Blair Hailey, RN

## 2014-04-04 NOTE — Progress Notes (Signed)
Pt arrived to rm 2M08 via stretcher from ED; initiate plan of care; MD at bedside... Blair Hailey, RN

## 2014-04-04 NOTE — ED Notes (Signed)
Pt transported to CT by RN.

## 2014-04-04 NOTE — Progress Notes (Signed)
Chaplain Note: Responded to RN's request to provide support for patient's family. Located patient's son sitting in Consultation room B. He appeared quite upset. Safe/supportive environment facilitated so patient could share his feelings.  Patient's son shared that he is the primary caregiver for both his parents and its hard on him. He is having a hard time seeing his father like this. Listened empathically as he shared his story and the stress of having all the responsibilities fall on him since his sisters are not able to help. Provided emotional support and offered refreshments. Will continue to follow and provide support as needed. Dorris Fetch, Chaplain

## 2014-04-04 NOTE — ED Notes (Signed)
Lab results given to Dr.Wickline of [facility].

## 2014-04-04 NOTE — ED Notes (Signed)
Attempted report X1

## 2014-04-04 NOTE — H&P (Signed)
PULMONARY / CRITICAL CARE MEDICINE HISTORY AND PHYSICAL EXAMINATION   Name: [name] MRN: [id number] DOB: 02-Aug-1933    ADMISSION DATE:  04/04/2014  PRIMARY SERVICE: PCCM  CHIEF COMPLAINT:  AMS  BRIEF PATIENT DESCRIPTION: 80 y/o man with heart failure and multiple strokes presenting with altered mental status, sepsis of pulmonary origin, HHS, and inability to protect his airway.  SIGNIFICANT EVENTS / STUDIES:  Intubated in the ED for low GCS  LINES / TUBES: ETT - placed in ED 8/28 PIV x2 (18 ga) OGT - placed in ED 8/28  CULTURES: Blood cx from ED x2 - 8/28 Urine cx from ED - 8/28  ANTIBIOTICS: Cefepime - 8/28- Vanc - 8/28-  HISTORY OF PRESENT ILLNESS:   [name] is a 80 y/o man with with a history most significant for vascular disease including stroke x2, ischemic cardiomyopathy (EF 25-30%), DM2, HTN, CKD, bilateral BKA, and seizure disorder who was noted to have altered mental status and possible L sided weakness at his SNF today. The patient's son reports that since his most recent stroke earlier this year, the patient has a dense right-sided hemiparesis, and really only has limited functional use of his LUE. They report he has a moderate expressive aphasia, and a mild baseline encephalopathy, but is able to speak and generally be understood. On the day of admission he was noted to be essentially non-verbal, with a questionable left-sided facial droop. He was unable to take PO. The patient was found to be hypoxic and was sent to the ED. He was found to have a low GCS on arrival, and was intubated. Critical care was consulted for further management.  PAST MEDICAL HISTORY :  Past Medical History  Diagnosis Date  . Diabetes mellitus   . Hypertension   . Testicular cancer dx'd 08/2010    surg only  . CHF (congestive heart failure)   . CAD (coronary artery disease)     s/p stent to OM1 remotely, DES to mRCA 2006  . Peripheral vascular disease   . Vitamin B12 deficiency    . Diabetic retinopathy   . Diabetic peripheral neuropathy   . Cholelithiasis   . Family history of colon cancer     brother   . 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 Osteomyelitis of finger of right hand 03/2013    R middle finger  . Pressure ulcer of buttock 03/2013    stage 2  . Dysphagia     pt eats a mechanical diet    Past Surgical History  Procedure Laterality Date  . Below knee leg amputation  2005    Right  . Below knee leg amputation  2006    Left  . Appendectomy    . Eye surgery  2012    Laser  . Ptca    . Trudee Kuster hole Right 03/04/2013    Procedure: Haskell Flirt;  Surgeon: Charlie Pitter, MD;  Location: [facility];  Service: Neurosurgery;  Laterality: Right;  Burr Holes   . Tee without cardioversion N/A 12/20/2013    Procedure: TRANSESOPHAGEAL ECHOCARDIOGRAM (TEE);  Surgeon: Lelon Perla, MD;  Location: [facility];  Service: Cardiovascular;  Laterality: N/A;   Prior to Admission medications   Medication Sig Start Date End Date Taking? Authorizing Provider  aspirin EC 81 MG EC tablet Take 1 tablet (81 mg total) by mouth daily. 12/24/13   Shanker Kristeen Mans, MD  atorvastatin (LIPITOR) 20 MG tablet Take 20 mg by mouth at bedtime.    Historical Provider,  MD  carvedilol (COREG) 3.125 MG tablet Take 1 tablet (3.125 mg total) by mouth 2 (two) times daily with a meal. 12/24/13   Shanker Kristeen Mans, MD  clopidogrel (PLAVIX) 75 MG tablet Take 1 tablet (75 mg total) by mouth daily with breakfast. 12/24/13   Jonetta Osgood, MD  escitalopram (LEXAPRO) 10 MG tablet Take 10 mg by mouth daily.    Historical Provider, MD  ezetimibe (ZETIA) 10 MG tablet Take 10 mg by mouth every evening.     Historical Provider, MD  feeding supplement, ENSURE, (ENSURE) PUDG Take 1 Container by mouth 3 (three) times daily between meals. 01/27/14   Thurnell Lose, MD  heparin 5000 UNIT/ML injection  02/13/14   Historical Provider, MD  insulin aspart (NOVOLOG) 100 UNIT/ML injection 0-9 Units, Subcutaneous, 3 times daily  with meals CBG < 70: implement hypoglycemia protocol CBG 70 - 120: 0 units CBG 121 - 150: 1 unit CBG 151 - 200: 2 units CBG 201 - 250: 3 units CBG 251 - 300: 5 units CBG 301 - 350: 7 units CBG 351 - 400: 9 units CBG > 400: call MD 12/24/13   Jonetta Osgood, MD  insulin glargine (LANTUS) 100 UNIT/ML injection Inject 8 Units into the skin daily with breakfast.    Historical Provider, MD  levETIRAcetam (KEPPRA) 1000 MG tablet Take 1 tablet (1,000 mg total) by mouth 2 (two) times daily. 01/27/14   Thurnell Lose, MD  lisinopril (PRINIVIL,ZESTRIL) 2.5 MG tablet Take 1 tablet (2.5 mg total) by mouth daily. 12/24/13   Shanker Kristeen Mans, MD  Maltodextrin-Xanthan Gum (RESOURCE THICKENUP CLEAR) POWD Use thickening liquids as needed 01/27/14   Thurnell Lose, MD  oxybutynin (DITROPAN-XL) 5 MG 24 hr tablet Take 5 mg by mouth at bedtime.    Historical Provider, MD  phenytoin (DILANTIN) 100 MG/4ML suspension Take 125 mg by mouth 3 (three) times daily. Give 4 ml by mouth 2 times a day for seizure    Historical Provider, MD  simvastatin (ZOCOR) 40 MG tablet  11/29/13   Historical Provider, MD  solifenacin (VESICARE) 5 MG tablet Take 5 mg by mouth daily.    Historical Provider, MD  Tamsulosin HCl (FLOMAX) 0.4 MG CAPS Take 0.4 mg by mouth daily.      Historical Provider, MD  Vitamin D, Ergocalciferol, (DRISDOL) 50000 UNITS CAPS capsule Take 50,000 Units by mouth every 7 (seven) days. On Wednesday    Historical Provider, MD   Allergies  Allergen Reactions  . Ativan [Lorazepam] Other (See Comments)    Very lethargic  . Benadryl [Diphenhydramine Hcl] Other (See Comments)    Very lethargic    FAMILY HISTORY:  Family History  Problem Relation Age of Onset  . Colon cancer Brother    SOCIAL HISTORY:  reports that he has quit smoking. He has never used smokeless tobacco. He reports that he does not drink alcohol or use illicit drugs.  REVIEW OF SYSTEMS:  Unable to obtain 2/2 intubation,  AMS  SUBJECTIVE: No complaints  VITAL SIGNS: Temp:  [94.5 F (34.7 C)-100 F (37.8 C)] 98.2 F (36.8 C) (08/28 2041) Pulse Rate:  [82-104] 91 (08/28 2041) Resp:  [16-24] 16 (08/28 2041) BP: (58-180)/(39-90) 58/46 mmHg (08/28 2041) SpO2:  [100 %] 100 % (08/28 2041) FiO2 (%):  [50 %-100 %] 50 % (08/28 2019) Weight:  [199 lb 4.7 oz (90.4 kg)-207 lb (93.895 kg)] 199 lb 4.7 oz (90.4 kg) (08/28 2041) HEMODYNAMICS:   VENTILATOR SETTINGS: Vent Mode:  [-]  PRVC FiO2 (%):  [50 %-100 %] 50 % Set Rate:  [16 bmp] 16 bmp Vt Set:  [620 mL] 620 mL PEEP:  [5 cmH20] 5 cmH20 Plateau Pressure:  [16 cmH20-21 cmH20] 16 cmH20 INTAKE / OUTPUT: Intake/Output     08/28 0701 - 08/29 0700   I.V. (mL/kg) 3867 (42.8)   IV Piggyback 1000   Total Intake(mL/kg) 4867 (53.8)   Urine (mL/kg/hr) 700   Total Output 700   Net +4167         PHYSICAL EXAMINATION: General:  Elderly, chorically ill-appearing man. Intubated. Neuro: Opens eyes to voice, no spontaneous movement of R side. Non-purposeful movement of LUE. HEENT:  Temporal wasting noted. Dry mucous membranes. Neck: No JVD Cardiovascular: Cool extremities. RRR. Lungs:  CTAB. Abdomen:  Non-distended. Musculoskeletal:  Bilateral BKAs noted. Skin: cool and clammy. No rashes.  LABS:  CBC  Recent Labs Lab 04/04/14 1706 04/04/14 1735  WBC 15.2*  --   HGB 15.3 16.7  HCT 47.3 49.0  PLT 301  --    Coag's  Recent Labs Lab 04/04/14 1706  APTT 30  INR 1.22   BMET  Recent Labs Lab 04/04/14 1706 04/04/14 1735  NA 150* 150*  K 4.7 4.5  CL 110 114*  CO2 24  --   BUN 55* 50*  CREATININE 2.07* 2.00*  GLUCOSE 437* 417*   Electrolytes  Recent Labs Lab 04/04/14 1706  CALCIUM 8.7   Sepsis Markers  Recent Labs Lab 04/04/14 1735  LATICACIDVEN 3.88*   ABG  Recent Labs Lab 04/04/14 1841  PHART 7.403  PCO2ART 36.4  PO2ART 368.0*   Liver Enzymes  Recent Labs Lab 04/04/14 1706  AST 29  ALT 30  ALKPHOS 110  BILITOT  0.6  ALBUMIN 2.1*   Cardiac Enzymes  Recent Labs Lab 04/04/14 1706 04/04/14 1740  TROPONINI <0.30  --   PROBNP  --  865.8*   Glucose  Recent Labs Lab 04/04/14 1719 04/04/14 2034  GLUCAP 354* 361*    Imaging Ct Head Wo Contrast  04/04/2014   CLINICAL DATA:  80 year old male is unresponsive. Initial encounter.  EXAM: CT HEAD WITHOUT CONTRAST  TECHNIQUE: Contiguous axial images were obtained from the base of the skull through the vertex without intravenous contrast.  COMPARISON:  CTA head 01/24/2014 and earlier.  FINDINGS: Intubated with oral enteric tube in place on the scout view.  Mildly increased paranasal sinus mucosal thickening. No acute osseous abnormality identified. Chronic right frontal burr hole again noted. No acute orbit or scalp soft tissue findings. Calcified atherosclerosis at the skull base.  Mixed density right subdural hematoma and/or chronic dural thickening has not significantly changed. Trace leftward midline shift is stable. No ventriculomegaly. Progressed encephalomalacia in the left MCA territory, particularly in the left parietal lobe. Left ICA occlusion demonstrated on the prior. No new intracranial hemorrhage identified. No definite acute cortically based infarct Stable small chronic right cerebellar infarct. Stable chronic left PCA infarct. Stable small chronic lacunar right thalamus.  IMPRESSION: 1. No definite acute intracranial abnormality. Progressed left MCA territory encephalomalacia since June at which time left ICA occlusion was demonstrated by CTA. 2. Stable chronic right subdural/dural thickening. No new intracranial hemorrhage or significant intracranial mass effect. 3. Underlying advanced chronic ischemic disease.   Electronically Signed   By: Lars Pinks M.D.   On: 04/04/2014 18:29   Dg Chest Portable 1 View  04/04/2014   CLINICAL DATA:  Increased oral secretions. Concern for aspiration. Sepsis.  EXAM: PORTABLE CHEST - 1  VIEW  COMPARISON:  Of 04/04/2014   FINDINGS: Endotracheal tube tip measures 5.7 cm above the carinal. Enteric tube tip is off the field of view but is below the left hemidiaphragm. Shallow inspiration. There is atelectasis or infiltration in both lung bases, demonstrating mild increase since previous study. Changes are nonspecific but compatible with aspiration in the appropriate clinical setting. No pneumothorax. Normal heart size and pulmonary vascularity.  IMPRESSION: Shallow inspiration with increasing atelectasis or infiltration in the lung bases since prior study. Appliances appear to be in satisfactory location.   Electronically Signed   By: Lucienne Capers M.D.   On: 04/04/2014 21:14   Dg Chest Portable 1 View  04/04/2014   CLINICAL DATA:  Unresponsive.  Intubated patient.  EXAM: PORTABLE CHEST - 1 VIEW  COMPARISON:  CT 02/13/2014.  Radiographs 01/24/2014.  FINDINGS: 1737 hr. There are low lung volumes with mild patient rotation to the right. Endotracheal tube tip is approximately 4 cm above the carina. A nasogastric tube projects below the diaphragm, tip not visualized. There are increased bibasilar airspace opacities. No pneumothorax or significant pleural effusion is seen. The heart size and mediastinal contours are stable.  IMPRESSION: Satisfactorily positioned endotracheal and nasogastric tubes. Increased bibasilar airspace opacities may reflect atelectasis, aspiration or pneumonia.   Electronically Signed   By: Camie Patience M.D.   On: 04/04/2014 17:58   Dg Abd Portable 1v  04/04/2014   CLINICAL DATA:  Unresponsive.  EXAM: PORTABLE ABDOMEN - 1 VIEW  COMPARISON:  CT 02/13/2014  FINDINGS: Mild gaseous distention of the transverse colon. Moderate stool in the colon. No evidence of bowel obstruction. No supine evidence of free air organomegaly. No acute bony abnormality. Degenerative changes in the lumbar spine and hips.  IMPRESSION: No evidence of obstruction. Moderate stool burden and gaseous distention of the colon.    Electronically Signed   By: Rolm Baptise M.D.   On: 04/04/2014 17:58    EKG: Low voltage inferiorly. CXR: Low volumes, RLL infiltrate.  ASSESSMENT / PLAN:  Active Problems:   HCAP (healthcare-associated pneumonia)   Pneumonia   PULMONARY A: Hypoxic Respiratory Failure Need for mechanical ventilation Healthcare associated Pneumonia versus aspiration P:   Suspect patient aspirated due to AMS. Will maintain on ventilator for now. Tx in ED for PNA/asperation with cefepime / vanc. Will broaden cefepime to Zoysn for anearobic coverage.  CARDIOVASCULAR A: Chronic Systolic Heart Failure Hypotension P:   Will need to be judicious with volume management given HF. Given exam, history, presence of HHS, and lab abnormalities, believe patient to be hypovolemic. Will give fluid bolus and re-evaluate.  RENAL A: AKI P:   Due to hypovolemia. Hydrate as above.  GASTROINTESTINAL A: No acute issues P:   Will start tube feeds  HEMATOLOGIC A: Leukocytosis P:   Due to sepsis  INFECTIOUS A: Severe Sepsis due to HCAP P:   Treating with ABX as above: vanc / zosysn. Appreicate pharmacy assistance. Will hydrate with NS.  ENDOCRINE A: Hyperglycemic Hyperosmolar State P:   Treat with IVF + DKA protocol. Trend labs.  NEUROLOGIC A: Metabolic Encephalopathy, possible stroke Seizure disorder P:   CT head negative. Treat underlying cause. Given ASA / Plavix. Not a candidate for revascularization therapy. Continue home keppra and dilantin  BEST PRACTICE / DISPOSITION Level of Care:  ICU Primary Service:  PCCM Consultants:  None Code Status:  No escalation of care Diet:  NPO (tube feeds) DVT Px:  heparin GI Px:  Famotidine Skin Integrity:  Per RN  documentation Social / Family:  Updated. Family meeting noted separately.  TODAY'S SUMMARY: 80 y/o man with multiple co morbidities due to vascular disease presenting with sepsis, AKI, and HHS.  I have personally obtained a history,  examined the patient, evaluated laboratory and imaging results, formulated the assessment and plan and placed orders.  CRITICAL CARE: The patient is critically ill with multiple organ systems failure and requires high complexity decision making for assessment and support, frequent evaluation and titration of therapies, application of advanced monitoring technologies and extensive interpretation of multiple databases. Critical Care Time devoted to patient care services described in this note is 130 minutes.   Luz Brazen, MD Pulmonary & Critical Care Medicine April 04, 2014, 9:48 PM   04/04/2014, 9:18 PM

## 2014-04-04 NOTE — Progress Notes (Signed)
[place] Progress Note Patient Name: [name] DOB: Oct 18, 1932 MRN: [id number]   Date of Service  04/04/2014  HPI/Events of Note  Pt brought to [facility] ED  via [facility] from [facility].  Per records staff called out for pt being unresponsive for unknown period of time, staff found pt 02 70% on RA- pt placed on nasal canula bringing 02 up to 84%- EMS placed pt nonrebreather 94%. Palpated BP 60. Pt unresponsive upon arrival to [facility] ED, intubated  (RSI), and transferred to the ICU for further management and care.  MD at bedside had long discussion with the family who stated that he has been slowly declining and would not want to extraneous life saving measure or procedures given his complex medical history.  At this time family decided on no escalation of care, no pressors, no antiarrhythmics; only antibiotics and fluids.  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 No evidence of any recurrent disease at this point, doing well clinically. Laboratory values, CT scan from 02/13/2014 that was discussed today with the patient and his family. and physical exam do not suggest recurrent disease.   Cardiology History CAD status post prior PCI to the OM1 and RCA in 2006, ischemic cardiomyopathy, systolic CHF, HTN, HL, diabetes, PVD status post prior left femoral-popliteal bypass and subsequent bilateral BKA's, prior subdural hematoma 02/2013 requiring evacuation, seizure disorder. He was recently admitted 5/11-5/19 with a subacute bi-hemispheric CVA, likely embolic. Initial transthoracic echocardiogram demonstrated an EF of 50%. Follow up transesophageal echocardiogram demonstrated an EF of 25-30% and no cardiac source of embolus. Carotid US demonstrated an occluded LICA. He was seen by Dr. Rayann Heman. He was not felt to be a candidate for ICD implantation.   Studies:  - LHC (06/2005):  Proximal LAD 50%, mid LAD 40%, apical LAD 80%, ostial D2 90%, proximal OM1 stent patent, then 50 and 40% lesions, mid RCA 95% than 40%, distal RCA 70%, PDA and PL branch with scattered 70-80% throughout, EF 20-25%. PCI: Cypher DES to the mid RCA.  - Echo (12/18/13): Hypokinesis of the inferior and inferolateral walls. EF 50%. Grade 1 diastolic dysfunction). Mod LAE.  - TEEcho (12/20/13): Akinetic inferior lateral wall EF 25% to 30%. Mod to severe LAE. No LA or LAA clot. Neg microcavitation study.  - Carotid US (12/18/13): R 1-39%. LICA occluded.    eICU Interventions  80 yo male with PMHx of CHF, testicular Ca, DM, PVD s\p bilateral BKA, Sacral Ulcer, CVA, occluded L ICA,  found down at the NH for unknown amount of time admitted to ICU for respiratory failure Aspiration PNA, possible CVA  1. Respiratory Failure - currently on vent, not requiring any sedation or analgesia - continue to monitor - wean as tolerated, or possible terminal extubation  2. Bibasilar opacities - R>L - most likely patient aspirated in the setting of atelectasis  - continue with abx (vanc\zosyn)  3. Sepsis  - antibiotics and fluids - patient with low BP, family does not pressors at this time  4. Possible CVA??? - maybe a likely cause of him being found unresponsive, he has a hx of CVA     Intervention Category Evaluation Type: New Patient Evaluation  [name] 04/04/2014, 8:43 PM

## 2014-04-04 NOTE — ED Notes (Signed)
Pt to ED via [facility] from [facility]- staff called out for pt being unresponsive for unknown period of time, staff found pt 02 70% on RA- pt placed on nasal canula bringing 02 up to 84%- EMS placed pt nonrebreather 94%.  Palpated BP 60.  Pt unresponsive upon arrival to exam room.

## 2014-04-04 NOTE — Progress Notes (Addendum)
ANTIBIOTIC CONSULT NOTE - INITIAL  Pharmacy Consult for vancomycin Indication: pneumonia  Allergies  Allergen Reactions  . Ativan [Lorazepam] Other (See Comments)    Very lethargic  . Benadryl [Diphenhydramine Hcl] Other (See Comments)    Very lethargic    Patient Measurements: Height: 6' (182.9 cm) Weight: 207 lb (93.895 kg) IBW/kg (Calculated) : 77.6 Adjusted Body Weight:   Vital Signs: Temp: 97.8 F (36.6 C) (08/28 1708) Temp src: Axillary (08/28 1708) BP: 85/52 mmHg (08/28 1736) Intake/Output from previous day:   Intake/Output from this shift:    Labs:  Recent Labs  04/04/14 1706 04/04/14 1735  WBC 15.2*  --   HGB 15.3 16.7  PLT 301  --   CREATININE  --  2.00*    Microbiology: No results found for this or any previous visit (from the past 720 hour(s)).  Medical History: Past Medical History  Diagnosis Date  . Diabetes mellitus   . Hypertension   . Testicular cancer dx'd 08/2010    surg only  . CHF (congestive heart failure)   . CAD (coronary artery disease)     s/p stent to OM1 remotely, DES to mRCA 2006  . Peripheral vascular disease   . Vitamin B12 deficiency   . Diabetic retinopathy   . Diabetic peripheral neuropathy   . Cholelithiasis   . Family history of colon cancer     brother   . 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 Osteomyelitis of finger of right hand 03/2013    R middle finger  . Pressure ulcer of buttock 03/2013    stage 2  . Dysphagia     pt eats a mechanical diet     Medications:  See EMR  Assessment: 80 yo male found unresponsive.  Has a history of HTN, DM, CAD, CHF, TIA and osteomyelitis of R hand in 2014.  Initiating antibiotics for possible pneumonia.  Current white count at 15.2, lactic acid 3.88, hypothermic, hypotensive upon arrival.     Goal of Therapy:  Vancomycin trough level 15-20 mcg/ml  Plan:  Vancomycin 1250 mg IV q24h VT at Css as indicated Monitor renal fx, urine cx, blood cx, duration of therapy Recommend  continuing cefepime 2g/24h   Hughes Better, PharmD, BCPS Clinical Pharmacist Pager: [phone] 04/04/2014 5:53 PM    Addendum -Cefepime per pharmacy ordered -Continue cefepime 2 g IV q24h   Hughes Better, PharmD, BCPS Clinical Pharmacist  Cefepime has been changed to zosyn.  Zosyn 3.375g IV q8  Onnie Boer, PharmD Pager: [phone] 04/04/2014 9:56 PM

## 2014-04-04 NOTE — Plan of Care (Signed)
Met with patient's daughter and son, as well as grandson and step-son re: patient's presentation and goals of care. They acknowledged that his quality of life was declining, and they felt that he was nearing the end of his life. They also acknowledged that he was approaching an unacceptable quality of life prior to this acute illness. I outlined possible strategies of care (full, aggressive measures versus no escalation of care versus alterative) and realistic expectations with each, and the family elected to pursue current therapies targeted at treating sepsis, but not to escalate care to pressors / inotropes, dialysis, etc. His code status with be DNR / no escalation.  Luz Brazen, MD Pulmonary & Critical Care Medicine April 04, 2014, 9:54 PM

## 2014-04-04 NOTE — ED Provider Notes (Signed)
I have personally seen and examined the patient and discussed plan of care with the resident.  I was present for entire procedure.  I have reviewed the appropriate documentation on PMH/FH/Soc. History.  I have reviewed the documentation of the resident and agree.   CRITICAL CARE Performed by: Sharyon Cable Total critical care time: 40 Critical care time was exclusive of separately billable procedures and treating other patients. Critical care was necessary to treat or prevent imminent or life-threatening deterioration. Critical care was time spent personally by me on the following activities: development of treatment plan with patient and/or surrogate as well as nursing, discussions with consultants, evaluation of patient's response to treatment, examination of patient, obtaining history from patient or surrogate, ordering and performing treatments and interventions, ordering and review of laboratory studies, ordering and review of radiographic studies, pulse oximetry and re-evaluation of patient's condition.    Sharyon Cable, MD 04/04/14 [phone]

## 2014-04-04 NOTE — ED Provider Notes (Signed)
CSN: [id number]     Arrival date & time 04/04/14  1702 History   First MD Initiated Contact with Patient 04/04/14 1704     Chief Complaint  Patient presents with  . Unresponsive      (Consider location/radiation/quality/duration/timing/severity/associated sxs/prior Treatment) HPI Comments: [name] 80 y.o. With a pmh of HTn, DM, CAD, CHF, and reportedly a CVA presents for AMS by EMS. History limited due to this reason. Responding to pain only on arriva. BS with EMS was 400. EMS reported SBP of 70 as well. Hypoxic at the nursing home but responded to [facility] with EMS.   Patient is a 79 y.o. male presenting with altered mental status. The history is provided by the EMS personnel. The history is limited by the condition of the patient.  Altered Mental Status Presenting symptoms: unresponsiveness   Severity:  Severe Most recent episode:  Today Episode number: uknown as EMS could not obtain a very accurate history from the nursing home. Duration: uknown as EMS could not obtain a very accurate history from the nursing hom. 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 Diabetes mellitus   . Hypertension   . Testicular cancer dx'd 08/2010    surg only  . CHF (congestive heart failure)   . CAD (coronary artery disease)     s/p stent to OM1 remotely, DES to mRCA 2006  . Peripheral vascular disease   . Vitamin B12 deficiency   . Diabetic retinopathy   . Diabetic peripheral neuropathy   . Cholelithiasis   . Family history of colon cancer     brother   . 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 Osteomyelitis of finger of right hand 03/2013    R middle finger  . Pressure ulcer of buttock 03/2013    stage 2  . Dysphagia     pt eats a mechanical diet    Past Surgical History   Procedure Laterality Date  . Below knee leg amputation  2005    Right  . Below knee leg amputation  2006    Left  . Appendectomy    . Eye surgery  2012    Laser  . Ptca    . Trudee Kuster hole Right 03/04/2013    Procedure: Haskell Flirt;  Surgeon: Charlie Pitter, MD;  Location: [place] [facility];  Service: Neurosurgery;  Laterality: Right;  Burr Holes   . Tee without cardioversion N/A 12/20/2013    Procedure: TRANSESOPHAGEAL ECHOCARDIOGRAM (TEE);  Surgeon: Lelon Perla, MD;  Location: [facility];  Service: Cardiovascular;  Laterality: N/A;   Family History  Problem Relation Age of Onset  . Colon cancer Brother    History  Substance Use Topics  . Smoking status: Former Research scientist (life sciences)  . Smokeless tobacco: Never Used     Comment: quit smoking 30 years ago  . Alcohol Use: No    Review of Systems  Unable to perform ROS: Acuity of condition      Allergies  Ativan and Benadryl  Home Medications   Prior to Admission medications   Medication Sig Start Date End Date Taking? Authorizing Provider  aspirin EC 81 MG EC tablet Take 1 tablet (81 mg total) by mouth daily. 12/24/13   Shanker Kristeen Mans, MD  atorvastatin (LIPITOR) 20  MG tablet Take 20 mg by mouth at bedtime.    Historical Provider, MD  carvedilol (COREG) 3.125 MG tablet Take 1 tablet (3.125 mg total) by mouth 2 (two) times daily with a meal. 12/24/13   Shanker Kristeen Mans, MD  clopidogrel (PLAVIX) 75 MG tablet Take 1 tablet (75 mg total) by mouth daily with breakfast. 12/24/13   Jonetta Osgood, MD  escitalopram (LEXAPRO) 10 MG tablet Take 10 mg by mouth daily.    Historical Provider, MD  ezetimibe (ZETIA) 10 MG tablet Take 10 mg by mouth every evening.     Historical Provider, MD  feeding supplement, ENSURE, (ENSURE) PUDG Take 1 Container by mouth 3 (three) times daily between meals. 01/27/14   Thurnell Lose, MD  heparin 5000 UNIT/ML injection  02/13/14   Historical Provider, MD  insulin aspart (NOVOLOG) 100 UNIT/ML injection 0-9 Units,  Subcutaneous, 3 times daily with meals CBG < 70: implement hypoglycemia protocol CBG 70 - 120: 0 units CBG 121 - 150: 1 unit CBG 151 - 200: 2 units CBG 201 - 250: 3 units CBG 251 - 300: 5 units CBG 301 - 350: 7 units CBG 351 - 400: 9 units CBG > 400: call MD 12/24/13   Jonetta Osgood, MD  insulin glargine (LANTUS) 100 UNIT/ML injection Inject 8 Units into the skin daily with breakfast.    Historical Provider, MD  levETIRAcetam (KEPPRA) 1000 MG tablet Take 1 tablet (1,000 mg total) by mouth 2 (two) times daily. 01/27/14   Thurnell Lose, MD  lisinopril (PRINIVIL,ZESTRIL) 2.5 MG tablet Take 1 tablet (2.5 mg total) by mouth daily. 12/24/13   Shanker Kristeen Mans, MD  Maltodextrin-Xanthan Gum (RESOURCE THICKENUP CLEAR) POWD Use thickening liquids as needed 01/27/14   Thurnell Lose, MD  oxybutynin (DITROPAN-XL) 5 MG 24 hr tablet Take 5 mg by mouth at bedtime.    Historical Provider, MD  phenytoin (DILANTIN) 100 MG/4ML suspension Take 125 mg by mouth 3 (three) times daily. Give 4 ml by mouth 2 times a day for seizure    Historical Provider, MD  simvastatin (ZOCOR) 40 MG tablet  11/29/13   Historical Provider, MD  solifenacin (VESICARE) 5 MG tablet Take 5 mg by mouth daily.    Historical Provider, MD  Tamsulosin HCl (FLOMAX) 0.4 MG CAPS Take 0.4 mg by mouth daily.      Historical Provider, MD  Vitamin D, Ergocalciferol, (DRISDOL) 50000 UNITS CAPS capsule Take 50,000 Units by mouth every 7 (seven) days. On Wednesday    Historical Provider, MD   BP 81/57  Pulse 86  Temp(Src) 98.1 F (36.7 C) (Core (Comment))  Resp 20  Ht 6' (1.829 m)  Wt 199 lb 4.7 oz (90.4 kg)  BMI 27.02 kg/m2  SpO2 100% Physical Exam  Constitutional: He appears distressed.  Responds to pain only  HENT:  Right Ear: External ear normal.  Left Ear: External ear normal.  Eyes: Pupils are equal, round, and reactive to light.  Neck: No JVD present. No tracheal deviation present.  Cardiovascular: Regular rhythm, S1 normal and  S2 normal.  Tachycardia present.  Exam reveals no distant heart sounds.   Pulmonary/Chest: Accessory muscle usage present. Tachypnea noted. He is in respiratory distress. He has rhonchi in the right upper field, the right middle field, the right lower field, the left upper field, the left middle field and the left lower field. He has rales in the right lower field and the left lower field.  Abdominal: Soft. He exhibits  no distension.  Musculoskeletal:  Bilateral BKA  Neurological: He is unresponsive. GCS eye subscore is 2. GCS verbal subscore is 1. GCS motor subscore is 4.  Skin: Skin is warm and dry.    ED Course  Procedures (including critical care time) Labs Review Labs Reviewed  PROTIME-INR - Abnormal; Notable for the following:    Prothrombin Time 15.4 (*)    All other components within normal limits  CBC - Abnormal; Notable for the following:    WBC 15.2 (*)    MCV 103.5 (*)    All other components within normal limits  DIFFERENTIAL - Abnormal; Notable for the following:    Neutrophils Relative % 87 (*)    Neutro Abs 13.2 (*)    Lymphocytes Relative 8 (*)    All other components within normal limits  COMPREHENSIVE METABOLIC PANEL - Abnormal; Notable for the following:    Sodium 150 (*)    Glucose, Bld 437 (*)    BUN 55 (*)    Creatinine, Ser 2.07 (*)    Albumin 2.1 (*)    GFR calc non Af Amer 29 (*)    GFR calc Af Amer 34 (*)    Anion gap 16 (*)    All other components within normal limits  URINALYSIS, ROUTINE W REFLEX MICROSCOPIC - Abnormal; Notable for the following:    Color, Urine ORANGE (*)    APPearance CLOUDY (*)    Glucose, UA 250 (*)    Hgb urine dipstick MODERATE (*)    Bilirubin Urine SMALL (*)    Ketones, ur 15 (*)    Leukocytes, UA TRACE (*)    All other components within normal limits  PHENYTOIN LEVEL, TOTAL - Abnormal; Notable for the following:    Phenytoin Lvl <2.5 (*)    All other components within normal limits  PRO B NATRIURETIC PEPTIDE -  Abnormal; Notable for the following:    Pro B Natriuretic peptide (BNP) 865.8 (*)    All other components within normal limits  URINE MICROSCOPIC-ADD ON - Abnormal; Notable for the following:    Squamous Epithelial / LPF FEW (*)    All other components within normal limits  CBC - Abnormal; Notable for the following:    WBC 11.7 (*)    RBC 3.76 (*)    Hemoglobin 12.3 (*)    MCV 105.3 (*)    All other components within normal limits  COMPREHENSIVE METABOLIC PANEL - Abnormal; Notable for the following:    Sodium 156 (*)    Chloride 120 (*)    Glucose, Bld 353 (*)    BUN 45 (*)    Creatinine, Ser 1.52 (*)    Calcium 7.8 (*)    Total Protein 5.8 (*)    Albumin 1.7 (*)    GFR calc non Af Amer 42 (*)    GFR calc Af Amer 49 (*)    All other components within normal limits  LIPASE, BLOOD - Abnormal; Notable for the following:    Lipase 9 (*)    All other components within normal limits  PROTIME-INR - Abnormal; Notable for the following:    Prothrombin Time 15.9 (*)    All other components within normal limits  GLUCOSE, CAPILLARY - Abnormal; Notable for the following:    Glucose-Capillary 361 (*)    All other components within normal limits  I-STAT CHEM 8, ED - Abnormal; Notable for the following:    Sodium 150 (*)    Chloride 114 (*)  BUN 50 (*)    Creatinine, Ser 2.00 (*)    Glucose, Bld 417 (*)    All other components within normal limits  I-STAT CG4 LACTIC ACID, ED - Abnormal; Notable for the following:    Lactic Acid, Venous 3.88 (*)    All other components within normal limits  CBG MONITORING, ED - Abnormal; Notable for the following:    Glucose-Capillary 354 (*)    All other components within normal limits  I-STAT ARTERIAL BLOOD GAS, ED - Abnormal; Notable for the following:    pO2, Arterial 368.0 (*)    All other components within normal limits  URINE CULTURE  CULTURE, BLOOD (ROUTINE X 2)  CULTURE, BLOOD (ROUTINE X 2)  MRSA PCR SCREENING  ETHANOL  APTT  URINE  RAPID DRUG SCREEN (HOSP PERFORMED)  TROPONIN I  MAGNESIUM  PHOSPHORUS  STREP PNEUMONIAE URINARY ANTIGEN  BASIC METABOLIC PANEL  BASIC METABOLIC PANEL  BASIC METABOLIC PANEL  LEGIONELLA ANTIGEN, URINE  BASIC METABOLIC PANEL  MAGNESIUM  PHOSPHORUS  CBC  I-STAT TROPOININ, ED  I-STAT CG4 LACTIC ACID, ED    Imaging Review Ct Head Wo Contrast  04/04/2014   CLINICAL DATA:  80 year old male is unresponsive. Initial encounter.  EXAM: CT HEAD WITHOUT CONTRAST  TECHNIQUE: Contiguous axial images were obtained from the base of the skull through the vertex without intravenous contrast.  COMPARISON:  CTA head 01/24/2014 and earlier.  FINDINGS: Intubated with oral enteric tube in place on the scout view.  Mildly increased paranasal sinus mucosal thickening. No acute osseous abnormality identified. Chronic right frontal burr hole again noted. No acute orbit or scalp soft tissue findings. Calcified atherosclerosis at the skull base.  Mixed density right subdural hematoma and/or chronic dural thickening has not significantly changed. Trace leftward midline shift is stable. No ventriculomegaly. Progressed encephalomalacia in the left MCA territory, particularly in the left parietal lobe. Left ICA occlusion demonstrated on the prior. No new intracranial hemorrhage identified. No definite acute cortically based infarct Stable small chronic right cerebellar infarct. Stable chronic left PCA infarct. Stable small chronic lacunar right thalamus.  IMPRESSION: 1. No definite acute intracranial abnormality. Progressed left MCA territory encephalomalacia since June at which time left ICA occlusion was demonstrated by CTA. 2. Stable chronic right subdural/dural thickening. No new intracranial hemorrhage or significant intracranial mass effect. 3. Underlying advanced chronic ischemic disease.   Electronically Signed   By: Lars Pinks M.D.   On: 04/04/2014 18:29   Dg Chest Portable 1 View  04/04/2014   CLINICAL DATA:  Increased  oral secretions. Concern for aspiration. Sepsis.  EXAM: PORTABLE CHEST - 1 VIEW  COMPARISON:  Of 04/04/2014  FINDINGS: Endotracheal tube tip measures 5.7 cm above the carinal. Enteric tube tip is off the field of view but is below the left hemidiaphragm. Shallow inspiration. There is atelectasis or infiltration in both lung bases, demonstrating mild increase since previous study. Changes are nonspecific but compatible with aspiration in the appropriate clinical setting. No pneumothorax. Normal heart size and pulmonary vascularity.  IMPRESSION: Shallow inspiration with increasing atelectasis or infiltration in the lung bases since prior study. Appliances appear to be in satisfactory location.   Electronically Signed   By: Lucienne Capers M.D.   On: 04/04/2014 21:14   Dg Chest Portable 1 View  04/04/2014   CLINICAL DATA:  Unresponsive.  Intubated patient.  EXAM: PORTABLE CHEST - 1 VIEW  COMPARISON:  CT 02/13/2014.  Radiographs 01/24/2014.  FINDINGS: 1737 hr. There are low lung volumes with mild  patient rotation to the right. Endotracheal tube tip is approximately 4 cm above the carina. A nasogastric tube projects below the diaphragm, tip not visualized. There are increased bibasilar airspace opacities. No pneumothorax or significant pleural effusion is seen. The heart size and mediastinal contours are stable.  IMPRESSION: Satisfactorily positioned endotracheal and nasogastric tubes. Increased bibasilar airspace opacities may reflect atelectasis, aspiration or pneumonia.   Electronically Signed   By: Camie Patience M.D.   On: 04/04/2014 17:58   Dg Abd Portable 1v  04/04/2014   CLINICAL DATA:  Unresponsive.  EXAM: PORTABLE ABDOMEN - 1 VIEW  COMPARISON:  CT 02/13/2014  FINDINGS: Mild gaseous distention of the transverse colon. Moderate stool in the colon. No evidence of bowel obstruction. No supine evidence of free air organomegaly. No acute bony abnormality. Degenerative changes in the lumbar spine and hips.   IMPRESSION: No evidence of obstruction. Moderate stool burden and gaseous distention of the colon.   Electronically Signed   By: Rolm Baptise M.D.   On: 04/04/2014 17:58     EKG Interpretation   Date/Time:  Friday April 04 2014 17:06:54 EDT Ventricular Rate:  104 PR Interval:  122 QRS Duration: 83 QT Interval:  346 QTC Calculation: 455 R Axis:   -44 Text Interpretation:  Sinus tachycardia Inferior infarct, old No  significant change since last tracing Confirmed by Christy Gentles  MD, [place]  [phone]) on 04/04/2014 5:23:50 PM      INTUBATION Performed by: Kelby Aline  Required items: required blood products, implants, devices, and special equipment available Patient identity confirmed: provided demographic data and hospital-assigned identification number Time out: Immediately prior to procedure a "time out" was called to verify the correct patient, procedure, equipment, support staff and site/side marked as required.  Indications: respiratory failure  Intubation method: Glidescope Laryngoscopy   Preoxygenation: BVM  Sedatives: Etomidate Paralytic: Rocuronium  Tube Size:  cuffed  Post-procedure assessment: chest rise and ETCO2 monitor Breath sounds: equal and absent over the epigastrium Tube secured with: ETT holder Chest x-ray interpreted by radiologist and me.  Chest x-ray findings: endotracheal tube in appropriate position  Patient tolerated the procedure well with no immediate complications.     MDM   Final diagnoses:  HCAP (healthcare-associated pneumonia)  Acute respiratory failure with hypoxia  Septic shock    Patient presents for AMS with acute respiratory and septic shock likely due to PNA. Sonorous respirations on arrival. IV access difficult but obtained under US guidance by myself. Started IVF as well. Intubated as he cannot proect his airway. Gross pus noted coming from the airway. Intubated successfully. Hypotensive on arrival but responded to 2 L IVF  and maintenance fluids. Started HCAP abx and code sepsis initiated. CT head NAICA. + lactic acidosis, leukocytosis. Likely diagnosis as mentioned above. Admitted to the ICU without any events for acute respiratory and septic shock likely due to PNA. Care discussed with my attending, Dr. Christy Gentles.     Kelby Aline, MD 04/04/14 2337

## 2014-04-05 ENCOUNTER — Encounter (HOSPITAL_COMMUNITY): Payer: Self-pay | Admitting: *Deleted

## 2014-04-05 DIAGNOSIS — I517 Cardiomegaly: Secondary | ICD-10-CM

## 2014-04-05 DIAGNOSIS — I5022 Chronic systolic (congestive) heart failure: Secondary | ICD-10-CM

## 2014-04-05 DIAGNOSIS — J189 Pneumonia, unspecified organism: Secondary | ICD-10-CM

## 2014-04-05 DIAGNOSIS — R4020 Unspecified coma: Secondary | ICD-10-CM

## 2014-04-05 DIAGNOSIS — J9600 Acute respiratory failure, unspecified whether with hypoxia or hypercapnia: Secondary | ICD-10-CM

## 2014-04-05 LAB — BASIC METABOLIC PANEL
ANION GAP: 14 (ref 5–15)
Anion gap: 14 (ref 5–15)
Anion gap: 10 (ref 5–15)
BUN: 38 mg/dL — AB (ref 6–23)
BUN: 42 mg/dL — ABNORMAL HIGH (ref 6–23)
BUN: 44 mg/dL — ABNORMAL HIGH (ref 6–23)
CALCIUM: 7.9 mg/dL — AB (ref 8.4–10.5)
CALCIUM: 8.5 mg/dL (ref 8.4–10.5)
CHLORIDE: 123 meq/L — AB (ref 96–112)
CO2: 18 mEq/L — ABNORMAL LOW (ref 19–32)
CO2: 20 mEq/L (ref 19–32)
CO2: 21 meq/L (ref 19–32)
CREATININE: 1.24 mg/dL (ref 0.50–1.35)
CREATININE: 1.38 mg/dL — AB (ref 0.50–1.35)
Calcium: 7.6 mg/dL — ABNORMAL LOW (ref 8.4–10.5)
Chloride: 119 mEq/L — ABNORMAL HIGH (ref 96–112)
Chloride: 122 mEq/L — ABNORMAL HIGH (ref 96–112)
Creatinine, Ser: 1.25 mg/dL (ref 0.50–1.35)
GFR calc Af Amer: 55 mL/min — ABNORMAL LOW (ref >90)
GFR calc Af Amer: 62 mL/min — ABNORMAL LOW (ref >90)
GFR calc Af Amer: 62 mL/min — ABNORMAL LOW (ref >90)
GFR calc non Af Amer: 47 mL/min — ABNORMAL LOW (ref >90)
GFR calc non Af Amer: 54 mL/min — ABNORMAL LOW (ref >90)
GFR, EST NON AFRICAN AMERICAN: 53 mL/min — AB (ref >90)
GLUCOSE: 218 mg/dL — AB (ref 70–99)
GLUCOSE: 155 mg/dL — AB (ref 70–99)
Glucose, Bld: 309 mg/dL — ABNORMAL HIGH (ref 70–99)
POTASSIUM: 4 meq/L (ref 3.7–5.3)
Potassium: 3.8 mEq/L (ref 3.7–5.3)
Potassium: 4 mEq/L (ref 3.7–5.3)
SODIUM: 153 meq/L — AB (ref 137–147)
SODIUM: 154 meq/L — AB (ref 137–147)
Sodium: 154 mEq/L — ABNORMAL HIGH (ref 137–147)

## 2014-04-05 LAB — GLUCOSE, CAPILLARY
GLUCOSE-CAPILLARY: 164 mg/dL — AB (ref 70–99)
GLUCOSE-CAPILLARY: 197 mg/dL — AB (ref 70–99)
GLUCOSE-CAPILLARY: 245 mg/dL — AB (ref 70–99)
GLUCOSE-CAPILLARY: 287 mg/dL — AB (ref 70–99)
Glucose-Capillary: 140 mg/dL — ABNORMAL HIGH (ref 70–99)
Glucose-Capillary: 153 mg/dL — ABNORMAL HIGH (ref 70–99)
Glucose-Capillary: 162 mg/dL — ABNORMAL HIGH (ref 70–99)
Glucose-Capillary: 183 mg/dL — ABNORMAL HIGH (ref 70–99)
Glucose-Capillary: 216 mg/dL — ABNORMAL HIGH (ref 70–99)
Glucose-Capillary: 229 mg/dL — ABNORMAL HIGH (ref 70–99)
Glucose-Capillary: 235 mg/dL — ABNORMAL HIGH (ref 70–99)
Glucose-Capillary: 317 mg/dL — ABNORMAL HIGH (ref 70–99)
Glucose-Capillary: 320 mg/dL — ABNORMAL HIGH (ref 70–99)

## 2014-04-05 LAB — CBC
HCT: 35.2 % — ABNORMAL LOW (ref 39.0–52.0)
HEMOGLOBIN: 11.2 g/dL — AB (ref 13.0–17.0)
MCH: 32.4 pg (ref 26.0–34.0)
MCHC: 31.8 g/dL (ref 30.0–36.0)
MCV: 101.7 fL — ABNORMAL HIGH (ref 78.0–100.0)
Platelets: 192 10*3/uL (ref 150–400)
RBC: 3.46 MIL/uL — ABNORMAL LOW (ref 4.22–5.81)
RDW: 14.8 % (ref 11.5–15.5)
WBC: 10.9 10*3/uL — AB (ref 4.0–10.5)

## 2014-04-05 LAB — LEGIONELLA ANTIGEN, URINE: Legionella Antigen, Urine: NEGATIVE

## 2014-04-05 LAB — MAGNESIUM: MAGNESIUM: 2 mg/dL (ref 1.5–2.5)

## 2014-04-05 LAB — PHOSPHORUS: PHOSPHORUS: 2.8 mg/dL (ref 2.3–4.6)

## 2014-04-05 MED ORDER — VANCOMYCIN HCL IN DEXTROSE 750-5 MG/150ML-% IV SOLN
750.0000 mg | Freq: Two times a day (BID) | INTRAVENOUS | Status: DC
  Administered 2014-04-05 (×2): 750 mg via INTRAVENOUS
  Filled 2014-04-05 (×4): qty 150

## 2014-04-05 MED ORDER — SODIUM CHLORIDE 0.9 % IV BOLUS (SEPSIS)
500.0000 mL | Freq: Once | INTRAVENOUS | Status: AC
  Administered 2014-04-05: 500 mL via INTRAVENOUS

## 2014-04-05 MED ORDER — MUPIROCIN 2 % EX OINT
1.0000 "application " | TOPICAL_OINTMENT | Freq: Two times a day (BID) | CUTANEOUS | Status: AC
  Administered 2014-04-05 – 2014-04-09 (×10): 1 via NASAL
  Filled 2014-04-05 (×2): qty 22

## 2014-04-05 MED ORDER — FREE WATER
250.0000 mL | Status: DC
  Administered 2014-04-05 – 2014-04-06 (×6): 250 mL

## 2014-04-05 MED ORDER — SODIUM CHLORIDE 0.9 % IV BOLUS (SEPSIS)
1000.0000 mL | Freq: Once | INTRAVENOUS | Status: AC
  Administered 2014-04-05: 1000 mL via INTRAVENOUS

## 2014-04-05 MED ORDER — INSULIN ASPART 100 UNIT/ML ~~LOC~~ SOLN
0.0000 [IU] | SUBCUTANEOUS | Status: DC
  Administered 2014-04-05 (×2): 3 [IU] via SUBCUTANEOUS
  Administered 2014-04-05: 2 [IU] via SUBCUTANEOUS
  Administered 2014-04-05: 3 [IU] via SUBCUTANEOUS
  Administered 2014-04-05 – 2014-04-06 (×3): 5 [IU] via SUBCUTANEOUS
  Administered 2014-04-06 (×3): 2 [IU] via SUBCUTANEOUS
  Administered 2014-04-06: 3 [IU] via SUBCUTANEOUS
  Administered 2014-04-07 (×4): 2 [IU] via SUBCUTANEOUS
  Administered 2014-04-08: 5 [IU] via SUBCUTANEOUS
  Administered 2014-04-08 – 2014-04-09 (×4): 2 [IU] via SUBCUTANEOUS
  Administered 2014-04-09: 3 [IU] via SUBCUTANEOUS
  Administered 2014-04-10: 5 [IU] via SUBCUTANEOUS
  Administered 2014-04-10 (×3): 3 [IU] via SUBCUTANEOUS

## 2014-04-05 MED ORDER — LEVETIRACETAM 100 MG/ML PO SOLN
1000.0000 mg | Freq: Two times a day (BID) | ORAL | Status: DC
  Administered 2014-04-05 – 2014-04-06 (×3): 1000 mg
  Filled 2014-04-05 (×5): qty 10

## 2014-04-05 MED ORDER — CHLORHEXIDINE GLUCONATE CLOTH 2 % EX PADS
6.0000 | MEDICATED_PAD | Freq: Every day | CUTANEOUS | Status: AC
  Administered 2014-04-05 – 2014-04-09 (×5): 6 via TOPICAL

## 2014-04-05 NOTE — Progress Notes (Signed)
BMET collected 04/05/14 @ 0019 is showing results under incorrect date (8/28 @ 0019)- lab aware and attempting to fix problem. BMET results are as follows:  Results for [name], [name] (MRN [id number]) as of 04/05/2014 03:10  Ref. Range 04/05/2014 00:19  Sodium Latest Range: 136-145 mEq/L 153 (H)  Potassium Latest Range: 3.5-5.1 mEq/L 4.0  Chloride Latest Range: 96-112 mEq/L 119 (H)  CO2 Latest Range: 19-32 mEq/L 20  BUN Latest Range: 7.0-26.0 mg/dL 44 (H)  Creatinine Latest Range: 0.50-1.35 mg/dL 1.38 (H)  Calcium Latest Range: 8.4-10.5 mg/dL 8.5  GFR calc non Af Amer Latest Range: >90 mL/min 47 (L)  GFR calc Af Amer Latest Range: >90 mL/min 55 (L)  Glucose Latest Range: 70-99 mg/dl 309 (H)  Anion gap Latest Range: 5-15  14    Blair Hailey, RN

## 2014-04-05 NOTE — Progress Notes (Signed)
PULMONARY / CRITICAL CARE MEDICINE HISTORY AND PHYSICAL EXAMINATION   Name: [name] MRN: [id number] DOB: 04-Jul-1933    ADMISSION DATE:  04/04/2014  PRIMARY SERVICE: PCCM  CHIEF COMPLAINT:  AMS  BRIEF PATIENT DESCRIPTION: 80 y/o man with heart failure and multiple strokes presenting with altered mental status, sepsis of pulmonary origin, HHS, and inability to protect his airway.  SIGNIFICANT EVENTS / STUDIES:  Intubated in the ED for low GCS  LINES / TUBES: ETT - placed in ED 8/28 PIV x2 (18 ga) OGT - placed in ED 8/28  CULTURES: Blood cx from ED x2 - 8/28>>> Urine cx from ED - 8/28>>>  ANTIBIOTICS: Cefepime - 8/28->>> Vanc - 8/28->>>  SUBJECTIVE: sedated on vent   VITAL SIGNS: Temp:  [94.5 F (34.7 C)-100 F (37.8 C)] 98.3 F (36.8 C) (08/29 0757) Pulse Rate:  [80-104] 98 (08/29 0816) Resp:  [14-24] 14 (08/29 0816) BP: (58-180)/(30-90) 90/42 mmHg (08/29 0816) SpO2:  [95 %-100 %] 100 % (08/29 0816) FiO2 (%):  [30 %-100 %] 30 % (08/29 0816) Weight:  [90.4 kg (199 lb 4.7 oz)-94.8 kg (208 lb 15.9 oz)] 94.8 kg (208 lb 15.9 oz) (08/29 0500) HEMODYNAMICS:   VENTILATOR SETTINGS: Vent Mode:  [-] CPAP;PSV FiO2 (%):  [30 %-100 %] 30 % Set Rate:  [16 bmp] 16 bmp Vt Set:  [580 mL-620 mL] 580 mL PEEP:  [5 cmH20] 5 cmH20 Pressure Support:  [8 cmH20] 8 cmH20 Plateau Pressure:  [16 cmH20-21 cmH20] 17 cmH20 INTAKE / OUTPUT: Intake/Output     08/28 0701 - 08/29 0700 08/29 0701 - 08/30 0700   I.V. (mL/kg) 4764 (50.3) 75 (0.8)   NG/GT 30 30   IV Piggyback 3300    Total Intake(mL/kg) 8094 (85.4) 105 (1.1)   Urine (mL/kg/hr) 1335 80 (0.3)   Emesis/NG output 150    Total Output 1485 80   Net +6609 +25          PHYSICAL EXAMINATION: General:  Elderly, chorically ill-appearing man. Intubated. Neuro: Opens eyes to voice, no spontaneous movement of R side. Non-purposeful movement of LUE. HEENT:  Temporal wasting noted. Dry mucous membranes. Neck: No  JVD Cardiovascular: Cool extremities. RRR. Lungs:scattered rhonchi  Abdomen:  Non-distended. Musculoskeletal:  Bilateral BKAs noted. Skin: cool and clammy. No rashes.  LABS:  CBC  Recent Labs Lab 04/04/14 1706 04/04/14 1735 04/04/14 2147 04/05/14 0610  WBC 15.2*  --  11.7* 10.9*  HGB 15.3 16.7 12.3* 11.2*  HCT 47.3 49.0 39.6 35.2*  PLT 301  --  203 192   Coag's  Recent Labs Lab 04/04/14 1706 04/04/14 2147  APTT 30  --   INR 1.22 1.27   BMET  Recent Labs Lab 04/04/14 2147 04/05/14 0155 04/05/14 0610  NA 156* 154* 154*  K 3.9 3.8 4.0  CL 120* 122* 123*  CO2 21 18* 21  BUN 45* 42* 38*  CREATININE 1.52* 1.24 1.25  GLUCOSE 353* 218* 155*   Electrolytes  Recent Labs Lab 04/04/14 2147 04/05/14 0155 04/05/14 0610  CALCIUM 7.8* 7.9* 7.6*  MG 2.0  --  2.0  PHOS 3.9  --  2.8   Sepsis Markers  Recent Labs Lab 04/04/14 1735  LATICACIDVEN 3.88*   ABG  Recent Labs Lab 04/04/14 1841  PHART 7.403  PCO2ART 36.4  PO2ART 368.0*   Liver Enzymes  Recent Labs Lab 04/04/14 1706 04/04/14 2147  AST 29 30  ALT 30 25  ALKPHOS 110 90  BILITOT 0.6 0.5  ALBUMIN 2.1* 1.7*  Cardiac Enzymes  Recent Labs Lab 04/04/14 1706 04/04/14 1740  TROPONINI <0.30  --   PROBNP  --  865.8*   Glucose  Recent Labs Lab 04/05/14 0038 04/05/14 0143 04/05/14 0236 04/05/14 0343 04/05/14 0627 04/05/14 0752  GLUCAP 229* 245* 164* 153* 140* 162*    Imaging Ct Head Wo Contrast  04/04/2014   CLINICAL DATA:  80 year old male is unresponsive. Initial encounter.  EXAM: CT HEAD WITHOUT CONTRAST  TECHNIQUE: Contiguous axial images were obtained from the base of the skull through the vertex without intravenous contrast.  COMPARISON:  CTA head 01/24/2014 and earlier.  FINDINGS: Intubated with oral enteric tube in place on the scout view.  Mildly increased paranasal sinus mucosal thickening. No acute osseous abnormality identified. Chronic right frontal burr hole again  noted. No acute orbit or scalp soft tissue findings. Calcified atherosclerosis at the skull base.  Mixed density right subdural hematoma and/or chronic dural thickening has not significantly changed. Trace leftward midline shift is stable. No ventriculomegaly. Progressed encephalomalacia in the left MCA territory, particularly in the left parietal lobe. Left ICA occlusion demonstrated on the prior. No new intracranial hemorrhage identified. No definite acute cortically based infarct Stable small chronic right cerebellar infarct. Stable chronic left PCA infarct. Stable small chronic lacunar right thalamus.  IMPRESSION: 1. No definite acute intracranial abnormality. Progressed left MCA territory encephalomalacia since June at which time left ICA occlusion was demonstrated by CTA. 2. Stable chronic right subdural/dural thickening. No new intracranial hemorrhage or significant intracranial mass effect. 3. Underlying advanced chronic ischemic disease.   Electronically Signed   By: Lars Pinks M.D.   On: 04/04/2014 18:29   Dg Chest Portable 1 View  04/04/2014   CLINICAL DATA:  Increased oral secretions. Concern for aspiration. Sepsis.  EXAM: PORTABLE CHEST - 1 VIEW  COMPARISON:  Of 04/04/2014  FINDINGS: Endotracheal tube tip measures 5.7 cm above the carinal. Enteric tube tip is off the field of view but is below the left hemidiaphragm. Shallow inspiration. There is atelectasis or infiltration in both lung bases, demonstrating mild increase since previous study. Changes are nonspecific but compatible with aspiration in the appropriate clinical setting. No pneumothorax. Normal heart size and pulmonary vascularity.  IMPRESSION: Shallow inspiration with increasing atelectasis or infiltration in the lung bases since prior study. Appliances appear to be in satisfactory location.   Electronically Signed   By: Lucienne Capers M.D.   On: 04/04/2014 21:14   Dg Chest Portable 1 View  04/04/2014   CLINICAL DATA:  Unresponsive.   Intubated patient.  EXAM: PORTABLE CHEST - 1 VIEW  COMPARISON:  CT 02/13/2014.  Radiographs 01/24/2014.  FINDINGS: 1737 hr. There are low lung volumes with mild patient rotation to the right. Endotracheal tube tip is approximately 4 cm above the carina. A nasogastric tube projects below the diaphragm, tip not visualized. There are increased bibasilar airspace opacities. No pneumothorax or significant pleural effusion is seen. The heart size and mediastinal contours are stable.  IMPRESSION: Satisfactorily positioned endotracheal and nasogastric tubes. Increased bibasilar airspace opacities may reflect atelectasis, aspiration or pneumonia.   Electronically Signed   By: Camie Patience M.D.   On: 04/04/2014 17:58   Dg Abd Portable 1v  04/04/2014   CLINICAL DATA:  Unresponsive.  EXAM: PORTABLE ABDOMEN - 1 VIEW  COMPARISON:  CT 02/13/2014  FINDINGS: Mild gaseous distention of the transverse colon. Moderate stool in the colon. No evidence of bowel obstruction. No supine evidence of free air organomegaly. No acute bony abnormality. Degenerative  changes in the lumbar spine and hips.  IMPRESSION: No evidence of obstruction. Moderate stool burden and gaseous distention of the colon.   Electronically Signed   By: Rolm Baptise M.D.   On: 04/04/2014 17:58    EKG: Low voltage inferiorly. CXR: Low volumes, RLL infiltrate.  ASSESSMENT / PLAN:  Active Problems:   HCAP (healthcare-associated pneumonia)   Pneumonia   PULMONARY A: Hypoxic Respiratory Failure Need for mechanical ventilation Healthcare associated Pneumonia versus aspiration Suspect patient aspirated due to AMS.  Weaning but still lots of tracheal secretions  P: Will maintain on ventilator for now. F/u cxr and abg See ID sections Cont PSV hope to extubate next 24 hours. Would like to discuss w/ family first    CARDIOVASCULAR A: Chronic Systolic Heart Failure Septic shock  P:   Fluids and abx only  No escalation Full DNR    RENAL A: AKI-->improved  Hypernatremia  P:   Due to hypovolemia. Hydrate as above. Add free water via tube  F/u chem in am   GASTROINTESTINAL A: Dysphagia  prob aspiration  P:   Will start tube feeds  HEMATOLOGIC A: Leukocytosis P:   Due to sepsis  INFECTIOUS A: Severe Sepsis due to HCAP/aspiration  P:   F/u culture data (see above) vanc started 8/28>>> Zosyn started 8/28>>>  ENDOCRINE A: Hyperglycemic Hyperosmolar State Now off gtt.  P:   ssi protocol   NEUROLOGIC A: Metabolic Encephalopathy, possible stroke CT head negative. Seizure disorder P:    Treat underlying cause. Given ASA / Plavix. Not a candidate for revascularization therapy. Continue home keppra and dilantin   TODAY'S SUMMARY: 80 y/o man with multiple co morbidities due to vascular disease presenting with sepsis, AKI, and HHS. Now full DNR w/ no escalation.   I have personally obtained a history, examined the patient, evaluated laboratory and imaging results, formulated the assessment and plan and placed orders.  CRITICAL CARE: The patient is critically ill with multiple organ systems failure and requires high complexity decision making for assessment and support, frequent evaluation and titration of therapies, application of advanced monitoring technologies and extensive interpretation of multiple databases. Critical Care Time devoted to patient care services described in this note is 35 minutes.   Rush Farmer, M.D. [facility] Pulmonary/Critical Care Medicine. Pager: [phone]. After hours pager: [phone].  04/05/2014, 9:52 AM

## 2014-04-05 NOTE — Progress Notes (Signed)
Most recent BMET and glucose results called to MD/e-link; received order to d/c insulin gtt, check CBG q4h w/ standard SSI coverage; no Lantus order at this time; continue to monitor... Blair Hailey, RN

## 2014-04-05 NOTE — Progress Notes (Signed)
ANTIBIOTIC CONSULT NOTE - FOLLOW UP  Pharmacy Consult:  Vancomycin / Zosyn Indication:  Sepsis from possible aspiration PNA  Allergies  Allergen Reactions  . Ativan [Lorazepam] Other (See Comments)    Very lethargic  . Benadryl [Diphenhydramine Hcl] Other (See Comments)    Very lethargic    Patient Measurements: Height: 6' (182.9 cm) Weight: 208 lb 15.9 oz (94.8 kg) IBW/kg (Calculated) : 77.6  Vital Signs: Temp: 98.3 F (36.8 C) (08/29 0757) Temp src: Oral (08/29 0757) BP: 85/35 mmHg (08/29 1000) Pulse Rate: 84 (08/29 1000) Intake/Output from previous day: 08/28 0701 - 08/29 0700 In: 8094 [I.V.:4764; NG/GT:30; IV Piggyback:3300] Out: 2947 [Urine:1335; Emesis/NG output:150] Intake/Output from this shift: Total I/O In: 105 [I.V.:75; NG/GT:30] Out: 80 [Urine:80]  Labs:  Recent Labs  04/04/14 1706 04/04/14 1735 04/04/14 2147 04/05/14 0155 04/05/14 0610  WBC 15.2*  --  11.7*  --  10.9*  HGB 15.3 16.7 12.3*  --  11.2*  PLT 301  --  203  --  192  CREATININE 2.07* 2.00* 1.52* 1.24 1.25   Estimated Creatinine Clearance: 58.2 ml/min (by C-G formula based on Cr of 1.25). No results found for this basename: VANCOTROUGH, Corlis Leak, VANCORANDOM, [place], [place], [place], [place], [place], TOBRARND, AMIKACINPEAK, AMIKACINTROU, AMIKACIN,  in the last 72 hours   Microbiology: Recent Results (from the past 720 hour(s))  MRSA PCR SCREENING     Status: Abnormal   Collection Time    04/04/14  9:17 PM      Result Value Ref Range Status   MRSA by PCR POSITIVE (*) NEGATIVE Final   Comment:            The GeneXpert MRSA Assay (FDA     approved for NASAL specimens     only), is one component of a     comprehensive MRSA colonization     surveillance program. It is not     intended to diagnose MRSA     infection nor to guide or     monitor treatment for     MRSA infections.     RESULT CALLED TO, READ BACK BY AND VERIFIED WITH:     L JAMES,RN [phone] [place]       Assessment: 33 YOM admitted s/p found unresponsive for unknown period of time.  Patient was started on broad spectrum antibiotics for sepsis due to HCAP/aspiration.  Patient's renal function improving overall.  Vanc 8/28 >> Zosyn 8/28 >>  8/28 MRSA PCR - positive 8/28 UCx - 8/28 BCx x2 -   Goal of Therapy:  Vancomycin trough level 15-20 mcg/ml   Plan:  - Change vanc to 750mg  IV Q12H - Continue Zosyn 3.375gm IV Q8H, 4 hr infurion - Monitor renal fxn, clinical progress, vanc trough as indicated - F/U resume home meds    Rozann Holts D. Mina Marble, PharmD, BCPS Pager:  [phone] 04/05/2014, 10:27 AM

## 2014-04-05 NOTE — Progress Notes (Addendum)
INITIAL NUTRITION ASSESSMENT  DOCUMENTATION CODES Per approved criteria  -Not Applicable   INTERVENTION: - If pt unable to be extubated in the next 24-48 hours and family desire TF, recommend TF via OGT of Vital 1.5 using PEPuP Protocol. Day 1 of TF protocol start feed of Vital 1.5 at 25 mL/hr for the remainder of the day with Prostat 35ml daily. Day 2 of TF protocol at 0600 start new goal rate of 92mL/hr run from 0600-05:59.  This will provide 2080 calories, 104g protein, and 1056ml free water and meet 106% estimated calorie needs and 99% estimated protein needs. If IVF d/c, recommend 26ml water flushes 5 times/day. - RD to continue to monitor   NUTRITION DIAGNOSIS: Inadequate oral intake related to inability to eat as evidenced by NPO.   Goal: Initiation of TF with goal for TF to meet >90% of estimated nutritional needs  Monitor:  Weights, labs, TF initiation, vent status, goals of care  Reason for Assessment: Ventilated pt, consult for TF recommendations and assessment   80 y.o. male  Admitting Dx: Andre Lefort   ASSESSMENT: Pt with hx of HTN, DM, CAD, CHF, and reportedly a CVA presents for AMS by EMS from nursing home after being found unresponsive. Intubated as he could not protect his airway. Gross pus came from airway during intubation.   Patient is currently intubated on ventilator support MV: 7.2 L/min Temp (24hrs), Avg:98.8 F (37.1 C), Min:94.5 F (34.7 C), Max:100 F (37.8 C)  Propofol: off   Height: Ht Readings from Last 1 Encounters:  04/04/14 6' (1.829 m)    Weight: Wt Readings from Last 1 Encounters:  04/05/14 208 lb 15.9 oz (94.8 kg)    Ideal Body Weight: 155 lbs (adjusted for bilateral BKA)  % Ideal Body Weight: 134%  Wt Readings from Last 10 Encounters:  04/05/14 208 lb 15.9 oz (94.8 kg)  01/27/14 207 lb 8 oz (94.121 kg)  01/13/14 211 lb 8 oz (95.936 kg)  12/22/13 217 lb 1.6 oz (98.476 kg)  12/22/13 217 lb 1.6 oz (98.476 kg)  03/18/13 225  lb 1.4 oz (102.1 kg)  03/18/13 225 lb 1.4 oz (102.1 kg)  03/18/13 225 lb 1.4 oz (102.1 kg)  07/19/11 258 lb 11.2 oz (117.346 kg)  01/29/08 210 lb (95.255 kg)    Usual Body Weight: 217 lbs in Mary 2015  % Usual Body Weight: 96%  BMI:  Body mass index is 28.34 kg/(m^2).  Estimated Nutritional Needs: Kcal: 1967 Protein: 105-125g Fluid: per MD  Skin: +3 RUE edema, unstageable coccyx pressure ulcer, stage 2 scrotum pressure ulcer, bilateral BKA  Diet Order: NPO  EDUCATION NEEDS: -No education needs identified at this time   Intake/Output Summary (Last 24 hours) at 04/05/14 0907 Last data filed at 04/05/14 0816  Gross per 24 hour  Intake 8199.04 ml  Output   1565 ml  Net 6634.04 ml    Last BM: PTA  Labs:   Recent Labs Lab 04/04/14 2147 04/05/14 0155 04/05/14 0610  NA 156* 154* 154*  K 3.9 3.8 4.0  CL 120* 122* 123*  CO2 21 18* 21  BUN 45* 42* 38*  CREATININE 1.52* 1.24 1.25  CALCIUM 7.8* 7.9* 7.6*  MG 2.0  --  2.0  PHOS 3.9  --  2.8  GLUCOSE 353* 218* 155*    CBG (last 3)   Recent Labs  04/05/14 0343 04/05/14 0627 04/05/14 0752  GLUCAP 153* 140* 162*    Scheduled Meds: . antiseptic oral rinse  7 mL  Mouth Rinse QID  . aspirin EC  81 mg Oral Daily  . chlorhexidine  15 mL Mouth Rinse BID  . Chlorhexidine Gluconate Cloth  6 each Topical Q0600  . clopidogrel  75 mg Oral Q breakfast  . famotidine  20 mg Oral BID  . heparin  5,000 Units Subcutaneous 3 times per day  . insulin aspart  0-15 Units Subcutaneous 6 times per day  . mupirocin ointment  1 application Nasal BID  . phenytoin  100 mg Oral BID  . piperacillin-tazobactam (ZOSYN)  IV  3.375 g Intravenous Q8H  . vancomycin  1,250 mg Intravenous Q24H    Continuous Infusions: . sodium chloride 1,000 mL (04/04/14 1742)  . sodium chloride Stopped (04/05/14 0237)  . dextrose 5 % and 0.45% NaCl 75 mL/hr at 04/05/14 7353    Past Medical History  Diagnosis Date  . Diabetes mellitus   .  Hypertension   . Testicular cancer dx'd 08/2010    surg only  . CHF (congestive heart failure)   . CAD (coronary artery disease)     s/p stent to OM1 remotely, DES to mRCA 2006  . Peripheral vascular disease   . Vitamin B12 deficiency   . Diabetic retinopathy   . Diabetic peripheral neuropathy   . Cholelithiasis   . Family history of colon cancer     brother   . 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 Osteomyelitis of finger of right hand 03/2013    R middle finger  . Pressure ulcer of buttock 03/2013    stage 2  . Dysphagia     pt eats a mechanical diet     Past Surgical History  Procedure Laterality Date  . Below knee leg amputation  2005    Right  . Below knee leg amputation  2006    Left  . Appendectomy    . Eye surgery  2012    Laser  . Ptca    . Trudee Kuster hole Right 03/04/2013    Procedure: Haskell Flirt;  Surgeon: Charlie Pitter, MD;  Location: [place] [facility];  Service: Neurosurgery;  Laterality: Right;  Burr Holes   . Tee without cardioversion N/A 12/20/2013    Procedure: TRANSESOPHAGEAL ECHOCARDIOGRAM (TEE);  Surgeon: Lelon Perla, MD;  Location: [place];  Service: Cardiovascular;  Laterality: N/A;    Carlis Stable MS, RD, LDN [phone] Weekend/After Hours Pager

## 2014-04-05 NOTE — Progress Notes (Signed)
Chaplain Note: Responded to request from ED secretary to escort family from Consultation B to 70M waiting area. Met with family after responding to request for visit from B16.  Patient's son introduced me to other family members who had arrived since my earlier visit with him. Escorted family to 2nd floor waiting room and provided emotional support. Acted as Dentist between family and staff. Dorris Fetch, chaplain

## 2014-04-06 ENCOUNTER — Encounter (HOSPITAL_COMMUNITY): Payer: Self-pay | Admitting: *Deleted

## 2014-04-06 ENCOUNTER — Inpatient Hospital Stay (HOSPITAL_COMMUNITY): Payer: Medicare Other

## 2014-04-06 LAB — COMPREHENSIVE METABOLIC PANEL
ALT: 18 U/L (ref 0–53)
AST: 20 U/L (ref 0–37)
Albumin: 1.3 g/dL — ABNORMAL LOW (ref 3.5–5.2)
Alkaline Phosphatase: 74 U/L (ref 39–117)
Anion gap: 13 (ref 5–15)
BUN: 24 mg/dL — AB (ref 6–23)
CO2: 17 mEq/L — ABNORMAL LOW (ref 19–32)
CREATININE: 0.77 mg/dL (ref 0.50–1.35)
Calcium: 8 mg/dL — ABNORMAL LOW (ref 8.4–10.5)
Chloride: 116 mEq/L — ABNORMAL HIGH (ref 96–112)
GFR calc Af Amer: >90 mL/min (ref >90)
GFR, EST NON AFRICAN AMERICAN: 85 mL/min — AB (ref >90)
Glucose, Bld: 232 mg/dL — ABNORMAL HIGH (ref 70–99)
Potassium: 3.6 mEq/L — ABNORMAL LOW (ref 3.7–5.3)
Sodium: 146 mEq/L (ref 137–147)
Total Bilirubin: 0.3 mg/dL (ref 0.3–1.2)
Total Protein: 5.2 g/dL — ABNORMAL LOW (ref 6.0–8.3)

## 2014-04-06 LAB — GLUCOSE, CAPILLARY
GLUCOSE-CAPILLARY: 147 mg/dL — AB (ref 70–99)
Glucose-Capillary: 203 mg/dL — ABNORMAL HIGH (ref 70–99)
Glucose-Capillary: 180 mg/dL — ABNORMAL HIGH (ref 70–99)
Glucose-Capillary: 147 mg/dL — ABNORMAL HIGH (ref 70–99)
Glucose-Capillary: 127 mg/dL — ABNORMAL HIGH (ref 70–99)

## 2014-04-06 LAB — BLOOD GAS, ARTERIAL
ACID-BASE DEFICIT: 3.8 mmol/L — AB (ref 0.0–2.0)
Bicarbonate: 19.7 mEq/L — ABNORMAL LOW (ref 20.0–24.0)
DRAWN BY: 23604
FIO2: 0.3 %
LHR: 16 {breaths}/min
MECHVT: 580 mL
O2 Saturation: 95.5 %
PCO2 ART: 29.4 mmHg — AB (ref 35.0–45.0)
PEEP: 5 cmH2O
PO2 ART: 75.7 mmHg — AB (ref 80.0–100.0)
Patient temperature: 98.6
TCO2: 20.6 mmol/L (ref 0–100)
pH, Arterial: 7.44 (ref 7.350–7.450)

## 2014-04-06 LAB — URINE CULTURE
CULTURE: NO GROWTH
Colony Count: NO GROWTH

## 2014-04-06 LAB — CBC
HCT: 35.8 % — ABNORMAL LOW (ref 39.0–52.0)
Hemoglobin: 11.5 g/dL — ABNORMAL LOW (ref 13.0–17.0)
MCH: 33 pg (ref 26.0–34.0)
MCHC: 32.1 g/dL (ref 30.0–36.0)
MCV: 102.6 fL — AB (ref 78.0–100.0)
Platelets: 173 10*3/uL (ref 150–400)
RBC: 3.49 MIL/uL — ABNORMAL LOW (ref 4.22–5.81)
RDW: 14.6 % (ref 11.5–15.5)
WBC: 10.8 10*3/uL — AB (ref 4.0–10.5)

## 2014-04-06 MED ORDER — FAMOTIDINE IN NACL 20-0.9 MG/50ML-% IV SOLN
20.0000 mg | Freq: Two times a day (BID) | INTRAVENOUS | Status: DC
  Administered 2014-04-06: 20 mg via INTRAVENOUS
  Filled 2014-04-06 (×3): qty 50

## 2014-04-06 MED ORDER — DEXTROSE 5 % IV SOLN
INTRAVENOUS | Status: DC
  Administered 2014-04-06: 12:00:00 via INTRAVENOUS

## 2014-04-06 MED ORDER — LEVETIRACETAM IN NACL 1000 MG/100ML IV SOLN
1000.0000 mg | Freq: Two times a day (BID) | INTRAVENOUS | Status: DC
  Administered 2014-04-06 – 2014-04-08 (×4): 1000 mg via INTRAVENOUS
  Filled 2014-04-06 (×5): qty 100

## 2014-04-06 MED ORDER — POTASSIUM CHLORIDE 20 MEQ/15ML (10%) PO LIQD
20.0000 meq | ORAL | Status: AC
  Administered 2014-04-06 (×2): 20 meq
  Filled 2014-04-06 (×2): qty 15

## 2014-04-06 MED ORDER — PHENYTOIN SODIUM 50 MG/ML IJ SOLN
100.0000 mg | Freq: Two times a day (BID) | INTRAMUSCULAR | Status: DC
  Administered 2014-04-06 – 2014-04-08 (×4): 100 mg via INTRAVENOUS
  Filled 2014-04-06 (×5): qty 2

## 2014-04-06 MED ORDER — VANCOMYCIN HCL 10 G IV SOLR
1500.0000 mg | Freq: Two times a day (BID) | INTRAVENOUS | Status: DC
  Administered 2014-04-06 – 2014-04-07 (×2): 1500 mg via INTRAVENOUS
  Filled 2014-04-06 (×3): qty 1500

## 2014-04-06 NOTE — Procedures (Signed)
**Note De-Identified Envy Meno Obfuscation** Extubation Procedure Note  Patient Details:   Name: [name] DOB: 1933/01/27 MRN: [id number]   Airway Documentation:     Evaluation  O2 sats: stable throughout Complications: No apparent complications Patient did tolerate procedure well. Bilateral Breath Sounds: Diminished Suctioning: Oral;Airway Yes +leak, no stridor noted Marquez Ceesay, Penni Bombard 04/06/2014, 11:50 AM

## 2014-04-06 NOTE — Progress Notes (Signed)
PULMONARY / CRITICAL CARE MEDICINE HISTORY AND PHYSICAL EXAMINATION   Name: [name] MRN: [id number] DOB: 02-Sep-1932    ADMISSION DATE:  04/04/2014  PRIMARY SERVICE: PCCM  CHIEF COMPLAINT:  AMS  BRIEF PATIENT DESCRIPTION: 80 y/o man with heart failure and multiple strokes presenting with altered mental status, sepsis of pulmonary origin, HHS, and inability to protect his airway.  SIGNIFICANT EVENTS / STUDIES:  Intubated in the ED for low GCS  LINES / TUBES: ETT - placed in ED 8/28 PIV x2 (18 ga) OGT - placed in ED 8/28  CULTURES: Blood cx from ED x2 - 8/28>>> Urine cx from ED - 8/28>>>  ANTIBIOTICS: Cefepime - 8/28->>> Vanc - 8/28->>>  SUBJECTIVE:  More awake  VITAL SIGNS: Temp:  [97.4 F (36.3 C)-98.4 F (36.9 C)] 97.9 F (36.6 C) (08/30 0718) Pulse Rate:  [65-87] 65 (08/30 0900) Resp:  [12-28] 12 (08/30 0900) BP: (83-108)/(31-50) 96/39 mmHg (08/30 0900) SpO2:  [100 %] 100 % (08/30 0900) FiO2 (%):  [30 %] 30 % (08/30 0800) Weight:  [95.4 kg (210 lb 5.1 oz)] 95.4 kg (210 lb 5.1 oz) (08/30 0406) HEMODYNAMICS:   VENTILATOR SETTINGS: Vent Mode:  [-] PRVC FiO2 (%):  [30 %] 30 % Set Rate:  [16 bmp] 16 bmp Vt Set:  [580 mL] 580 mL PEEP:  [5 cmH20] 5 cmH20 Pressure Support:  [8 cmH20] 8 cmH20 Plateau Pressure:  [15 cmH20] 15 cmH20 INTAKE / OUTPUT: Intake/Output     08/29 0701 - 08/30 0700 08/30 0701 - 08/31 0700   I.V. (mL/kg) 1850 (19.4) 150 (1.6)   NG/GT 1230 250   IV Piggyback 437.5 25   Total Intake(mL/kg) 3517.5 (36.9) 425 (4.5)   Urine (mL/kg/hr) 970 (0.4) 130 (0.4)   Emesis/NG output     Total Output 970 130   Net +2547.5 +295          PHYSICAL EXAMINATION: General:  Elderly, chorically ill-appearing man. Intubated. More awake Neuro: Opens eyes to voice, no spontaneous movement of R side. Non-purposeful movement of LUE. HEENT:  Temporal wasting noted. Dry mucous membranes. Neck: No JVD Cardiovascular: Cool extremities. RRR. Lungs:scattered  rhonchi  Abdomen:  Non-distended. Musculoskeletal:  Bilateral BKAs noted. Skin: cool and clammy. No rashes.  LABS:  CBC  Recent Labs Lab 04/04/14 2147 04/05/14 0610 04/06/14 0243  WBC 11.7* 10.9* 10.8*  HGB 12.3* 11.2* 11.5*  HCT 39.6 35.2* 35.8*  PLT 203 192 173   Coag's  Recent Labs Lab 04/04/14 1706 04/04/14 2147  APTT 30  --   INR 1.22 1.27   BMET  Recent Labs Lab 04/05/14 0155 04/05/14 0610 04/06/14 0243  NA 154* 154* 146  K 3.8 4.0 3.6*  CL 122* 123* 116*  CO2 18* 21 17*  BUN 42* 38* 24*  CREATININE 1.24 1.25 0.77  GLUCOSE 218* 155* 232*   Electrolytes  Recent Labs Lab 04/04/14 2147 04/05/14 0155 04/05/14 0610 04/06/14 0243  CALCIUM 7.8* 7.9* 7.6* 8.0*  MG 2.0  --  2.0  --   PHOS 3.9  --  2.8  --    Sepsis Markers  Recent Labs Lab 04/04/14 1735  LATICACIDVEN 3.88*   ABG  Recent Labs Lab 04/04/14 1841 04/06/14 0430  PHART 7.403 7.440  PCO2ART 36.4 29.4*  PO2ART 368.0* 75.7*   Liver Enzymes  Recent Labs Lab 04/04/14 1706 04/04/14 2147 04/06/14 0243  AST 29 30 20   ALT 30 25 18   ALKPHOS 110 90 74  BILITOT 0.6 0.5 0.3  ALBUMIN 2.1*  1.7* 1.3*   Cardiac Enzymes  Recent Labs Lab 04/04/14 1706 04/04/14 1740  TROPONINI <0.30  --   PROBNP  --  865.8*   Glucose  Recent Labs Lab 04/05/14 1129 04/05/14 1529 04/05/14 1953 04/06/14 04/06/14 0345 04/06/14 0715  GLUCAP 183* 235* 197* 216* 203* 180*    Imaging Ct Head Wo Contrast  04/04/2014   CLINICAL DATA:  80 year old male is unresponsive. Initial encounter.  EXAM: CT HEAD WITHOUT CONTRAST  TECHNIQUE: Contiguous axial images were obtained from the base of the skull through the vertex without intravenous contrast.  COMPARISON:  CTA head 01/24/2014 and earlier.  FINDINGS: Intubated with oral enteric tube in place on the scout view.  Mildly increased paranasal sinus mucosal thickening. No acute osseous abnormality identified. Chronic right frontal burr hole again noted. No  acute orbit or scalp soft tissue findings. Calcified atherosclerosis at the skull base.  Mixed density right subdural hematoma and/or chronic dural thickening has not significantly changed. Trace leftward midline shift is stable. No ventriculomegaly. Progressed encephalomalacia in the left MCA territory, particularly in the left parietal lobe. Left ICA occlusion demonstrated on the prior. No new intracranial hemorrhage identified. No definite acute cortically based infarct Stable small chronic right cerebellar infarct. Stable chronic left PCA infarct. Stable small chronic lacunar right thalamus.  IMPRESSION: 1. No definite acute intracranial abnormality. Progressed left MCA territory encephalomalacia since June at which time left ICA occlusion was demonstrated by CTA. 2. Stable chronic right subdural/dural thickening. No new intracranial hemorrhage or significant intracranial mass effect. 3. Underlying advanced chronic ischemic disease.   Electronically Signed   By: Lars Pinks M.D.   On: 04/04/2014 18:29   Dg Chest Port 1 View  04/06/2014   CLINICAL DATA:  80 year old male intubated. Pneumonia. Initial encounter.  EXAM: PORTABLE CHEST - 1 VIEW  COMPARISON:  04/04/2014 and earlier.  FINDINGS: Portable AP semi upright view at 0533 hr. Stable endotracheal tube. Stable enteric tube. Stable cardiac size and mediastinal contours. Lung volumes have not significantly changed. No pneumothorax or pulmonary edema. Progressed left retrocardiac opacity. Stable dense medial right lung base opacity. Ventilation elsewhere is stable.  IMPRESSION: 1.  Stable lines and tubes. 2. Interval worsening left lower lobe ventilation, which could reflect progression of multifocal pneumonia or atelectasis. Otherwise stable.   Electronically Signed   By: Lars Pinks M.D.   On: 04/06/2014 07:32   Dg Chest Portable 1 View  04/04/2014   CLINICAL DATA:  Increased oral secretions. Concern for aspiration. Sepsis.  EXAM: PORTABLE CHEST - 1 VIEW   COMPARISON:  Of 04/04/2014  FINDINGS: Endotracheal tube tip measures 5.7 cm above the carinal. Enteric tube tip is off the field of view but is below the left hemidiaphragm. Shallow inspiration. There is atelectasis or infiltration in both lung bases, demonstrating mild increase since previous study. Changes are nonspecific but compatible with aspiration in the appropriate clinical setting. No pneumothorax. Normal heart size and pulmonary vascularity.  IMPRESSION: Shallow inspiration with increasing atelectasis or infiltration in the lung bases since prior study. Appliances appear to be in satisfactory location.   Electronically Signed   By: Lucienne Capers M.D.   On: 04/04/2014 21:14   Dg Chest Portable 1 View  04/04/2014   CLINICAL DATA:  Unresponsive.  Intubated patient.  EXAM: PORTABLE CHEST - 1 VIEW  COMPARISON:  CT 02/13/2014.  Radiographs 01/24/2014.  FINDINGS: 1737 hr. There are low lung volumes with mild patient rotation to the right. Endotracheal tube tip is approximately 4 cm  above the carina. A nasogastric tube projects below the diaphragm, tip not visualized. There are increased bibasilar airspace opacities. No pneumothorax or significant pleural effusion is seen. The heart size and mediastinal contours are stable.  IMPRESSION: Satisfactorily positioned endotracheal and nasogastric tubes. Increased bibasilar airspace opacities may reflect atelectasis, aspiration or pneumonia.   Electronically Signed   By: Camie Patience M.D.   On: 04/04/2014 17:58   Dg Abd Portable 1v  04/04/2014   CLINICAL DATA:  Unresponsive.  EXAM: PORTABLE ABDOMEN - 1 VIEW  COMPARISON:  CT 02/13/2014  FINDINGS: Mild gaseous distention of the transverse colon. Moderate stool in the colon. No evidence of bowel obstruction. No supine evidence of free air organomegaly. No acute bony abnormality. Degenerative changes in the lumbar spine and hips.  IMPRESSION: No evidence of obstruction. Moderate stool burden and gaseous distention  of the colon.   Electronically Signed   By: Rolm Baptise M.D.   On: 04/04/2014 17:58    EKG: Low voltage inferiorly. CXR: Low volumes, RLL infiltrate.  ASSESSMENT / PLAN:  Active Problems:   HCAP (healthcare-associated pneumonia)   Pneumonia   PULMONARY A: Hypoxic Respiratory Failure Need for mechanical ventilation Healthcare associated Pneumonia versus aspiration Suspect patient aspirated due to AMS.  Secretions better.  P: SBT, anticipate extubation  Hold tubefeeds  See ID section    CARDIOVASCULAR A: Chronic Systolic Heart Failure Septic shock  P:   Fluids and abx only  No escalation Full DNR   RENAL A: AKI-->improved  Hypernatremia  NAG acidosis d/t hyperchloremia  P:   Replace free water.  F/u chem in am   GASTROINTESTINAL A: Dysphagia  prob aspiration  P:   Hold TF pending extubation   HEMATOLOGIC A: Leukocytosis P:   Due to sepsis  INFECTIOUS A: Severe Sepsis due to HCAP/aspiration  P:   F/u culture data (see above) vanc started 8/28>>> Zosyn started 8/28>>>  ENDOCRINE A: Hyperglycemic Hyperosmolar State Now off gtt.  P:   ssi protocol   NEUROLOGIC A: Metabolic Encephalopathy, possible stroke CT head negative. Seizure disorder P:    Treat underlying cause. Given ASA / Plavix. Not a candidate for revascularization therapy. Continue home keppra and dilantin   TODAY'S SUMMARY: 80 y/o man with multiple co morbidities due to vascular disease presenting with sepsis, AKI, and HHS. Now full DNR w/ no escalation. Ready to wean. Possibly extubate. Will need to re-confirm no re-intubation status   I have personally obtained a history, examined the patient, evaluated laboratory and imaging results, formulated the assessment and plan and placed orders.  CRITICAL CARE: The patient is critically ill with multiple organ systems failure and requires high complexity decision making for assessment and support, frequent evaluation and titration  of therapies, application of advanced monitoring technologies and extensive interpretation of multiple databases. Critical Care Time devoted to patient care services described in this note is 35 minutes.   Rush Farmer, M.D. [facility] Pulmonary/Critical Care Medicine. Pager: [phone]. After hours pager: [phone].  04/06/2014, 10:12 AM

## 2014-04-06 NOTE — Progress Notes (Signed)
[facility] ADULT ICU REPLACEMENT PROTOCOL FOR AM LAB REPLACEMENT ONLY  The patient does apply for the [facility] Adult ICU Electrolyte Replacment Protocol based on the criteria listed below:   1. Is GFR >/= 40 ml/min? Yes.    Patient's GFR today is >90 2. Is urine output >/= 0.5 ml/kg/hr for the last 6 hours? Yes.   Patient's UOP is 0.8 ml/kg/hr 3. Is BUN < 60 mg/dL? Yes.    Patient's BUN today is 24 4. Abnormal electrolyte(s): K 3.6 5. Ordered repletion with: per protocol 6. If a panic level lab has been reported, has the CCM MD in charge been notified? No..   Physician:    Ronda Fairly A 04/06/2014 4:22 AM

## 2014-04-06 NOTE — Progress Notes (Addendum)
ANTIBIOTIC CONSULT NOTE - FOLLOW UP  Pharmacy Consult:  Vancomycin / Zosyn Indication:   Sepsis from possible aspiration PNA  Allergies  Allergen Reactions  . Ativan [Lorazepam] Other (See Comments)    Very lethargic  . Benadryl [Diphenhydramine Hcl] Other (See Comments)    Very lethargic    Patient Measurements: Height: 6' (182.9 cm) Weight: 210 lb 5.1 oz (95.4 kg) IBW/kg (Calculated) : 77.6  Vital Signs: Temp: 97.9 F (36.6 C) (08/30 0718) Temp src: Oral (08/30 0718) BP: 96/39 mmHg (08/30 0900) Pulse Rate: 74 (08/30 1025) Intake/Output from previous day: 08/29 0701 - 08/30 0700 In: 3517.5 [I.V.:1850; NG/GT:1230; IV Piggyback:437.5] Out: 970 [Urine:970] Intake/Output from this shift: Total I/O In: 425 [I.V.:150; NG/GT:250; IV Piggyback:25] Out: 130 [Urine:130]  Labs:  Recent Labs  04/04/14 2147 04/05/14 0155 04/05/14 0610 04/06/14 0243  WBC 11.7*  --  10.9* 10.8*  HGB 12.3*  --  11.2* 11.5*  PLT 203  --  192 173  CREATININE 1.52* 1.24 1.25 0.77   Estimated Creatinine Clearance: 91.2 ml/min (by C-G formula based on Cr of 0.77). No results found for this basename: VANCOTROUGH, VANCOPEAK, VANCORANDOM, [place], [place], [place], [place], [place], [place], AMIKACINPEAK, AMIKACINTROU, AMIKACIN,  in the last 72 hours   Microbiology: Recent Results (from the past 720 hour(s))  CULTURE, BLOOD (ROUTINE X 2)     Status: None   Collection Time    04/04/14  5:19 PM      Result Value Ref Range Status   Specimen Description BLOOD ARM RIGHT   Final   Special Requests BOTTLES DRAWN AEROBIC ONLY 3CC   Final   Culture  Setup Time     Final   Value: 04/04/2014 22:45     Performed at [facility]   Culture     Final   Value:        BLOOD CULTURE RECEIVED NO GROWTH TO DATE CULTURE WILL BE HELD FOR 5 DAYS BEFORE ISSUING A FINAL NEGATIVE REPORT     Performed at [facility]   Report Status PENDING   Incomplete  CULTURE, BLOOD (ROUTINE X 2)      Status: None   Collection Time    04/04/14  5:25 PM      Result Value Ref Range Status   Specimen Description BLOOD ARM LEFT   Final   Special Requests BOTTLES DRAWN AEROBIC ONLY [facility]   Final   Culture  Setup Time     Final   Value: 04/04/2014 22:45     Performed at [facility]   Culture     Final   Value:        BLOOD CULTURE RECEIVED NO GROWTH TO DATE CULTURE WILL BE HELD FOR 5 DAYS BEFORE ISSUING A FINAL NEGATIVE REPORT     Performed at [facility]   Report Status PENDING   Incomplete  URINE CULTURE     Status: None   Collection Time    04/04/14  5:52 PM      Result Value Ref Range Status   Specimen Description URINE, RANDOM   Final   Special Requests NONE   Final   Culture  Setup Time     Final   Value: 04/05/2014 01:49     Performed at [place]     Final   Value: NO GROWTH     Performed at [facility]   Culture     Final   Value: NO  GROWTH     Performed at [facility]   Report Status 04/06/2014 FINAL   Final  MRSA PCR SCREENING     Status: Abnormal   Collection Time    04/04/14  9:17 PM      Result Value Ref Range Status   MRSA by PCR POSITIVE (*) NEGATIVE Final   Comment:            The GeneXpert MRSA Assay (FDA     approved for NASAL specimens     only), is one component of a     comprehensive MRSA colonization     surveillance program. It is not     intended to diagnose MRSA     infection nor to guide or     monitor treatment for     MRSA infections.     RESULT CALLED TO, READ BACK BY AND VERIFIED WITH:     Barbette Hair [phone] [place]      Assessment: 23 YOM admitted s/p found unresponsive for an unknown period of time.  Patient was started on broad spectrum antibiotics for sepsis due to HCAP/aspiration.  Patient's renal function improving.  Vanc 8/28 >> Zosyn 8/28 >>  8/28 MRSA PCR - positive 8/28 UCx - negative 8/28 BCx x2 - NGTD   Goal of Therapy:  Vancomycin trough level 15-20  mcg/ml   Plan:  - Change vanc to 1500mg  IV Q12H - Continue Zosyn 3.375gm IV Q8H, 4 hr infurion - Monitor renal fxn, clinical progress, vanc trough at Css - F/U resume home meds post extubation, recheck DPH level - Watch CBGs    Kobie Whidby D. Mina Marble, PharmD, BCPS Pager:  [phone] 04/06/2014, 11:32 AM   ==============================   Addendum: - change DPH to IV post extubation, not ready for Speech eval - no new seizure - 8/28 DPH level < 2.5, home dose resumed    Goal of Therapy: DPH level:  10-20 mcg/mL   Plan: - Change Dilantin to 100mg  IV Q12H - Recheck DPH level and albumin at Css - Will also change Keppra and Pepcid to IV    Tesla Bochicchio D. Mina Marble, PharmD, BCPS Pager:  [phone] 04/06/2014, 12:37 PM

## 2014-04-07 ENCOUNTER — Inpatient Hospital Stay (HOSPITAL_COMMUNITY): Payer: Medicare Other

## 2014-04-07 DIAGNOSIS — E119 Type 2 diabetes mellitus without complications: Secondary | ICD-10-CM

## 2014-04-07 LAB — GLUCOSE, CAPILLARY
GLUCOSE-CAPILLARY: 118 mg/dL — AB (ref 70–99)
GLUCOSE-CAPILLARY: 119 mg/dL — AB (ref 70–99)
GLUCOSE-CAPILLARY: 132 mg/dL — AB (ref 70–99)
GLUCOSE-CAPILLARY: 141 mg/dL — AB (ref 70–99)
GLUCOSE-CAPILLARY: 147 mg/dL — AB (ref 70–99)
Glucose-Capillary: 137 mg/dL — ABNORMAL HIGH (ref 70–99)

## 2014-04-07 LAB — COMPREHENSIVE METABOLIC PANEL
ALK PHOS: 80 U/L (ref 39–117)
ALT: 16 U/L (ref 0–53)
AST: 21 U/L (ref 0–37)
Albumin: 1.5 g/dL — ABNORMAL LOW (ref 3.5–5.2)
Anion gap: 12 (ref 5–15)
BUN: 12 mg/dL (ref 6–23)
CALCIUM: 7.9 mg/dL — AB (ref 8.4–10.5)
CO2: 18 mEq/L — ABNORMAL LOW (ref 19–32)
Chloride: 115 mEq/L — ABNORMAL HIGH (ref 96–112)
Creatinine, Ser: 0.66 mg/dL (ref 0.50–1.35)
Glucose, Bld: 132 mg/dL — ABNORMAL HIGH (ref 70–99)
Potassium: 3.4 mEq/L — ABNORMAL LOW (ref 3.7–5.3)
SODIUM: 145 meq/L (ref 137–147)
Total Bilirubin: 0.4 mg/dL (ref 0.3–1.2)
Total Protein: 5.4 g/dL — ABNORMAL LOW (ref 6.0–8.3)

## 2014-04-07 LAB — CBC
HCT: 34.6 % — ABNORMAL LOW (ref 39.0–52.0)
HEMOGLOBIN: 11.7 g/dL — AB (ref 13.0–17.0)
MCH: 32.9 pg (ref 26.0–34.0)
MCHC: 33.8 g/dL (ref 30.0–36.0)
MCV: 97.2 fL (ref 78.0–100.0)
PLATELETS: 219 10*3/uL (ref 150–400)
RBC: 3.56 MIL/uL — AB (ref 4.22–5.81)
RDW: 14.2 % (ref 11.5–15.5)
WBC: 11 10*3/uL — AB (ref 4.0–10.5)

## 2014-04-07 LAB — VANCOMYCIN, TROUGH: Vancomycin Tr: 18.6 ug/mL (ref 10.0–20.0)

## 2014-04-07 MED ORDER — VANCOMYCIN HCL 10 G IV SOLR
1500.0000 mg | Freq: Two times a day (BID) | INTRAVENOUS | Status: DC
  Administered 2014-04-07 – 2014-04-08 (×2): 1500 mg via INTRAVENOUS
  Filled 2014-04-07 (×3): qty 1500

## 2014-04-07 MED ORDER — POTASSIUM CHLORIDE CRYS ER 20 MEQ PO TBCR: 20.0000 meq | EXTENDED_RELEASE_TABLET | ORAL | Status: DC

## 2014-04-07 MED ORDER — VANCOMYCIN HCL IN DEXTROSE 1-5 GM/200ML-% IV SOLN
1000.0000 mg | Freq: Two times a day (BID) | INTRAVENOUS | Status: DC
  Filled 2014-04-07: qty 200

## 2014-04-07 MED ORDER — VITAL HIGH PROTEIN PO LIQD
1000.0000 mL | ORAL | Status: DC
  Administered 2014-04-07: 1000 mL
  Filled 2014-04-07 (×5): qty 1000

## 2014-04-07 MED ORDER — COLLAGENASE 250 UNIT/GM EX OINT
TOPICAL_OINTMENT | Freq: Every day | CUTANEOUS | Status: DC
  Administered 2014-04-07 – 2014-04-10 (×4): via TOPICAL
  Filled 2014-04-07 (×2): qty 30

## 2014-04-07 MED ORDER — POTASSIUM CHLORIDE 10 MEQ/100ML IV SOLN
10.0000 meq | INTRAVENOUS | Status: AC
  Filled 2014-04-07 (×2): qty 100

## 2014-04-07 MED ORDER — PRO-STAT SUGAR FREE PO LIQD
30.0000 mL | Freq: Two times a day (BID) | ORAL | Status: AC
  Administered 2014-04-07: 12:00:00
  Administered 2014-04-07: 30 mL
  Filled 2014-04-07 (×2): qty 30

## 2014-04-07 MED ORDER — POTASSIUM CHLORIDE 20 MEQ/15ML (10%) PO LIQD
40.0000 meq | Freq: Once | ORAL | Status: AC
  Administered 2014-04-07: 40 meq
  Filled 2014-04-07: qty 30

## 2014-04-07 MED ORDER — CARVEDILOL 3.125 MG PO TABS
3.1250 mg | ORAL_TABLET | Freq: Two times a day (BID) | ORAL | Status: DC
  Administered 2014-04-07 – 2014-04-10 (×4): 3.125 mg via ORAL
  Filled 2014-04-07 (×9): qty 1

## 2014-04-07 NOTE — Care Management Note (Addendum)
    Page 1 of 1   04/09/2014     2:24:17 PM CARE MANAGEMENT NOTE 04/09/2014  Patient:  [name], [name]   Account Number:  [id number]  Date Initiated:  04/04/2014  Documentation initiated by:  Luz Lex  Subjective/Objective Assessment:   From SNF with AMS. Septic.//From [facility].     Action/Plan:   Intubation, IV abx.// Access for disposition needs.   Anticipated DC Date:  04/07/2014   Anticipated DC Plan:  LONG TERM ACUTE CARE (LTAC)  In-house referral  Clinical Social Worker      DC Planning Services  CM consult      Choice offered to / List presented to:             Status of service:  Completed, signed off Medicare Important Message given?  YES (If response is "NO", the following Medicare IM given date fields will be blank) Date Medicare IM given:  04/07/2014 Medicare IM given by:  Elissa Hefty Date Additional Medicare IM given:  04/09/2014 Additional Medicare IM given by:  Kweli Grassel  Discharge Disposition:    Per UR Regulation:  Reviewed for med. necessity/level of care/duration of stay  If discussed at [place] of Stay Meetings, dates discussed:    Comments:  ContactTaheem, Fricke Spouse [phone]                Sian, Joles Son [phone]  04/09/14 Luana, RN, BSN, [place] [phone] Referral for [facility] made.  Per [facility], pt meets criteria; but are currently full.  Awaiting result from [facility] for possible d/c today.  04-07-14 5:35am Luz Lex, [place] Extubated on 8-30, on [place]  04-04-14 [place], [place] Admitted from SNF - DNR. -  SW referral placed.

## 2014-04-07 NOTE — Progress Notes (Signed)
NUTRITION FOLLOW UP  Intervention:    Utilize 78M PEPuP Protocol: initiate TF via NGT with Pivot 1.5 at 25 ml/h and Prostat 30 ml BID on day 1; on day 2, d/c Prostat and increase to goal rate of 60 ml/h (1440 ml per day) to provide 2160 kcals, 135 gm protein, 1093 ml free water daily.  New Nutrition Dx:   Increased nutrient needs related to sacral wound as evidenced by estimated calorie and protein needs.  Goal:   Intake to meet >90% of estimated nutrition needs. Unmet.  Monitor:   TF tolerance/adequacy, weight trend, labs, wound healing.  Assessment:   Pt with hx of HTN, DM, CAD, CHF, and reportedly a CVA presents for AMS by EMS from nursing home after being found unresponsive. Intubated as he could not protect his airway.   Discussed patient in ICU rounds today. Patient was extubated on 8/30. Patient with an unstageable sacral pressure ulcer. Remains NPO with plans to place a feeding tube for initiation of enteral nutrition.  Height: Ht Readings from Last 1 Encounters:  04/04/14 6' (1.829 m)    Weight Status:   Wt Readings from Last 1 Encounters:  04/07/14 212 lb 11.9 oz (96.5 kg)    Re-estimated needs:  Kcal: 2150-2350 Protein: 120-140 gm Fluid: 2.4 L  Skin: unstageable coccyx pressure ulcer, stage 2 scrotum pressure ulcer, bilateral BKA  Diet Order: NPO   Intake/Output Summary (Last 24 hours) at 04/07/14 1207 Last data filed at 04/07/14 0800  Gross per 24 hour  Intake   2660 ml  Output   1085 ml  Net   1575 ml    Last BM: None documented since admission  Labs:   Recent Labs Lab 04/04/14 2147  04/05/14 0610 04/06/14 0243 04/07/14 0244  NA 156*  < > 154* 146 145  K 3.9  < > 4.0 3.6* 3.4*  CL 120*  < > 123* 116* 115*  CO2 21  < > 21 17* 18*  BUN 45*  < > 38* 24* 12  CREATININE 1.52*  < > 1.25 0.77 0.66  CALCIUM 7.8*  < > 7.6* 8.0* 7.9*  MG 2.0  --  2.0  --   --   PHOS 3.9  --  2.8  --   --   GLUCOSE 353*  < > 155* 232* 132*  < > = values in this  interval not displayed.  CBG (last 3)   Recent Labs  04/06/14 2347 04/07/14 0353 04/07/14 0801  GLUCAP 118* 132* 147*    Scheduled Meds: . antiseptic oral rinse  7 mL Mouth Rinse QID  . aspirin EC  81 mg Oral Daily  . carvedilol  3.125 mg Oral BID WC  . chlorhexidine  15 mL Mouth Rinse BID  . Chlorhexidine Gluconate Cloth  6 each Topical Q0600  . clopidogrel  75 mg Oral Q breakfast  . collagenase   Topical Daily  . heparin  5,000 Units Subcutaneous 3 times per day  . insulin aspart  0-15 Units Subcutaneous 6 times per day  . levETIRAcetam  1,000 mg Intravenous Q12H  . mupirocin ointment  1 application Nasal BID  . phenytoin (DILANTIN) IV  100 mg Intravenous BID  . piperacillin-tazobactam (ZOSYN)  IV  3.375 g Intravenous Q8H  . potassium chloride  40 mEq Per Tube Once  . vancomycin  1,000 mg Intravenous Q12H    Continuous Infusions: . dextrose 45 mL/hr at 04/06/14 1145    Molli Barrows, RD, LDN, [place] Pager  [phone] After Hours Pager [phone]

## 2014-04-07 NOTE — Progress Notes (Signed)
PULMONARY / CRITICAL CARE MEDICINE HISTORY AND PHYSICAL EXAMINATION  Name: [name] MRN: [id number] DOB: 1932/09/28    ADMISSION DATE:  04/04/2014  PRIMARY SERVICE: PCCM  CHIEF COMPLAINT:  AMS  BRIEF PATIENT DESCRIPTION: 80 yo with heart failure and multiple strokes presenting with altered mental status, sepsis, HHS, and inability to protect his airway.  SIGNIFICANT EVENTS / STUDIES:   INTERVAL HISTORY:   No acute issues overnight  VITAL SIGNS: Temp:  [97.6 F (36.4 C)-98.5 F (36.9 C)] 98.4 F (36.9 C) (08/31 0851) Pulse Rate:  [75-95] 75 (08/31 0700) Resp:  [16-31] 25 (08/31 0700) BP: (103-131)/(41-78) 114/51 mmHg (08/31 0700) SpO2:  [99 %-100 %] 100 % (08/31 0700) Weight:  [96.5 kg (212 lb 11.9 oz)] 96.5 kg (212 lb 11.9 oz) (08/31 0454)  HEMODYNAMICS:   VENTILATOR SETTINGS:   INTAKE / OUTPUT: Intake/Output     08/30 0701 - 08/31 0700 08/31 0701 - 09/01 0700   I.V. (mL/kg) 1187.5 (12.3) 45 (0.5)   Other 525    NG/GT 250    IV Piggyback 1312.5 12.5   Total Intake(mL/kg) 3275 (33.9) 57.5 (0.6)   Urine (mL/kg/hr) 1040 (0.4) 175 (0.5)   Total Output 1040 175   Net +2235 -117.5        Stool Occurrence 1 x      PHYSICAL EXAMINATION: General:  No distress Neuro: Sleepy but responds to stimulation, R hemiparesis HEENT:  Temporal wasting, moist membranes Neck: No JVD Cardiovascular: Regular, no murmurs Lungs: Bilateral air entry, few rhonchi Abdomen: Soft, bowel sounds present Musculoskeletal:  Bilateral BKA Skin: No rash  LABS:  CBC  Recent Labs Lab 04/05/14 0610 04/06/14 0243 04/07/14 0244  WBC 10.9* 10.8* 11.0*  HGB 11.2* 11.5* 11.7*  HCT 35.2* 35.8* 34.6*  PLT 192 173 219   Coag's  Recent Labs Lab 04/04/14 1706 04/04/14 2147  APTT 30  --   INR 1.22 1.27   BMET  Recent Labs Lab 04/05/14 0610 04/06/14 0243 04/07/14 0244  NA 154* 146 145  K 4.0 3.6* 3.4*  CL 123* 116* 115*  CO2 21 17* 18*  BUN 38* 24* 12  CREATININE 1.25 0.77  0.66  GLUCOSE 155* 232* 132*   Electrolytes  Recent Labs Lab 04/04/14 2147  04/05/14 0610 04/06/14 0243 04/07/14 0244  CALCIUM 7.8*  < > 7.6* 8.0* 7.9*  MG 2.0  --  2.0  --   --   PHOS 3.9  --  2.8  --   --   < > = values in this interval not displayed. Sepsis Markers  Recent Labs Lab 04/04/14 1735  LATICACIDVEN 3.88*   ABG  Recent Labs Lab 04/04/14 1841 04/06/14 0430  PHART 7.403 7.440  PCO2ART 36.4 29.4*  PO2ART 368.0* 75.7*   Liver Enzymes  Recent Labs Lab 04/04/14 2147 04/06/14 0243 04/07/14 0244  AST 30 20 21   ALT 25 18 16   ALKPHOS 90 74 80  BILITOT 0.5 0.3 0.4  ALBUMIN 1.7* 1.3* 1.5*   Cardiac Enzymes  Recent Labs Lab 04/04/14 1706 04/04/14 1740  TROPONINI <0.30  --   PROBNP  --  865.8*   Glucose  Recent Labs Lab 04/06/14 1135 04/06/14 1539 04/06/14 1932 04/06/14 2347 04/07/14 0353 04/07/14 0801  GLUCAP 147* 147* 127* 118* 132* 147*   IMAGING:   Dg Chest Port 1 View  04/07/2014   CLINICAL DATA:  Pneumonia  EXAM: PORTABLE CHEST - 1 VIEW  COMPARISON:  04/06/2014  FINDINGS: Normal cardiac silhouette. Persistent bibasilar airspace disease  greater on the right suggests pneumonia. Some improvement in aeration to the lung bases. No pneumothorax.  IMPRESSION: 1. Bibasilar airspace disease suggesting pneumonia. 2. Increase aeration to the lung bases.   Electronically Signed   By: Suzy Bouchard M.D.   On: 04/07/2014 07:14   Dg Chest Port 1 View  04/06/2014   CLINICAL DATA:  80 year old male intubated. Pneumonia. Initial encounter.  EXAM: PORTABLE CHEST - 1 VIEW  COMPARISON:  04/04/2014 and earlier.  FINDINGS: Portable AP semi upright view at 0533 hr. Stable endotracheal tube. Stable enteric tube. Stable cardiac size and mediastinal contours. Lung volumes have not significantly changed. No pneumothorax or pulmonary edema. Progressed left retrocardiac opacity. Stable dense medial right lung base opacity. Ventilation elsewhere is stable.   IMPRESSION: 1.  Stable lines and tubes. 2. Interval worsening left lower lobe ventilation, which could reflect progression of multifocal pneumonia or atelectasis. Otherwise stable.   Electronically Signed   By: Lars Pinks M.D.   On: 04/06/2014 07:32   ASSESSMENT / PLAN:  PULMONARY A: No active issues P: Goal SpO2>92 Supplemental oxygen PRN IS / flutter when able  CARDIOVASCULAR A: Chronic systolic congestive heart failure P:   Goal MAP > 65 ASA, Plavix Restart Coreg  RENAL A: Hypokalemia Hypernatremia P:   Trend BMP Free water  K 10 x 2 K 40 x 1  GASTROINTESTINAL A: Dysphagia  GI Px is not indicated P:   Place [employer] TF SLP eval D/c Pepcid  HEMATOLOGIC A: VTE Px P:   Heparin [place]  INFECTIOUS A: Aspiration pneumonia P:   Vancomycin / Zosyn 8/28 >>>  ENDOCRINE A: Hyperglycemia P:   SSI  NEUROLOGIC A: Metabolic encephalopathy Seizure disorder P:   Keppra / Dilantin D/c Haldol  Transfer to SDU 8/31, TRH to assume care 9/1.  PCCM will sign off.  I have personally obtained history, examined patient, evaluated and interpreted laboratory and imaging results, reviewed medical records, formulated assessment / plan and placed orders.  Doree Fudge, MD Pulmonary and [place] Pager: [phone]  04/07/2014, 10:29 AM

## 2014-04-07 NOTE — Consult Note (Signed)
WOC wound consult note Reason for Consult: evaluation of sacral wound.  Pt from [facility], not able to answer questions or obtain any history, no family at bedside at the time of my assessment. Wound type: Unstageable Pressure ulcer: sacrum; sDTI (suspected deep tissue injury): left buttock  Pressure Ulcer POA: Yes Measurement: 3cm x 3cm x 0.2cm sacrum 1.0cm x 1.0cm x 0 left buttock  Wound bed: Sacrum 100% yellow/brown slough Left buttock: dark maroon, purple skin intact  Drainage (amount, consistency, odor) unable to assess today, wound covered in stool  Periwound: intact Dressing procedure/placement/frequency: Will order enzymatic debridement ointment to clear the wound of necrotic tissue. Turn from side to side every 2 hours.  Will need to make sure SNF staff understand need to assess and reposition to prevent this area from worsening. Will need air mattress in the facility and in [facility] if patient up in [facility] at all.  Discussed POC with bedside nurse.  Re consult if needed, will not follow at this time. Thanks  Marcille Barman [place], [place] [phone])

## 2014-04-07 NOTE — Progress Notes (Signed)
Unable to take PO medications. Changes route to IV.

## 2014-04-07 NOTE — Clinical Documentation Improvement (Signed)
ED note documents hx of CVA with residual deficits.  H&P mentions per son patient has dense right-sided hemiparesis and limited functional use of his LUE.    Please document in progress note and discharge summary any clinical conditions currently present related to above and the underlying cause.  Possible Clinical Conditions: -Hemiparesis related to previous CVA -Hemiparesis related to other condition -Other condition -Unable to determine  Thank you, Mateo Flow, RN [phone] Clinical Documentation Specialist

## 2014-04-07 NOTE — Progress Notes (Signed)
ANTIBIOTIC CONSULT NOTE - FOLLOW UP  Pharmacy Consult for Vancomycin, Zosyn Indication: rule out pneumonia  Allergies  Allergen Reactions  . Ativan [Lorazepam] Other (See Comments)    Very lethargic  . Benadryl [Diphenhydramine Hcl] Other (See Comments)    Very lethargic    Patient Measurements: Height: 6' (182.9 cm) Weight: 212 lb 11.9 oz (96.5 kg) IBW/kg (Calculated) : 77.6 Vital Signs: Temp: 98.5 F (36.9 C) (08/31 1227) Temp src: Oral (08/31 1100) BP: 124/63 mmHg (08/31 1400) Pulse Rate: 58 (08/31 1400) Intake/Output from previous day: 08/30 0701 - 08/31 0700 In: 8280 [I.V.:1187.5; NG/GT:250; IV Piggyback:1312.5] Out: 0349 [Urine:1040] Intake/Output from this shift: Total I/O In: 382.5 [I.V.:270; IV Piggyback:112.5] Out: 275 [Urine:275]  Labs:  Recent Labs  04/05/14 0610 04/06/14 0243 04/07/14 0244  WBC 10.9* 10.8* 11.0*  HGB 11.2* 11.5* 11.7*  PLT 192 173 219  CREATININE 1.25 0.77 0.66   Estimated Creatinine Clearance: 91.7 ml/min (by C-G formula based on Cr of 0.66).  Recent Labs  04/07/14 1317  [place] 18.6     Microbiology: 8/28 MRSA PCR - positive 8/28 UCx - negative 8/28 BCx x2 - NGTD  Assessment: 25 YOM on day # 4 of vancomycin and Zosyn for r/o pneumonia. WBC is trending down. Patient is afebrile. SCr has improved (note patient with bilateral BKAs). Urine output low at 0.4 cc/kg/hr.   Vancomycin trough = 18.6 on vancomycin 1.5g IV q12h.   Goal of Therapy:  Vancomycin trough level 15-20 mcg/ml  Plan:  1. Continue vancomycin 1.5g IV q12h.  2. Continue Zosyn 3.375g IV q8h - 4hr infusion.  3. Follow-up renal function and adjust dosing as needed.   Sloan Leiter, PharmD, BCPS Clinical Pharmacist [phone] 04/07/2014,2:17 PM

## 2014-04-07 NOTE — Progress Notes (Signed)
UR Completed.  Jerilee Space Jane [phone] 04/07/2014  

## 2014-04-07 NOTE — Progress Notes (Signed)
[facility] ADULT ICU REPLACEMENT PROTOCOL FOR AM LAB REPLACEMENT ONLY  The patient does apply for the [facility] Adult ICU Electrolyte Replacment Protocol based on the criteria listed below:   1. Is GFR >/= 40 ml/min? Yes.    Patient's GFR today is >90 2. Is urine output >/= 0.5 ml/kg/hr for the last 6 hours? Yes.   Patient's UOP is 0.8 ml/kg/hr 3. Is BUN < 60 mg/dL? Yes.    Patient's BUN today is 12 4. Abnormal electrolyte(s):K 3.4 5. Ordered repletion with: per protocol 6. If a panic level lab has been reported, has the CCM MD in charge been notified? No..   Physician:    Ronda Fairly A 04/07/2014 6:20 AM

## 2014-04-08 ENCOUNTER — Inpatient Hospital Stay (HOSPITAL_COMMUNITY): Payer: Medicare Other

## 2014-04-08 DIAGNOSIS — G9341 Metabolic encephalopathy: Secondary | ICD-10-CM

## 2014-04-08 DIAGNOSIS — S88119A Complete traumatic amputation at level between knee and ankle, unspecified lower leg, initial encounter: Secondary | ICD-10-CM

## 2014-04-08 DIAGNOSIS — N179 Acute kidney failure, unspecified: Secondary | ICD-10-CM

## 2014-04-08 DIAGNOSIS — E1101 Type 2 diabetes mellitus with hyperosmolarity with coma: Secondary | ICD-10-CM

## 2014-04-08 DIAGNOSIS — A419 Sepsis, unspecified organism: Secondary | ICD-10-CM

## 2014-04-08 DIAGNOSIS — G40909 Epilepsy, unspecified, not intractable, without status epilepticus: Secondary | ICD-10-CM

## 2014-04-08 LAB — BASIC METABOLIC PANEL
Anion gap: 12 (ref 5–15)
BUN: 11 mg/dL (ref 6–23)
CHLORIDE: 112 meq/L (ref 96–112)
CO2: 19 meq/L (ref 19–32)
Calcium: 7.9 mg/dL — ABNORMAL LOW (ref 8.4–10.5)
Creatinine, Ser: 0.6 mg/dL (ref 0.50–1.35)
GFR calc Af Amer: >90 mL/min (ref >90)
GLUCOSE: 127 mg/dL — AB (ref 70–99)
POTASSIUM: 3.5 meq/L — AB (ref 3.7–5.3)
Sodium: 143 mEq/L (ref 137–147)

## 2014-04-08 LAB — GLUCOSE, CAPILLARY
GLUCOSE-CAPILLARY: 99 mg/dL (ref 70–99)
GLUCOSE-CAPILLARY: 138 mg/dL — AB (ref 70–99)
Glucose-Capillary: 135 mg/dL — ABNORMAL HIGH (ref 70–99)
Glucose-Capillary: 187 mg/dL — ABNORMAL HIGH (ref 70–99)
Glucose-Capillary: 222 mg/dL — ABNORMAL HIGH (ref 70–99)
Glucose-Capillary: 90 mg/dL (ref 70–99)

## 2014-04-08 MED ORDER — LEVOFLOXACIN 750 MG PO TABS
750.0000 mg | ORAL_TABLET | Freq: Every day | ORAL | Status: DC
  Administered 2014-04-08 – 2014-04-10 (×3): 750 mg
  Filled 2014-04-08 (×4): qty 1

## 2014-04-08 MED ORDER — HEPARIN SODIUM (PORCINE) 5000 UNIT/ML IJ SOLN
5000.0000 [IU] | Freq: Three times a day (TID) | INTRAMUSCULAR | Status: DC
  Administered 2014-04-08 – 2014-04-10 (×5): 5000 [IU] via SUBCUTANEOUS
  Filled 2014-04-08 (×6): qty 1

## 2014-04-08 MED ORDER — PHENYTOIN 125 MG/5ML PO SUSP
100.0000 mg | Freq: Two times a day (BID) | ORAL | Status: DC
  Administered 2014-04-10 (×2): 100 mg
  Filled 2014-04-08 (×5): qty 4

## 2014-04-08 MED ORDER — LEVETIRACETAM 100 MG/ML PO SOLN
1000.0000 mg | Freq: Two times a day (BID) | ORAL | Status: DC
  Administered 2014-04-10 (×2): 1000 mg via ORAL
  Filled 2014-04-08 (×6): qty 10

## 2014-04-08 MED ORDER — IPRATROPIUM-ALBUTEROL 0.5-2.5 (3) MG/3ML IN SOLN
3.0000 mL | RESPIRATORY_TRACT | Status: DC
  Administered 2014-04-09 (×4): 3 mL via RESPIRATORY_TRACT
  Filled 2014-04-08 (×4): qty 3

## 2014-04-08 MED ORDER — ASPIRIN 81 MG PO CHEW
81.0000 mg | CHEWABLE_TABLET | Freq: Every day | ORAL | Status: DC
  Administered 2014-04-08 – 2014-04-10 (×3): 81 mg via ORAL
  Filled 2014-04-08 (×4): qty 1

## 2014-04-08 MED ORDER — PREDNISONE 50 MG PO TABS
50.0000 mg | ORAL_TABLET | Freq: Every day | ORAL | Status: DC
  Filled 2014-04-08 (×2): qty 1

## 2014-04-08 MED ORDER — PREDNISONE 50 MG PO TABS
50.0000 mg | ORAL_TABLET | Freq: Every day | ORAL | Status: DC
  Filled 2014-04-08: qty 1

## 2014-04-08 MED ORDER — LEVETIRACETAM 500 MG PO TABS
1000.0000 mg | ORAL_TABLET | Freq: Two times a day (BID) | ORAL | Status: DC
  Filled 2014-04-08: qty 2

## 2014-04-08 MED ORDER — PIVOT 1.5 CAL PO LIQD
1000.0000 mL | ORAL | Status: DC
  Administered 2014-04-08: 1000 mL
  Filled 2014-04-08 (×7): qty 1000

## 2014-04-08 NOTE — Progress Notes (Signed)
IV team paged for obtaining second IV site prior to expiration of current IV.

## 2014-04-08 NOTE — Progress Notes (Signed)
ANTIBIOTIC & DILANTIN CONSULT NOTE - INITIAL  Pharmacy Consult for Levaquin; Follow-up Dilantin Indication: rule out pneumonia and hx seizures  Allergies  Allergen Reactions  . Ativan [Lorazepam] Other (See Comments)    Very lethargic  . Benadryl [Diphenhydramine Hcl] Other (See Comments)    Very lethargic    Patient Measurements: Height: 6' (182.9 cm) Weight: 219 lb 5.7 oz (99.5 kg) IBW/kg (Calculated) : 77.6 Vital Signs: Temp: 98 F (36.7 C) (09/01 1225) Temp src: Oral (09/01 1225) BP: 97/56 mmHg (09/01 1200) Pulse Rate: 68 (09/01 1200) Intake/Output from previous day: 08/31 0701 - 09/01 0700 In: 2582.5 [I.V.:810; NG/GT:447.5; IV Piggyback:1325] Out: 1130 [Urine:1130] Intake/Output from this shift: Total I/O In: 315 [I.V.:135; NG/GT:180] Out: 300 [Urine:300]  Labs:  Recent Labs  04/06/14 0243 04/07/14 0244 04/08/14 0226  WBC 10.8* 11.0*  --   HGB 11.5* 11.7*  --   PLT 173 219  --   CREATININE 0.77 0.66 0.60   Estimated Creatinine Clearance: 93 ml/min (by C-G formula based on Cr of 0.6).  Recent Labs  04/07/14 1317  [place] 18.6     Microbiology: Recent Results (from the past 720 hour(s))  CULTURE, BLOOD (ROUTINE X 2)     Status: None   Collection Time    04/04/14  5:19 PM      Result Value Ref Range Status   Specimen Description BLOOD ARM RIGHT   Final   Special Requests BOTTLES DRAWN AEROBIC ONLY 3CC   Final   Culture  Setup Time     Final   Value: 04/04/2014 22:45     Performed at [facility]   Culture     Final   Value:        BLOOD CULTURE RECEIVED NO GROWTH TO DATE CULTURE WILL BE HELD FOR 5 DAYS BEFORE ISSUING A FINAL NEGATIVE REPORT     Performed at [facility]   Report Status PENDING   Incomplete  CULTURE, BLOOD (ROUTINE X 2)     Status: None   Collection Time    04/04/14  5:25 PM      Result Value Ref Range Status   Specimen Description BLOOD ARM LEFT   Final   Special Requests BOTTLES DRAWN AEROBIC ONLY [facility]    Final   Culture  Setup Time     Final   Value: 04/04/2014 22:45     Performed at [facility]   Culture     Final   Value:        BLOOD CULTURE RECEIVED NO GROWTH TO DATE CULTURE WILL BE HELD FOR 5 DAYS BEFORE ISSUING A FINAL NEGATIVE REPORT     Performed at [facility]   Report Status PENDING   Incomplete  URINE CULTURE     Status: None   Collection Time    04/04/14  5:52 PM      Result Value Ref Range Status   Specimen Description URINE, RANDOM   Final   Special Requests NONE   Final   Culture  Setup Time     Final   Value: 04/05/2014 01:49     Performed at [place]     Final   Value: NO GROWTH     Performed at [facility]   Culture     Final   Value: NO GROWTH     Performed at [facility]   Report Status 04/06/2014 FINAL   Final  MRSA PCR  SCREENING     Status: Abnormal   Collection Time    04/04/14  9:17 PM      Result Value Ref Range Status   MRSA by PCR POSITIVE (*) NEGATIVE Final   Comment:            The GeneXpert MRSA Assay (FDA     approved for NASAL specimens     only), is one component of a     comprehensive MRSA colonization     surveillance program. It is not     intended to diagnose MRSA     infection nor to guide or     monitor treatment for     MRSA infections.     RESULT CALLED TO, READ BACK BY AND VERIFIED WITH:     L JAMES,RN [phone] [place]   Assessment: 23 YOM on day #5 of vancomycin and Zosyn for r/o pneumonia now to narrow to Levaquin per tube. WBC is trending down. Afebrile. CrCl ~ 90-95 mL/min.   8/28 MRSA PCR - positive 8/28 UCx - negative 8/28 BCx x2 - NGTD  Neurology: Patient on Keppra and Dilantin for hx of seizures. No current seizures. Patient lost IV access. Panda in place. Dilantin level low on admission from SNF and prior administration per tube. Resumed home dose IV >> now changing to per tube with recheck level in 5-7 days (likely Friday). Note the risk of binding per  tube administration which could have resulted in low level prior to admission. Educated RN to hold tube feeds 1 hr prior to and 2 hours post administration of Dilantin.    Goal of Therapy:  Clinical resolution of infection; Seizure control  Plan:  1. D/C vancomycin and Zosyn 2. Levaquin 750mg  po q24h 3. Change Dilantin to 100mg  per tube q12h - HOLD tube feeds 1 hour prior and 2 hours post each dose. This instruction can be found in the administration instructions for this medication.  4. Recheck dilantin level at steady state.   Sloan Leiter, PharmD, BCPS Clinical Pharmacist [phone] 04/08/2014,2:03 PM

## 2014-04-08 NOTE — Progress Notes (Signed)
Pt. Panda tube reinserted into pts. R nare by Drue Stager., [facility] [place] RN. Placement confirmed by CO2 and auscultation. Order for stat Abdominal x-ray placed. RN will continue to monitor pt. For changes in condition. Acelynn Dejonge, [name]

## 2014-04-08 NOTE — Progress Notes (Signed)
[place] TEAM 1 - Stepdown/ICU TEAM Progress Note  [name] OHY:[id number] DOB: 1933-01-27 DOA: 04/04/2014 PCP: Garwin Brothers, MD  Admit HPI / Brief Narrative: 80 y/o BM PMHx vascular disease including stroke x2, ischemic cardiomyopathy (EF 25-30%), DM2, HTN, CKD, bilateral BKA, and seizure disorder who was noted to have altered mental status and possible L sided weakness at his SNF today. The patient's son reports that since his most recent stroke earlier this year, the patient has a dense right-sided hemiparesis, and really only has limited functional use of his LUE. They report he has a moderate expressive aphasia, and a mild baseline encephalopathy, but is able to speak and generally be understood. On the day of admission he was noted to be essentially non-verbal, with a questionable left-sided facial droop. He was unable to take PO. The patient was found to be hypoxic and was sent to the ED. He was found to have a low GCS on arrival, and was intubated. Critical care was consulted for further management  HPI/Subjective: 9/1 A./O. x0 patient mumbles unintelligibly, does not follow commands.  Assessment/Plan: Sepsis -7-10 days of antibiotics secondary to HCAP -Prednisone 50 mg daily -DuoNeb QID -Flutter valve q4hr when awake (if patient will participate)  HCAP -See sepsis  Metabolic Encephalopathy -Acute on chronic will need to contact family to determine patient's baseline  Seizure disorder -Stable, continue Keppra 1000 mg BID -Obtain Keppra level -Continue Dilantin 100 mg BID -Obtain Dilantin level  Chronic Systolic Heart Failure -Stable -Continue Coreg 3.125 mg BID  AKI -Resolved  Hyperglycemic Hyperosmolar State -Continue moderate SSI -9/1 patient failed swallow study -Continue pivot 1.5 CAL at 12ml/hr per NG tube -Contact family PEG tube?  Bilateral BKA -Stable No skin breakdown visible    Code Status: DNR Family Communication: None present at time of  exam Disposition Plan:     Consultants: Dr. Doree Fudge (PCCM.)   Procedure/Significant Events: 8/28 CT head without contrast; Progressed left MCA territory encephalomalacia since June at which time left ICA occlusion was demonstrated by CTA.  - Stable chronic right subdural/dural thickening. No new intracranial hemorrhage 8/28 PCXR;Increased bibasilar airspace opacities;atelectasis, aspiration or pneumonia? 8/28 acute abdominal series;No evidence of obstruction. Moderate stool burden  ETT - placed in ED 8/28  PIV x2 (18 ga)  OGT - placed in ED 8/28 8/29 echocardiogram;Left ventricle: mild LVH. -LVEF=  45%- 50%.     Culture Blood cx from ED x2 - 8/28  Urine cx from ED - 8/28   Antibiotics: Cefepime - 8/28>> stopped 9/1-  Vanc - 8/28->> stopped 9/1 Levofloxacin 9/1>>  DVT prophylaxis: Heparin subcutaneous   Devices NA   LINES / TUBES:  8/30 NG tube 3fr    Continuous Infusions: . feeding supplement (PIVOT 1.5 CAL) 1,000 mL (04/08/14 1600)    Objective: VITAL SIGNS: Temp: 97.9 F (36.6 C) (09/01 1612) Temp src: Oral (09/01 1612) BP: 111/40 mmHg (09/01 1612) Pulse Rate: 88 (09/01 1612) SPO2; FIO2:   Intake/Output Summary (Last 24 hours) at 04/08/14 1812 Last data filed at 04/08/14 1400  Gross per 24 hour  Intake 1787.5 ml  Output    605 ml  Net 1182.5 ml     Exam: General: A./O. x0, mumbles unintelligibly, NAD, No acute respiratory distress Lungs: Clear to auscultation bilaterally without wheezes or crackles Cardiovascular: Regular rate and rhythm without murmur gallop or rub normal S1 and S2 Abdomen: Nontender, nondistended, soft, bowel sounds positive, no rebound, no ascites, no appreciable mass Extremities: No significant cyanosis, clubbing, or edema  bilateral lower extremities  Data Reviewed: Basic Metabolic Panel:  Recent Labs Lab 04/04/14 2147 04/05/14 0155 04/05/14 0610 04/06/14 0243 04/07/14 0244 04/08/14 0226  NA  156* 154* 154* 146 145 143  K 3.9 3.8 4.0 3.6* 3.4* 3.5*  CL 120* 122* 123* 116* 115* 112  CO2 21 18* 21 17* 18* 19  GLUCOSE 353* 218* 155* 232* 132* 127*  BUN 45* 42* 38* 24* 12 11  CREATININE 1.52* 1.24 1.25 0.77 0.66 0.60  CALCIUM 7.8* 7.9* 7.6* 8.0* 7.9* 7.9*  MG 2.0  --  2.0  --   --   --   PHOS 3.9  --  2.8  --   --   --    Liver Function Tests:  Recent Labs Lab 04/04/14 1706 04/04/14 2147 04/06/14 0243 04/07/14 0244  AST 29 30 20 21   ALT 30 25 18 16   ALKPHOS 110 90 74 80  BILITOT 0.6 0.5 0.3 0.4  PROT 6.6 5.8* 5.2* 5.4*  ALBUMIN 2.1* 1.7* 1.3* 1.5*    Recent Labs Lab 04/04/14 2147  LIPASE 9*   No results found for this basename: AMMONIA,  in the last 168 hours CBC:  Recent Labs Lab 04/04/14 1706 04/04/14 1735 04/04/14 2147 04/05/14 0610 04/06/14 0243 04/07/14 0244  WBC 15.2*  --  11.7* 10.9* 10.8* 11.0*  NEUTROABS 13.2*  --   --   --   --   --   HGB 15.3 16.7 12.3* 11.2* 11.5* 11.7*  HCT 47.3 49.0 39.6 35.2* 35.8* 34.6*  MCV 103.5*  --  105.3* 101.7* 102.6* 97.2  PLT 301  --  203 192 173 219   Cardiac Enzymes:  Recent Labs Lab 04/04/14 1706  TROPONINI <0.30   BNP (last 3 results)  Recent Labs  01/24/14 2132 04/04/14 1740  PROBNP 292.1 865.8*   CBG:  Recent Labs Lab 04/07/14 1938 04/08/14 0002 04/08/14 0412 04/08/14 0800 04/08/14 1213  GLUCAP 141* 135* 99 90 138*    Recent Results (from the past 240 hour(s))  CULTURE, BLOOD (ROUTINE X 2)     Status: None   Collection Time    04/04/14  5:19 PM      Result Value Ref Range Status   Specimen Description BLOOD ARM RIGHT   Final   Special Requests BOTTLES DRAWN AEROBIC ONLY 3CC   Final   Culture  Setup Time     Final   Value: 04/04/2014 22:45     Performed at [facility]   Culture     Final   Value:        BLOOD CULTURE RECEIVED NO GROWTH TO DATE CULTURE WILL BE HELD FOR 5 DAYS BEFORE ISSUING A FINAL NEGATIVE REPORT     Performed at [facility]   Report Status  PENDING   Incomplete  CULTURE, BLOOD (ROUTINE X 2)     Status: None   Collection Time    04/04/14  5:25 PM      Result Value Ref Range Status   Specimen Description BLOOD ARM LEFT   Final   Special Requests BOTTLES DRAWN AEROBIC ONLY [facility]   Final   Culture  Setup Time     Final   Value: 04/04/2014 22:45     Performed at [facility]   Culture     Final   Value:        BLOOD CULTURE RECEIVED NO GROWTH TO DATE CULTURE WILL BE HELD FOR 5 DAYS BEFORE ISSUING A FINAL NEGATIVE  REPORT     Performed at [facility]   Report Status PENDING   Incomplete  URINE CULTURE     Status: None   Collection Time    04/04/14  5:52 PM      Result Value Ref Range Status   Specimen Description URINE, RANDOM   Final   Special Requests NONE   Final   Culture  Setup Time     Final   Value: 04/05/2014 01:49     Performed at [place]     Final   Value: NO GROWTH     Performed at [facility]   Culture     Final   Value: NO GROWTH     Performed at [facility]   Report Status 04/06/2014 FINAL   Final  MRSA PCR SCREENING     Status: Abnormal   Collection Time    04/04/14  9:17 PM      Result Value Ref Range Status   MRSA by PCR POSITIVE (*) NEGATIVE Final   Comment:            The GeneXpert MRSA Assay (FDA     approved for NASAL specimens     only), is one component of a     comprehensive MRSA colonization     surveillance program. It is not     intended to diagnose MRSA     infection nor to guide or     monitor treatment for     MRSA infections.     RESULT CALLED TO, READ BACK BY AND VERIFIED WITH:     Barbette Hair [phone] [place]     Studies:  Recent x-ray studies have been reviewed in detail by the Attending Physician  Scheduled Meds:  Scheduled Meds: . antiseptic oral rinse  7 mL Mouth Rinse QID  . aspirin  81 mg Oral Daily  . carvedilol  3.125 mg Oral BID WC  . chlorhexidine  15 mL Mouth Rinse BID  . Chlorhexidine Gluconate  Cloth  6 each Topical Q0600  . clopidogrel  75 mg Oral Q breakfast  . collagenase   Topical Daily  . insulin aspart  0-15 Units Subcutaneous 6 times per day  . levETIRAcetam  1,000 mg Oral BID  . levofloxacin  750 mg Per Tube Daily  . mupirocin ointment  1 application Nasal BID  . phenytoin  100 mg Per Tube BID    Time spent on care of this patient: 40 mins   Allie Bossier , MD   [facility] Office  [phone] Pager - [phone]  On-Call/Text Page:      [email or web address]      password [email or web address]  If 7PM-7AM, please contact night-coverage [email or web address] Password [email or web address] 04/08/2014, 6:12 PM   LOS: 4 days

## 2014-04-08 NOTE — Evaluation (Signed)
Clinical/Bedside Swallow Evaluation Patient Details  Name: [name] MRN: [id number] Date of Birth: May 14, 1933  Today's Date: 04/08/2014 Time: 2353-6144 SLP Time Calculation (min): 12 min  Past Medical History:  Past Medical History  Diagnosis Date  . Diabetes mellitus   . Hypertension   . Testicular cancer dx'd 08/2010    surg only  . CHF (congestive heart failure)   . CAD (coronary artery disease)     s/p stent to OM1 remotely, DES to mRCA 2006  . Peripheral vascular disease   . Vitamin B12 deficiency   . Diabetic retinopathy   . Diabetic peripheral neuropathy   . Cholelithiasis   . Family history of colon cancer     brother   . 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 Osteomyelitis of finger of right hand 03/2013    R middle finger  . Pressure ulcer of buttock 03/2013    stage 2  . Dysphagia     pt eats a mechanical diet    Past Surgical History:  Past Surgical History  Procedure Laterality Date  . Below knee leg amputation  2005    Right  . Below knee leg amputation  2006    Left  . Appendectomy    . Eye surgery  2012    Laser  . Ptca    . Trudee Kuster hole Right 03/04/2013    Procedure: Haskell Flirt;  Surgeon: Charlie Pitter, MD;  Location: [facility];  Service: Neurosurgery;  Laterality: Right;  Burr Holes   . Tee without cardioversion N/A 12/20/2013    Procedure: TRANSESOPHAGEAL ECHOCARDIOGRAM (TEE);  Surgeon: Lelon Perla, MD;  Location: [facility];  Service: Cardiovascular;  Laterality: N/A;   HPI:  80 yo with heart failure and multiple strokes presented from SNF with altered mental status, sepsis, HHS, and inability to protect his airway. Intubated 8/28-8/30.  Known to SLP services from prior admissions, followed for dysphagia.  Most recent evaluation was 01/25/14 due to ongoing dysphagia and concerns for aspiration.  At that time, it was recommended pt have a dysphagia 3 diet with nectar-thick liquids.     Assessment / Plan / Recommendation Clinical Impression  Pt is alert,  difficulty following commands, aphasic with unintelligible output due to chronic neurological impairment.  Consumed limited PO trials of ice chips/water with adequate anticipation/attention to bolus, consistent swallow response, + s/s of potential aspiration.  Given hx of dysphagia and aspiration, pt may benefit from instrumental swallow study to determine safest PO diet.  REC continue NPO with NG feeding pending MBS.          Aspiration Risk  Moderate    Diet Recommendation NPO;Alternative means - temporary        Other  Recommendations Recommended Consults: MBS Oral Care Recommendations:  (QD)   Follow Up Recommendations   (tba)    Frequency and Duration  (plan pending results of MBS)        Swallow Study Prior Functional Status       General Date of Onset: 04/04/14 HPI: 81 yo with heart failure and multiple strokes presenting with altered mental status, sepsis, HHS, and inability to protect his airway. Intubated 8/28-8/30.  Evaluated by SLP 01/25/14 due to ongoing hx of dysphagia and concerns for aspiration.  At that time, it was recommended pt have a dysphagia 3 diet with nectar-thick liquids.   Type of Study: Bedside swallow evaluation Previous Swallow Assessment: multiple prior assessments during prior admissions Diet Prior to this Study: NPO;Panda Temperature Spikes Noted: No  Respiratory Status: Nasal cannula History of Recent Intubation: Yes Length of Intubations (days): 2 days Date extubated: 04/06/14 Behavior/Cognition: Alert;Distractible;Requires cueing Self-Feeding Abilities: Total assist Patient Positioning: Upright in bed Baseline Vocal Quality: Clear Volitional Cough: Cognitively unable to elicit Volitional Swallow: Unable to elicit    Oral/Motor/Sensory Function Overall Oral Motor/Sensory Function: Impaired at baseline (asymmetry CN VII on right)   Ice Chips Ice chips: Within functional limits Presentation: Spoon   Thin Liquid Thin Liquid:  Impaired Presentation: Spoon Oral Phase Functional Implications: Right anterior spillage;Prolonged oral transit Pharyngeal  Phase Impairments: Throat Clearing - Immediate    Nectar Thick Nectar Thick Liquid: Not tested   Honey Thick Honey Thick Liquid: Not tested   Puree Puree: Not tested   Solid   Jenifer Struve L. Maria Antonia, [place] CCC/SLP Pager [phone]     Solid: Not tested       Juan Quam Laurice 04/08/2014,2:48 PM

## 2014-04-09 ENCOUNTER — Inpatient Hospital Stay (HOSPITAL_COMMUNITY): Payer: Medicare Other

## 2014-04-09 LAB — COMPREHENSIVE METABOLIC PANEL
ALK PHOS: 74 U/L (ref 39–117)
ALT: 11 U/L (ref 0–53)
AST: 16 U/L (ref 0–37)
Albumin: 1.3 g/dL — ABNORMAL LOW (ref 3.5–5.2)
Anion gap: 15 (ref 5–15)
BILIRUBIN TOTAL: 0.4 mg/dL (ref 0.3–1.2)
BUN: 12 mg/dL (ref 6–23)
CHLORIDE: 112 meq/L (ref 96–112)
CO2: 17 meq/L — AB (ref 19–32)
Calcium: 7.8 mg/dL — ABNORMAL LOW (ref 8.4–10.5)
Creatinine, Ser: 0.62 mg/dL (ref 0.50–1.35)
GLUCOSE: 146 mg/dL — AB (ref 70–99)
POTASSIUM: 3.1 meq/L — AB (ref 3.7–5.3)
SODIUM: 144 meq/L (ref 137–147)
Total Protein: 5.1 g/dL — ABNORMAL LOW (ref 6.0–8.3)

## 2014-04-09 LAB — CBC WITH DIFFERENTIAL/PLATELET
BASOS PCT: 0 % (ref 0–1)
Basophils Absolute: 0 10*3/uL (ref 0.0–0.1)
EOS ABS: 0.1 10*3/uL (ref 0.0–0.7)
Eosinophils Relative: 1 % (ref 0–5)
HCT: 31.2 % — ABNORMAL LOW (ref 39.0–52.0)
Hemoglobin: 10.5 g/dL — ABNORMAL LOW (ref 13.0–17.0)
Lymphocytes Relative: 16 % (ref 12–46)
Lymphs Abs: 1.6 10*3/uL (ref 0.7–4.0)
MCH: 32.6 pg (ref 26.0–34.0)
MCHC: 33.7 g/dL (ref 30.0–36.0)
MCV: 96.9 fL (ref 78.0–100.0)
Monocytes Absolute: 0.8 10*3/uL (ref 0.1–1.0)
Monocytes Relative: 8 % (ref 3–12)
NEUTROS PCT: 75 % (ref 43–77)
Neutro Abs: 7.4 10*3/uL (ref 1.7–7.7)
PLATELETS: 212 10*3/uL (ref 150–400)
RBC: 3.22 MIL/uL — ABNORMAL LOW (ref 4.22–5.81)
RDW: 14.1 % (ref 11.5–15.5)
WBC: 9.8 10*3/uL (ref 4.0–10.5)

## 2014-04-09 LAB — GLUCOSE, CAPILLARY
GLUCOSE-CAPILLARY: 164 mg/dL — AB (ref 70–99)
GLUCOSE-CAPILLARY: 139 mg/dL — AB (ref 70–99)
Glucose-Capillary: 141 mg/dL — ABNORMAL HIGH (ref 70–99)
Glucose-Capillary: 147 mg/dL — ABNORMAL HIGH (ref 70–99)
Glucose-Capillary: 155 mg/dL — ABNORMAL HIGH (ref 70–99)
Glucose-Capillary: 158 mg/dL — ABNORMAL HIGH (ref 70–99)

## 2014-04-09 LAB — PHENYTOIN LEVEL, TOTAL: Phenytoin Lvl: <2.5 ug/mL — ABNORMAL LOW (ref 10.0–20.0)

## 2014-04-09 LAB — MAGNESIUM: MAGNESIUM: 1.7 mg/dL (ref 1.5–2.5)

## 2014-04-09 MED ORDER — POTASSIUM CHLORIDE 20 MEQ/15ML (10%) PO LIQD
40.0000 meq | Freq: Once | ORAL | Status: AC
  Administered 2014-04-10: 40 meq
  Filled 2014-04-09: qty 30

## 2014-04-09 MED ORDER — IPRATROPIUM-ALBUTEROL 0.5-2.5 (3) MG/3ML IN SOLN
3.0000 mL | Freq: Two times a day (BID) | RESPIRATORY_TRACT | Status: DC
  Administered 2014-04-09 – 2014-04-10 (×2): 3 mL via RESPIRATORY_TRACT
  Filled 2014-04-09 (×2): qty 3

## 2014-04-09 NOTE — Progress Notes (Signed)
Pt. Found in bed with continuous tube feeding discontinued. On call NP, Tylene Fantasia made aware. RN will have panda tube reinserted. [name], [name]

## 2014-04-09 NOTE — Progress Notes (Signed)
[facility] PROGRESS NOTE  Jadien Lehigh WNI:[id number] DOB: 05-Jan-1933 DOA: 04/04/2014 PCP: Garwin Brothers, MD  Assessment/Plan: Sepsis  -7-10 days of antibiotics secondary to HCAP , currently on day 6 of antibiotic therapy -DuoNeb QID  -Flutter valve q4hr when awake (if patient will participate)   HCAP  -See sepsis   Metabolic Encephalopathy  - Discussed with son. Will need to see if mental status will improve with resolution of infectious etiology  Seizure disorder  -Stable, continue Keppra 1000 mg BID  -Obtain Keppra level  -Continue Dilantin 100 mg BID  -Obtain Dilantin level   Chronic Systolic Heart Failure  -Stable  -Continue Coreg 3.125 mg BID   AKI  -Resolved   Hyperglycemic Hyperosmolar State  - Continue moderate SSI  - 9/1 patient failed swallow study  - Continue pivot 1.5 CAL at 57ml/hr per NG tube  - May need to discuss peg tube placement with wife should mentation not improve.  Bilateral BKA  -stable   Code Status: DNR Family Communication:  None at bedside Disposition Plan: LTACH most likely next am.   Consultants:  None  Procedures: 8/28 CT head without contrast; Progressed left MCA territory encephalomalacia since June at which time left ICA occlusion was demonstrated by CTA.  - Stable chronic right subdural/dural thickening. No new intracranial hemorrhage  8/28 PCXR;Increased bibasilar airspace opacities;atelectasis, aspiration or pneumonia?  8/28 acute abdominal series;No evidence of obstruction. Moderate stool burden  ETT - placed in ED 8/28  PIV x2 (18 ga)  OGT - placed in ED 8/28  8/29 echocardiogram;Left ventricle: mild LVH. -LVEF= 45%- 50%.   Antibiotics:  Levaquin  HPI/Subjective: Pt pulled his NG tube. Also has been having trouble with peripheral IV line. Wife is currently Media planner. Her phone number is [phone]. Patient gazes in my direction but does not have effective communication.  Objective: Filed Vitals:    04/09/14 1130  BP: 119/60  Pulse: 94  Temp: 98.4 F (36.9 C)  Resp: 18    Intake/Output Summary (Last 24 hours) at 04/09/14 1647 Last data filed at 04/09/14 1108  Gross per 24 hour  Intake    180 ml  Output   1125 ml  Net   -945 ml   Filed Weights   04/08/14 0500 04/08/14 1612 04/09/14 0535  Weight: 99.5 kg (219 lb 5.7 oz) 98.5 kg (217 lb 2.5 oz) 96.253 kg (212 lb 3.2 oz)    Exam:   General:  Pt in nad, awake and alert. Does not interact with examiner verbally  Cardiovascular: no cyanosis, rrr  Respiratory: no increased wob, no wheezes  Abdomen: soft, NT,   Musculoskeletal: no cyanosis or clubbing, right hemiparesis   Data Reviewed: Basic Metabolic Panel:  Recent Labs Lab 04/04/14 2147  04/05/14 0610 04/06/14 0243 04/07/14 0244 04/08/14 0226 04/09/14 0700  NA 156*  < > 154* 146 145 143 144  K 3.9  < > 4.0 3.6* 3.4* 3.5* 3.1*  CL 120*  < > 123* 116* 115* 112 112  CO2 21  < > 21 17* 18* 19 17*  GLUCOSE 353*  < > 155* 232* 132* 127* 146*  BUN 45*  < > 38* 24* 12 11 12   CREATININE 1.52*  < > 1.25 0.77 0.66 0.60 0.62  CALCIUM 7.8*  < > 7.6* 8.0* 7.9* 7.9* 7.8*  MG 2.0  --  2.0  --   --   --  1.7  PHOS 3.9  --  2.8  --   --   --   --   < > =  values in this interval not displayed. Liver Function Tests:  Recent Labs Lab 04/04/14 1706 04/04/14 2147 04/06/14 0243 04/07/14 0244 04/09/14 0700  AST 29 30 20 21 16   ALT 30 25 18 16 11   ALKPHOS 110 90 74 80 74  BILITOT 0.6 0.5 0.3 0.4 0.4  PROT 6.6 5.8* 5.2* 5.4* 5.1*  ALBUMIN 2.1* 1.7* 1.3* 1.5* 1.3*    Recent Labs Lab 04/04/14 2147  LIPASE 9*   No results found for this basename: AMMONIA,  in the last 168 hours CBC:  Recent Labs Lab 04/04/14 1706  04/04/14 2147 04/05/14 0610 04/06/14 0243 04/07/14 0244 04/09/14 0700  WBC 15.2*  --  11.7* 10.9* 10.8* 11.0* 9.8  NEUTROABS 13.2*  --   --   --   --   --  7.4  HGB 15.3  < > 12.3* 11.2* 11.5* 11.7* 10.5*  HCT 47.3  < > 39.6 35.2* 35.8* 34.6*  31.2*  MCV 103.5*  --  105.3* 101.7* 102.6* 97.2 96.9  PLT 301  --  203 192 173 219 212  < > = values in this interval not displayed. Cardiac Enzymes:  Recent Labs Lab 04/04/14 1706  TROPONINI <0.30   BNP (last 3 results)  Recent Labs  01/24/14 2132 04/04/14 1740  PROBNP 292.1 865.8*   CBG:  Recent Labs Lab 04/08/14 2020 04/09/14 0005 04/09/14 0452 04/09/14 0915 04/09/14 1213  GLUCAP 187* 141* 155* 158* 164*    Recent Results (from the past 240 hour(s))  CULTURE, BLOOD (ROUTINE X 2)     Status: None   Collection Time    04/04/14  5:19 PM      Result Value Ref Range Status   Specimen Description BLOOD ARM RIGHT   Final   Special Requests BOTTLES DRAWN AEROBIC ONLY 3CC   Final   Culture  Setup Time     Final   Value: 04/04/2014 22:45     Performed at [facility]   Culture     Final   Value:        BLOOD CULTURE RECEIVED NO GROWTH TO DATE CULTURE WILL BE HELD FOR 5 DAYS BEFORE ISSUING A FINAL NEGATIVE REPORT     Performed at [facility]   Report Status PENDING   Incomplete  CULTURE, BLOOD (ROUTINE X 2)     Status: None   Collection Time    04/04/14  5:25 PM      Result Value Ref Range Status   Specimen Description BLOOD ARM LEFT   Final   Special Requests BOTTLES DRAWN AEROBIC ONLY [facility]   Final   Culture  Setup Time     Final   Value: 04/04/2014 22:45     Performed at [facility]   Culture     Final   Value:        BLOOD CULTURE RECEIVED NO GROWTH TO DATE CULTURE WILL BE HELD FOR 5 DAYS BEFORE ISSUING A FINAL NEGATIVE REPORT     Performed at [facility]   Report Status PENDING   Incomplete  URINE CULTURE     Status: None   Collection Time    04/04/14  5:52 PM      Result Value Ref Range Status   Specimen Description URINE, RANDOM   Final   Special Requests NONE   Final   Culture  Setup Time     Final   Value: 04/05/2014 01:49     Performed at [facility]  Colony Count     Final   Value: NO GROWTH      Performed at [facility]   Culture     Final   Value: NO GROWTH     Performed at [facility]   Report Status 04/06/2014 FINAL   Final  MRSA PCR SCREENING     Status: Abnormal   Collection Time    04/04/14  9:17 PM      Result Value Ref Range Status   MRSA by PCR POSITIVE (*) NEGATIVE Final   Comment:            The GeneXpert MRSA Assay (FDA     approved for NASAL specimens     only), is one component of a     comprehensive MRSA colonization     surveillance program. It is not     intended to diagnose MRSA     infection nor to guide or     monitor treatment for     MRSA infections.     RESULT CALLED TO, READ BACK BY AND VERIFIED WITH:     Barbette Hair [phone] WILDERK     Studies: Dg Abd Portable 1v  04/08/2014   CLINICAL DATA:  Panda tube confirmation  EXAM: PORTABLE ABDOMEN - 1 VIEW  COMPARISON:  04/07/2014  FINDINGS: Feeding tube tip projects over the proximal stomach. Hemidiaphragms/ upper abdomen and periphery of the left hemi abdomen excluded from the field of view. Bowel gas pattern nonspecific, nonobstructive. Pelvic thermistor wire noted. Multilevel degenerative changes.  IMPRESSION: Feeding tube tip projects over the proximal stomach. Recommend advancement.   Electronically Signed   By: Carlos Levering M.D.   On: 04/08/2014 23:46   Dg Swallowing Func-speech Pathology  04/09/2014   Earle Gell McCoy, CCC-SLP     04/09/2014  9:55 AM Objective Swallowing Evaluation: Modified Barium Swallowing Study   Patient Details  Name: Kendon Sedeno MRN: [id number] Date of Birth: [birth year]  Today's Date: 04/09/2014 Time: 0925-0950 SLP Time Calculation (min): 25 min  Past Medical History:  Past Medical History  Diagnosis Date  . Diabetes mellitus   . Hypertension   . Testicular cancer dx'd 08/2010    surg only  . CHF (congestive heart failure)   . CAD (coronary artery disease)     s/p stent to OM1 remotely, DES to mRCA 2006  . Peripheral vascular disease   . Vitamin B12 deficiency    . Diabetic retinopathy   . Diabetic peripheral neuropathy   . Cholelithiasis   . Family history of colon cancer     brother   . 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 Osteomyelitis of finger of right hand 03/2013    R middle finger  . Pressure ulcer of buttock 03/2013    stage 2  . Dysphagia     pt eats a mechanical diet    Past Surgical History:  Past Surgical History  Procedure Laterality Date  . Below knee leg amputation  2005    Right  . Below knee leg amputation  2006    Left  . Appendectomy    . Eye surgery  2012    Laser  . Ptca    . Trudee Kuster hole Right 03/04/2013    Procedure: Haskell Flirt;  Surgeon: Charlie Pitter, MD;  Location:  [facility];  Service: Neurosurgery;  Laterality: Right;  Burr  Holes   . Tee without cardioversion N/A 12/20/2013    Procedure: TRANSESOPHAGEAL ECHOCARDIOGRAM (TEE);  Surgeon:  Lelon Perla, MD;  Location: [facility] ENDOSCOPY;  Service:  Cardiovascular;  Laterality: N/A;   HPI:  80 yo with heart failure and multiple strokes presenting with  altered mental status, sepsis, HHS, and inability to protect his  airway. Intubated 8/28-8/30.  Evaluated by SLP 01/25/14 due to  ongoing hx of dysphagia and concerns for aspiration.  At that  time, it was recommended pt have a dysphagia 3 diet with  nectar-thick liquids.       Assessment / Plan / Recommendation Clinical Impression  Dysphagia Diagnosis: Moderate pharyngeal phase dysphagia;Severe  oral phase dysphagia Clinical impression: MBS complete. Patient presented with intact  ability to protect airway with limited po trials provided today.  No penetration or aspiration observed however cognitively,  pateint with severely delayed oral transit of bolus and  significant delay in swallow initiation, at times with bolus  sitting in the pyriform sinuses for an excess of 4 minutes. SLP  provided max verbal, visual, and tactile cueing in attempts to  quicken initiation of swallow without success. At this time,  despite ability to protect airway during todays study,  aspiration  risk high. Prognosis for ability to resume pos good with improved  mentation.     Treatment Recommendation  F/U MBS in ___ days (Comment)    Diet Recommendation NPO;Alternative means - temporary        Other  Recommendations Oral Care Recommendations:  (QD)   Follow Up Recommendations   (tba)    Frequency and Duration min 2x/week  2 weeks           General Date of Onset: 04/04/14 HPI: 80 yo with heart failure and multiple strokes presenting  with altered mental status, sepsis, HHS, and inability to protect  his airway. Intubated 8/28-8/30.  Evaluated by SLP 01/25/14 due to  ongoing hx of dysphagia and concerns for aspiration.  At that  time, it was recommended pt have a dysphagia 3 diet with  nectar-thick liquids.   Type of Study: Modified Barium Swallowing Study Reason for Referral: Objectively evaluate swallowing function Previous Swallow Assessment: multiple prior assessments during  prior admissions Diet Prior to this Study: NPO;Panda Temperature Spikes Noted: No Respiratory Status: Nasal cannula History of Recent Intubation: Yes Length of Intubations (days): 2 days Date extubated: 04/06/14 Behavior/Cognition: Alert;Distractible;Requires cueing;Doesn't  follow directions Oral Cavity - Dentition: Edentulous Oral Motor / Sensory Function: Impaired - see Bedside swallow  eval Self-Feeding Abilities: Needs assist Patient Positioning: Upright in chair Baseline Vocal Quality: Clear Volitional Cough: Cognitively unable to elicit Volitional Swallow: Unable to elicit Anatomy: Within functional limits Pharyngeal Secretions: Not observed secondary MBS    Reason for Referral Objectively evaluate swallowing function   Oral Phase Oral Preparation/Oral Phase Oral Phase: Impaired Oral - Nectar Oral - Nectar Teaspoon: Lingual pumping;Weak lingual  manipulation;Incomplete tongue to palate contact;Reduced  posterior propulsion;Holding of bolus;Lingual/palatal  residue;Delayed oral transit Oral - Nectar Cup: Lingual  pumping;Weak lingual  manipulation;Incomplete tongue to palate contact;Reduced  posterior propulsion;Holding of bolus;Lingual/palatal  residue;Delayed oral transit Oral - Solids Oral - Puree: Lingual pumping;Weak lingual  manipulation;Incomplete tongue to palate contact;Reduced  posterior propulsion;Holding of bolus;Lingual/palatal  residue;Delayed oral transit   Pharyngeal Phase Pharyngeal Phase Pharyngeal Phase: Impaired Pharyngeal - Nectar Pharyngeal - Nectar Teaspoon: Delayed swallow  initiation;Premature spillage to pyriform sinuses Pharyngeal - Nectar Cup: Delayed swallow initiation;Premature  spillage to pyriform sinuses Pharyngeal - Solids Pharyngeal - Puree: Delayed swallow initiation;Premature spillage  to pyriform sinuses  Cervical Esophageal Phase    GO  [name] Rainwater MA, CCC-SLP [phone]            McCoy Leah Meryl 04/09/2014, 9:54 AM     Scheduled Meds: . antiseptic oral rinse  7 mL Mouth Rinse QID  . aspirin  81 mg Oral Daily  . carvedilol  3.125 mg Oral BID WC  . chlorhexidine  15 mL Mouth Rinse BID  . clopidogrel  75 mg Oral Q breakfast  . collagenase   Topical Daily  . heparin subcutaneous  5,000 Units Subcutaneous 3 times per day  . insulin aspart  0-15 Units Subcutaneous 6 times per day  . ipratropium-albuterol  3 mL Nebulization BID  . levETIRAcetam  1,000 mg Oral BID  . levofloxacin  750 mg Per Tube Daily  . phenytoin  100 mg Per Tube BID   Continuous Infusions: . feeding supplement (PIVOT 1.5 CAL) 1,000 mL (04/08/14 1600)     Time spent: > 35 minutes    Velvet Bathe  [facility] Pager [phone]. If 7PM-7AM, please contact night-coverage at [email or web address], password [facility] 04/09/2014, 4:47 PM  LOS: 5 days

## 2014-04-09 NOTE — Clinical Social Work Psychosocial (Signed)
Clinical Social Work Department BRIEF PSYCHOSOCIAL ASSESSMENT 04/09/2014  Patient:  [name], [name]     Account Number:  [id number]     Admit date:  04/04/2014  Clinical Social Worker:  Delrae Sawyers  Date/Time:  04/09/2014 12:57 PM  Referred by:  Physician  Date Referred:  04/09/2014 Referred for  SNF Placement   Other Referral:   none.   Interview type:  Family Other interview type:   CSW spoke with pt's wife, Nazir Hacker, who deferred conversation to pt's son, Lucus. CSW spoke with pt's son, Amillion Macchia ([phone]) regarding pt's discharge disposition.    PSYCHOSOCIAL DATA Living Status:  FACILITY Admitted from facility:  [facility] Level of care:  [facility] Primary support name:  Hartwell Vandiver Primary support relationship to patient:  CHILD, ADULT Degree of support available:   Strong support system.    CURRENT CONCERNS Current Concerns  Post-Acute Placement   Other Concerns:   none.    SOCIAL WORK ASSESSMENT / PLAN CSW received referral for SNF placement at time of discharge. CSW spoke with pt's son, who stated pt is admitted from [facility]. Pt's son expressed desire to discharge pt to [facility], but is unable to do so due to an outstanding balance with [facility] SNF. Pt's son stated he is agreeable to pt returning to [facility] SNF at time of discharge while pt family works out Hotel manager with [employer]. CSW to continue to follow and assist with discharge planning needs.   Assessment/plan status:  Psychosocial Support/Ongoing Assessment of Needs Other assessment/ plan:   none.   Information/referral to community resources:   Pt to return to [facility].    PATIENT'S/FAMILY'S RESPONSE TO PLAN OF CARE: Pt's son understanding and agreeable to CSW plan of care. Pt's son expressed no further questions or concerns at this time.       Lubertha Sayres, MSW, [facility] Licensed Clinical  Social Worker [phone] and [phone]

## 2014-04-09 NOTE — Progress Notes (Signed)
Notified hospitalist of patient with potassium of 3.1, orders given. Will carry out orders. Reinserted flexi-seal (rectal tube) for it was not in place. Flexi-seal in place and is working properly. Will continue to monitor to end of shift.

## 2014-04-09 NOTE — Progress Notes (Signed)
NUTRITION FOLLOW UP  Intervention:    Continue TF via NGT with Pivot 1.5 at 60 ml/h. At goal rate of 60 ml/h. TF provides 2160 kcals, 135 gm protein, 1093 ml free water daily.  Add 160 ml free water flushes every 3 hours via NGT to provide an additional 1280 ml for a total of 2373 ml of water daily  Nutrition Dx:   Increased nutrient needs related to sacral wound as evidenced by estimated calorie and protein needs; ongoing  Goal:   Intake to meet >90% of estimated nutrition needs. Unmet.  Monitor:   TF tolerance/adequacy, weight trend, labs, wound healing.  Assessment:   Pt with hx of HTN, DM, CAD, CHF, and reportedly a CVA presents for AMS by EMS from nursing home after being found unresponsive. Intubated as he could not protect his airway.   Pt out of room for panda tube placement per RN. Per nursing notes pt was last receiving Pivot 1.5 at goal rate of 60 ml/hr yesterday at 4 pm; panda tube was out of place so TF's have been held since. Pt's weight is stable.  Labs reviewed: elevated glucose, low potassium, low calcium, low albumin  Height: Ht Readings from Last 1 Encounters:  04/04/14 6' (1.829 m)    Weight Status:   Wt Readings from Last 1 Encounters:  04/09/14 212 lb 3.2 oz (96.253 kg)  04/05/14 208 lb   Re-estimated needs:  Kcal: 2150-2350 Protein: 120-140 gm Fluid: 2.4 L  Skin: unstageable coccyx pressure ulcer, stage 2 scrotum pressure ulcer, bilateral BKA; +2 RUE and LUE edema  Diet Order: NPO   Intake/Output Summary (Last 24 hours) at 04/09/14 1428 Last data filed at 04/09/14 1108  Gross per 24 hour  Intake    180 ml  Output   1125 ml  Net   -945 ml    Last BM: 9/1 (diarrhea per nursing notes)  Labs:   Recent Labs Lab 04/04/14 2147  04/05/14 0610  04/07/14 0244 04/08/14 0226 04/09/14 0700  NA 156*  < > 154*  < > 145 143 144  K 3.9  < > 4.0  < > 3.4* 3.5* 3.1*  CL 120*  < > 123*  < > 115* 112 112  CO2 21  < > 21  < > 18* 19 17*  BUN 45*  <  > 38*  < > 12 11 12   CREATININE 1.52*  < > 1.25  < > 0.66 0.60 0.62  CALCIUM 7.8*  < > 7.6*  < > 7.9* 7.9* 7.8*  MG 2.0  --  2.0  --   --   --  1.7  PHOS 3.9  --  2.8  --   --   --   --   GLUCOSE 353*  < > 155*  < > 132* 127* 146*  < > = values in this interval not displayed.  CBG (last 3)   Recent Labs  04/09/14 0452 04/09/14 0915 04/09/14 1213  GLUCAP 155* 158* 164*    Scheduled Meds: . antiseptic oral rinse  7 mL Mouth Rinse QID  . aspirin  81 mg Oral Daily  . carvedilol  3.125 mg Oral BID WC  . chlorhexidine  15 mL Mouth Rinse BID  . clopidogrel  75 mg Oral Q breakfast  . collagenase   Topical Daily  . heparin subcutaneous  5,000 Units Subcutaneous 3 times per day  . insulin aspart  0-15 Units Subcutaneous 6 times per day  . ipratropium-albuterol  3 mL Nebulization Q4H  . levETIRAcetam  1,000 mg Oral BID  . levofloxacin  750 mg Per Tube Daily  . phenytoin  100 mg Per Tube BID    Continuous Infusions: . feeding supplement (PIVOT 1.5 CAL) 1,000 mL (04/08/14 1600)    Pryor Ochoa RD, LDN Inpatient Clinical Dietitian Pager: [phone] After Hours Pager: [phone]

## 2014-04-09 NOTE — Progress Notes (Signed)
Aware of patient transfer to [place]- handoff report provided by Lubertha Sayres, [place] discussed patient with [place]. Referral made to [facility] [facility] and bed offer received from [facility]. CSW spoke to patient's son Jafeth Mustin- [phone] and discussed LTAC referral.  Son is agreeable to placement at [facility] stating that he has an aunt who works there- so he is familiar with the facility. His aunt can check on patient as well.  CSW notified [place] of above. She will discuss with MD to determine possible d/c today to [facility].  Son is in agreement.  CSW attempted to discuss with patient- his speech is garbled and he does not appear to fully comprehend above information. CSW will monitor in case this plan changes; patient cannot return to [facility] at this time as he has a Panda tube in place.  Lorie Phenix. Pauline Good, [place]

## 2014-04-09 NOTE — Procedures (Signed)
Objective Swallowing Evaluation: Modified Barium Swallowing Study  Patient Details  Name: [name] MRN: [id number] Date of Birth: [birth year]  Today's Date: 04/09/2014 Time: 0925-0950 SLP Time Calculation (min): 25 min  Past Medical History:  Past Medical History  Diagnosis Date  . Diabetes mellitus   . Hypertension   . Testicular cancer dx'd 08/2010    surg only  . CHF (congestive heart failure)   . CAD (coronary artery disease)     s/p stent to OM1 remotely, DES to mRCA 2006  . Peripheral vascular disease   . Vitamin B12 deficiency   . Diabetic retinopathy   . Diabetic peripheral neuropathy   . Cholelithiasis   . Family history of colon cancer     brother   . 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 Osteomyelitis of finger of right hand 03/2013    R middle finger  . Pressure ulcer of buttock 03/2013    stage 2  . Dysphagia     pt eats a mechanical diet    Past Surgical History:  Past Surgical History  Procedure Laterality Date  . Below knee leg amputation  2005    Right  . Below knee leg amputation  2006    Left  . Appendectomy    . Eye surgery  2012    Laser  . Ptca    . Trudee Kuster hole Right 03/04/2013    Procedure: Haskell Flirt;  Surgeon: Charlie Pitter, MD;  Location: [facility];  Service: Neurosurgery;  Laterality: Right;  Burr Holes   . Tee without cardioversion N/A 12/20/2013    Procedure: TRANSESOPHAGEAL ECHOCARDIOGRAM (TEE);  Surgeon: Lelon Perla, MD;  Location: [facility] END[facility]COPY;  Service: Cardiovascular;  Laterality: N/A;   HPI:  80 yo with heart failure and multiple strokes presenting with altered mental status, sepsis, HHS, and inability to protect his airway. Intubated 8/28-8/30.  Evaluated by SLP 01/25/14 due to ongoing hx of dysphagia and concerns for aspiration.  At that time, it was recommended pt have a dysphagia 3 diet with nectar-thick liquids.       Assessment / Plan / Recommendation Clinical Impression  Dysphagia Diagnosis: Moderate pharyngeal phase  dysphagia;Severe oral phase dysphagia Clinical impression: MBS complete. Patient presented with intact ability to protect airway with limited po trials provided today. No penetration or aspiration observed however cognitively, pateint with severely delayed oral transit of bolus and significant delay in swallow initiation, at times with bolus sitting in the pyriform sinuses for an excess of 4 minutes. SLP provided max verbal, visual, and tactile cueing in attempts to quicken initiation of swallow without success. At this time, despite ability to protect airway during todays study, aspiration risk high. Prognosis for ability to resume pos good with improved mentation.     Treatment Recommendation  F/U MBS in ___ days (Comment)    Diet Recommendation NPO;Alternative means - temporary        Other  Recommendations Oral Care Recommendations:  (QD)   Follow Up Recommendations   (tba)    Frequency and Duration min 2x/week  2 weeks           General Date of Onset: 04/04/14 HPI: 80 yo with heart failure and multiple strokes presenting with altered mental status, sepsis, HHS, and inability to protect his airway. Intubated 8/28-8/30.  Evaluated by SLP 01/25/14 due to ongoing hx of dysphagia and concerns for aspiration.  At that time, it was recommended pt have a dysphagia 3 diet with nectar-thick liquids.   Type of  Study: Modified Barium Swallowing Study Reason for Referral: Objectively evaluate swallowing function Previous Swallow Assessment: multiple prior assessments during prior admissions Diet Prior to this Study: NPO;Panda Temperature Spikes Noted: No Respiratory Status: Nasal cannula History of Recent Intubation: Yes Length of Intubations (days): 2 days Date extubated: 04/06/14 Behavior/Cognition: Alert;Distractible;Requires cueing;Doesn't follow directions Oral Cavity - Dentition: Edentulous Oral Motor / Sensory Function: Impaired - see Bedside swallow eval Self-Feeding Abilities:  Needs assist Patient Positioning: Upright in chair Baseline Vocal Quality: Clear Volitional Cough: Cognitively unable to elicit Volitional Swallow: Unable to elicit Anatomy: Within functional limits Pharyngeal Secretions: Not observed secondary MBS    Reason for Referral Objectively evaluate swallowing function   Oral Phase Oral Preparation/Oral Phase Oral Phase: Impaired Oral - Nectar Oral - Nectar Teaspoon: Lingual pumping;Weak lingual manipulation;Incomplete tongue to palate contact;Reduced posterior propulsion;Holding of bolus;Lingual/palatal residue;Delayed oral transit Oral - Nectar Cup: Lingual pumping;Weak lingual manipulation;Incomplete tongue to palate contact;Reduced posterior propulsion;Holding of bolus;Lingual/palatal residue;Delayed oral transit Oral - Solids Oral - Puree: Lingual pumping;Weak lingual manipulation;Incomplete tongue to palate contact;Reduced posterior propulsion;Holding of bolus;Lingual/palatal residue;Delayed oral transit   Pharyngeal Phase Pharyngeal Phase Pharyngeal Phase: Impaired Pharyngeal - Nectar Pharyngeal - Nectar Teaspoon: Delayed swallow initiation;Premature spillage to pyriform sinuses Pharyngeal - Nectar Cup: Delayed swallow initiation;Premature spillage to pyriform sinuses Pharyngeal - Solids Pharyngeal - Puree: Delayed swallow initiation;Premature spillage to pyriform sinuses  Cervical Esophageal Phase    GO   [name] Rainwater MA, CCC-SLP [phone]            Emilynn Srinivasan Meryl 04/09/2014, 9:54 AM

## 2014-04-10 ENCOUNTER — Ambulatory Visit: Payer: Medicare Other | Admitting: Nurse Practitioner

## 2014-04-10 LAB — CBC WITH DIFFERENTIAL/PLATELET
Basophils Absolute: 0 10*3/uL (ref 0.0–0.1)
Basophils Relative: 0 % (ref 0–1)
EOS ABS: 0.2 10*3/uL (ref 0.0–0.7)
Eosinophils Relative: 2 % (ref 0–5)
HCT: 33.8 % — ABNORMAL LOW (ref 39.0–52.0)
Hemoglobin: 11.3 g/dL — ABNORMAL LOW (ref 13.0–17.0)
LYMPHS ABS: 1.5 10*3/uL (ref 0.7–4.0)
LYMPHS PCT: 17 % (ref 12–46)
MCH: 32.3 pg (ref 26.0–34.0)
MCHC: 33.4 g/dL (ref 30.0–36.0)
MCV: 96.6 fL (ref 78.0–100.0)
Monocytes Absolute: 0.8 10*3/uL (ref 0.1–1.0)
Monocytes Relative: 10 % (ref 3–12)
NEUTROS PCT: 71 % (ref 43–77)
Neutro Abs: 6.1 10*3/uL (ref 1.7–7.7)
PLATELETS: 218 10*3/uL (ref 150–400)
RBC: 3.5 MIL/uL — ABNORMAL LOW (ref 4.22–5.81)
RDW: 14.3 % (ref 11.5–15.5)
WBC: 8.6 10*3/uL (ref 4.0–10.5)

## 2014-04-10 LAB — CULTURE, BLOOD (ROUTINE X 2)
Culture: NO GROWTH
Culture: NO GROWTH

## 2014-04-10 LAB — GLUCOSE, CAPILLARY
GLUCOSE-CAPILLARY: 175 mg/dL — AB (ref 70–99)
GLUCOSE-CAPILLARY: 188 mg/dL — AB (ref 70–99)
Glucose-Capillary: 180 mg/dL — ABNORMAL HIGH (ref 70–99)
Glucose-Capillary: 214 mg/dL — ABNORMAL HIGH (ref 70–99)

## 2014-04-10 LAB — MAGNESIUM: MAGNESIUM: 2.2 mg/dL (ref 1.5–2.5)

## 2014-04-10 LAB — COMPREHENSIVE METABOLIC PANEL
ALT: 15 U/L (ref 0–53)
AST: 22 U/L (ref 0–37)
Albumin: 2.9 g/dL — ABNORMAL LOW (ref 3.5–5.2)
Alkaline Phosphatase: 73 U/L (ref 39–117)
Anion gap: 13 (ref 5–15)
BUN: 31 mg/dL — ABNORMAL HIGH (ref 6–23)
CALCIUM: 8.8 mg/dL (ref 8.4–10.5)
CO2: 24 meq/L (ref 19–32)
CREATININE: 1.32 mg/dL (ref 0.50–1.35)
Chloride: 104 mEq/L (ref 96–112)
GFR calc Af Amer: 58 mL/min — ABNORMAL LOW (ref >90)
GFR, EST NON AFRICAN AMERICAN: 50 mL/min — AB (ref >90)
GLUCOSE: 110 mg/dL — AB (ref 70–99)
Potassium: 4.8 mEq/L (ref 3.7–5.3)
Sodium: 141 mEq/L (ref 137–147)
Total Bilirubin: 0.4 mg/dL (ref 0.3–1.2)
Total Protein: 6 g/dL (ref 6.0–8.3)

## 2014-04-10 MED ORDER — ACETAMINOPHEN 160 MG/5ML PO SOLN
500.0000 mg | Freq: Four times a day (QID) | ORAL | Status: DC | PRN
  Administered 2014-04-10: 500 mg
  Filled 2014-04-10: qty 20

## 2014-04-10 MED ORDER — LEVOFLOXACIN 750 MG PO TABS: 750.0000 mg | ORAL_TABLET | Freq: Every day | ORAL | Status: AC

## 2014-04-10 MED ORDER — ACETAMINOPHEN 160 MG/5ML PO SOLN: 500.0000 mg | Freq: Four times a day (QID) | ORAL | Status: AC | PRN

## 2014-04-10 MED ORDER — ASPIRIN 81 MG PO CHEW: 81.0000 mg | CHEWABLE_TABLET | Freq: Every day | ORAL | Status: AC

## 2014-04-10 MED ORDER — PHENYTOIN 125 MG/5ML PO SUSP: 100.0000 mg | Freq: Two times a day (BID) | ORAL | Status: AC

## 2014-04-10 MED ORDER — CHLORHEXIDINE GLUCONATE 0.12 % MT SOLN: 15.0000 mL | Freq: Two times a day (BID) | OROMUCOSAL | Status: AC

## 2014-04-10 MED ORDER — PIVOT 1.5 CAL PO LIQD: 1000.0000 mL | ORAL | Status: AC

## 2014-04-10 MED ORDER — IPRATROPIUM-ALBUTEROL 0.5-2.5 (3) MG/3ML IN SOLN: 3.0000 mL | Freq: Two times a day (BID) | RESPIRATORY_TRACT | Status: AC

## 2014-04-10 MED ORDER — LEVETIRACETAM 100 MG/ML PO SOLN: 1000.0000 mg | Freq: Two times a day (BID) | ORAL | Status: AC

## 2014-04-10 MED ORDER — ESCITALOPRAM OXALATE 10 MG PO TABS: 10.0000 mg | ORAL_TABLET | Freq: Every day | ORAL | Status: AC

## 2014-04-10 MED ORDER — CARVEDILOL 3.125 MG PO TABS: 3.1250 mg | ORAL_TABLET | Freq: Two times a day (BID) | ORAL | Status: AC

## 2014-04-10 MED ORDER — CLOPIDOGREL BISULFATE 75 MG PO TABS: 75.0000 mg | ORAL_TABLET | Freq: Every day | ORAL | Status: AC

## 2014-04-10 NOTE — Progress Notes (Signed)
Notified hospitalist of patient grimacing and moaning from time to time. Patient appeared to be in some form of pain but patient not able to describe pain for words are garbled. Orders given for Tylenol PRN. Tylenol was given per PRN orders and was effective. Will continue to monitor patient to end of shift.

## 2014-04-10 NOTE — Discharge Summary (Signed)
Physician Discharge Summary  [name] FHL:[id number] DOB: 10-18-1932 DOA: 04/04/2014  PCP: Garwin Brothers, MD  Admit date: 04/04/2014 Discharge date: 04/10/2014  Time spent: > 35 minutes  Recommendations for Outpatient Follow-up:  1. Patient has received 7 days of antibiotic therapy. Please decide whether or not patient will need a more prolonged antibiotic treatment regimen 2. If mentation does not go back to baseline and patient continued to have difficulty swallowing, please discuss feeding tube or further plans to address nutritional status  Discharge Diagnoses:  Please see list below  Discharge Condition: stable  Diet recommendation: Low sodium heart healthy  Filed Weights   04/08/14 1612 04/09/14 0535 04/10/14 0419  Weight: 98.5 kg (217 lb 2.5 oz) 96.253 kg (212 lb 3.2 oz) 96.5 kg (212 lb 11.9 oz)    History of present illness:  From original HPI  80 y/o man with heart failure and multiple strokes presenting with altered mental status, sepsis of pulmonary origin, HHS, and inability to protect his airway.  Hospital Course:   Sepsis  -7-10 days of antibiotics secondary to HCAP , currently on day 7 of antibiotic therapy  -DuoNeb QID  -Flutter valve q4hr when awake (if patient will participate)   HCAP  -See above  Metabolic Encephalopathy  - Discussed with son. Will need to see if mental status will improve with resolution of infectious etiology  - Speech therapy evaluated and reported the patient continues not to be appropriate for by mouth intake at this time of which states they do suspect exacerbation of baseline dysphagia due to encephalopathy and concerns are present regarding ability for patient to transition to nutritional be a by mouth in near future given current level of dysphagia  Seizure disorder  -Stable, continue Keppra 1000 mg BID via NG tube -Continue Dilantin 100 mg BID via NG tube  Chronic Systolic Heart Failure  -Stable  -Continue Coreg 3.125 mg  BID   AKI  -Resolved   Hyperglycemic Hyperosmolar State  - Continue moderate SSI  - 9/1 patient failed swallow study  - Continue pivot 1.5 CAL at 12ml/hr per NG tube  - May need to discuss peg tube placement with wife should mentation not improve.   Bilateral BKA  -stable   Procedures:  8/28 CT head without contrast; Progressed left MCA territory encephalomalacia since June at which time left ICA occlusion was demonstrated by CTA.  - Stable chronic right subdural/dural thickening. No new intracranial hemorrhage  8/28 PCXR;Increased bibasilar airspace opacities;atelectasis, aspiration or pneumonia?  8/28 acute abdominal series;No evidence of obstruction. Moderate stool burden  ETT - placed in ED 8/28  PIV x2 (18 ga)  OGT - placed in ED 8/28  8/29 echocardiogram;Left ventricle: mild LVH. -LVEF= 45%- 50%.  Consultations:  None  Discharge Exam: Filed Vitals:   04/10/14 1040  BP: 113/49  Pulse: 94  Temp:   Resp:     General: Pt in nad, alert and awake Cardiovascular: rrr, no murmurs Respiratory: no increased wob, no wheezes  Discharge Instructions You were cared for by a hospitalist during your hospital stay. If you have any questions about your discharge medications or the care you received while you were in the hospital after you are discharged, you can call the unit and asked to speak with the hospitalist on call if the hospitalist that took care of you is not available. Once you are discharged, your primary care physician will handle any further medical issues. Please note that NO REFILLS for any discharge medications will be  authorized once you are discharged, as it is imperative that you return to your primary care physician (or establish a relationship with a primary care physician if you do not have one) for your aftercare needs so that they can reassess your need for medications and monitor your lab values.  Discharge Instructions   Diet - low sodium heart healthy     Complete by:  As directed      Increase activity slowly    Complete by:  As directed           Current Discharge Medication List    START taking these medications   Details  acetaminophen (TYLENOL) 160 MG/5ML solution Place 15.6 mLs (500 mg total) into feeding tube every 6 (six) hours as needed for mild pain. Qty: 120 mL, Refills: 0    aspirin 81 MG chewable tablet 1 tablet (81 mg total) by Per NG tube route daily.    chlorhexidine (PERIDEX) 0.12 % solution 15 mLs by Mouth Rinse route 2 (two) times daily. Qty: 120 mL, Refills: 0    ipratropium-albuterol (DUONEB) 0.5-2.5 (3) MG/3ML SOLN Take 3 mLs by nebulization 2 (two) times daily. Qty: 360 mL    levETIRAcetam (KEPPRA) 100 MG/ML solution Place 10 mLs (1,000 mg total) into feeding tube 2 (two) times daily. Qty: 473 mL, Refills: 12    levofloxacin (LEVAQUIN) 750 MG tablet Place 1 tablet (750 mg total) into feeding tube daily.    Nutritional Supplements (FEEDING SUPPLEMENT, PIVOT 1.5 CAL,) LIQD Place 1,000 mLs into feeding tube continuous. Refills: 0      CONTINUE these medications which have CHANGED   Details  carvedilol (COREG) 3.125 MG tablet 1 tablet (3.125 mg total) by Per NG tube route 2 (two) times daily with a meal.    clopidogrel (PLAVIX) 75 MG tablet 1 tablet (75 mg total) by Per NG tube route daily with breakfast.    escitalopram (LEXAPRO) 10 MG tablet Place 1 tablet (10 mg total) into feeding tube daily.    phenytoin (DILANTIN) 125 MG/5ML suspension Place 4 mLs (100 mg total) into feeding tube 2 (two) times daily. Qty: 237 mL, Refills: 12      CONTINUE these medications which have NOT CHANGED   Details  collagenase (SANTYL) ointment Apply 1 application topically daily. Apply to sacrum daily for pressure wound    insulin aspart (NOVOLOG) 100 UNIT/ML injection Inject 0-9 Units into the skin 4 (four) times daily -  with meals and at bedtime. 70-120: 0 121-150: 1 151-200:  201-250: 3 251-300:  5 301-350:7 351-400: 9 Greater than 400, call MD    insulin glargine (LANTUS) 100 unit/mL SOPN Inject 8 Units into the skin every morning.      STOP taking these medications     aspirin EC 81 MG EC tablet      atorvastatin (LIPITOR) 20 MG tablet      ezetimibe (ZETIA) 10 MG tablet      levETIRAcetam (KEPPRA) 1000 MG tablet      lisinopril (PRINIVIL,ZESTRIL) 2.5 MG tablet      oxybutynin (DITROPAN-XL) 5 MG 24 hr tablet      oxycodone (OXY-IR) 5 MG capsule      Tamsulosin HCl (FLOMAX) 0.4 MG CAPS      valACYclovir (VALTREX) 1000 MG tablet        Allergies  Allergen Reactions  . Ativan [Lorazepam] Other (See Comments)    Very lethargic  . Benadryl [Diphenhydramine Hcl] Other (See Comments)    Very lethargic  The results of significant diagnostics from this hospitalization (including imaging, microbiology, ancillary and laboratory) are listed below for reference.    Significant Diagnostic Studies: Dg Abd 1 View  04/09/2014   CLINICAL DATA:  Panda placement  EXAM: ABDOMEN - 1 VIEW  COMPARISON:  Abdominal radiograph dated 04/08/2014  FLUOROSCOPY TIME:  8 min 44 seconds  FINDINGS: Panda placement by the radiology technologist.  40 mL Omnipaque 300 administered via tube to confirm placement.  Weighted feeding tube terminates in the 4th portion of the duodenum, at the ligament of Treitz.  IMPRESSION: Weighted feeding tube terminates in the 4th portion of the duodenum, at the ligament of Treitz.   Electronically Signed   By: Julian Hy M.D.   On: 04/09/2014 17:02   Ct Head Wo Contrast  04/04/2014   CLINICAL DATA:  80 year old male is unresponsive. Initial encounter.  EXAM: CT HEAD WITHOUT CONTRAST  TECHNIQUE: Contiguous axial images were obtained from the base of the skull through the vertex without intravenous contrast.  COMPARISON:  CTA head 01/24/2014 and earlier.  FINDINGS: Intubated with oral enteric tube in place on the scout view.  Mildly increased paranasal  sinus mucosal thickening. No acute osseous abnormality identified. Chronic right frontal burr hole again noted. No acute orbit or scalp soft tissue findings. Calcified atherosclerosis at the skull base.  Mixed density right subdural hematoma and/or chronic dural thickening has not significantly changed. Trace leftward midline shift is stable. No ventriculomegaly. Progressed encephalomalacia in the left MCA territory, particularly in the left parietal lobe. Left ICA occlusion demonstrated on the prior. No new intracranial hemorrhage identified. No definite acute cortically based infarct Stable small chronic right cerebellar infarct. Stable chronic left PCA infarct. Stable small chronic lacunar right thalamus.  IMPRESSION: 1. No definite acute intracranial abnormality. Progressed left MCA territory encephalomalacia since June at which time left ICA occlusion was demonstrated by CTA. 2. Stable chronic right subdural/dural thickening. No new intracranial hemorrhage or significant intracranial mass effect. 3. Underlying advanced chronic ischemic disease.   Electronically Signed   By: Lars Pinks M.D.   On: 04/04/2014 18:29   Dg Chest Port 1 View  04/07/2014   CLINICAL DATA:  Pneumonia  EXAM: PORTABLE CHEST - 1 VIEW  COMPARISON:  04/06/2014  FINDINGS: Normal cardiac silhouette. Persistent bibasilar airspace disease greater on the right suggests pneumonia. Some improvement in aeration to the lung bases. No pneumothorax.  IMPRESSION: 1. Bibasilar airspace disease suggesting pneumonia. 2. Increase aeration to the lung bases.   Electronically Signed   By: Suzy Bouchard M.D.   On: 04/07/2014 07:14   Dg Chest Port 1 View  04/06/2014   CLINICAL DATA:  80 year old male intubated. Pneumonia. Initial encounter.  EXAM: PORTABLE CHEST - 1 VIEW  COMPARISON:  04/04/2014 and earlier.  FINDINGS: Portable AP semi upright view at 0533 hr. Stable endotracheal tube. Stable enteric tube. Stable cardiac size and mediastinal contours.  Lung volumes have not significantly changed. No pneumothorax or pulmonary edema. Progressed left retrocardiac opacity. Stable dense medial right lung base opacity. Ventilation elsewhere is stable.  IMPRESSION: 1.  Stable lines and tubes. 2. Interval worsening left lower lobe ventilation, which could reflect progression of multifocal pneumonia or atelectasis. Otherwise stable.   Electronically Signed   By: Lars Pinks M.D.   On: 04/06/2014 07:32   Dg Chest Portable 1 View  04/04/2014   CLINICAL DATA:  Increased oral secretions. Concern for aspiration. Sepsis.  EXAM: PORTABLE CHEST - 1 VIEW  COMPARISON:  Of 04/04/2014  FINDINGS: Endotracheal tube tip measures 5.7 cm above the carinal. Enteric tube tip is off the field of view but is below the left hemidiaphragm. Shallow inspiration. There is atelectasis or infiltration in both lung bases, demonstrating mild increase since previous study. Changes are nonspecific but compatible with aspiration in the appropriate clinical setting. No pneumothorax. Normal heart size and pulmonary vascularity.  IMPRESSION: Shallow inspiration with increasing atelectasis or infiltration in the lung bases since prior study. Appliances appear to be in satisfactory location.   Electronically Signed   By: Lucienne Capers M.D.   On: 04/04/2014 21:14   Dg Chest Portable 1 View  04/04/2014   CLINICAL DATA:  Unresponsive.  Intubated patient.  EXAM: PORTABLE CHEST - 1 VIEW  COMPARISON:  CT 02/13/2014.  Radiographs 01/24/2014.  FINDINGS: 1737 hr. There are low lung volumes with mild patient rotation to the right. Endotracheal tube tip is approximately 4 cm above the carina. A nasogastric tube projects below the diaphragm, tip not visualized. There are increased bibasilar airspace opacities. No pneumothorax or significant pleural effusion is seen. The heart size and mediastinal contours are stable.  IMPRESSION: Satisfactorily positioned endotracheal and nasogastric tubes. Increased bibasilar  airspace opacities may reflect atelectasis, aspiration or pneumonia.   Electronically Signed   By: Camie Patience M.D.   On: 04/04/2014 17:58   Dg Abd Portable 1v  04/08/2014   CLINICAL DATA:  Panda tube confirmation  EXAM: PORTABLE ABDOMEN - 1 VIEW  COMPARISON:  04/07/2014  FINDINGS: Feeding tube tip projects over the proximal stomach. Hemidiaphragms/ upper abdomen and periphery of the left hemi abdomen excluded from the field of view. Bowel gas pattern nonspecific, nonobstructive. Pelvic thermistor wire noted. Multilevel degenerative changes.  IMPRESSION: Feeding tube tip projects over the proximal stomach. Recommend advancement.   Electronically Signed   By: Carlos Levering M.D.   On: 04/08/2014 23:46   Dg Abd Portable 1v  04/07/2014   CLINICAL DATA:  Feeding tube placement.  EXAM: PORTABLE ABDOMEN - 1 VIEW  COMPARISON:  One-view abdomen 04/04/2014  FINDINGS: The tip of a small bore feeding tube is within the fundus the stomach. Bowel gas pattern is unremarkable. Bibasilar atelectasis is noted.  IMPRESSION: The tip of the feeding tube is in the fundus of the stomach.   Electronically Signed   By: Lawrence Santiago M.D.   On: 04/07/2014 13:15   Dg Abd Portable 1v  04/04/2014   CLINICAL DATA:  Unresponsive.  EXAM: PORTABLE ABDOMEN - 1 VIEW  COMPARISON:  CT 02/13/2014  FINDINGS: Mild gaseous distention of the transverse colon. Moderate stool in the colon. No evidence of bowel obstruction. No supine evidence of free air organomegaly. No acute bony abnormality. Degenerative changes in the lumbar spine and hips.  IMPRESSION: No evidence of obstruction. Moderate stool burden and gaseous distention of the colon.   Electronically Signed   By: Rolm Baptise M.D.   On: 04/04/2014 17:58   Dg Addison Bailey G Tube Plc W/fl-no Rad  04/09/2014   CLINICAL DATA: insert panda. Nursing cant place after multiple tries   NASO G TUBE PLACEMENT WITH FLUORO  Fluoroscopy was utilized by the requesting physician.  No radiographic   interpretation.    Dg Swallowing Func-speech Pathology  04/09/2014   Earle Gell McCoy, CCC-SLP     04/09/2014  9:55 AM Objective Swallowing Evaluation: Modified Barium Swallowing Study   Patient Details  Name: [name] MRN: [id number] Date of Birth: 01-01-1933  Today's Date: 04/09/2014 Time: 0925-0950 SLP Time Calculation (min): 25  min  Past Medical History:  Past Medical History  Diagnosis Date  . Diabetes mellitus   . Hypertension   . Testicular cancer dx'd 08/2010    surg only  . CHF (congestive heart failure)   . CAD (coronary artery disease)     s/p stent to OM1 remotely, DES to mRCA 2006  . Peripheral vascular disease   . Vitamin B12 deficiency   . Diabetic retinopathy   . Diabetic peripheral neuropathy   . Cholelithiasis   . Family history of colon cancer     brother   . 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 Osteomyelitis of finger of right hand 03/2013    R middle finger  . Pressure ulcer of buttock 03/2013    stage 2  . Dysphagia     pt eats a mechanical diet    Past Surgical History:  Past Surgical History  Procedure Laterality Date  . Below knee leg amputation  2005    Right  . Below knee leg amputation  2006    Left  . Appendectomy    . Eye surgery  2012    Laser  . Ptca    . Trudee Kuster hole Right 03/04/2013    Procedure: Haskell Flirt;  Surgeon: Charlie Pitter, MD;  Location:  [facility];  Service: Neurosurgery;  Laterality: Right;  Burr  Holes   . Tee without cardioversion N/A 12/20/2013    Procedure: TRANSESOPHAGEAL ECHOCARDIOGRAM (TEE);  Surgeon:  Lelon Perla, MD;  Location: [facility] END[facility];  Service:  Cardiovascular;  Laterality: N/A;   HPI:  80 yo with heart failure and multiple strokes presenting with  altered mental status, sepsis, HHS, and inability to protect his  airway. Intubated 8/28-8/30.  Evaluated by SLP 01/25/14 due to  ongoing hx of dysphagia and concerns for aspiration.  At that  time, it was recommended pt have a dysphagia 3 diet with  nectar-thick liquids.       Assessment / Plan / Recommendation Clinical  Impression  Dysphagia Diagnosis: Moderate pharyngeal phase dysphagia;Severe  oral phase dysphagia Clinical impression: MBS complete. Patient presented with intact  ability to protect airway with limited po trials provided today.  No penetration or aspiration observed however cognitively,  pateint with severely delayed oral transit of bolus and  significant delay in swallow initiation, at times with bolus  sitting in the pyriform sinuses for an excess of 4 minutes. SLP  provided max verbal, visual, and tactile cueing in attempts to  quicken initiation of swallow without success. At this time,  despite ability to protect airway during todays study, aspiration  risk high. Prognosis for ability to resume pos good with improved  mentation.     Treatment Recommendation  F/U MBS in ___ days (Comment)    Diet Recommendation NPO;Alternative means - temporary        Other  Recommendations Oral Care Recommendations:  (QD)   Follow Up Recommendations   (tba)    Frequency and Duration min 2x/week  2 weeks           General Date of Onset: 04/04/14 HPI: 80 yo with heart failure and multiple strokes presenting  with altered mental status, sepsis, HHS, and inability to protect  his airway. Intubated 8/28-8/30.  Evaluated by SLP 01/25/14 due to  ongoing hx of dysphagia and concerns for aspiration.  At that  time, it was recommended pt have a dysphagia 3 diet with  nectar-thick liquids.   Type of Study: Modified Barium Swallowing Study Reason  for Referral: Objectively evaluate swallowing function Previous Swallow Assessment: multiple prior assessments during  prior admissions Diet Prior to this Study: NPO;Panda Temperature Spikes Noted: No Respiratory Status: Nasal cannula History of Recent Intubation: Yes Length of Intubations (days): 2 days Date extubated: 04/06/14 Behavior/Cognition: Alert;Distractible;Requires cueing;Doesn't  follow directions Oral Cavity - Dentition: Edentulous Oral Motor / Sensory Function: Impaired - see Bedside  swallow  eval Self-Feeding Abilities: Needs assist Patient Positioning: Upright in chair Baseline Vocal Quality: Clear Volitional Cough: Cognitively unable to elicit Volitional Swallow: Unable to elicit Anatomy: Within functional limits Pharyngeal Secretions: Not observed secondary MBS    Reason for Referral Objectively evaluate swallowing function   Oral Phase Oral Preparation/Oral Phase Oral Phase: Impaired Oral - Nectar Oral - Nectar Teaspoon: Lingual pumping;Weak lingual  manipulation;Incomplete tongue to palate contact;Reduced  posterior propulsion;Holding of bolus;Lingual/palatal  residue;Delayed oral transit Oral - Nectar Cup: Lingual pumping;Weak lingual  manipulation;Incomplete tongue to palate contact;Reduced  posterior propulsion;Holding of bolus;Lingual/palatal  residue;Delayed oral transit Oral - Solids Oral - Puree: Lingual pumping;Weak lingual  manipulation;Incomplete tongue to palate contact;Reduced  posterior propulsion;Holding of bolus;Lingual/palatal  residue;Delayed oral transit   Pharyngeal Phase Pharyngeal Phase Pharyngeal Phase: Impaired Pharyngeal - Nectar Pharyngeal - Nectar Teaspoon: Delayed swallow  initiation;Premature spillage to pyriform sinuses Pharyngeal - Nectar Cup: Delayed swallow initiation;Premature  spillage to pyriform sinuses Pharyngeal - Solids Pharyngeal - Puree: Delayed swallow initiation;Premature spillage  to pyriform sinuses  Cervical Esophageal Phase    GO   Leah McCoy MA, CCC-SLP [phone]            McCoy Leah Meryl 04/09/2014, 9:54 AM     Microbiology: Recent Results (from the past 240 hour(s))  CULTURE, BLOOD (ROUTINE X 2)     Status: None   Collection Time    04/04/14  5:19 PM      Result Value Ref Range Status   Specimen Description BLOOD ARM RIGHT   Final   Special Requests BOTTLES DRAWN AEROBIC ONLY 3CC   Final   Culture  Setup Time     Final   Value: 04/04/2014 22:45     Performed at [facility]     Final   Value: NO GROWTH 5  DAYS     Performed at [facility]   Report Status 04/10/2014 FINAL   Final  CULTURE, BLOOD (ROUTINE X 2)     Status: None   Collection Time    04/04/14  5:25 PM      Result Value Ref Range Status   Specimen Description BLOOD ARM LEFT   Final   Special Requests BOTTLES DRAWN AEROBIC ONLY [facility]   Final   Culture  Setup Time     Final   Value: 04/04/2014 22:45     Performed at [facility]     Final   Value: NO GROWTH 5 DAYS     Performed at [facility]   Report Status 04/10/2014 FINAL   Final  URINE CULTURE     Status: None   Collection Time    04/04/14  5:52 PM      Result Value Ref Range Status   Specimen Description URINE, RANDOM   Final   Special Requests NONE   Final   Culture  Setup Time     Final   Value: 04/05/2014 01:49     Performed at [facility]     Final   Value: NO GROWTH  Performed at [facility]     Final   Value: NO GROWTH     Performed at [facility]   Report Status 04/06/2014 FINAL   Final  MRSA PCR SCREENING     Status: Abnormal   Collection Time    04/04/14  9:17 PM      Result Value Ref Range Status   MRSA by PCR POSITIVE (*) NEGATIVE Final   Comment:            The GeneXpert MRSA Assay (FDA     approved for NASAL specimens     only), is one component of a     comprehensive MRSA colonization     surveillance program. It is not     intended to diagnose MRSA     infection nor to guide or     monitor treatment for     MRSA infections.     RESULT CALLED TO, READ BACK BY AND VERIFIED WITH:     L JAMES,RN [id number] [place]: Basic Metabolic Panel:  Recent Labs Lab 04/04/14 2147  04/05/14 0610 04/06/14 0243 04/07/14 0244 04/08/14 0226 04/09/14 0700 04/10/14 0335  NA 156*  < > 154* 146 145 143 144 141  K 3.9  < > 4.0 3.6* 3.4* 3.5* 3.1* 4.8  CL 120*  < > 123* 116* 115* 112 112 104  CO2 21  < > 21 17* 18* 19 17* 24  GLUCOSE 353*  < > 155*  232* 132* 127* 146* 110*  BUN 45*  < > 38* 24* 12 11 12  31*  CREATININE 1.52*  < > 1.25 0.77 0.66 0.60 0.62 1.32  CALCIUM 7.8*  < > 7.6* 8.0* 7.9* 7.9* 7.8* 8.8  MG 2.0  --  2.0  --   --   --  1.7 2.2  PHOS 3.9  --  2.8  --   --   --   --   --   < > = values in this interval not displayed. Liver Function Tests:  Recent Labs Lab 04/04/14 2147 04/06/14 0243 04/07/14 0244 04/09/14 0700 04/10/14 0335  AST 30 20 21 16 22   ALT 25 18 16 11 15   ALKPHOS 90 74 80 74 73  BILITOT 0.5 0.3 0.4 0.4 0.4  PROT 5.8* 5.2* 5.4* 5.1* 6.0  ALBUMIN 1.7* 1.3* 1.5* 1.3* 2.9*    Recent Labs Lab 04/04/14 2147  LIPASE 9*   No results found for this basename: AMMONIA,  in the last 168 hours CBC:  Recent Labs Lab 04/04/14 1706  04/05/14 0610 04/06/14 0243 04/07/14 0244 04/09/14 0700 04/10/14 0335  WBC 15.2*  < > 10.9* 10.8* 11.0* 9.8 8.6  NEUTROABS 13.2*  --   --   --   --  7.4 6.1  HGB 15.3  < > 11.2* 11.5* 11.7* 10.5* 11.3*  HCT 47.3  < > 35.2* 35.8* 34.6* 31.2* 33.8*  MCV 103.5*  < > 101.7* 102.6* 97.2 96.9 96.6  PLT 301  < > 192 173 219 212 218  < > = values in this interval not displayed. Cardiac Enzymes:  Recent Labs Lab 04/04/14 1706  TROPONINI <0.30   BNP: BNP (last 3 results)  Recent Labs  01/24/14 2132 04/04/14 1740  PROBNP 292.1 865.8*   CBG:  Recent Labs Lab 04/09/14 1711 04/09/14 2003 04/10/14 0027 04/10/14 0413 04/10/14 0806  GLUCAP 147* 139* 175* 180* 214*     Signed:  Velvet Bathe  [facility] 04/10/2014, 11:52 AM

## 2014-04-10 NOTE — Progress Notes (Addendum)
Speech Language Pathology Treatment: Dysphagia  Patient Details Name: Knolan Simien MRN: [id number] DOB: 1933/03/21 Today's Date: 04/10/2014 Time: 0900-0930 SLP Time Calculation (min): 30 min  Assessment / Plan / Recommendation Clinical Impression  Pt today demonstrating apprehension by closing lips tightly when SLP attempted oral care with oral suction and by flinching when SLP slowly moves toward pt with inocuous items including spoon, flashlight.  Verbal and tactile reassurance provided to pt which appeared helpful.   Pt did not turn head to the left but would turn midline with max verbal and visual cues.  He did open oral cavity to accept a few SMALL ice chip boluses.  Swallow elicited only once (appeared with poor laryngeal elevation) with 3 boluses despite maximum verbal cues, spoon placement toward mouth in attempts to trigger reflexive swallow.  No s/s of aspiration noted however swallow is very inefficient and high aspiration risk is present.    Pt continues not to be appropriate for po intake at this time.  Suspect exacerbation of baseline dysphagia due to encephalopathy - concerns are present regarding ability for pt to transition to nutrition via po in near future given current level of dysphagia.    Recommend aggressive SLP and oral care to maximize swallow function return.  SLP spoke to RN and English as a second language teacher re: pt status.    HPI HPI: 80 yo with heart failure and multiple strokes presenting with altered mental status, sepsis, HHS, and inability to protect his airway. Intubated 8/28-8/30.  Evaluated by SLP 01/25/14 due to ongoing hx of dysphagia and concerns for aspiration.  At that time, it was recommended pt have a dysphagia 3 diet with nectar-thick liquids.     Pertinent Vitals Pain Assessment:  (no s/s of pain with SLP, respiratory therapy pulling pt up in bed, nor providing pt with po)  SLP Plan  Continue with current plan of care    Recommendations Diet recommendations:  NPO Medication Administration: Via alternative means              Oral Care Recommendations:  (QD) Follow up Recommendations:  (? LTAC, ? SNF) Plan: Continue with current plan of care    [place], [place], [place] [facility] SLP [phone]

## 2014-04-12 LAB — LEVETIRACETAM LEVEL: Levetiracetam Lvl: 16.1 ug/mL

## 2014-04-13 NOTE — Progress Notes (Signed)
Patient was accepted to [facility] today. CSW spoke to patient but unable to understand him due to garbled speech. Patient's son Leviathan was agreeable to d/c to [facility] and stated that his Aunt works there and can check on patient.  Ok per MD for d/c to [facility]. Transportation arranged and nursing called report. CSW signing off.  Lorie Phenix. Pauline Good, [place]

## 2014-04-17 ENCOUNTER — Ambulatory Visit: Payer: Medicare Other | Admitting: Cardiology

## 2014-06-08 DEATH — deceased
